# Patient Record
Sex: Male | Born: 1960 | Race: Black or African American | Hispanic: No | Marital: Married | Smoking: Never smoker
Health system: Southern US, Community
[De-identification: ages and names within clinical notes are randomized; demographics above are authoritative.]

## PROBLEM LIST (undated history)

## (undated) DIAGNOSIS — M1990 Unspecified osteoarthritis, unspecified site: Secondary | ICD-10-CM

## (undated) DIAGNOSIS — I428 Other cardiomyopathies: Secondary | ICD-10-CM

## (undated) DIAGNOSIS — I4891 Unspecified atrial fibrillation: Secondary | ICD-10-CM

## (undated) DIAGNOSIS — N182 Chronic kidney disease, stage 2 (mild): Secondary | ICD-10-CM

## (undated) DIAGNOSIS — F1010 Alcohol abuse, uncomplicated: Secondary | ICD-10-CM

## (undated) DIAGNOSIS — K219 Gastro-esophageal reflux disease without esophagitis: Secondary | ICD-10-CM

## (undated) DIAGNOSIS — M109 Gout, unspecified: Secondary | ICD-10-CM

## (undated) DIAGNOSIS — G459 Transient cerebral ischemic attack, unspecified: Secondary | ICD-10-CM

## (undated) HISTORY — DX: Other cardiomyopathies: I42.8

## (undated) HISTORY — DX: Unspecified atrial fibrillation: I48.91

## (undated) HISTORY — DX: Chronic kidney disease, stage 2 (mild): N18.2

---

## 1985-04-09 HISTORY — PX: OTHER SURGICAL HISTORY: SHX169

## 2011-06-14 ENCOUNTER — Inpatient Hospital Stay (HOSPITAL_COMMUNITY)
Admission: EM | Admit: 2011-06-14 | Discharge: 2011-06-19 | DRG: 308 | Disposition: A | Discharge status: Home / Self Care | Payer: MEDICAID | Attending: Internal Medicine | Admitting: Internal Medicine

## 2011-06-14 ENCOUNTER — Other Ambulatory Visit: Payer: Self-pay

## 2011-06-14 ENCOUNTER — Emergency Department (HOSPITAL_COMMUNITY): Payer: Self-pay

## 2011-06-14 ENCOUNTER — Encounter (HOSPITAL_COMMUNITY): Payer: Self-pay | Admitting: *Deleted

## 2011-06-14 DIAGNOSIS — I4891 Unspecified atrial fibrillation: Principal | ICD-10-CM | POA: Diagnosis present

## 2011-06-14 DIAGNOSIS — I359 Nonrheumatic aortic valve disorder, unspecified: Secondary | ICD-10-CM | POA: Diagnosis present

## 2011-06-14 DIAGNOSIS — Z888 Allergy status to other drugs, medicaments and biological substances status: Secondary | ICD-10-CM

## 2011-06-14 DIAGNOSIS — Z7901 Long term (current) use of anticoagulants: Secondary | ICD-10-CM

## 2011-06-14 DIAGNOSIS — Z6827 Body mass index (BMI) 27.0-27.9, adult: Secondary | ICD-10-CM

## 2011-06-14 DIAGNOSIS — I428 Other cardiomyopathies: Secondary | ICD-10-CM | POA: Diagnosis present

## 2011-06-14 DIAGNOSIS — I509 Heart failure, unspecified: Secondary | ICD-10-CM

## 2011-06-14 DIAGNOSIS — N182 Chronic kidney disease, stage 2 (mild): Secondary | ICD-10-CM | POA: Diagnosis present

## 2011-06-14 DIAGNOSIS — Z23 Encounter for immunization: Secondary | ICD-10-CM

## 2011-06-14 DIAGNOSIS — I5021 Acute systolic (congestive) heart failure: Secondary | ICD-10-CM | POA: Diagnosis present

## 2011-06-14 DIAGNOSIS — N289 Disorder of kidney and ureter, unspecified: Secondary | ICD-10-CM | POA: Diagnosis present

## 2011-06-14 DIAGNOSIS — Z7902 Long term (current) use of antithrombotics/antiplatelets: Secondary | ICD-10-CM

## 2011-06-14 DIAGNOSIS — F1010 Alcohol abuse, uncomplicated: Secondary | ICD-10-CM

## 2011-06-14 DIAGNOSIS — G459 Transient cerebral ischemic attack, unspecified: Secondary | ICD-10-CM | POA: Diagnosis present

## 2011-06-14 DIAGNOSIS — Z79899 Other long term (current) drug therapy: Secondary | ICD-10-CM

## 2011-06-14 DIAGNOSIS — J90 Pleural effusion, not elsewhere classified: Secondary | ICD-10-CM | POA: Diagnosis present

## 2011-06-14 HISTORY — DX: Alcohol abuse, uncomplicated: F10.10

## 2011-06-14 HISTORY — DX: Transient cerebral ischemic attack, unspecified: G45.9

## 2011-06-14 LAB — COMPREHENSIVE METABOLIC PANEL
ALT: 30 U/L (ref 0–53)
AST: 27 U/L (ref 0–37)
Albumin: 3.7 g/dL (ref 3.5–5.2)
Alkaline Phosphatase: 76 U/L (ref 39–117)
BUN: 23 mg/dL (ref 6–23)
CO2: 20 mEq/L (ref 19–32)
Calcium: 9.4 mg/dL (ref 8.4–10.5)
Chloride: 100 mEq/L (ref 96–112)
Creatinine, Ser: 1.57 mg/dL — ABNORMAL HIGH (ref 0.50–1.35)
GFR calc Af Amer: 58 mL/min — ABNORMAL LOW (ref >90)
GFR calc non Af Amer: 50 mL/min — ABNORMAL LOW (ref >90)
Glucose, Bld: 100 mg/dL — ABNORMAL HIGH (ref 70–99)
Potassium: 3.9 mEq/L (ref 3.5–5.1)
Sodium: 137 mEq/L (ref 135–145)
Total Bilirubin: 0.8 mg/dL (ref 0.3–1.2)
Total Protein: 7.3 g/dL (ref 6.0–8.3)

## 2011-06-14 LAB — DIFFERENTIAL
Basophils Absolute: 0.1 10*3/uL (ref 0.0–0.1)
Basophils Relative: 1 % (ref 0–1)
Eosinophils Absolute: 0.1 10*3/uL (ref 0.0–0.7)
Eosinophils Relative: 1 % (ref 0–5)
Lymphocytes Relative: 42 % (ref 12–46)
Lymphs Abs: 3.9 10*3/uL (ref 0.7–4.0)
Monocytes Absolute: 0.9 10*3/uL (ref 0.1–1.0)
Monocytes Relative: 10 % (ref 3–12)
Neutro Abs: 4.3 10*3/uL (ref 1.7–7.7)
Neutrophils Relative %: 46 % (ref 43–77)

## 2011-06-14 LAB — CBC
HCT: 44.8 % (ref 39.0–52.0)
Hemoglobin: 15.4 g/dL (ref 13.0–17.0)
MCH: 31.6 pg (ref 26.0–34.0)
MCHC: 34.4 g/dL (ref 30.0–36.0)
MCV: 91.8 fL (ref 78.0–100.0)
Platelets: 280 10*3/uL (ref 150–400)
RBC: 4.88 MIL/uL (ref 4.22–5.81)
RDW: 12.9 % (ref 11.5–15.5)
WBC: 9.2 10*3/uL (ref 4.0–10.5)

## 2011-06-14 LAB — PRO B NATRIURETIC PEPTIDE: Pro B Natriuretic peptide (BNP): 6596 pg/mL — ABNORMAL HIGH (ref 0–125)

## 2011-06-14 LAB — TROPONIN I: Troponin I: <0.3 ng/mL (ref <0.30)

## 2011-06-14 MED ORDER — DEXTROSE 5 % IV SOLN
500.0000 mg | Freq: Once | INTRAVENOUS | Status: AC
  Administered 2011-06-14: 500 mg via INTRAVENOUS
  Filled 2011-06-14: qty 500

## 2011-06-14 MED ORDER — DILTIAZEM HCL 100 MG IV SOLR
5.0000 mg/h | INTRAVENOUS | Status: DC
  Administered 2011-06-14: 5 mg/h via INTRAVENOUS

## 2011-06-14 MED ORDER — DILTIAZEM HCL 25 MG/5ML IV SOLN
INTRAVENOUS | Status: AC
  Filled 2011-06-14: qty 5

## 2011-06-14 MED ORDER — DEXTROSE 5 % IV SOLN
1.0000 g | Freq: Once | INTRAVENOUS | Status: AC
  Administered 2011-06-14: 1 g via INTRAVENOUS

## 2011-06-14 MED ORDER — DILTIAZEM HCL 50 MG/10ML IV SOLN
20.0000 mg | Freq: Once | INTRAVENOUS | Status: AC
  Administered 2011-06-14: 19:00:00 via INTRAVENOUS

## 2011-06-14 MED ORDER — DEXTROSE 5 % IV SOLN
INTRAVENOUS | Status: AC
  Filled 2011-06-14: qty 10

## 2011-06-14 NOTE — ED Notes (Signed)
Pt reported itching during the infusion of azithromycin. IV was stopped.

## 2011-06-14 NOTE — ED Notes (Signed)
CP x 1 week, numbness to right arm 3 days ago, productive cough, SOB, ankle swelling that started today

## 2011-06-14 NOTE — ED Notes (Signed)
Pt states itching stopped since zithromax was stopped.

## 2011-06-14 NOTE — ED Provider Notes (Addendum)
History  This chart was scribed for [name], MD by Bennett Scrape. This patient was seen in room [facility] and the patient's care was started at 6:52PM.  CSN: [id number]  Arrival date & time 06/14/11  6213   First MD Initiated Contact with Patient 06/14/11 1849      Chief Complaint  Patient presents with  . Chest Pain    Patient is a 51 y.o. male presenting with chest pain. The history is provided by the patient. No language interpreter was used.  Chest Pain The chest pain began 5 - 7 days ago. Chest pain occurs constantly. The chest pain is unchanged. The pain is associated with coughing. The quality of the pain is described as aching. The pain does not radiate. Chest pain is worsened by deep breathing. Primary symptoms include shortness of breath, cough and palpitations. Pertinent negatives for primary symptoms include no fever, no fatigue, no wheezing, no abdominal pain, no nausea, no vomiting and no dizziness.  The palpitations also occurred with shortness of breath. The palpitations did not occur with dizziness.  Associated symptoms include lower extremity edema and numbness.  Pertinent negatives for associated symptoms include no diaphoresis and no weakness. He tried nothing for the symptoms.  Pertinent negatives for past medical history include no CAD, no diabetes, no MI, no PE and no seizures.  Pertinent negatives for family medical history include: no CAD in family, no diabetes in family, no heart disease in family, no early MI in family and no PE in family.     David [name] is a 51 y.o. male who presents to the Emergency Department complaining of one week of gradual onset, gradually worsening, constant, non-radiating chest pain. Pt stated that it was originally coming from his productive cough of yellow and green sputum, because coughing make his chest and back hurt. He then became worried when he had a sudden onset SOB and palpitations 4 to 5 days ago and sudden onset  of bilateral ankle swelling this morning. He also reports concern over one episode of numbness to right arm and fingers that resolved on it's own after one hour 3 days ago. He reports that he has been taking night quil and equate for the cough with no improvement in symptoms. He denies any previous episodes of similar symptoms. He has no h/o chronic medical conditions. He is a daily alcohol user but denies smoking use.  Pt has no PCP.  History reviewed. No pertinent past medical history.  Past Surgical History  Procedure Date  . Knee surgery     History reviewed. No pertinent family history.  History  Substance Use Topics  . Smoking status: Never Smoker   . Smokeless tobacco: Not on file  . Alcohol Use: Yes     daily-beer      Review of Systems  Constitutional: Negative for fever, diaphoresis and fatigue.  HENT: Negative for congestion, sinus pressure and ear discharge.   Eyes: Negative for discharge.  Respiratory: Positive for cough and shortness of breath. Negative for wheezing.   Cardiovascular: Positive for chest pain and palpitations.  Gastrointestinal: Negative for nausea, vomiting, abdominal pain and diarrhea.  Genitourinary: Negative for frequency and hematuria.  Musculoskeletal: Negative for back pain.  Skin: Negative for rash.  Neurological: Positive for numbness. Negative for dizziness, seizures, weakness and headaches.  Hematological: Negative.   Psychiatric/Behavioral: Negative for hallucinations.    Allergies  Review of patient's allergies indicates not on file.  Home Medications  No current outpatient prescriptions  on file.  Triage Vitals: BP 166/120  Pulse 127  Temp(Src) 97.8 F (36.6 C) (Axillary)  Resp 22  Ht 5\' 11"  (1.803 m)  Wt 200 lb (90.719 kg)  BMI 27.89 kg/m2  SpO2 98%  Physical Exam  Nursing note and vitals reviewed. Constitutional: He is oriented to person, place, and time. He appears well-developed and well-nourished.  HENT:  Head:  Normocephalic and atraumatic.  Eyes: Conjunctivae and EOM are normal. No scleral icterus.  Neck: Neck supple. No thyromegaly present.  Cardiovascular: An irregular rhythm present. Tachycardia present.  Exam reveals no gallop and no friction rub.   No murmur heard. Pulmonary/Chest: Effort normal and breath sounds normal. No stridor. No respiratory distress. He has no wheezes. He has no rales. He exhibits no tenderness.  Abdominal: Soft. He exhibits no distension. There is no tenderness. There is no rebound.  Musculoskeletal: Normal range of motion. He exhibits edema (1+ edema in bilateral ankles).  Lymphadenopathy:    He has no cervical adenopathy.  Neurological: He is alert and oriented to person, place, and time. Coordination normal.  Skin: Skin is warm and dry. No rash noted. No erythema.  Psychiatric: He has a normal mood and affect. His behavior is normal.    ED Course  Procedures (including critical care time)  DIAGNOSTIC STUDIES: Oxygen Saturation is 98% on room air, normal by my interpretation.    COORDINATION OF CARE: 6:58Pm-Discussed treatment plan with pt and pt agreed to plan.    Labs Reviewed  COMPREHENSIVE METABOLIC PANEL - Abnormal; Notable for the following:    Glucose, Bld 100 (*)    Creatinine, Ser 1.57 (*)    GFR calc non Af Amer 50 (*)    GFR calc Af Amer 58 (*)    All other components within normal limits  CBC  DIFFERENTIAL  TROPONIN I  PRO B NATRIURETIC PEPTIDE    Dg Chest Port 1 View  06/14/2011  *RADIOLOGY REPORT*  Clinical Data: Chest pain for 1 week.  PORTABLE CHEST - 1 VIEW  Comparison: None.  Findings: 1913 hours.  There are low lung volumes with patchy bibasilar air space opacities.  There may be small bilateral pleural effusions.  No pneumothorax is evident.  Heart size and mediastinal contours are normal for the degree of inspiration.  No acute osseous findings are seen.  Telemetry leads overlie the chest.  IMPRESSION: Patchy bibasilar air space  opacities may reflect atelectasis, although are concerning for possible pneumonia.  Correlate clinically.  There may be small bilateral pleural effusions.  Original Report Authenticated By: Gerrianne Scale, M.D.     No diagnosis found.    MDM  Atrial fib   .[id number]  Date: 06/14/2011  Rate:135  Rhythm: atrial fibrillation  QRS Axis: normal  Intervals: normal  ST/T Wave abnormalities: nonspecific ST changes  Conduction Disutrbances:none  Narrative Interpretation:   Old EKG Reviewed: none available     The chart was scribed for me under my direct supervision.  I personally performed the history, physical, and medical decision making and all procedures in the evaluation of this patient.[name], MD 06/14/11 2130  [name], MD 06/14/11 2227

## 2011-06-15 ENCOUNTER — Inpatient Hospital Stay (HOSPITAL_COMMUNITY): Payer: Self-pay

## 2011-06-15 ENCOUNTER — Encounter (HOSPITAL_COMMUNITY): Payer: Self-pay | Admitting: Intensive Care

## 2011-06-15 DIAGNOSIS — N289 Disorder of kidney and ureter, unspecified: Secondary | ICD-10-CM

## 2011-06-15 DIAGNOSIS — I428 Other cardiomyopathies: Secondary | ICD-10-CM | POA: Diagnosis present

## 2011-06-15 DIAGNOSIS — I509 Heart failure, unspecified: Secondary | ICD-10-CM

## 2011-06-15 DIAGNOSIS — G459 Transient cerebral ischemic attack, unspecified: Secondary | ICD-10-CM | POA: Diagnosis present

## 2011-06-15 DIAGNOSIS — I4891 Unspecified atrial fibrillation: Principal | ICD-10-CM

## 2011-06-15 DIAGNOSIS — F1010 Alcohol abuse, uncomplicated: Secondary | ICD-10-CM

## 2011-06-15 DIAGNOSIS — I359 Nonrheumatic aortic valve disorder, unspecified: Secondary | ICD-10-CM

## 2011-06-15 LAB — PROTIME-INR
INR: 1.36 (ref 0.00–1.49)
Prothrombin Time: 17 seconds — ABNORMAL HIGH (ref 11.6–15.2)

## 2011-06-15 LAB — RAPID URINE DRUG SCREEN, HOSP PERFORMED
Amphetamines: NOT DETECTED
Barbiturates: NOT DETECTED
Benzodiazepines: NOT DETECTED
Cocaine: NOT DETECTED
Opiates: NOT DETECTED
Tetrahydrocannabinol: NOT DETECTED

## 2011-06-15 LAB — CARDIAC PANEL(CRET KIN+CKTOT+MB+TROPI)
CK, MB: 4.5 ng/mL — ABNORMAL HIGH (ref 0.3–4.0)
CK, MB: 4.5 ng/mL — ABNORMAL HIGH (ref 0.3–4.0)
CK, MB: 4.3 ng/mL — ABNORMAL HIGH (ref 0.3–4.0)
Relative Index: 3.1 — ABNORMAL HIGH (ref 0.0–2.5)
Relative Index: 2.7 — ABNORMAL HIGH (ref 0.0–2.5)
Relative Index: 2.6 — ABNORMAL HIGH (ref 0.0–2.5)
Total CK: 144 U/L (ref 7–232)
Total CK: 167 U/L (ref 7–232)
Total CK: 166 U/L (ref 7–232)
Troponin I: <0.3 ng/mL (ref <0.30)
Troponin I: <0.3 ng/mL (ref <0.30)
Troponin I: 0.32 ng/mL (ref <0.30)

## 2011-06-15 LAB — CBC
HCT: 42 % (ref 39.0–52.0)
Hemoglobin: 14.3 g/dL (ref 13.0–17.0)
MCH: 31 pg (ref 26.0–34.0)
MCHC: 34 g/dL (ref 30.0–36.0)
MCV: 91.1 fL (ref 78.0–100.0)
Platelets: 260 10*3/uL (ref 150–400)
RBC: 4.61 MIL/uL (ref 4.22–5.81)
RDW: 12.9 % (ref 11.5–15.5)
WBC: 10.2 10*3/uL (ref 4.0–10.5)

## 2011-06-15 LAB — LIPID PANEL
Cholesterol: 129 mg/dL (ref 0–200)
HDL: 45 mg/dL (ref >39)
LDL Cholesterol: 71 mg/dL (ref 0–99)
Total CHOL/HDL Ratio: 2.9 RATIO
Triglycerides: 67 mg/dL (ref <150)
VLDL: 13 mg/dL (ref 0–40)

## 2011-06-15 LAB — MAGNESIUM: Magnesium: 1.6 mg/dL (ref 1.5–2.5)

## 2011-06-15 LAB — T4, FREE: Free T4: 1.54 ng/dL (ref 0.80–1.80)

## 2011-06-15 LAB — TSH: TSH: 1.705 u[IU]/mL (ref 0.350–4.500)

## 2011-06-15 LAB — MRSA PCR SCREENING: MRSA by PCR: NEGATIVE

## 2011-06-15 MED ORDER — POTASSIUM CHLORIDE CRYS ER 20 MEQ PO TBCR
40.0000 meq | EXTENDED_RELEASE_TABLET | Freq: Two times a day (BID) | ORAL | Status: DC
  Administered 2011-06-15 – 2011-06-17 (×6): 40 meq via ORAL
  Filled 2011-06-15 (×7): qty 2

## 2011-06-15 MED ORDER — DILTIAZEM HCL 60 MG PO TABS
60.0000 mg | ORAL_TABLET | Freq: Two times a day (BID) | ORAL | Status: DC
  Administered 2011-06-15: 60 mg via ORAL
  Filled 2011-06-15: qty 1

## 2011-06-15 MED ORDER — FUROSEMIDE 10 MG/ML IJ SOLN
40.0000 mg | Freq: Two times a day (BID) | INTRAMUSCULAR | Status: DC
  Administered 2011-06-15 (×2): 40 mg via INTRAVENOUS
  Filled 2011-06-15 (×2): qty 4

## 2011-06-15 MED ORDER — ONDANSETRON HCL 4 MG/2ML IJ SOLN: 4.0000 mg | INTRAMUSCULAR | Status: DC | PRN

## 2011-06-15 MED ORDER — WARFARIN - PHARMACIST DOSING INPATIENT: Freq: Every day | Status: DC

## 2011-06-15 MED ORDER — VITAMIN B-1 100 MG PO TABS
100.0000 mg | ORAL_TABLET | Freq: Every day | ORAL | Status: DC
  Administered 2011-06-15 – 2011-06-19 (×5): 100 mg via ORAL
  Filled 2011-06-15 (×5): qty 1

## 2011-06-15 MED ORDER — ENOXAPARIN SODIUM 100 MG/ML ~~LOC~~ SOLN
1.0000 mg/kg | Freq: Two times a day (BID) | SUBCUTANEOUS | Status: DC
  Administered 2011-06-15 – 2011-06-19 (×9): 90 mg via SUBCUTANEOUS
  Filled 2011-06-15 (×13): qty 1

## 2011-06-15 MED ORDER — WARFARIN SODIUM 7.5 MG PO TABS
7.5000 mg | ORAL_TABLET | Freq: Once | ORAL | Status: AC
  Administered 2011-06-15: 7.5 mg via ORAL
  Filled 2011-06-15: qty 1

## 2011-06-15 MED ORDER — MAGNESIUM OXIDE 400 MG PO TABS
400.0000 mg | ORAL_TABLET | Freq: Every day | ORAL | Status: DC
  Administered 2011-06-15 – 2011-06-19 (×5): 400 mg via ORAL
  Filled 2011-06-15 (×5): qty 1

## 2011-06-15 MED ORDER — PNEUMOCOCCAL VAC POLYVALENT 25 MCG/0.5ML IJ INJ
0.5000 mL | INJECTION | INTRAMUSCULAR | Status: AC
  Administered 2011-06-16: 0.5 mL via INTRAMUSCULAR
  Filled 2011-06-15: qty 0.5

## 2011-06-15 MED ORDER — ADULT MULTIVITAMIN W/MINERALS CH
1.0000 | ORAL_TABLET | Freq: Every day | ORAL | Status: DC
  Administered 2011-06-15 – 2011-06-19 (×5): 1 via ORAL
  Filled 2011-06-15 (×5): qty 1

## 2011-06-15 MED ORDER — WARFARIN VIDEO
Freq: Once | Status: AC
  Administered 2011-06-15: 14:00:00

## 2011-06-15 MED ORDER — ASPIRIN EC 81 MG PO TBEC: 81.0000 mg | DELAYED_RELEASE_TABLET | Freq: Every day | ORAL | Status: DC

## 2011-06-15 MED ORDER — LORAZEPAM 2 MG/ML IJ SOLN: 1.0000 mg | Freq: Four times a day (QID) | INTRAMUSCULAR | Status: DC | PRN

## 2011-06-15 MED ORDER — ACETAMINOPHEN 325 MG PO TABS: 650.0000 mg | ORAL_TABLET | ORAL | Status: DC | PRN

## 2011-06-15 MED ORDER — DILTIAZEM HCL 30 MG PO TABS: 30.0000 mg | ORAL_TABLET | Freq: Four times a day (QID) | ORAL | Status: DC

## 2011-06-15 MED ORDER — WARFARIN SODIUM 7.5 MG PO TABS: 7.5000 mg | ORAL_TABLET | Freq: Once | ORAL | Status: DC

## 2011-06-15 MED ORDER — COUMADIN BOOK
Freq: Once | Status: AC
  Administered 2011-06-15: 10:00:00
  Filled 2011-06-15: qty 1

## 2011-06-15 MED ORDER — CARVEDILOL 3.125 MG PO TABS
6.2500 mg | ORAL_TABLET | Freq: Two times a day (BID) | ORAL | Status: DC
  Administered 2011-06-15 – 2011-06-18 (×6): 6.25 mg via ORAL
  Filled 2011-06-15 (×6): qty 2

## 2011-06-15 MED ORDER — FUROSEMIDE 10 MG/ML IJ SOLN
40.0000 mg | Freq: Every day | INTRAMUSCULAR | Status: DC
  Administered 2011-06-16 – 2011-06-19 (×4): 40 mg via INTRAVENOUS
  Filled 2011-06-15 (×4): qty 4

## 2011-06-15 MED ORDER — LISINOPRIL 5 MG PO TABS
2.5000 mg | ORAL_TABLET | Freq: Every day | ORAL | Status: DC
  Administered 2011-06-16 – 2011-06-19 (×4): 2.5 mg via ORAL
  Filled 2011-06-15 (×6): qty 1

## 2011-06-15 NOTE — H&P (Signed)
PCP:   No primary provider on file.   Chief Complaint:  Progressive shortness of breath x 5 days  HPI: David [name] is an 51 y.o. male.   Denies chronic medical problems denies medical followup for the past 25 years; admits to a 40 ounce of beer daily, NyQuil when necessary when he has flulike-symptoms.  Developed what he felt was a chest cold with coughing and back pain about a 10 days ago, self medicated with NyQuil but it did not really improve; cough is productive of yellow-green sputum; I 5 days ago he started having shortness of breath when lying flat in bed, relieved by sitting up, recurring again whenever he went back to sleep. 3 days ago she had an episode of complete numbness of his right upper extremity, drooling from his mouth when he tried to drink water. Episode lasted about 5 minutes. He was not alone and did not try to assess his speech or try to further assess her swallowing. Had no choking no blurring of vision vision normal, no fall and no weakness of his legs This morning he noted for the first time significant swelling of his lower extremities.  Is noted episodic palpitations for the past 5 days.  Denies any fever  Continued marijuana and cocaine use about 8 years ago.  Patient came to the emergency room today was noted to be in A. fib with RVR, and started on a Cardizem drip.  Rewiew of Systems:  The patient denies anorexia, fever, weight loss,, vision loss, decreased hearing, hoarseness, chest pain, syncope, dyspnea on exertion, peripheral edema, balance deficits, hemoptysis, abdominal pain, melena, hematochezia, severe indigestion/heartburn, hematuria, incontinence, genital sores, muscle weakness, suspicious skin lesions, transient blindness, difficulty walking, depression, unusual weight change, abnormal bleeding, enlarged lymph nodes, angioedema, and breast masses.   History reviewed. No pertinent past medical history.  Past Surgical History  Procedure Date    . Knee surgery   . Knee surgery 1987    arthroscopic    Medications:  HOME MEDS: Prior to Admission medications   Not on File     Allergies:  Allergies  Allergen Reactions  . Azithromycin Itching    Reaction in ED to administration of azithromycin. Patient stated he started itching.    Social History:   reports that he has never smoked. He does not have any smokeless tobacco history on file. He reports that he drinks alcohol. He reports that he uses illicit drugs (Marijuana).  Family History: Family History  Problem Relation Age of Onset  . Hypertension Mother   . Hypertension Sister   . Hypertension Brother      Physical Exam: Filed Vitals:   06/14/11 2000 06/14/11 2100 06/14/11 2200 06/14/11 2343  BP: 144/95 115/82 137/98 110/87  Pulse: 64 73 73 97  Temp:    97.6 F (36.4 C)  TempSrc:    Oral  Resp: 25   20  Height:    5\' 11"  (1.803 m)  Weight:    88.8 kg (195 lb 12.3 oz)  SpO2: 99% 99% 98% 99%   Blood pressure 110/87, pulse 97, temperature 97.6 F (36.4 C), temperature source Oral, resp. rate 20, height 5\' 11"  (1.803 m), weight 88.8 kg (195 lb 12.3 oz), SpO2 99.00%.  GEN:  Pleasant middle-aged African American gentleman lying in the stretcher in no acute distress; cooperative with exam PSYCH:  alert and oriented x4; does  appear anxious; does not appear depressed; affect is appropriate HEENT: Mucous membranes pink and anicteric; PERRLA; EOM  intact; no cervical lymphadenopathy nor thyromegaly or carotid bruit; markedly distended JVD and external jugular veins. Breasts:: Not examined CHEST WALL: No tenderness CHEST: Normal respiration, bibasilar crackles HEART: Regularly regular rhythm no murmurs . BACK: No kyphosis or scoliosis; no CVA tenderness ABDOMEN: Obese, soft non-tender; no masses, no organomegaly, normal abdominal bowel sounds; no pannus; no intertriginous candida. Rectal Exam: Not done EXTREMITIES:   arthropathy of the hands and knees; 1+ edema  to mid lower leg. Genitalia: not examined PULSES: 2+ and symmetric SKIN: Normal hydration no rash or ulceration CNS: Cranial nerves 2-12 grossly intact no focal or lateralizing neurologic deficit   Labs & Imaging Results for orders placed during the hospital encounter of 06/14/11 (from the past 48 hour(s))  CBC     Status: Normal   Collection Time   06/14/11  7:24 PM      Component Value Range Comment   WBC 9.2  4.0 - 10.5 (K/uL)    RBC 4.88  4.22 - 5.81 (MIL/uL)    Hemoglobin 15.4  13.0 - 17.0 (g/dL)    HCT 40.9  81.1 - 91.4 (%)    MCV 91.8  78.0 - 100.0 (fL)    MCH 31.6  26.0 - 34.0 (pg)    MCHC 34.4  30.0 - 36.0 (g/dL)    RDW 78.2  95.6 - 21.3 (%)    Platelets 280  150 - 400 (K/uL)   DIFFERENTIAL     Status: Normal   Collection Time   06/14/11  7:24 PM      Component Value Range Comment   Neutrophils Relative 46  43 - 77 (%)    Neutro Abs 4.3  1.7 - 7.7 (K/uL)    Lymphocytes Relative 42  12 - 46 (%)    Lymphs Abs 3.9  0.7 - 4.0 (K/uL)    Monocytes Relative 10  3 - 12 (%)    Monocytes Absolute 0.9  0.1 - 1.0 (K/uL)    Eosinophils Relative 1  0 - 5 (%)    Eosinophils Absolute 0.1  0.0 - 0.7 (K/uL)    Basophils Relative 1  0 - 1 (%)    Basophils Absolute 0.1  0.0 - 0.1 (K/uL)   COMPREHENSIVE METABOLIC PANEL     Status: Abnormal   Collection Time   06/14/11  7:24 PM      Component Value Range Comment   Sodium 137  135 - 145 (mEq/L)    Potassium 3.9  3.5 - 5.1 (mEq/L)    Chloride 100  96 - 112 (mEq/L)    CO2 20  19 - 32 (mEq/L)    Glucose, Bld 100 (*) 70 - 99 (mg/dL)    BUN 23  6 - 23 (mg/dL)    Creatinine, Ser 0.86 (*) 0.50 - 1.35 (mg/dL)    Calcium 9.4  8.4 - 10.5 (mg/dL)    Total Protein 7.3  6.0 - 8.3 (g/dL)    Albumin 3.7  3.5 - 5.2 (g/dL)    AST 27  0 - 37 (U/L)    ALT 30  0 - 53 (U/L)    Alkaline Phosphatase 76  39 - 117 (U/L)    Total Bilirubin 0.8  0.3 - 1.2 (mg/dL)    GFR calc non Af Amer 50 (*) >90 (mL/min)    GFR calc Af Amer 58 (*) >90 (mL/min)   TROPONIN I      Status: Normal   Collection Time   06/14/11  7:24 PM  Component Value Range Comment   Troponin I <0.30  <0.30 (ng/mL)   PRO B NATRIURETIC PEPTIDE     Status: Abnormal   Collection Time   06/14/11  7:45 PM      Component Value Range Comment   Pro B Natriuretic peptide (BNP) 6596.0 (*) 0 - 125 (pg/mL)    Dg Chest Port 1 View  06/14/2011  *RADIOLOGY REPORT*  Clinical Data: Chest pain for 1 week.  PORTABLE CHEST - 1 VIEW  Comparison: None.  Findings: 1913 hours.  There are low lung volumes with patchy bibasilar air space opacities.  There may be small bilateral pleural effusions.  No pneumothorax is evident.  Heart size and mediastinal contours are normal for the degree of inspiration.  No acute osseous findings are seen.  Telemetry leads overlie the chest.  IMPRESSION: Patchy bibasilar air space opacities may reflect atelectasis, although are concerning for possible pneumonia.  Correlate clinically.  There may be small bilateral pleural effusions.  Original Report Authenticated By: Gerrianne Scale, M.D.      Assessment Present on Admission:  .Atrial fibrillation, new onset .CHF (congestive heart failure) .Alcohol abuse .TIA (transient ischemic attack)   PLAN: We'll start this gentleman on full anti-coagulation, continue Cardizem drip, 2-D echo, a thyroid  function, consult cardiology.  Will withhold antibiotics for now clinically does not seem to have a pneumonia this is more likely to be failure; give diuretics, but will withhold inhibitors for the time being until, no function improves.  CT scan of the head to rule out evidence of infarct.  Consider alcohol withdrawal protocol. Other plans as per orders.    Zayleigh Stroh 06/15/2011, 1:04 AM

## 2011-06-15 NOTE — Progress Notes (Signed)
*  PRELIMINARY RESULTS* Echocardiogram 2D Echocardiogram has been performed.  Conrad [place] 06/15/2011, 1:51 PM

## 2011-06-15 NOTE — Progress Notes (Signed)
Pt and his family watching pt ed video on coumadin and blood thinners

## 2011-06-15 NOTE — Consult Note (Signed)
CARDIOLOGY CONSULT NOTE  Patient ID: David [name] MRN: [id number] DOB/AGE: 12/07/1960 51 y.o.  Admit date: 06/14/2011 Referring Physician: PTH Primary PhysicianNo primary provider on file. Primary Cardiologist: New Reason for Consultation: New Onset Afib and CHF Active Problems:  Atrial fibrillation, new onset  CHF (congestive heart failure)  Alcohol abuse  TIA (transient ischemic attack)  Renal insufficiency  HPI: David [name] is a 51 year old male patient with no prior cardiac history, has not been seen by a physician in over 20 years. Presented to any pain emergency room with one-week complaint of shortness of breath, coughing, and congestion. This became progressive with associated PND and orthopnea. 3 days ago. He had a transient episode of right arm numbness and tingling along with some facial drooping on the right side of his mouth. Day of admission he noticed that his lower extremities had become swollen and cool. He came to the emergency room for evaluation and was found to be in atrial fibrillation with RVR. Heart rate was 120s 70s minute with a blood pressure 166/120. He was afebrile. The chest x-ray revealed patchy bibasilar airspace opacities may reflect atelectasis as are concerning for possible pneumonia. He was given a diltiazem bolus of 20 mg and started on a drip of 5 mg an hour. He was also started on Rocephin and Zithromax. ProBNP was elevated at 6596. He was given IV Lasix 40 mg times one and his diuresis. Approximately 2000 cc. He remains on IV Lasix 40 mg every 12 hours. He is also on Lovenox twice a day. He could not tell that his heart rate was irregular or how long he had been in an irregular heart rhythm. His main complaint was progressive shortness of breath, cough, and lower extremity edema. He has a history of alcohol abuse and is being treated prophylactically with thiamine, magnesium, and multivitamins. Echocardiogram has been ordered.  Review of systems complete and  found to be negative unless listed above   History reviewed. No pertinent past medical history.  Family History  Problem Relation Age of Onset  . Hypertension Mother   . Hypertension Sister   . Hypertension Brother     History   Social History  . Marital Status: Single    Spouse Name: N/A    Number of Children: N/A  . Years of Education: N/A   Occupational History  . Not on file.   Social History Main Topics  . Smoking status: Never Smoker   . Smokeless tobacco: Not on file  . Alcohol Use: Yes     daily-beer  . Drug Use: Yes    Special: Marijuana     last used on birthday  . Sexually Active: Yes    Birth Control/ Protection: None   Other Topics Concern  . Not on file   Social History Narrative   [place] with his sisterLaid off from [employer].    Past Surgical History  Procedure Date  . Knee surgery   . Knee surgery 1987    arthroscopic     No prescriptions prior to admission    Physical Exam: Blood pressure 151/98, pulse 88, temperature 98.2 F (36.8 C), temperature source Oral, resp. rate 32, height 5\' 11"  (1.803 m), weight 195 lb 12.3 oz (88.8 kg), SpO2 97.00%.    General: Well developed, well nourished, in no acute distress Head: Eyes PERRLA, No xanthomas.   Normal cephalic and atramatic  Lungs: Clear bilaterally to auscultation and percussion.No wheezes or crackles. Heart: HRIR S1 S2, .  Pulses are 2+ & equal.            No carotid bruit. Mild  JVD.  No abdominal bruits. No femoral bruits. Abdomen: Bowel sounds are positive, abdomen soft and non-tender without masses or                  Hernia's noted. Msk:  Back normal, normal gait. Normal strength and tone for age. Extremities: No clubbing, cyanosis or edema.  DP diminished on the right, 1+ on the left. Neuro: Alert and oriented X 3. Psych:  Good affect, responds appropriately Labs:   Lab Results  Component Value Date   WBC 9.2 06/14/2011   HGB 15.4 06/14/2011   HCT 44.8 06/14/2011    MCV 91.8 06/14/2011   PLT 280 06/14/2011     Lab 06/14/11 1924  NA 137  K 3.9  CL 100  CO2 20  BUN 23  CREATININE 1.57*  CALCIUM 9.4  PROT 7.3  BILITOT 0.8  ALKPHOS 76  ALT 30  AST 27  GLUCOSE 100*   Lab Results  Component Value Date   TROPONINI <0.30 06/14/2011     Pro BNP: 6596.0   Radiology: Ct Head Wo Contrast  06/15/2011  *RADIOLOGY REPORT*  Clinical Data: TIA, lost feeling in right side for approximately 5 minutes 3 days ago, history atrial fibrillation  CT HEAD WITHOUT CONTRAST  Technique:  Contiguous axial images were obtained from the base of the skull through the vertex without contrast.  Comparison: None.  Findings: Normal ventricular morphology. No midline shift or mass effect. Normal appearance of brain parenchyma. No intracranial hemorrhage, mass lesion, or acute infarction. Visualized paranasal sinuses and mastoid air cells clear. Bones unremarkable.  IMPRESSION: Normal exam.  Original Report Authenticated By: Lollie Marrow, M.D.   Dg Chest Port 1 View  06/14/2011  *RADIOLOGY REPORT*  Clinical Data: Chest pain for 1 week.  PORTABLE CHEST - 1 VIEW  Comparison: None.  Findings: 1913 hours.  There are low lung volumes with patchy bibasilar air space opacities.  There may be small bilateral pleural effusions.  No pneumothorax is evident.  Heart size and mediastinal contours are normal for the degree of inspiration.  No acute osseous findings are seen.  Telemetry leads overlie the chest.  IMPRESSION: Patchy bibasilar air space opacities may reflect atelectasis, although are concerning for possible pneumonia.  Correlate clinically.  There may be small bilateral pleural effusions.  Original Report Authenticated By: Gerrianne Scale, M.D.   ZOX:WRUEAV fib with PVC's, T-wave inversion V5-V6.   ASSESSMENT AND PLAN:  1. Atrial fibrillation with RVR: Patient currently is rate controlled on IV Cardizem drip and tolerating it well concerning his blood pressure. Echocardiogram is pending.  CHF, probably related to elevated heart rate in the setting of A. fib RVR. He is diuresing very well. We will transition to by mouth Cardizem this am. CHADS  Score 2 (Hypertension and CHF). He is currently on Coumadin per pharmacy. There are concerns about this in the setting of alcoholism. Coumadin should be used cautiously He will need to be very diligent on his medications and have close followup with PT, INR. Awaiting TSH results. Cardiovascular risk factor evaluation will also be completed by addressing lipids and LFTs.  2. CHF: Pro BNP was elevated and has diuresed approximately 2 L since admission with IV diuretics. Creatinine 1.57. Chest x-ray was not specific for CHF. He is breathing better and without any further complaints of PND or orthopnea. We will diuresis  him on IV Lasix for another 24 hours and would transition to by mouth Lasix. Thereafter. Echocardiogram will give [place] a better indication for cardiomyopathy and LV function. Cardiac enzymes are continued to be cycled. Initial enzymes are negative.  3. ETOH ABUSE: Recommend social services involvement to discuss alcohol cessation support groups, Alcoholics Anonymous, vs. in the patient treatment.  [facility] 06/15/2011, 8:45 AM Co-Sign MD   Attending note:  Patient seen and examined. Reviewed available records as well as databased recorded above by David [name]. In summary David [name] is a 51 year old male with no regular medical followup, presenting with a one-week history of cough, chest congestion, and ultimately shortness of breath with orthopnea associated with sense of palpitations.  He was encouraged to seek medical attention by his mother and sister, noted to be in rapid atrial fibrillation on evaluation in the emergency department, also hypertensive. He has been admitted for further management, treated with intravenous Lasix with Pro BNP 6596, chest x-ray demonstrating patchy airspace disease at the bases. Heart rate has  come down with intravenous diltiazem. Cardiac markers argue against an acute coronary syndrome. ECG reviewed showing atrial fibrillation with LVH, lateral ST-T wave changes, possibly repolarization abnormalities.  On examination now he is comfortable. Afebrile, heart rate in the 80s in atrial fibrillation, most recent blood pressure 117/79, oxygen saturation 96% on room air. Neck shows no elevated JVP, lungs are clear with decreased sounds at the bases, cardiac exam with irregularly irregular rhythm, soft systolic murmur, no obvious S3 or rub, extremities without pitting edema.  Can lab work reviewed showing potassium 3.9, BUN 23, creatinine 1.5, AST 27, ALT 30, troponin I negative, LDL 71, hemoglobin 14.3, platelets 260, INR 1.3, UDS negative. Head CT scan was normal.  Discussed with patient and his mother. New diagnosis of atrial fibrillation, onset unclear but possibly within the last week associated with symptoms of heart failure. Patient has elevated blood pressure, possible CHADS2 score of 2. Echocardiogram is pending for further evaluation of LV function. Complicating the picture is regular alcohol abuse. He has been started on Coumadin by Dr. Sherrie Mustache. Recommend changing from intravenous Cardizem to oral Cardizem 30 mg every 6 hours, consolidating to daily dose depending on heart rate control. He may convert spontaneously to sinus rhythm with rate control, if not he may ultimately require an elective DCCV. He would need to be on Coumadin for this, however it will be imperative for him to comply with alcohol reduction and optimally cessation, as well as establishing regular medical followup. Would suggest case manager consultation for financial assistance measures. We will follow up only echocardiogram as well. If he has evidence of left ventricular dysfunction, further ischemic workup and medication adjustments will also be necessary. TSH ordered.  Jonelle Sidle, M.D., F.A.C.C.

## 2011-06-15 NOTE — Progress Notes (Signed)
Subjective: The patient has no complaints of chest pain, shortness of breath, or palpitations. He denies right arm weakness or numbness. He denies difficulty swallowing, difficulty chewing, or difficulty speaking.  Objective: Vital signs in last 24 hours: Filed Vitals:   06/15/11 0645 06/15/11 0700 06/15/11 0800 06/15/11 0819  BP: 133/85 109/68 151/98   Pulse: 81 66 79 88  Temp:   98.2 F (36.8 C)   TempSrc:      Resp: 27 30 38 32  Height:      Weight:      SpO2: 98% 94% 97%     Intake/Output Summary (Last 24 hours) at 06/15/11 0829 Last data filed at 06/15/11 0800  Gross per 24 hour  Intake    360 ml  Output   2100 ml  Net  -1740 ml    Weight change:   Physical exam: General: Pleasant 51 year old African American man sitting up in bed, in no acute distress. Lungs: Occasional crackles auscultated in the lower lung fields bilaterally. Heart: Irregular, irregular, with borderline tachycardia. Abdomen: Positive bowel sounds, soft, nontender, nondistended. Extremity: 1+ bilateral lower extremity edema. Neurologic: He is alert and oriented x3. No tremulousness. Cranial nerves II through XII are intact. Strength is 5 over 5 throughout. Sensation is grossly intact. No pronator drift.  Lab Results: Basic Metabolic Panel:  Basename 06/15/11 0440 06/14/11 1924  NA -- 137  K -- 3.9  CL -- 100  CO2 -- 20  GLUCOSE -- 100*  BUN -- 23  CREATININE -- 1.57*  CALCIUM -- 9.4  MG 1.6 --  PHOS -- --   Liver Function Tests:  Basename 06/14/11 1924  AST 27  ALT 30  ALKPHOS 76  BILITOT 0.8  PROT 7.3  ALBUMIN 3.7   No results found for this basename: LIPASE:2,AMYLASE:2 in the last 72 hours No results found for this basename: AMMONIA:2 in the last 72 hours CBC:  Basename 06/14/11 1924  WBC 9.2  NEUTROABS 4.3  HGB 15.4  HCT 44.8  MCV 91.8  PLT 280   Cardiac Enzymes:  Basename 06/14/11 1924  CKTOTAL --  CKMB --  CKMBINDEX --  TROPONINI <0.30   BNP:  Basename  06/14/11 1945  PROBNP 6596.0*   D-Dimer: No results found for this basename: DDIMER:2 in the last 72 hours CBG: No results found for this basename: GLUCAP:6 in the last 72 hours Hemoglobin A1C: No results found for this basename: HGBA1C in the last 72 hours Fasting Lipid Panel: No results found for this basename: CHOL,HDL,LDLCALC,TRIG,CHOLHDL,LDLDIRECT in the last 72 hours Thyroid Function Tests: No results found for this basename: TSH,T4TOTAL,FREET4,T3FREE,THYROIDAB in the last 72 hours Anemia Panel: No results found for this basename: VITAMINB12,FOLATE,FERRITIN,TIBC,IRON,RETICCTPCT in the last 72 hours Coagulation:  Basename 06/15/11 0149  LABPROT 17.0*  INR 1.36   Urine Drug Screen: Drugs of Abuse     Component Value Date/Time   LABOPIA NONE DETECTED 06/15/2011 0212   COCAINSCRNUR NONE DETECTED 06/15/2011 0212   LABBENZ NONE DETECTED 06/15/2011 0212   AMPHETMU NONE DETECTED 06/15/2011 0212   THCU NONE DETECTED 06/15/2011 0212   LABBARB NONE DETECTED 06/15/2011 0212    Alcohol Level: No results found for this basename: ETH:2 in the last 72 hours Urinalysis: No results found for this basename: COLORURINE:2,APPERANCEUR:2,LABSPEC:2,PHURINE:2,GLUCOSEU:2,HGBUR:2,BILIRUBINUR:2,KETONESUR:2,PROTEINUR:2,UROBILINOGEN:2,NITRITE:2,LEUKOCYTESUR:2 in the last 72 hours Misc. Labs:   Micro: Recent Results (from the past 240 hour(s))  MRSA PCR SCREENING     Status: Normal   Collection Time   06/14/11 11:56 PM  Component Value Range Status Comment   MRSA by PCR NEGATIVE  NEGATIVE  Final     Studies/Results: Ct Head Wo Contrast  06/15/2011  *RADIOLOGY REPORT*  Clinical Data: TIA, lost feeling in right side for approximately 5 minutes 3 days ago, history atrial fibrillation  CT HEAD WITHOUT CONTRAST  Technique:  Contiguous axial images were obtained from the base of the skull through the vertex without contrast.  Comparison: None.  Findings: Normal ventricular morphology. No midline shift or  mass effect. Normal appearance of brain parenchyma. No intracranial hemorrhage, mass lesion, or acute infarction. Visualized paranasal sinuses and mastoid air cells clear. Bones unremarkable.  IMPRESSION: Normal exam.  Original Report Authenticated By: Lollie Marrow, M.D.   Dg Chest Port 1 View  06/14/2011  *RADIOLOGY REPORT*  Clinical Data: Chest pain for 1 week.  PORTABLE CHEST - 1 VIEW  Comparison: None.  Findings: 1913 hours.  There are low lung volumes with patchy bibasilar air space opacities.  There may be small bilateral pleural effusions.  No pneumothorax is evident.  Heart size and mediastinal contours are normal for the degree of inspiration.  No acute osseous findings are seen.  Telemetry leads overlie the chest.  IMPRESSION: Patchy bibasilar air space opacities may reflect atelectasis, although are concerning for possible pneumonia.  Correlate clinically.  There may be small bilateral pleural effusions.  Original Report Authenticated By: Gerrianne Scale, M.D.    Medications: I have reviewed the patient's current medications.  Assessment: Active Problems:  Atrial fibrillation, new onset  CHF (congestive heart failure)  Alcohol abuse  TIA (transient ischemic attack)  Renal insufficiency   1. Newly diagnosed atrial fibrillation. His heart rate is controlled on the Cardizem drip.  Pulmonary opacities, likely secondary to pulmonary edema and less likely to pneumonia. His pro BNP is greater than 6000. He has congestive heart failure until proven otherwise. 2-D echocardiogram has been ordered to evaluate systolic versus diastolic congestive heart failure.  Renal insufficiency. His creatinine was 1.57 on admission. It is unclear if this is new-onset or chronic.  History of right arm weakness and numbness, now completely resolved. The CT of his head revealed no acute findings. The patient may have had a TIA versus a neuropathic abnormality from alcohol abuse.  Alcohol abuse. The patient  says that he has gone several days without alcohol use. He has no history of alcohol withdrawal syndrome or DTs.  Plan:   1. Full dose Lovenox and Coumadin has been started. Cardiology has been consulted. 2. Cardizem drip will be continued until cardiology sees the patient. Recommendations will follow. We'll keep him on a Cardizem drip for now. I foresee that the patient could probably be transitioned to oral Cardizem. 3. For further evaluation, cardiac enzymes will be ordered. 2-D echocardiogram has been ordered and is pending. His thyroid function is being assessed with TSH and free T4 which are also pending. We'll also order carotid ultrasound. 4. Will add vitamin therapy with thiamine and multivitamin. We'll also add when necessary Ativan. Will consult the social worker for alcohol abuse. 5. Will add magnesium oxide daily as his magnesium level is borderline low. 6. We'll discontinue the aspirin for now. 7. Will order another EKG for in the morning.   LOS: 1 day   Sheri Prows 06/15/2011, 8:29 AM

## 2011-06-15 NOTE — Progress Notes (Signed)
ANTICOAGULATION CONSULT NOTE - Initial Consult  Pharmacy Consult for  Enoxaparin and Warfarin Indication: atrial fibrillation  Allergies  Allergen Reactions  . Azithromycin Itching    Reaction in ED to administration of azithromycin. Patient stated he started itching.    Patient Measurements: Height: 5\' 11"  (180.3 cm) Weight: 195 lb 12.3 oz (88.8 kg) IBW/kg (Calculated) : 75.3  Heparin Dosing Weight:  Vital Signs: Temp: 98.5 F (36.9 C) (03/08 0400) Temp src: Oral (03/08 0400) BP: 109/68 mmHg (03/08 0700) Pulse Rate: 66  (03/08 0700)  Labs:  Basename 06/15/11 0149 06/14/11 1924  HGB -- 15.4  HCT -- 44.8  PLT -- 280  APTT -- --  LABPROT 17.0* --  INR 1.36 --  HEPARINUNFRC -- --  CREATININE -- 1.57*  CKTOTAL -- --  CKMB -- --  TROPONINI -- <0.30   Estimated Creatinine Clearance: 60.6 ml/min (by C-G formula based on Cr of 1.57).  Medical History: History reviewed. No pertinent past medical history.  Medications:  Scheduled:    . aspirin EC  81 mg Oral Daily  . azithromycin  500 mg Intravenous Once  . cefTRIAXone (ROCEPHIN) 1 GM IVPB  1 g Intravenous Once  . cefTRIAXone (ROCEPHIN) IVPB 1 gram/50 mL D5W      . diltiazem  20 mg Intravenous Once  . enoxaparin (LOVENOX) injection  1 mg/kg Subcutaneous Q12H  . furosemide  40 mg Intravenous Q12H  . potassium chloride  40 mEq Oral BID  . warfarin  7.5 mg Oral Once  . Warfarin - Pharmacist Dosing Inpatient   Does not apply q1800    Assessment:  Bridge anticoagulation therapy with full dose Lovenox and Warfarin.  Goal of Therapy:   INR 2-3   Plan:  Lovenox 1 mg per kg every 12 hours. Daily INR . Warfarin 7.5 mg today.   Gilman Buttner, [place] J 06/15/2011,7:36 AM

## 2011-06-15 NOTE — Progress Notes (Signed)
Echocardiogram reviewed - see full report. LVEF is approximately 20-25% with diffuse hypokinesis (suspect nonischemic cardiomyopathy at this point with alcohol use and atrial fibrillation), also RV dysfunction, mild aortic root dilatation with moderate aortic regurgitation. Would therefore recommend changing medical therapy and adjusting over the weekend. Will stop Cardizem and initiate Coreg 6.25 mg BID. Also try low dose Lisinopril 2.5 mg daily - watch renal function. Can stay on Coumadin for now. General plan will be to stabilize on medical therapy and eventually evaluate ischemia with Myoview study. Can then decide about DCCV later and whether heart catheterization will be required in the short term.  Jonelle Sidle, M.D., F.A.C.C.

## 2011-06-16 ENCOUNTER — Encounter (HOSPITAL_COMMUNITY): Payer: Self-pay | Admitting: Internal Medicine

## 2011-06-16 LAB — BASIC METABOLIC PANEL
BUN: 29 mg/dL — ABNORMAL HIGH (ref 6–23)
CO2: 26 mEq/L (ref 19–32)
Calcium: 8.9 mg/dL (ref 8.4–10.5)
Chloride: 102 mEq/L (ref 96–112)
Creatinine, Ser: 1.78 mg/dL — ABNORMAL HIGH (ref 0.50–1.35)
GFR calc Af Amer: 50 mL/min — ABNORMAL LOW (ref >90)
GFR calc non Af Amer: 43 mL/min — ABNORMAL LOW (ref >90)
Glucose, Bld: 102 mg/dL — ABNORMAL HIGH (ref 70–99)
Potassium: 4.3 mEq/L (ref 3.5–5.1)
Sodium: 137 mEq/L (ref 135–145)

## 2011-06-16 LAB — CARDIAC PANEL(CRET KIN+CKTOT+MB+TROPI)
CK, MB: 3.8 ng/mL (ref 0.3–4.0)
Relative Index: 2.3 (ref 0.0–2.5)
Total CK: 162 U/L (ref 7–232)
Troponin I: <0.3 ng/mL (ref <0.30)

## 2011-06-16 LAB — CBC
HCT: 41 % (ref 39.0–52.0)
Hemoglobin: 13.9 g/dL (ref 13.0–17.0)
MCH: 31.2 pg (ref 26.0–34.0)
MCHC: 33.9 g/dL (ref 30.0–36.0)
MCV: 92.1 fL (ref 78.0–100.0)
Platelets: 248 10*3/uL (ref 150–400)
RBC: 4.45 MIL/uL (ref 4.22–5.81)
RDW: 13 % (ref 11.5–15.5)
WBC: 8.3 10*3/uL (ref 4.0–10.5)

## 2011-06-16 LAB — PROTIME-INR
INR: 1.18 (ref 0.00–1.49)
Prothrombin Time: 15.2 seconds (ref 11.6–15.2)

## 2011-06-16 LAB — TSH: TSH: 2.579 u[IU]/mL (ref 0.350–4.500)

## 2011-06-16 LAB — PRO B NATRIURETIC PEPTIDE: Pro B Natriuretic peptide (BNP): 2117 pg/mL — ABNORMAL HIGH (ref 0–125)

## 2011-06-16 MED ORDER — WARFARIN SODIUM 7.5 MG PO TABS
7.5000 mg | ORAL_TABLET | Freq: Once | ORAL | Status: AC
  Administered 2011-06-16: 7.5 mg via ORAL
  Filled 2011-06-16: qty 1

## 2011-06-16 MED ORDER — DIGOXIN 125 MCG PO TABS
0.1250 mg | ORAL_TABLET | Freq: Every day | ORAL | Status: DC
  Administered 2011-06-16 – 2011-06-19 (×4): 0.125 mg via ORAL
  Filled 2011-06-16 (×4): qty 1

## 2011-06-16 MED ORDER — FAMOTIDINE 20 MG PO TABS
20.0000 mg | ORAL_TABLET | Freq: Every day | ORAL | Status: DC
  Administered 2011-06-16 – 2011-06-19 (×4): 20 mg via ORAL
  Filled 2011-06-16 (×4): qty 1

## 2011-06-16 MED ORDER — DILTIAZEM HCL ER COATED BEADS 120 MG PO CP24
120.0000 mg | ORAL_CAPSULE | Freq: Every day | ORAL | Status: DC
  Administered 2011-06-16 – 2011-06-19 (×4): 120 mg via ORAL
  Filled 2011-06-16 (×4): qty 1

## 2011-06-16 NOTE — Progress Notes (Signed)
ANTICOAGULATION CONSULT NOTE   Pharmacy Consult for  Enoxaparin and Warfarin Indication: atrial fibrillation  Allergies  Allergen Reactions  . Azithromycin Itching    Reaction in ED to administration of azithromycin. Patient stated he started itching.   Patient Measurements: Height: 5\' 11"  (180.3 cm) Weight: 195 lb 5.2 oz (88.6 kg) IBW/kg (Calculated) : 75.3    Vital Signs: Temp: 98.1 F (36.7 C) (03/09 0400) Temp src: Oral (03/09 0400) BP: 122/90 mmHg (03/09 0900) Pulse Rate: 52  (03/09 0900)  Labs:  Basename 06/16/11 0215 06/15/11 1957 06/15/11 1440 06/15/11 0826 06/15/11 0149 06/14/11 1924  HGB 13.9 -- -- 14.3 -- --  HCT 41.0 -- -- 42.0 -- 44.8  PLT 248 -- -- 260 -- 280  APTT -- -- -- -- -- --  LABPROT 15.2 -- -- -- 17.0* --  INR 1.18 -- -- -- 1.36 --  HEPARINUNFRC -- -- -- -- -- --  CREATININE 1.78* -- -- -- -- 1.57*  CKTOTAL 162 166 167 -- -- --  CKMB 3.8 4.3* 4.5* -- -- --  TROPONINI <0.30 0.32* <0.30 -- -- --   Estimated Creatinine Clearance: 53.5 ml/min (by C-G formula based on Cr of 1.78).  Medical History: Past Medical History  Diagnosis Date  . Acute systolic congestive heart failure 06/16/2011    Newly diagnosed. Ejection fraction 20-25%.  . Atrial fibrillation with RVR March 2013    Newly diagnosed  . Pulmonary hypertension March 2013    39 mmHg   Medications:  Scheduled:     . carvedilol  6.25 mg Oral BID WC  . digoxin  0.125 mg Oral Daily  . diltiazem  120 mg Oral Daily  . enoxaparin (LOVENOX) injection  1 mg/kg Subcutaneous Q12H  . famotidine  20 mg Oral Daily  . furosemide  40 mg Intravenous Daily  . lisinopril  2.5 mg Oral Daily  . magnesium oxide  400 mg Oral Daily  . mulitivitamin with minerals  1 tablet Oral Daily  . pneumococcal 23 valent vaccine  0.5 mL Intramuscular Tomorrow-1000  . potassium chloride  40 mEq Oral BID  . thiamine  100 mg Oral Daily  . warfarin  7.5 mg Oral ONCE-1800  . warfarin  7.5 mg Oral ONCE-1800  .  warfarin   Does not apply Once  . Warfarin - Pharmacist Dosing Inpatient   Does not apply q1800  . DISCONTD: diltiazem  30 mg Oral Q6H  . DISCONTD: diltiazem  60 mg Oral Q12H  . DISCONTD: furosemide  40 mg Intravenous Q12H    Assessment: Bridge anticoagulation therapy with full dose Lovenox and Warfarin x 5 days and until INR > 2  Goal of Therapy:   INR 2-3   Plan:  Lovenox 1 mg per kg every 12 hours. Daily INR . Warfarin 7.5 mg today.   Margo Aye, Laurita Peron A 06/16/2011,10:29 AM

## 2011-06-16 NOTE — Progress Notes (Signed)
Subjective: Patient says that he is breathing a little bit better. He hasn't been sleeping well because of the multiple interruptions during the night. He has no complaints of chest pain, but does feel his heart racing at times.  Objective: Vital signs in last 24 hours: Filed Vitals:   06/16/11 0300 06/16/11 0400 06/16/11 0500 06/16/11 0600  BP: 140/101 119/105 124/99 131/87  Pulse: 40 50 73 92  Temp:  98.1 F (36.7 C)    TempSrc:  Oral    Resp: 30 35 36 35  Height:      Weight:   88.6 kg (195 lb 5.2 oz)   SpO2: 98% 98% 98% 99%    Intake/Output Summary (Last 24 hours) at 06/16/11 1610 Last data filed at 06/15/11 2200  Gross per 24 hour  Intake    609 ml  Output   1600 ml  Net   -991 ml    Weight change: -2.119 kg (-4 lb 10.8 oz)  Physical exam: General: Pleasant 51 year old African American man sitting up in bed, in no acute distress. Lungs: Decreased crackles auscultated in the lower lung fields bilaterally. Heart: Irregular, irregular, with tachycardia. Abdomen: Positive bowel sounds, soft, nontender, nondistended. Extremity: Trace and bilateral lower extremity edema. Neurologic: He is alert and oriented x3. No tremulousness. Cranial nerves II through XII are intact. Strength is 5 over 5 throughout. Sensation is grossly intact. No pronator drift.  Lab Results: Basic Metabolic Panel:  Basename 06/16/11 0215 06/15/11 0440 06/14/11 1924  NA 137 -- 137  K 4.3 -- 3.9  CL 102 -- 100  CO2 26 -- 20  GLUCOSE 102* -- 100*  BUN 29* -- 23  CREATININE 1.78* -- 1.57*  CALCIUM 8.9 -- 9.4  MG -- 1.6 --  PHOS -- -- --   Liver Function Tests:  Basename 06/14/11 1924  AST 27  ALT 30  ALKPHOS 76  BILITOT 0.8  PROT 7.3  ALBUMIN 3.7   No results found for this basename: LIPASE:2,AMYLASE:2 in the last 72 hours No results found for this basename: AMMONIA:2 in the last 72 hours CBC:  Basename 06/16/11 0215 06/15/11 0826 06/14/11 1924  WBC 8.3 10.2 --  NEUTROABS -- -- 4.3    HGB 13.9 14.3 --  HCT 41.0 42.0 --  MCV 92.1 91.1 --  PLT 248 260 --   Cardiac Enzymes:  Basename 06/16/11 0215 06/15/11 1957 06/15/11 1440  CKTOTAL 162 166 167  CKMB 3.8 4.3* 4.5*  CKMBINDEX -- -- --  TROPONINI <0.30 0.32* <0.30   BNP:  Basename 06/16/11 0215 06/14/11 1945  PROBNP 2117.0* 6596.0*   D-Dimer: No results found for this basename: DDIMER:2 in the last 72 hours CBG: No results found for this basename: GLUCAP:6 in the last 72 hours Hemoglobin A1C: No results found for this basename: HGBA1C in the last 72 hours Fasting Lipid Panel:  Basename 06/15/11 0827  CHOL 129  HDL 45  LDLCALC 71  TRIG 67  CHOLHDL 2.9  LDLDIRECT --   Thyroid Function Tests:  Basename 06/15/11 0703 06/15/11 0149  TSH -- 1.705  T4TOTAL -- --  FREET4 1.54 --  T3FREE -- --  THYROIDAB -- --   Anemia Panel: No results found for this basename: VITAMINB12,FOLATE,FERRITIN,TIBC,IRON,RETICCTPCT in the last 72 hours Coagulation:  Basename 06/16/11 0215 06/15/11 0149  LABPROT 15.2 17.0*  INR 1.18 1.36   Urine Drug Screen: Drugs of Abuse     Component Value Date/Time   LABOPIA NONE DETECTED 06/15/2011 0212   COCAINSCRNUR NONE  DETECTED 06/15/2011 0212   LABBENZ NONE DETECTED 06/15/2011 0212   AMPHETMU NONE DETECTED 06/15/2011 0212   THCU NONE DETECTED 06/15/2011 0212   LABBARB NONE DETECTED 06/15/2011 0212    Alcohol Level: No results found for this basename: ETH:2 in the last 72 hours Urinalysis: No results found for this basename: COLORURINE:2,APPERANCEUR:2,LABSPEC:2,PHURINE:2,GLUCOSEU:2,HGBUR:2,BILIRUBINUR:2,KETONESUR:2,PROTEINUR:2,UROBILINOGEN:2,NITRITE:2,LEUKOCYTESUR:2 in the last 72 hours Misc. Labs:   Micro: Recent Results (from the past 240 hour(s))  MRSA PCR SCREENING     Status: Normal   Collection Time   06/14/11 11:56 PM      Component Value Range Status Comment   MRSA by PCR NEGATIVE  NEGATIVE  Final     Studies/Results: Ct Head Wo Contrast  06/15/2011  *RADIOLOGY  REPORT*  Clinical Data: TIA, lost feeling in right side for approximately 5 minutes 3 days ago, history atrial fibrillation  CT HEAD WITHOUT CONTRAST  Technique:  Contiguous axial images were obtained from the base of the skull through the vertex without contrast.  Comparison: None.  Findings: Normal ventricular morphology. No midline shift or mass effect. Normal appearance of brain parenchyma. No intracranial hemorrhage, mass lesion, or acute infarction. Visualized paranasal sinuses and mastoid air cells clear. Bones unremarkable.  IMPRESSION: Normal exam.  Original Report Authenticated By: Lollie Marrow, M.D.   US Carotid Duplex Bilateral  06/15/2011  *RADIOLOGY REPORT*  Clinical Data:   TIA.  Atrial fibrillation.  BILATERAL CAROTID DUPLEX ULTRASOUND  Technique: Wallace Cullens scale imaging, color Doppler and duplex ultrasound was performed of bilateral carotid and vertebral arteries in the neck.  Comparison: None  Criteria:  Quantification of carotid stenosis is based on velocity parameters that correlate the residual internal carotid diameter with NASCET-based stenosis levels, using the diameter of the distal internal carotid lumen as the denominator for stenosis measurement.  The following velocity measurements were obtained:                   PEAK SYSTOLIC/END DIASTOLIC RIGHT ICA:                        112/27cm/sec CCA:                        114/80cm/sec SYSTOLIC ICA/CCA RATIO:     0.99 DIASTOLIC ICA/CCA RATIO:    1.47 ECA:                        85cm/sec  LEFT ICA:                        87/28cm/sec CCA:                        116/17cm/sec SYSTOLIC ICA/CCA RATIO:     0.75 DIASTOLIC ICA/CCA RATIO:    1.66 ECA:                        58cm/sec  Findings:  RIGHT CAROTID ARTERY: No focal plaque accumulation or stenosis. Normal wave forms with an irregular rhythm, normal   color Doppler signal.  RIGHT VERTEBRAL ARTERY:  Normal flow direction and waveform.  LEFT CAROTID ARTERY:   No significant plaque accumulation or  stenosis.  Normal wave forms and color Doppler signal.  LEFT VERTEBRAL ARTERY:  Normal flow direction and waveform.  IMPRESSION:  1.  No significant carotid bifurcation or proximal ICA plaque or stenosis. 2.  Nonspecific cardiac arrhythmia.  Original Report Authenticated By: Osa Craver, M.D.   Dg Chest Port 1 View  06/14/2011  *RADIOLOGY REPORT*  Clinical Data: Chest pain for 1 week.  PORTABLE CHEST - 1 VIEW  Comparison: None.  Findings: 1913 hours.  There are low lung volumes with patchy bibasilar air space opacities.  There may be small bilateral pleural effusions.  No pneumothorax is evident.  Heart size and mediastinal contours are normal for the degree of inspiration.  No acute osseous findings are seen.  Telemetry leads overlie the chest.  IMPRESSION: Patchy bibasilar air space opacities may reflect atelectasis, although are concerning for possible pneumonia.  Correlate clinically.  There may be small bilateral pleural effusions.  Original Report Authenticated By: Gerrianne Scale, M.D.    Medications: I have reviewed the patient's current medications.  Assessment: Active Problems:  Atrial fibrillation, new onset  Alcohol abuse  TIA (transient ischemic attack)  Renal insufficiency  Acute systolic congestive heart failure   1. Newly diagnosed atrial fibrillation. Cardizem was discontinued in favor of carvedilol given the results of the 2-D echocardiogram. However, his heart rate is not well controlled. Will restart Cardizem but at once daily dosing. Consider adding digoxin. We'll continue Coumadin for anticoagulation. His TSH and free T4 are within normal limits. His magnesium level is within normal limits.  Newly diagnosed acute systolic congestive heart failure/cardiomyopathy. The 2-D echocardiogram results are noted for an ejection fraction of 20-25%, pulmonary hypertension, etc. Dr. Ival Bible assessment and medication changes are noted. Carvedilol and lisinopril were added.  Lasix decreased to once daily. He is diuresing well. His weight is down approximately 5 pounds since admission. His proBNP decreased from greater than 6000 to a little over 2000.  Renal insufficiency. It is likely that he has underlying chronic kidney disease. His creatinine is increasing a little. The dose of Lasix was decreased from twice a day to once daily. His renal function will continue to be monitored.  History of right arm weakness and numbness, now completely resolved. Possibly TIA. The CT of his head revealed no acute findings. Carotid ultrasound reveals no significant ICA stenosis.   Alcohol abuse. The patient says that he has gone several days without alcohol use. He has no history of alcohol withdrawal syndrome or DTs. He is on vitamin therapy and when necessary Ativan.  Plan:  1. Recommendations per cardiology. 2. Will restart Cardizem at 120 mg daily to help with rate control. Will also add low-dose digoxin. 3. Continue supportive treatment. Follow renal function closely. 4. Given his history of alcohol abuse and added Coumadin therapy, we'll add low-dose H2 blockade with Pepcid.    LOS: 2 days   Azizah Lisle 06/16/2011, 8:38 AM

## 2011-06-17 LAB — BASIC METABOLIC PANEL
BUN: 30 mg/dL — ABNORMAL HIGH (ref 6–23)
CO2: 26 mEq/L (ref 19–32)
Calcium: 9 mg/dL (ref 8.4–10.5)
Chloride: 103 mEq/L (ref 96–112)
Creatinine, Ser: 1.64 mg/dL — ABNORMAL HIGH (ref 0.50–1.35)
GFR calc Af Amer: 55 mL/min — ABNORMAL LOW (ref >90)
GFR calc non Af Amer: 48 mL/min — ABNORMAL LOW (ref >90)
Glucose, Bld: 101 mg/dL — ABNORMAL HIGH (ref 70–99)
Potassium: 4.2 mEq/L (ref 3.5–5.1)
Sodium: 138 mEq/L (ref 135–145)

## 2011-06-17 LAB — PROTIME-INR
INR: 1.3 (ref 0.00–1.49)
Prothrombin Time: 16.4 seconds — ABNORMAL HIGH (ref 11.6–15.2)

## 2011-06-17 MED ORDER — LORAZEPAM 0.5 MG PO TABS
0.5000 mg | ORAL_TABLET | Freq: Every evening | ORAL | Status: DC | PRN
  Administered 2011-06-18: 0.5 mg via ORAL
  Filled 2011-06-17: qty 1

## 2011-06-17 MED ORDER — WARFARIN SODIUM 7.5 MG PO TABS
7.5000 mg | ORAL_TABLET | Freq: Once | ORAL | Status: AC
  Administered 2011-06-17: 7.5 mg via ORAL
  Filled 2011-06-17: qty 1

## 2011-06-17 NOTE — Progress Notes (Signed)
Subjective:  The patient continues to have difficulty sleeping because of interruptions in the ICU setting. He has no complaints of chest pain or shortness of breath at rest.  Objective: Vital signs in last 24 hours: Filed Vitals:   06/17/11 0400 06/17/11 0500 06/17/11 0600 06/17/11 0746  BP:  132/92 131/74 122/93  Pulse: 66 48 87 96  Temp: 98.2 F (36.8 C)     TempSrc: Oral     Resp: 32 33 30   Height:      Weight: 88.2 kg (194 lb 7.1 oz)     SpO2: 93% 98% 96%     Intake/Output Summary (Last 24 hours) at 06/17/11 0908 Last data filed at 06/16/11 2300  Gross per 24 hour  Intake    844 ml  Output   1250 ml  Net   -406 ml    Weight change: -0.4 kg (-14.1 oz)  Physical exam: General: Pleasant 51 year old African American man sitting up in bed, in no acute distress. Lungs: fewer crackles auscultated in the lower lung fields bilaterally. Heart: Irregular, irregular, with tachycardia. Abdomen: Positive bowel sounds, soft, nontender, nondistended. Extremity: Trace bilateral lower extremity edema. Neurologic: He is alert and oriented x3. No tremulousness. Cranial nerves II through XII are intact. Strength is 5 over 5 throughout. Sensation is grossly intact. No pronator drift.  Lab Results: Basic Metabolic Panel:  Basename 06/17/11 0500 06/16/11 0215 06/15/11 0440  NA 138 137 --  K 4.2 4.3 --  CL 103 102 --  CO2 26 26 --  GLUCOSE 101* 102* --  BUN 30* 29* --  CREATININE 1.64* 1.78* --  CALCIUM 9.0 8.9 --  MG -- -- 1.6  PHOS -- -- --   Liver Function Tests:  Basename 06/14/11 1924  AST 27  ALT 30  ALKPHOS 76  BILITOT 0.8  PROT 7.3  ALBUMIN 3.7   No results found for this basename: LIPASE:2,AMYLASE:2 in the last 72 hours No results found for this basename: AMMONIA:2 in the last 72 hours CBC:  Basename 06/16/11 0215 06/15/11 0826 06/14/11 1924  WBC 8.3 10.2 --  NEUTROABS -- -- 4.3  HGB 13.9 14.3 --  HCT 41.0 42.0 --  MCV 92.1 91.1 --  PLT 248 260 --    Cardiac Enzymes:  Basename 06/16/11 0215 06/15/11 1957 06/15/11 1440  CKTOTAL 162 166 167  CKMB 3.8 4.3* 4.5*  CKMBINDEX -- -- --  TROPONINI <0.30 0.32* <0.30   BNP:  Basename 06/16/11 0215 06/14/11 1945  PROBNP 2117.0* 6596.0*   D-Dimer: No results found for this basename: DDIMER:2 in the last 72 hours CBG: No results found for this basename: GLUCAP:6 in the last 72 hours Hemoglobin A1C: No results found for this basename: HGBA1C in the last 72 hours Fasting Lipid Panel:  Basename 06/15/11 0827  CHOL 129  HDL 45  LDLCALC 71  TRIG 67  CHOLHDL 2.9  LDLDIRECT --   Thyroid Function Tests:  Basename 06/16/11 0215 06/15/11 0703  TSH 2.579 --  T4TOTAL -- --  FREET4 -- 1.54  T3FREE -- --  THYROIDAB -- --   Anemia Panel: No results found for this basename: VITAMINB12,FOLATE,FERRITIN,TIBC,IRON,RETICCTPCT in the last 72 hours Coagulation:  Basename 06/17/11 0500 06/16/11 0215  LABPROT 16.4* 15.2  INR 1.30 1.18   Urine Drug Screen: Drugs of Abuse     Component Value Date/Time   LABOPIA NONE DETECTED 06/15/2011 0212   COCAINSCRNUR NONE DETECTED 06/15/2011 0212   LABBENZ NONE DETECTED 06/15/2011 0212   AMPHETMU NONE  DETECTED 06/15/2011 0212   THCU NONE DETECTED 06/15/2011 0212   LABBARB NONE DETECTED 06/15/2011 0212    Alcohol Level: No results found for this basename: ETH:2 in the last 72 hours Urinalysis: No results found for this basename: COLORURINE:2,APPERANCEUR:2,LABSPEC:2,PHURINE:2,GLUCOSEU:2,HGBUR:2,BILIRUBINUR:2,KETONESUR:2,PROTEINUR:2,UROBILINOGEN:2,NITRITE:2,LEUKOCYTESUR:2 in the last 72 hours Misc. Labs:   Micro: Recent Results (from the past 240 hour(s))  MRSA PCR SCREENING     Status: Normal   Collection Time   06/14/11 11:56 PM      Component Value Range Status Comment   MRSA by PCR NEGATIVE  NEGATIVE  Final     Studies/Results: US Carotid Duplex Bilateral  06/15/2011  *RADIOLOGY REPORT*  Clinical Data:   TIA.  Atrial fibrillation.  BILATERAL CAROTID  DUPLEX ULTRASOUND  Technique: Wallace Cullens scale imaging, color Doppler and duplex ultrasound was performed of bilateral carotid and vertebral arteries in the neck.  Comparison: None  Criteria:  Quantification of carotid stenosis is based on velocity parameters that correlate the residual internal carotid diameter with NASCET-based stenosis levels, using the diameter of the distal internal carotid lumen as the denominator for stenosis measurement.  The following velocity measurements were obtained:                   PEAK SYSTOLIC/END DIASTOLIC RIGHT ICA:                        112/27cm/sec CCA:                        114/80cm/sec SYSTOLIC ICA/CCA RATIO:     0.99 DIASTOLIC ICA/CCA RATIO:    1.47 ECA:                        85cm/sec  LEFT ICA:                        87/28cm/sec CCA:                        116/17cm/sec SYSTOLIC ICA/CCA RATIO:     0.75 DIASTOLIC ICA/CCA RATIO:    1.66 ECA:                        58cm/sec  Findings:  RIGHT CAROTID ARTERY: No focal plaque accumulation or stenosis. Normal wave forms with an irregular rhythm, normal   color Doppler signal.  RIGHT VERTEBRAL ARTERY:  Normal flow direction and waveform.  LEFT CAROTID ARTERY:   No significant plaque accumulation or stenosis.  Normal wave forms and color Doppler signal.  LEFT VERTEBRAL ARTERY:  Normal flow direction and waveform.  IMPRESSION:  1.  No significant carotid bifurcation or proximal ICA plaque or stenosis. 2.  Nonspecific cardiac arrhythmia.  Original Report Authenticated By: Osa Craver, M.D.    Medications: I have reviewed the patient's current medications.  Assessment: Active Problems:  Atrial fibrillation, new onset  Alcohol abuse  TIA (transient ischemic attack)  Renal insufficiency  Acute systolic congestive heart failure   1. Newly diagnosed atrial fibrillation. Cardizem was discontinued in favor of carvedilol given the results of the 2-D echocardiogram. However, his heart rate is not well controlled. Once daily  dosing of Cardizem and digoxin added. We'll continue Coumadin and Lovenox for anticoagulation. His TSH and free T4 are within normal limits. His magnesium level is within normal limits. He did have one isolated elevated troponin  I.  Newly diagnosed acute systolic congestive heart failure/cardiomyopathy. The 2-D echocardiogram results are noted for an ejection fraction of 20-25%, pulmonary hypertension, etc. Dr. Ival Bible assessment and medication changes are noted. Carvedilol and lisinopril were added. Lasix decreased to once daily. He is diuresing well. His weight is down approximately 5 pounds since admission. His proBNP decreased from greater than 6000 to a little over 2000.  Renal insufficiency. It is likely that he has underlying chronic kidney disease. The dose of Lasix was decreased from twice a day to once daily. His renal function will continue to be monitored.  History of right arm weakness and numbness, now completely resolved. Possibly TIA. The CT of his head revealed no acute findings. Carotid ultrasound reveals no significant ICA stenosis.   Alcohol abuse. The patient says that he has gone several days without alcohol use. He has no history of alcohol withdrawal syndrome or DTs. He is on vitamin therapy and when necessary Ativan.  Plan:  1. Transferred to telemetry. 2. Further recommendations by cardiology pending. 3. When necessary Ativan for sleep.   LOS: 3 days   Chaise Mahabir 06/17/2011, 9:08 AM

## 2011-06-17 NOTE — Progress Notes (Signed)
ANTICOAGULATION CONSULT NOTE   Pharmacy Consult for  Enoxaparin and Warfarin Indication: atrial fibrillation  Allergies  Allergen Reactions  . Azithromycin Itching    Reaction in ED to administration of azithromycin. Patient stated he started itching.   Patient Measurements: Height: 5\' 11"  (180.3 cm) Weight: 194 lb 7.1 oz (88.2 kg) IBW/kg (Calculated) : 75.3    Vital Signs: Temp: 98.2 F (36.8 C) (03/10 0400) Temp src: Oral (03/10 0400) BP: 113/86 mmHg (03/10 1006) Pulse Rate: 89  (03/10 1006)  Labs:  Basename 06/17/11 0500 06/16/11 0215 06/15/11 1957 06/15/11 1440 06/15/11 0826 06/15/11 0149 06/14/11 1924  HGB -- 13.9 -- -- 14.3 -- --  HCT -- 41.0 -- -- 42.0 -- 44.8  PLT -- 248 -- -- 260 -- 280  APTT -- -- -- -- -- -- --  LABPROT 16.4* 15.2 -- -- -- 17.0* --  INR 1.30 1.18 -- -- -- 1.36 --  HEPARINUNFRC -- -- -- -- -- -- --  CREATININE 1.64* 1.78* -- -- -- -- 1.57*  CKTOTAL -- 162 166 167 -- -- --  CKMB -- 3.8 4.3* 4.5* -- -- --  TROPONINI -- <0.30 0.32* <0.30 -- -- --   Estimated Creatinine Clearance: 58 ml/min (by C-G formula based on Cr of 1.64).  Medical History: Past Medical History  Diagnosis Date  . Acute systolic congestive heart failure 06/16/2011    Newly diagnosed. Ejection fraction 20-25%.  . Atrial fibrillation with RVR March 2013    Newly diagnosed  . Pulmonary hypertension March 2013    39 mmHg   Medications:  Scheduled:     . carvedilol  6.25 mg Oral BID WC  . digoxin  0.125 mg Oral Daily  . diltiazem  120 mg Oral Daily  . enoxaparin (LOVENOX) injection  1 mg/kg Subcutaneous Q12H  . famotidine  20 mg Oral Daily  . furosemide  40 mg Intravenous Daily  . lisinopril  2.5 mg Oral Daily  . magnesium oxide  400 mg Oral Daily  . mulitivitamin with minerals  1 tablet Oral Daily  . pneumococcal 23 valent vaccine  0.5 mL Intramuscular Tomorrow-1000  . potassium chloride  40 mEq Oral BID  . thiamine  100 mg Oral Daily  . warfarin  7.5 mg Oral  ONCE-1800  . warfarin  7.5 mg Oral ONCE-1800  . Warfarin - Pharmacist Dosing Inpatient   Does not apply q1800   Assessment: Bridge anticoagulation therapy with full dose Lovenox and Warfarin x 5 days and until INR > 2  Goal of Therapy:   INR 2-3   Plan:  Lovenox 1 mg per kg every 12 hours. Daily INR . Warfarin 7.5 mg today x 1   Jolyssa Oplinger A 06/17/2011,10:36 AM

## 2011-06-17 NOTE — Progress Notes (Signed)
Pt walked to room 337 escorted by L. Zenda Alpers, NT.  Pt is A&O, VSS.  Pt's belongings taken to room 337 with pt.

## 2011-06-17 NOTE — Progress Notes (Signed)
Pt is A&O, VSS.  Pt will transfer to room 337 on Dept 300.  Report given to Lowella Bandy, RN at 11:50am.  Pt will finish lunch and notify staff when he finishes his lunch.

## 2011-06-18 DIAGNOSIS — I5021 Acute systolic (congestive) heart failure: Secondary | ICD-10-CM

## 2011-06-18 LAB — BASIC METABOLIC PANEL
BUN: 28 mg/dL — ABNORMAL HIGH (ref 6–23)
CO2: 25 mEq/L (ref 19–32)
Calcium: 9.1 mg/dL (ref 8.4–10.5)
Chloride: 103 mEq/L (ref 96–112)
Creatinine, Ser: 1.5 mg/dL — ABNORMAL HIGH (ref 0.50–1.35)
GFR calc Af Amer: 61 mL/min — ABNORMAL LOW (ref >90)
GFR calc non Af Amer: 53 mL/min — ABNORMAL LOW (ref >90)
Glucose, Bld: 88 mg/dL (ref 70–99)
Potassium: 4.5 mEq/L (ref 3.5–5.1)
Sodium: 137 mEq/L (ref 135–145)

## 2011-06-18 LAB — PROTIME-INR
INR: 1.55 — ABNORMAL HIGH (ref 0.00–1.49)
Prothrombin Time: 18.9 seconds — ABNORMAL HIGH (ref 11.6–15.2)

## 2011-06-18 LAB — CBC
HCT: 40.7 % (ref 39.0–52.0)
Hemoglobin: 13.6 g/dL (ref 13.0–17.0)
MCH: 30.8 pg (ref 26.0–34.0)
MCHC: 33.4 g/dL (ref 30.0–36.0)
MCV: 92.3 fL (ref 78.0–100.0)
Platelets: 236 10*3/uL (ref 150–400)
RBC: 4.41 MIL/uL (ref 4.22–5.81)
RDW: 12.2 % (ref 11.5–15.5)
WBC: 8.4 10*3/uL (ref 4.0–10.5)

## 2011-06-18 LAB — PRO B NATRIURETIC PEPTIDE: Pro B Natriuretic peptide (BNP): 3078 pg/mL — ABNORMAL HIGH (ref 0–125)

## 2011-06-18 MED ORDER — CARVEDILOL 3.125 MG PO TABS
9.3750 mg | ORAL_TABLET | Freq: Two times a day (BID) | ORAL | Status: DC
  Filled 2011-06-18: qty 3

## 2011-06-18 MED ORDER — WARFARIN SODIUM 7.5 MG PO TABS
7.5000 mg | ORAL_TABLET | Freq: Once | ORAL | Status: AC
  Administered 2011-06-18: 7.5 mg via ORAL
  Filled 2011-06-18: qty 1

## 2011-06-18 MED ORDER — REGADENOSON 0.4 MG/5ML IV SOLN
0.4000 mg | Freq: Once | INTRAVENOUS | Status: DC
  Filled 2011-06-18: qty 5

## 2011-06-18 MED ORDER — POTASSIUM CHLORIDE CRYS ER 20 MEQ PO TBCR: 40.0000 meq | EXTENDED_RELEASE_TABLET | Freq: Once | ORAL | Status: DC

## 2011-06-18 NOTE — Plan of Care (Signed)
Problem: Phase I Progression Outcomes Goal: Up in chair, BRP Outcome: Completed/Met Date Met:  06/18/11 Pt out of bed to bathroom by himself.

## 2011-06-18 NOTE — Progress Notes (Signed)
ANTICOAGULATION CONSULT NOTE   Pharmacy Consult for  Enoxaparin and Warfarin Indication: atrial fibrillation  Allergies  Allergen Reactions  . Azithromycin Itching    Reaction in ED to administration of azithromycin. Patient stated he started itching.   Patient Measurements: Height: 5\' 11"  (180.3 cm) Weight: 193 lb 9 oz (87.8 kg) IBW/kg (Calculated) : 75.3    Vital Signs: Temp: 97.8 F (36.6 C) (03/11 1048) Temp src: Oral (03/11 1048) BP: 121/89 mmHg (03/11 1048) Pulse Rate: 85  (03/11 1048)  Labs:  Basename 06/18/11 0452 06/17/11 0500 06/16/11 0215 06/15/11 1957 06/15/11 1440  HGB 13.6 -- 13.9 -- --  HCT 40.7 -- 41.0 -- --  PLT 236 -- 248 -- --  APTT -- -- -- -- --  LABPROT 18.9* 16.4* 15.2 -- --  INR 1.55* 1.30 1.18 -- --  HEPARINUNFRC -- -- -- -- --  CREATININE 1.50* 1.64* 1.78* -- --  CKTOTAL -- -- 162 166 167  CKMB -- -- 3.8 4.3* 4.5*  TROPONINI -- -- <0.30 0.32* <0.30   Estimated Creatinine Clearance: 63.4 ml/min (by C-G formula based on Cr of 1.5).  Medical History: Past Medical History  Diagnosis Date  . Acute systolic congestive heart failure 06/16/2011    Newly diagnosed. Ejection fraction 20-25%.  . Atrial fibrillation with RVR March 2013    Newly diagnosed  . Pulmonary hypertension March 2013    39 mmHg   Medications:  Scheduled:     . carvedilol  9.375 mg Oral BID WC  . digoxin  0.125 mg Oral Daily  . diltiazem  120 mg Oral Daily  . enoxaparin (LOVENOX) injection  1 mg/kg Subcutaneous Q12H  . famotidine  20 mg Oral Daily  . furosemide  40 mg Intravenous Daily  . lisinopril  2.5 mg Oral Daily  . magnesium oxide  400 mg Oral Daily  . mulitivitamin with minerals  1 tablet Oral Daily  . potassium chloride  40 mEq Oral Once  . regadenoson  0.4 mg Intravenous Once  . thiamine  100 mg Oral Daily  . warfarin  7.5 mg Oral ONCE-1800  . Warfarin - Pharmacist Dosing Inpatient   Does not apply q1800  . DISCONTD: carvedilol  6.25 mg Oral BID WC  .  DISCONTD: potassium chloride  40 mEq Oral BID   Assessment: Okay for Protocol  Goal of Therapy:   INR 2-3   Plan:  Lovenox 1 mg per kg every 12 hours. Daily INR . Repeat Warfarin 7.5 mg today x 1   Mady Gemma 06/18/2011,1:10 PM

## 2011-06-18 NOTE — Plan of Care (Signed)
Problem: Phase II Progression Outcomes Goal: Begin discharge teaching Outcome: Completed/Met Date Met:  06/18/11 Discussed CHF packet and showed video for CHF.

## 2011-06-18 NOTE — Progress Notes (Addendum)
SUBJECTIVE:Breathing better and has no complaints of chest discomfort or orthopnea.  Active Problems:  Atrial fibrillation, new onset  Alcohol abuse  TIA (transient ischemic attack)  Renal insufficiency  Acute systolic congestive heart failure   LABS: Basic Metabolic Panel:  Basename 06/18/11 0452 06/17/11 0500  NA 137 138  K 4.5 4.2  CL 103 103  CO2 25 26  GLUCOSE 88 101*  BUN 28* 30*  CREATININE 1.50* 1.64*  CALCIUM 9.1 9.0  MG -- --  PHOS -- --   CBC:  Basename 06/18/11 0452 06/16/11 0215  WBC 8.4 8.3  NEUTROABS -- --  HGB 13.6 13.9  HCT 40.7 41.0  MCV 92.3 92.1  PLT 236 248   Cardiac Enzymes:  Basename 06/16/11 0215 06/15/11 1957 06/15/11 1440  CKTOTAL 162 166 167  CKMB 3.8 4.3* 4.5*  CKMBINDEX -- -- --  TROPONINI <0.30 0.32* <0.30   Thyroid Function Tests:  Basename 06/16/11 0215  TSH 2.579  T4TOTAL --  T3FREE --  THYROIDAB --   ECHO 06/15/2011 Left ventricle: The cavity size was at the upper limits of normal. There are prominent ventricular muscle bands noted, no definite muiral thrombus. Wall thickness was increased in a pattern of mild LVH. Systolic function was severely reduced. The estimated ejection fraction was in the range of 20% to 25%. Diffuse hypokinesis. The study is not technically sufficient to allow evaluation of LV diastolic function - atrial fibrillation present. - Aortic valve: Trileaflet; mildly calcified leaflets. Moderate regurgitation. - Aortic root: The aortic root was mildly dilated. - Mitral valve: Mild regurgitation. - Left atrium: The atrium was mildly to moderately dilated. - Right ventricle: Systolic function was severely reduced. - Tricuspid valve: Mild regurgitation. - Pulmonary arteries: PA peak pressure: 39mm Hg (S). - Inferior vena cava: The vessel was dilated; the respirophasic diameter changes were blunted (< 50%); findings are consistent with elevated central venous pressure. - Pericardium, extracardiac: A  trivial pericardial effusion was identified. There was a left pleural effusion.   RADIOLOGY: IMPRESSION:06/14/2011 Patchy bibasilar air space opacities may reflect atelectasis, although are concerning for possible pneumonia. Correlate clinically. There may be small bilateral pleural effusions.  PHYSICAL EXAM BP 126/77  Pulse 98  Temp(Src) 98 F (36.7 C) (Oral)  Resp 18  Ht 5\' 11"  (1.803 m)  Wt 193 lb 9 oz (87.8 kg)  BMI 27.00 kg/m2  SpO2 94%WT Loss 7 lbs General: Well developed, well nourished, in no acute distress Head: Eyes PERRLA, No xanthomas.   Normal cephalic and atramatic  Lungs: Clear bilaterally to auscultation and percussion. Heart: HRIRNo MRG .  Pulses are 2+ & equal.            No carotid bruit. No JVD.  No abdominal bruits. No femoral bruits. Abdomen: Bowel sounds are positive, abdomen soft and non-tender without masses or                  Hernia's noted. Msk:  Back normal, normal gait. Normal strength and tone for age. Extremities: No clubbing, cyanosis or edema.  DP +1 Neuro: Alert and oriented X 3. Psych:  Good affect, responds appropriately  TELEMETRY: Reviewed telemetry pt in Atrial fib rates 90-100bpm.  ASSESSMENT AND PLAN:  1. Severe Systolic Dysfunction: EF of  20% to 25% with diffuse hypokinesis. He is tolerating coreg without hemodynamic compromise. CHF is resolving with 7 lb wt loss. Lungs are clear. Will plan stress test in am for evaluation of ischemia in this setting. If abnormal will plan to  transfer to [facility] for cardiac cath. Continue current medications.  2. Atrial fib: Heart rate is essentially controlled, rates as high as 90. Will increase doses of coreg if HR remains in 90's to 100 bpm. BP is stable 130-120's systolic and could tolerate higher dose of coreg.  Will increase to 9.375 mg BID. (1 1/2 tab of 6.25 mg)and watch HR response. Would not go up on CCB with EF so low. Continue coumadin.   Bettey Mare. Lyman Bishop NP [facility] 06/18/2011, 9:45 AM   Attending note:  Patient seen and examined. He is now transferred out to telemetry, has been doing some ambulation. Complains of no chest pain.  On examination he is afebrile, heart rates in the 80s to 90s in atrial fibrillation, blood pressure 121/89. Lungs are clear, cardiac exam with irregularly irregular rhythm, no S3, no peripheral edema.  Lab work reviewed showing potassium 4.5, BUN 28, creatinine 1.5, BNP 3078, hemoglobin 13.8, platelets 236, INR of 1.5.  Over weekend, he was placed back on Cardizem and also digoxin, for better heart rate control. He has otherwise tolerated Coreg.  General plan will be for an inpatient Myoview to assess ischemia tomorrow. Will also continue to advance Coreg, keep on digoxin, and ultimately plan to discontinue Cardizem CD.  ,Jonelle Sidle, M.D., F.A.C.C.

## 2011-06-18 NOTE — Progress Notes (Signed)
CARE MANAGEMENT NOTE 06/18/2011  Patient:  David [name], David [name]   Account Number:  [id number]  Date Initiated:  06/18/2011  Documentation initiated by:  Rosemary Holms  Subjective/Objective Assessment:   Pt admitted with A. Fib.Marland Kitchen PTA lived with family.     Action/Plan:   Spoke to pt at bedside. Pt is concerned about finances for bill and paying for his meds at discharge. CM will work with pt nearer DC   Anticipated DC Date:  06/19/2011   Anticipated DC Plan:  HOME/SELF CARE  In-house referral  Financial Counselor      DC Planning Services  CM consult      Choice offered to / List presented to:             Status of service:  In process, will continue to follow Medicare Important Message given?   (If response is "NO", the following Medicare IM given date fields will be blank) Date Medicare IM given:   Date Additional Medicare IM given:    Discharge Disposition:    Per UR Regulation:    Comments:  06/18/11 1500 Donnavin Vandenbrink Leanord Hawking RN BSN

## 2011-06-18 NOTE — Progress Notes (Signed)
Subjective:  He feels much better. No complaints of chest pain or chest congestion.  Objective: Vital signs in last 24 hours: Filed Vitals:   06/17/11 1430 06/17/11 2105 06/18/11 0600 06/18/11 1048  BP: 146/88 120/81 126/77 121/89  Pulse: 98 76 98 85  Temp: 97.3 F (36.3 C) 97.7 F (36.5 C) 98 F (36.7 C) 97.8 F (36.6 C)  TempSrc: Oral Oral Oral Oral  Resp: 22 18 18 18   Height:      Weight:   87.8 kg (193 lb 9 oz)   SpO2: 95% 93% 94% 99%    Intake/Output Summary (Last 24 hours) at 06/18/11 1641 Last data filed at 06/18/11 1200  Gross per 24 hour  Intake   1220 ml  Output      0 ml  Net   1220 ml    Weight change: -0.4 kg (-14.1 oz)  Physical exam: General: Pleasant 51 year old African American man sitting up in bed, in no acute distress. Lungs: Clear to auscultation bilaterally. Heart: Irregular, irregular. Abdomen: Positive bowel sounds, soft, nontender, nondistended. Extremity: Nearly resolved lower extremity edema. Neurologic: He is alert and oriented x3. No tremulousness. Cranial nerves II through XII are intact. Strength is 5 over 5 throughout. Sensation is grossly intact. No pronator drift.  Lab Results: Basic Metabolic Panel:  Basename 06/18/11 0452 06/17/11 0500  NA 137 138  K 4.5 4.2  CL 103 103  CO2 25 26  GLUCOSE 88 101*  BUN 28* 30*  CREATININE 1.50* 1.64*  CALCIUM 9.1 9.0  MG -- --  PHOS -- --   Liver Function Tests: No results found for this basename: AST:2,ALT:2,ALKPHOS:2,BILITOT:2,PROT:2,ALBUMIN:2 in the last 72 hours No results found for this basename: LIPASE:2,AMYLASE:2 in the last 72 hours No results found for this basename: AMMONIA:2 in the last 72 hours CBC:  Basename 06/18/11 0452 06/16/11 0215  WBC 8.4 8.3  NEUTROABS -- --  HGB 13.6 13.9  HCT 40.7 41.0  MCV 92.3 92.1  PLT 236 248   Cardiac Enzymes:  Basename 06/16/11 0215 06/15/11 1957  CKTOTAL 162 166  CKMB 3.8 4.3*  CKMBINDEX -- --  TROPONINI <0.30 0.32*    BNP:  Basename 06/18/11 0452 06/16/11 0215  PROBNP 3078.0* 2117.0*   D-Dimer: No results found for this basename: DDIMER:2 in the last 72 hours CBG: No results found for this basename: GLUCAP:6 in the last 72 hours Hemoglobin A1C: No results found for this basename: HGBA1C in the last 72 hours Fasting Lipid Panel: No results found for this basename: CHOL,HDL,LDLCALC,TRIG,CHOLHDL,LDLDIRECT in the last 72 hours Thyroid Function Tests:  Basename 06/16/11 0215  TSH 2.579  T4TOTAL --  FREET4 --  T3FREE --  THYROIDAB --   Anemia Panel: No results found for this basename: VITAMINB12,FOLATE,FERRITIN,TIBC,IRON,RETICCTPCT in the last 72 hours Coagulation:  Basename 06/18/11 0452 06/17/11 0500  LABPROT 18.9* 16.4*  INR 1.55* 1.30   Urine Drug Screen: Drugs of Abuse     Component Value Date/Time   LABOPIA NONE DETECTED 06/15/2011 0212   COCAINSCRNUR NONE DETECTED 06/15/2011 0212   LABBENZ NONE DETECTED 06/15/2011 0212   AMPHETMU NONE DETECTED 06/15/2011 0212   THCU NONE DETECTED 06/15/2011 0212   LABBARB NONE DETECTED 06/15/2011 0212    Alcohol Level: No results found for this basename: ETH:2 in the last 72 hours Urinalysis: No results found for this basename: COLORURINE:2,APPERANCEUR:2,LABSPEC:2,PHURINE:2,GLUCOSEU:2,HGBUR:2,BILIRUBINUR:2,KETONESUR:2,PROTEINUR:2,UROBILINOGEN:2,NITRITE:2,LEUKOCYTESUR:2 in the last 72 hours Misc. Labs:   Micro: Recent Results (from the past 240 hour(s))  MRSA PCR SCREENING     Status:  Normal   Collection Time   06/14/11 11:56 PM      Component Value Range Status Comment   MRSA by PCR NEGATIVE  NEGATIVE  Final     Studies/Results: No results found.  Medications: I have reviewed the patient's current medications.  Assessment: Active Problems:  Atrial fibrillation, new onset  Alcohol abuse  TIA (transient ischemic attack)  Renal insufficiency  Acute systolic congestive heart failure   1. Newly diagnosed atrial fibrillation. Cardizem was  discontinued in favor of carvedilol given the results of the 2-D echocardiogram. However, his heart rate is not well controlled. Once daily dosing of Cardizem and digoxin added. We'll continue Coumadin and Lovenox for anticoagulation. His TSH and free T4 are within normal limits. His magnesium level is within normal limits. He did have one isolated elevated troponin I.  Newly diagnosed acute systolic congestive heart failure/cardiomyopathy. The 2-D echocardiogram results are noted for an ejection fraction of 20-25%, pulmonary hypertension, etc. Dr. Ival Bible assessment and medication changes are noted. Carvedilol and lisinopril were added. Lasix decreased to once daily. He is diuresing well. His weight is down approximately 7 pounds since admission. His proBNP has decreased  Renal insufficiency. It is likely that he has underlying chronic kidney disease. The dose of Lasix was decreased from twice a day to once daily. His renal function will continue to be monitored.  History of right arm weakness and numbness, now completely resolved. Possibly TIA. The CT of his head revealed no acute findings. Carotid ultrasound reveals no significant ICA stenosis.   Alcohol abuse. The patient says that he has gone several days without alcohol use. He has no history of alcohol withdrawal syndrome or DTs. He is on vitamin therapy and when necessary Ativan.  Plan:  1. Cardiology's assessment and recommendations are noted and appreciated. Plan stress test tomorrow. Otherwise, continue current management.   LOS: 4 days   David [name] 06/18/2011, 4:41 PM

## 2011-06-18 NOTE — Progress Notes (Signed)
Spoke with David [name] with [facility] and I asked her since the pt would not have his stress test until tomorrow, could he eat his previous diet and be NPO after midnight.  [name] stated that this was okay.

## 2011-06-19 ENCOUNTER — Inpatient Hospital Stay (HOSPITAL_COMMUNITY): Payer: Self-pay

## 2011-06-19 ENCOUNTER — Encounter (HOSPITAL_COMMUNITY): Payer: Self-pay

## 2011-06-19 DIAGNOSIS — I4891 Unspecified atrial fibrillation: Secondary | ICD-10-CM

## 2011-06-19 LAB — PROTIME-INR
INR: 1.67 — ABNORMAL HIGH (ref 0.00–1.49)
Prothrombin Time: 20 seconds — ABNORMAL HIGH (ref 11.6–15.2)

## 2011-06-19 MED ORDER — LISINOPRIL 2.5 MG PO TABS: 2.5000 mg | ORAL_TABLET | Freq: Every day | ORAL | Status: DC

## 2011-06-19 MED ORDER — REGADENOSON 0.4 MG/5ML IV SOLN
INTRAVENOUS | Status: AC
  Administered 2011-06-19: 0.4 mg via INTRAVENOUS
  Filled 2011-06-19: qty 5

## 2011-06-19 MED ORDER — CARVEDILOL 12.5 MG PO TABS
12.5000 mg | ORAL_TABLET | Freq: Two times a day (BID) | ORAL | Status: DC
  Administered 2011-06-19: 12.5 mg via ORAL
  Filled 2011-06-19: qty 1

## 2011-06-19 MED ORDER — WARFARIN SODIUM 5 MG PO TABS: ORAL_TABLET | ORAL | Status: DC

## 2011-06-19 MED ORDER — DILTIAZEM HCL ER COATED BEADS 120 MG PO CP24: 120.0000 mg | ORAL_CAPSULE | Freq: Every day | ORAL | Status: DC

## 2011-06-19 MED ORDER — TECHNETIUM TC 99M TETROFOSMIN IV KIT
30.0000 | PACK | Freq: Once | INTRAVENOUS | Status: AC | PRN
  Administered 2011-06-19: 30 via INTRAVENOUS

## 2011-06-19 MED ORDER — FAMOTIDINE 20 MG PO TABS: 20.0000 mg | ORAL_TABLET | Freq: Every day | ORAL | Status: DC

## 2011-06-19 MED ORDER — FUROSEMIDE 40 MG PO TABS: 40.0000 mg | ORAL_TABLET | Freq: Every day | ORAL | Status: DC

## 2011-06-19 MED ORDER — TECHNETIUM TC 99M TETROFOSMIN IV KIT
10.0000 | PACK | Freq: Once | INTRAVENOUS | Status: AC | PRN
  Administered 2011-06-19: 9.1 via INTRAVENOUS

## 2011-06-19 MED ORDER — DIGOXIN 125 MCG PO TABS: 0.1250 mg | ORAL_TABLET | Freq: Every day | ORAL | Status: DC

## 2011-06-19 MED ORDER — CARVEDILOL 12.5 MG PO TABS: 12.5000 mg | ORAL_TABLET | Freq: Two times a day (BID) | ORAL | Status: DC

## 2011-06-19 MED ORDER — WARFARIN SODIUM 7.5 MG PO TABS
7.5000 mg | ORAL_TABLET | Freq: Once | ORAL | Status: AC
  Administered 2011-06-19: 7.5 mg via ORAL
  Filled 2011-06-19: qty 1

## 2011-06-19 MED ORDER — INFLUENZA VIRUS VACC SPLIT PF IM SUSP
0.5000 mL | Freq: Once | INTRAMUSCULAR | Status: AC
  Administered 2011-06-19: 0.5 mL via INTRAMUSCULAR
  Filled 2011-06-19: qty 0.5

## 2011-06-19 MED ORDER — POTASSIUM CHLORIDE CRYS ER 10 MEQ PO TBCR: 10.0000 meq | EXTENDED_RELEASE_TABLET | Freq: Every day | ORAL | Status: DC

## 2011-06-19 MED ORDER — SODIUM CHLORIDE 0.9 % IJ SOLN
INTRAMUSCULAR | Status: AC
  Administered 2011-06-19: 10 mL via INTRAVENOUS
  Filled 2011-06-19: qty 10

## 2011-06-19 MED ORDER — SODIUM CHLORIDE 0.9 % IJ SOLN
INTRAMUSCULAR | Status: AC
  Administered 2011-06-19: 10 mL
  Filled 2011-06-19: qty 3

## 2011-06-19 NOTE — Discharge Summary (Signed)
Physician Discharge Summary  Zedekiah Hinderman MRN: [id number] DOB/AGE: November 18, 1960 51 y.o.  PCP: No primary provider on file.   Admit date: 06/14/2011 Discharge date: 06/19/2011  Discharge Diagnoses:  1. 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 The patient's 2-D echocardiogram revealed an ejection of 20-25%. Myoview stress test revealed an ejection fraction of 22% with diffuse hypokinesis and findings suggestive of nonischemic cardiomyopathy. 2. The patient's weight on admission was 90.7 kg and at the time of discharge, it was 87.6 kg. 3. Newly diagnosed atrial for ablation with rapid ventricular response. 4. Possible transient ischemic attack. 5. Alcohol abuse. 6. 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       Medication List  As of 06/19/2011  7:34 PM   TAKE these medications         carvedilol 12.5 MG tablet   Commonly known as: COREG   Take 1 tablet (12.5 mg total) by mouth 2 (two) times daily with a meal.      digoxin 0.125 MG tablet   Commonly known as: LANOXIN   Take 1 tablet (0.125 mg total) by mouth daily.      diltiazem 120 MG 24 hr capsule   Commonly known as: CARDIZEM CD   Take 1 capsule (120 mg total) by mouth daily.      famotidine 20 MG tablet   Commonly known as: PEPCID   Take 1 tablet (20 mg total) by mouth daily.      furosemide 40 MG tablet   Commonly known as: LASIX   Take 1 tablet (40 mg total) by mouth daily.      lisinopril 2.5 MG tablet   Commonly known as: PRINIVIL,ZESTRIL   Take 1 tablet (2.5 mg total) by mouth daily.      potassium chloride 10 MEQ tablet   Commonly known as: K-DUR,KLOR-CON   Take 1 tablet (10 mEq total) by mouth daily.      warfarin 5 MG tablet   Commonly known as: COUMADIN   TAKE  1 AND 1/2 TABLETS EACH EVENING OR AS DIRECTED.            Discharge Condition: Improved and stable   Disposition: 01-Home or Self  Care   Consults: Nona Dell, M.D.    Significant Diagnostic Studies: Ct Head Wo Contrast  06/15/2011  *RADIOLOGY REPORT*  Clinical Data: TIA, lost feeling in right side for approximately 5 minutes 3 days ago, history atrial fibrillation  CT HEAD WITHOUT CONTRAST  Technique:  Contiguous axial images were obtained from the base of the skull through the vertex without contrast.  Comparison: None.  Findings: Normal ventricular morphology. No midline shift or mass effect. Normal appearance of brain parenchyma. No intracranial hemorrhage, mass lesion, or acute infarction. Visualized paranasal sinuses and mastoid air cells clear. Bones unremarkable.  IMPRESSION: Normal exam.  Original Report Authenticated By: Lollie Marrow, M.D.   US Carotid Duplex Bilateral  06/15/2011  *RADIOLOGY REPORT*  Clinical Data:   TIA.  Atrial fibrillation.  BILATERAL CAROTID DUPLEX ULTRASOUND  Technique: Wallace Cullens scale imaging, color Doppler and duplex ultrasound was performed of bilateral carotid and vertebral arteries in the neck.  Comparison: None  Criteria:  Quantification of carotid stenosis is based on velocity parameters that correlate the residual internal carotid diameter with NASCET-based stenosis levels, using the diameter of the distal internal carotid lumen as the denominator for stenosis measurement.  The following velocity measurements were obtained:  PEAK SYSTOLIC/END DIASTOLIC RIGHT ICA:                        112/27cm/sec CCA:                        114/80cm/sec SYSTOLIC ICA/CCA RATIO:     0.99 DIASTOLIC ICA/CCA RATIO:    1.47 ECA:                        85cm/sec  LEFT ICA:                        87/28cm/sec CCA:                        116/17cm/sec SYSTOLIC ICA/CCA RATIO:     0.75 DIASTOLIC ICA/CCA RATIO:    1.66 ECA:                        58cm/sec  Findings:  RIGHT CAROTID ARTERY: No focal plaque accumulation or stenosis. Normal wave forms with an irregular rhythm, normal   color Doppler signal.   RIGHT VERTEBRAL ARTERY:  Normal flow direction and waveform.  LEFT CAROTID ARTERY:   No significant plaque accumulation or stenosis.  Normal wave forms and color Doppler signal.  LEFT VERTEBRAL ARTERY:  Normal flow direction and waveform.  IMPRESSION:  1.  No significant carotid bifurcation or proximal ICA plaque or stenosis. 2.  Nonspecific cardiac arrhythmia.  Original Report Authenticated By: Osa Craver, M.D.   Nm Myocar Single W/spect W/wall Motion And Ef  06/19/2011  Ordering Physician: Joni Reining  Reading Physician: Jonelle Sidle  Clinical Data: 51 year old male with newly diagnosed atrial fibrillation, acute systolic heart failure, and alcohol abuse, referred for the assessment of underlying ischemic heart disease.  NUCLEAR MEDICINE STRESS MYOVIEW STUDY WITH SPECT AND LEFT VENTRICULAR EJECTION FRACTION  Radionuclide Data: One-day rest/stress protocol performed with 10/30 mCi of Tc-71m Myoview.  Stress Data: Lexiscan bolus was given in standard fashion.  Heart rate increased to 137 beats per minute.  Blood pressure increased from 120/88 up to 138/90.  The patient denied any significant chest pain was otherwise hemodynamically stable.  Electrocardiogram showed no diagnostic ST-segment changes.  He remained in atrial fibrillation throughout.  EKG: Baseline ECG shows atrial fibrillation with left ventricle hypertrophy and diffuse nonspecific ST-T changes.  Scintigraphic Data: Analysis of the raw perfusion data shows evidence of diaphragmatic attenuation.  Tomographic views were obtained using the short axis, vertical long axis, and horizontal long axis planes.  There is a mild intensity, fixed inferior defect most consistent with soft tissue attenuation although cannot completely exclude scar.  There is no clear evidence of ischemia.  Gated imaging reveals an EDV of 257, ESV of 199, T I D ratio of 1.03, and LVEF of 22% with diffuse hypokinesis.  IMPRESSION: Abnormal Lexiscan Myoview as  outlined.  The patient remained in atrial fibrillation throughout, had no diagnostic ST-segment changes, and reported no chest pain.  He was hemodynamically stable.  Perfusion imaging shows diaphragmatic attenuation with probable soft tissue attenuation versus scar affecting the inferior wall, however no clear ischemia.  LVEF is reduced to 22% with diffuse hypokinesis and dilated ventricle. Overall pattern could be consistent with nonischemic cardiomyopathy.  Original Report Authenticated By: Suanne Marker Chest Port 1 View  06/14/2011  *RADIOLOGY REPORT*  Clinical Data: Chest pain for 1 week.  PORTABLE CHEST - 1 VIEW  Comparison: None.  Findings: 1913 hours.  There are low lung volumes with patchy bibasilar air space opacities.  There may be small bilateral pleural effusions.  No pneumothorax is evident.  Heart size and mediastinal contours are normal for the degree of inspiration.  No acute osseous findings are seen.  Telemetry leads overlie the chest.  IMPRESSION: Patchy bibasilar air space opacities may reflect atelectasis, although are concerning for possible pneumonia.  Correlate clinically.  There may be small bilateral pleural effusions.  Original Report Authenticated By: Gerrianne Scale, M.D.   ECHO: Study Conclusions  - Left ventricle: The cavity size was at the upper limits of normal. There are prominent ventricular muscle bands noted, no definite muiral thrombus. Wall thickness was increased in a pattern of mild LVH. Systolic function was severely reduced. The estimated ejection fraction was in the range of 20% to 25%. Diffuse hypokinesis. The study is not technically sufficient to allow evaluation of LV diastolic function - atrial fibrillation present. - Aortic valve: Trileaflet; mildly calcified leaflets. Moderate regurgitation. - Aortic root: The aortic root was mildly dilated. - Mitral valve: Mild regurgitation. - Left atrium: The atrium was mildly to moderately dilated. - Right  ventricle: Systolic function was severely reduced. - Tricuspid valve: Mild regurgitation. - Pulmonary arteries: PA peak pressure: 39mm Hg (S). - Inferior vena cava: The vessel was dilated; the respirophasic diameter changes were blunted (< 50%); findings are consistent with elevated central venous pressure. - Pericardium, extracardiac: A trivial pericardial effusion was identified. There was a left pleural effusion. Transthoracic echocardiography. M-mode, complete 2D, spectral Doppler, and color Doppler. Height: Height: 180.3cm. Height: 71in. Weight: Weight: 88.5kg. Weight: 194.6lb. Body mass index: BMI: 27.2kg/m^2. Body surface area: BSA: 2.49m^2. Patient status: Inpatient. Location: ICU/CCU     Microbiology: Recent Results (from the past 240 hour(s))  MRSA PCR SCREENING     Status: Normal   Collection Time   06/14/11 11:56 PM      Component Value Range Status Comment   MRSA by PCR NEGATIVE  NEGATIVE  Final      Labs: Results for orders placed during the hospital encounter of 06/14/11 (from the past 48 hour(s))  BASIC METABOLIC PANEL     Status: Abnormal   Collection Time   06/18/11  4:52 AM      Component Value Range Comment   Sodium 137  135 - 145 (mEq/L)    Potassium 4.5  3.5 - 5.1 (mEq/L)    Chloride 103  96 - 112 (mEq/L)    CO2 25  19 - 32 (mEq/L)    Glucose, Bld 88  70 - 99 (mg/dL)    BUN 28 (*) 6 - 23 (mg/dL)    Creatinine, Ser 4.13 (*) 0.50 - 1.35 (mg/dL)    Calcium 9.1  8.4 - 10.5 (mg/dL)    GFR calc non Af Amer 53 (*) >90 (mL/min)    GFR calc Af Amer 61 (*) >90 (mL/min)   PROTIME-INR     Status: Abnormal   Collection Time   06/18/11  4:52 AM      Component Value Range Comment   Prothrombin Time 18.9 (*) 11.6 - 15.2 (seconds)    INR 1.55 (*) 0.00 - 1.49    CBC     Status: Normal   Collection Time   06/18/11  4:52 AM      Component Value Range Comment   WBC 8.4  4.0 -  10.5 (K/uL)    RBC 4.41  4.22 - 5.81 (MIL/uL)    Hemoglobin 13.6  13.0 - 17.0 (g/dL)     HCT 16.1  09.6 - 04.5 (%)    MCV 92.3  78.0 - 100.0 (fL)    MCH 30.8  26.0 - 34.0 (pg)    MCHC 33.4  30.0 - 36.0 (g/dL)    RDW 40.9  81.1 - 91.4 (%)    Platelets 236  150 - 400 (K/uL)   PRO B NATRIURETIC PEPTIDE     Status: Abnormal   Collection Time   06/18/11  4:52 AM      Component Value Range Comment   Pro B Natriuretic peptide (BNP) 3078.0 (*) 0 - 125 (pg/mL)   PROTIME-INR     Status: Abnormal   Collection Time   06/19/11  5:33 AM      Component Value Range Comment   Prothrombin Time 20.0 (*) 11.6 - 15.2 (seconds)    INR 1.67 (*) 0.00 - 1.49       HPI : The patient is a 51 year old man with no previous chronic medical conditions, who presented to the emergency department on June 14 2011 with a chief complaint of progressive shortness of breath. He also had a complaint of complete numbness of his right upper extremity and drooling from his mouth when he tried to drink water. This lasted for approximately 5 minutes but then it resolved. During the initial assessment in the emergency department, he was noted to be afebrile and hemodynamically stable. His lab data were remarkable for a BUN of 23, creatinine of 1.57, normal troponin I of less than 0.30, and a pro BNP of 6596. His chest x-ray revealed patchy bibasilar airspace opacities and possibly bilateral small effusions. His EKG revealed atrial fibrillation. He was admitted for further evaluation and management.   HOSPITAL COURSE: The patient was started on treatment with IV Lasix, full dose Lovenox, and a Cardizem drip. He was subsequently started on Coumadin. Vitamin therapy was started. When necessary Ativan was initiated for a possibility of alcohol withdrawal syndrome. The patient was counseled on sobriety. For further evaluation, a number of studies were ordered. His cardiac enzymes were within normal limits. His urine drug screen was negative. CT scan of his head revealed no acute intracranial abnormalities. His carotid ultrasound  revealed no significant ICA stenosis. His TSH and free T4 were within normal limits. His followup EKGs revealed atrial fibrillation. The results of his 2-D echocardiogram were dictated above. His fasting lipid profile revealed a total cholesterol of 129, trig glycerides of 67, HDL cholesterol 45, and LDL cholesterol of 71. After several days of IV Lasix, his pro BNP decreased to 2117.  Cardiologist, Dr. Diona Browner was consulted. He agreed with medical management, however, he made some changes in the medication regimen. Eventually, carvedilol and Prinivil were added. Cardizem was discontinued.  Over the weekend prior to his discharge, the patient's heart rate was not controlled. Therefore, Cardizem had to be restarted. Digoxin was also added. With these changes, his heart rate became consistently below 100 beats per minute.  For further evaluation, a Myoview stress test was ordered by Dr. Diona Browner. The results were dictated above. Per his assessment, the patient probably had nonischemic cardiomyopathy. Eventually, the patient may be appropriate for DCCV. This will be discussed with him further by the cardiology team.  The patient improved symptomatically and clinically. His peripheral edema virtually completely resolved. He diuresed very well. He lost approximately 7 pounds of fluid  weight. His INR was monitored daily. Coumadin dosing was managed by the pharmacy staff. His INR was 1.67 prior to discharge. His renal function stabilized with a creatinine of 1.50. It appears that the patient has underlying chronic kidney disease. His pro BNP did increase him to a little over 3000 prior to discharge. He remained hemodynamically stable and afebrile. He was advised to stop drinking several times during the hospitalization.    Discharge Exam: Blood pressure 159/97, pulse 81, temperature 97.4 F (36.3 C), temperature source Oral, resp. rate 20, height 5\' 11"  (1.803 m), weight 87.6 kg (193 lb 2 oz), SpO2  99.00%.  Lungs: Clear to auscultation bilaterally. Heart: Irregular irregular Abdomen: Positive bowel sounds, soft, nontender, nondistended. Extremities: Resolution of pedal edema.    Discharge Orders    Future Appointments: Provider: Department: Dept Phone: Center:   06/21/2011 10:00 AM [facility] [phone] [facility]   06/27/2011 11:20 AM Jodelle Gross, NP [facility] [phone] [id number]     Future Orders Please Complete By Expires   Diet - low sodium heart healthy      Increase activity slowly      Discharge instructions      Comments:   AVOID ALCOHOL AND TOBACCO AND STREET DRUGS.      Follow-up Information    Follow up with [place] Bing, MD. (Coumadin clinic on Thursday, 3/14 at 10am)    Contact information:   [address] [place] [address] [phone]       Follow up with [place] Bing, MD. (Your followup appointment after hospital stay is on 3/20 at 11:20am.)    Contact information:   [address] [place] [address] [phone]       Follow up with [facility].   Contact information:   [phone]         Total discharge time: 40 minutes.   Signed: Osmara Drummonds 06/19/2011, 7:34 PM

## 2011-06-19 NOTE — Progress Notes (Deleted)
CARE MANAGEMENT NOTE 06/19/2011  Patient:  David [name]   Account Number:  [id number]  Date Initiated:  06/19/2011  Documentation initiated by:  Rosemary Holms  Subjective/Objective Assessment:   Pt admitted from home with spouse. Presented with stroke symptoms.     Action/Plan:   Pt will be dc'd back home with spouse and [facility] RN and PT   Anticipated DC Date:  06/19/2011   Anticipated DC Plan:  HOME W HOME HEALTH SERVICES      DC Planning Services  CM consult      Choice offered to / List presented to:          [facility] arranged  HH-1 RN  HH-10 DISEASE MANAGEMENT  HH-2 PT      HH agency  [facility]   Status of service:  Completed, signed off Medicare Important Message given?   (If response is "NO", the following Medicare IM given date fields will be blank) Date Medicare IM given:   Date Additional Medicare IM given:    Discharge Disposition:  HOME W HOME HEALTH SERVICES  Per UR Regulation:    If discussed at Long Length of Stay Meetings, dates discussed:    Comments:  06/19/11 1400 Yasmyn Bellisario RN BSN CM

## 2011-06-19 NOTE — Progress Notes (Signed)
Stress Lab Nurses Notes - David [name]  David [name] 06/19/2011  Reason for doing test: AFib Type of test: Steffanie Dunn Nurse performing test: Parke Poisson, RN Nuclear Medicine Tech: Lyndel Pleasure Echo Tech: Not Applicable MD performing test: Ival Bible Family MD: NPCP Test explained and consent signed: yes IV started: 20g jelco, Saline lock flushed, No redness or edema and Saline lock from floor Symptoms: feeling "funny" Treatment/Intervention: None Reason test stopped: protocol completed After recovery IV was: Saline Lock flushed Patient to return to Nuc. Med at : 14:00 Patient discharged: Transported back to room 337 via wc Patient's Condition upon discharge was: stable Comments: During test BP 130/88 & HR 119.  Recovery BP 138/90 & HR 99.  Symptoms resolved in recovery. Erskine Speed T

## 2011-06-19 NOTE — Progress Notes (Signed)
CARE MANAGEMENT NOTE 06/19/2011  Patient:  David [name], David [name]   Account Number:  [id number]  Date Initiated:  06/18/2011  Documentation initiated by:  Rosemary Holms  Subjective/Objective Assessment:   Pt admitted with A. Fib.Marland Kitchen PTA lived with family.     Action/Plan:   Spoke to pt at bedside. Pt is concerned about finances for bill and paying for his meds at discharge. CM will work with pt nearer DC   Anticipated DC Date:  06/19/2011   Anticipated DC Plan:  HOME/SELF CARE  In-house referral  Financial Counselor      DC Planning Services  CM consult      Choice offered to / List presented to:             Status of service:  Completed, signed off Medicare Important Message given?   (If response is "NO", the following Medicare IM given date fields will be blank) Date Medicare IM given:   Date Additional Medicare IM given:    Discharge Disposition:  HOME/SELF CARE  Per UR Regulation:    If discussed at Long Length of Stay Meetings, dates discussed:    Comments:  06/19/11 1545 Severina Sykora Leanord Hawking RN BSN CM Medication assistance through the Indigent Medication Fund with [employer] given to pt. Reminded that he has refills on the RXs that he can refill at [facility] or have [facility] pharmacist call for Rx transfer.  06/18/11 1500 Roey Coopman Leanord Hawking RN BSN

## 2011-06-19 NOTE — Progress Notes (Signed)
   Subjective: No chest pain, ambulating in halls. No palpitations.   Objective: Temp:  [97.6 F (36.4 C)-98.2 F (36.8 C)] 97.6 F (36.4 C) (03/12 0900) Pulse Rate:  [85-94] 94  (03/12 0900) Resp:  [18-20] 20  (03/12 0900) BP: (117-137)/(78-89) 135/78 mmHg (03/12 0900) SpO2:  [96 %-99 %] 98 % (03/12 0900) Weight:  [193 lb 2 oz (87.6 kg)] 193 lb 2 oz (87.6 kg) (03/12 0546)  I/O last 3 completed shifts: In: 1420 [P.O.:1420] Out: -   Telemetry - Atrial fibrillation.  Exam -   General - Normally nourished, no acute distress.  Lungs - Clear, nonlabored.  Cardiac - Irregularly irregular, no S3.  Extremities - No pitting.  Testing -   Lab Results  Component Value Date   WBC 8.4 06/18/2011   HGB 13.6 06/18/2011   HCT 40.7 06/18/2011   MCV 92.3 06/18/2011   PLT 236 06/18/2011    Lab Results  Component Value Date   CREATININE 1.50* 06/18/2011   BUN 28* 06/18/2011   NA 137 06/18/2011   K 4.5 06/18/2011   CL 103 06/18/2011   CO2 25 06/18/2011    Lab Results  Component Value Date   CKTOTAL 162 06/16/2011   CKMB 3.8 06/16/2011   TROPONINI <0.30 06/16/2011   INR 1.6  Current Medications    . carvedilol  9.375 mg Oral BID WC  . digoxin  0.125 mg Oral Daily  . diltiazem  120 mg Oral Daily  . enoxaparin (LOVENOX) injection  1 mg/kg Subcutaneous Q12H  . famotidine  20 mg Oral Daily  . furosemide  40 mg Intravenous Daily  . lisinopril  2.5 mg Oral Daily  . magnesium oxide  400 mg Oral Daily  . mulitivitamin with minerals  1 tablet Oral Daily  . potassium chloride  40 mEq Oral Once  . regadenoson  0.4 mg Intravenous Once  . thiamine  100 mg Oral Daily  . warfarin  7.5 mg Oral ONCE-1800  . warfarin  7.5 mg Oral ONCE-1800  . Warfarin - Pharmacist Dosing Inpatient   Does not apply q1800     Assessment:  1. Atrial fibrillation, persistent, rate control getting better. CHADS2 score is 2, duration uncertain.  2. Newly diagnosed cardiomyopathy, possibly nonischemic, LVEF  20-25%. Possibility of tachycardia mediated, or alcohol-induced, to be considered. Ischemic workup pending.  3. Alcohol abuse. Discussed cessation. Patient seems reasonably motivated.  4. Chronic kidney disease, stage I.  Plan:  Increase Coreg to 12.5 mg twice daily, otherwise continue current regimen. Plan will be to eventually discontinue Cardizem CD. He is scheduled for a Lexiscan Myoview today for ischemic evaluation. If this is reassuring, anticipate further outpatient management on medical therapy, possible DCCV electively. If there is significant evidence of ischemia, cardiac catheterization can be discussed.  Jonelle Sidle, M.D., F.A.C.C.

## 2011-06-19 NOTE — Progress Notes (Signed)
See formal Myoview report. Most consistent with nonischemic cardiomyopathy. Discussed with patient this afternoon as well as Dr. Sherrie Mustache. His heart rate is elevated in atrial fibrillation, did not receive his morning medications. I asked nursing to go ahead and give his Cardizem CD, and evening dose of Coreg, which was increased earlier today. If his heart rate comes down, I expect he could be discharged home later this evening. We will schedule follow up in our Coumadin clinic, and also a regular visit to discuss ongoing management, possible elective DCCV.

## 2011-06-19 NOTE — Progress Notes (Signed)
Subjective:  He feels much better. No complaints of chest pain or chest congestion.  Objective: Vital signs in last 24 hours: Filed Vitals:   06/18/11 1842 06/18/11 2150 06/19/11 0546 06/19/11 0900  BP: 137/89 128/80 117/81 135/78  Pulse: 87 86 93 94  Temp: 97.6 F (36.4 C) 98 F (36.7 C) 98.2 F (36.8 C) 97.6 F (36.4 C)  TempSrc: Oral Oral Oral   Resp: 18 18 18 20   Height:      Weight:   87.6 kg (193 lb 2 oz)   SpO2: 98% 96% 97% 98%    Intake/Output Summary (Last 24 hours) at 06/19/11 1245 Last data filed at 06/19/11 1233  Gross per 24 hour  Intake    440 ml  Output      0 ml  Net    440 ml    Weight change: -0.2 kg (-7.1 oz)  Physical exam: General: Pleasant 51 year old African American man sitting up in bed, in no acute distress. Lungs: Clear to auscultation bilaterally. Heart: Irregular, irregular. Abdomen: Positive bowel sounds, soft, nontender, nondistended. Extremity: Nearly resolved lower extremity edema. Neurologic: He is alert and oriented x3. No tremulousness. Cranial nerves II through XII are intact. Strength is 5 over 5 throughout. Sensation is grossly intact. No pronator drift.  Lab Results: Basic Metabolic Panel:  Basename 06/18/11 0452 06/17/11 0500  NA 137 138  K 4.5 4.2  CL 103 103  CO2 25 26  GLUCOSE 88 101*  BUN 28* 30*  CREATININE 1.50* 1.64*  CALCIUM 9.1 9.0  MG -- --  PHOS -- --   Liver Function Tests: No results found for this basename: AST:2,ALT:2,ALKPHOS:2,BILITOT:2,PROT:2,ALBUMIN:2 in the last 72 hours No results found for this basename: LIPASE:2,AMYLASE:2 in the last 72 hours No results found for this basename: AMMONIA:2 in the last 72 hours CBC:  Basename 06/18/11 0452  WBC 8.4  NEUTROABS --  HGB 13.6  HCT 40.7  MCV 92.3  PLT 236   Cardiac Enzymes: No results found for this basename: CKTOTAL:3,CKMB:3,CKMBINDEX:3,TROPONINI:3 in the last 72 hours BNP:  Basename 06/18/11 0452  PROBNP 3078.0*   D-Dimer: No results  found for this basename: DDIMER:2 in the last 72 hours CBG: No results found for this basename: GLUCAP:6 in the last 72 hours Hemoglobin A1C: No results found for this basename: HGBA1C in the last 72 hours Fasting Lipid Panel: No results found for this basename: CHOL,HDL,LDLCALC,TRIG,CHOLHDL,LDLDIRECT in the last 72 hours Thyroid Function Tests: No results found for this basename: TSH,T4TOTAL,FREET4,T3FREE,THYROIDAB in the last 72 hours Anemia Panel: No results found for this basename: VITAMINB12,FOLATE,FERRITIN,TIBC,IRON,RETICCTPCT in the last 72 hours Coagulation:  Basename 06/19/11 0533 06/18/11 0452  LABPROT 20.0* 18.9*  INR 1.67* 1.55*   Urine Drug Screen: Drugs of Abuse     Component Value Date/Time   LABOPIA NONE DETECTED 06/15/2011 0212   COCAINSCRNUR NONE DETECTED 06/15/2011 0212   LABBENZ NONE DETECTED 06/15/2011 0212   AMPHETMU NONE DETECTED 06/15/2011 0212   THCU NONE DETECTED 06/15/2011 0212   LABBARB NONE DETECTED 06/15/2011 0212    Alcohol Level: No results found for this basename: ETH:2 in the last 72 hours Urinalysis: No results found for this basename: COLORURINE:2,APPERANCEUR:2,LABSPEC:2,PHURINE:2,GLUCOSEU:2,HGBUR:2,BILIRUBINUR:2,KETONESUR:2,PROTEINUR:2,UROBILINOGEN:2,NITRITE:2,LEUKOCYTESUR:2 in the last 72 hours Misc. Labs:   Micro: Recent Results (from the past 240 hour(s))  MRSA PCR SCREENING     Status: Normal   Collection Time   06/14/11 11:56 PM      Component Value Range Status Comment   MRSA by PCR NEGATIVE  NEGATIVE  Final  Studies/Results: No results found.  Medications: I have reviewed the patient's current medications.  Assessment: Active Problems:  Atrial fibrillation, new onset  Alcohol abuse  TIA (transient ischemic attack)  Renal insufficiency  Acute systolic congestive heart failure   1. Newly diagnosed atrial fibrillation. Cardizem was discontinued in favor of carvedilol given the results of the 2-D echocardiogram. However, his heart  rate is not well controlled. Once daily dosing of Cardizem and digoxin added. We'll continue Coumadin and Lovenox for anticoagulation. His TSH and free T4 are within normal limits. His magnesium level is within normal limits. He did have one isolated elevated troponin I.  Newly diagnosed acute systolic congestive heart failure/cardiomyopathy. The 2-D echocardiogram results are noted for an ejection fraction of 20-25%, pulmonary hypertension, etc. Dr. Ival Bible assessment and medication changes are noted. Carvedilol and lisinopril were added. Lasix decreased to once daily. He is diuresing well. His weight is down approximately 7 pounds since admission. His proBNP has decreased. Myoview stress test this today.  Renal insufficiency. It is likely that he has underlying chronic kidney disease. The dose of Lasix was decreased from twice a day to once daily. His renal function will continue to be monitored.  History of right arm weakness and numbness, now completely resolved. Possibly TIA. The CT of his head revealed no acute findings. Carotid ultrasound reveals no significant ICA stenosis.   Alcohol abuse. The patient says that he has gone several days without alcohol use. He has no history of alcohol withdrawal syndrome or DTs. He is on vitamin therapy and when necessary Ativan.  Plan: 1. Myoview stress test today, then further recommendations per Dr. Diona Browner. 2. Hopefully discharge soon.    LOS: 49 days   David [name] 06/19/2011, 12:45 PM

## 2011-06-19 NOTE — Progress Notes (Addendum)
ANTICOAGULATION CONSULT NOTE   Pharmacy Consult for  Enoxaparin and Warfarin Indication: atrial fibrillation  Allergies  Allergen Reactions  . Azithromycin Itching    Reaction in ED to administration of azithromycin. Patient stated he started itching.   Patient Measurements: Height: 5\' 11"  (180.3 cm) Weight: 193 lb 2 oz (87.6 kg) IBW/kg (Calculated) : 75.3    Vital Signs: Temp: 97.6 F (36.4 C) (03/12 0900) Temp src: Oral (03/12 0546) BP: 135/78 mmHg (03/12 0900) Pulse Rate: 94  (03/12 0900)  Labs:  Basename 06/19/11 0533 06/18/11 0452 06/17/11 0500  HGB -- 13.6 --  HCT -- 40.7 --  PLT -- 236 --  APTT -- -- --  LABPROT 20.0* 18.9* 16.4*  INR 1.67* 1.55* 1.30  HEPARINUNFRC -- -- --  CREATININE -- 1.50* 1.64*  CKTOTAL -- -- --  CKMB -- -- --  TROPONINI -- -- --   Estimated Creatinine Clearance: 63.4 ml/min (by C-G formula based on Cr of 1.5).  Medical History: Past Medical History  Diagnosis Date  . Acute systolic congestive heart failure 06/16/2011    Newly diagnosed. Ejection fraction 20-25%.  . Atrial fibrillation with RVR March 2013    Newly diagnosed  . Pulmonary hypertension March 2013    39 mmHg   Medications:  Scheduled:     . carvedilol  9.375 mg Oral BID WC  . digoxin  0.125 mg Oral Daily  . diltiazem  120 mg Oral Daily  . enoxaparin (LOVENOX) injection  1 mg/kg Subcutaneous Q12H  . famotidine  20 mg Oral Daily  . furosemide  40 mg Intravenous Daily  . lisinopril  2.5 mg Oral Daily  . magnesium oxide  400 mg Oral Daily  . mulitivitamin with minerals  1 tablet Oral Daily  . potassium chloride  40 mEq Oral Once  . regadenoson  0.4 mg Intravenous Once  . thiamine  100 mg Oral Daily  . warfarin  7.5 mg Oral ONCE-1800  . Warfarin - Pharmacist Dosing Inpatient   Does not apply q1800  . DISCONTD: carvedilol  6.25 mg Oral BID WC   Assessment: INR subtherapeutic but trending up.  Goal of Therapy:   INR 2-3   Plan:  Lovenox 1 mg per kg every  12 hours. Daily INR . Repeat Warfarin 7.5 mg today x 1. Patient taught regarding Warfarin. Verbalized understanding.   Gilman Buttner, [place] J 06/19/2011,9:49 AM

## 2011-06-20 NOTE — Progress Notes (Signed)
Discharge instructions given, verbalized understanding, out ambulatory in stable condition with staff. 

## 2011-06-21 ENCOUNTER — Ambulatory Visit (INDEPENDENT_AMBULATORY_CARE_PROVIDER_SITE_OTHER): Payer: Self-pay | Admitting: *Deleted

## 2011-06-21 DIAGNOSIS — I4891 Unspecified atrial fibrillation: Secondary | ICD-10-CM

## 2011-06-21 DIAGNOSIS — Z7901 Long term (current) use of anticoagulants: Secondary | ICD-10-CM

## 2011-06-21 DIAGNOSIS — G459 Transient cerebral ischemic attack, unspecified: Secondary | ICD-10-CM

## 2011-06-21 LAB — POCT INR: INR: 1.8

## 2011-06-25 ENCOUNTER — Ambulatory Visit (INDEPENDENT_AMBULATORY_CARE_PROVIDER_SITE_OTHER): Payer: Self-pay | Admitting: *Deleted

## 2011-06-25 ENCOUNTER — Encounter: Payer: Self-pay | Admitting: Adult Health

## 2011-06-25 DIAGNOSIS — I4891 Unspecified atrial fibrillation: Secondary | ICD-10-CM

## 2011-06-25 DIAGNOSIS — G459 Transient cerebral ischemic attack, unspecified: Secondary | ICD-10-CM

## 2011-06-25 DIAGNOSIS — Z7901 Long term (current) use of anticoagulants: Secondary | ICD-10-CM

## 2011-06-25 LAB — POCT INR: INR: 2.4

## 2011-06-27 ENCOUNTER — Encounter: Payer: Self-pay | Admitting: Adult Health

## 2011-06-27 ENCOUNTER — Ambulatory Visit (INDEPENDENT_AMBULATORY_CARE_PROVIDER_SITE_OTHER): Payer: Self-pay | Admitting: Adult Health

## 2011-06-27 VITALS — BP 129/92 | HR 65 | Ht 71.0 in | Wt 184.0 lb

## 2011-06-27 DIAGNOSIS — I10 Essential (primary) hypertension: Secondary | ICD-10-CM

## 2011-06-27 DIAGNOSIS — I5021 Acute systolic (congestive) heart failure: Secondary | ICD-10-CM

## 2011-06-27 DIAGNOSIS — I509 Heart failure, unspecified: Secondary | ICD-10-CM

## 2011-06-27 DIAGNOSIS — I4891 Unspecified atrial fibrillation: Secondary | ICD-10-CM

## 2011-06-27 NOTE — Assessment & Plan Note (Addendum)
He has lost an addition 9 lbs and is medically complaint.  I will continue him on his current medications. He will have BMET done to evaluate kidney function. He is advised on low sodium diet. We discussed the possibility of ICD pacemaker should he not recover his heart function. This will be discussed further if necessary. He will have an Echo completed in 3 months for reassessment of LV fx. will see him monthly to continue to evaluate him.

## 2011-06-27 NOTE — Progress Notes (Signed)
   HPI: [name] is a  51 y/o patient of Dr. Diona Browner who we are seeing on hospital follow-up after admission for acute systolic CHF, nonischemic CM, EF of 22%, new onset atrial flutter, with CKD and ETOH abuse. He was diuresed during hospitalization and placed on warfarin, digoxin, diltiazem, and  carvediolol. Since discharge he has lost 9 lbs more, has been medically complaint, and stopped drinking. He says that he played basket ball with his children over the weekend and did not have any problems with chest pain or dyspnea.   Allergies  Allergen Reactions  . Azithromycin Itching    Reaction in ED to administration of azithromycin. Patient stated he started itching.    Current Outpatient Prescriptions  Medication Sig Dispense Refill  . carvedilol (COREG) 12.5 MG tablet Take 1 tablet (12.5 mg total) by mouth 2 (two) times daily with a meal.  60 tablet  2  . digoxin (LANOXIN) 0.125 MG tablet Take 1 tablet (0.125 mg total) by mouth daily.  30 tablet  2  . diltiazem (CARDIZEM CD) 120 MG 24 hr capsule Take 1 capsule (120 mg total) by mouth daily.  30 capsule  2  . famotidine (PEPCID) 20 MG tablet Take 1 tablet (20 mg total) by mouth daily.      . furosemide (LASIX) 40 MG tablet Take 1 tablet (40 mg total) by mouth daily.  30 tablet  2  . lisinopril (PRINIVIL,ZESTRIL) 2.5 MG tablet Take 1 tablet (2.5 mg total) by mouth daily.  30 tablet  2  . potassium chloride SA (K-DUR,KLOR-CON) 10 MEQ tablet Take 1 tablet (10 mEq total) by mouth daily.  30 tablet  2  . warfarin (COUMADIN) 5 MG tablet TAKE  1 AND 1/2 TABLETS EACH EVENING OR AS DIRECTED.  45 tablet  2    Past Medical History  Diagnosis Date  . Acute systolic congestive heart failure 06/16/2011    Newly diagnosed. Ejection fraction 20-25%.  . Atrial fibrillation with RVR March 2013    Newly diagnosed  . Pulmonary hypertension March 2013    39 mmHg  . Renal insufficiency   . Alcohol abuse   . TIA (transient ischemic attack)     Past  Surgical History  Procedure Date  . Knee surgery   . Knee surgery 1987    arthroscopic    RUE:AVWUJW of systems complete and found to be negative unless listed above  PHYSICAL EXAM BP 129/92  Pulse 65  Ht 5\' 11"  (1.803 m)  Wt 184 lb (83.462 kg)  BMI 25.66 kg/m2  General: Well developed, well nourished, in no acute distress Head: Eyes PERRLA, No xanthomas.   Normal cephalic and atramatic  Lungs: Clear bilaterally to auscultation and percussion. Heart: HRIR,  Distant,  without MRG.  Pulses are 2+ & equal.            No carotid bruit. No JVD.  No abdominal bruits. No femoral bruits. Abdomen: Bowel sounds are positive, abdomen soft and non-tender without masses or                  Hernia's noted. Msk:  Back normal, normal gait. Normal strength and tone for age. Extremities: No clubbing, cyanosis or edema.  DP +1 Neuro: Alert and oriented X 3. Psych:  Good affect, responds appropriately EKG: Atrial fibrillation LVH, T-wave inversion inferior.laterally.  ASSESSMENT AND PLAN

## 2011-06-27 NOTE — Patient Instructions (Signed)
Your physician recommends that you schedule a follow-up appointment in: 1 month   Your physician recommends that you return for lab work in: Today Designer, [employer])

## 2011-06-27 NOTE — Progress Notes (Signed)
Addended by: Reather Laurence A on: 06/27/2011 04:57 PM   Modules accepted: Orders

## 2011-06-27 NOTE — Assessment & Plan Note (Signed)
He continues on warfarin. INR completed in our coumadin clinic. He denies any complaints of bleeding or bruising. Heart rate is well controlled. No changes in medications.

## 2011-06-29 LAB — BASIC METABOLIC PANEL
BUN: 27 mg/dL — ABNORMAL HIGH (ref 6–23)
CO2: 28 mEq/L (ref 19–32)
Calcium: 9.2 mg/dL (ref 8.4–10.5)
Chloride: 102 mEq/L (ref 96–112)
Creat: 1.53 mg/dL — ABNORMAL HIGH (ref 0.50–1.35)
Glucose, Bld: 87 mg/dL (ref 70–99)
Potassium: 4.4 mEq/L (ref 3.5–5.3)
Sodium: 138 mEq/L (ref 135–145)

## 2011-07-02 ENCOUNTER — Encounter: Payer: Self-pay | Admitting: *Deleted

## 2011-07-02 ENCOUNTER — Ambulatory Visit (INDEPENDENT_AMBULATORY_CARE_PROVIDER_SITE_OTHER): Payer: Self-pay | Admitting: *Deleted

## 2011-07-02 DIAGNOSIS — I4891 Unspecified atrial fibrillation: Secondary | ICD-10-CM

## 2011-07-02 DIAGNOSIS — G459 Transient cerebral ischemic attack, unspecified: Secondary | ICD-10-CM

## 2011-07-02 DIAGNOSIS — Z7901 Long term (current) use of anticoagulants: Secondary | ICD-10-CM

## 2011-07-02 LAB — POCT INR: INR: 3.2

## 2011-07-16 ENCOUNTER — Other Ambulatory Visit: Payer: Self-pay

## 2011-07-16 ENCOUNTER — Ambulatory Visit (INDEPENDENT_AMBULATORY_CARE_PROVIDER_SITE_OTHER): Payer: Self-pay | Admitting: *Deleted

## 2011-07-16 DIAGNOSIS — G459 Transient cerebral ischemic attack, unspecified: Secondary | ICD-10-CM

## 2011-07-16 DIAGNOSIS — Z7901 Long term (current) use of anticoagulants: Secondary | ICD-10-CM

## 2011-07-16 DIAGNOSIS — I4891 Unspecified atrial fibrillation: Secondary | ICD-10-CM

## 2011-07-16 LAB — POCT INR: INR: 3.1

## 2011-07-16 MED ORDER — CARVEDILOL 12.5 MG PO TABS: 12.5000 mg | ORAL_TABLET | Freq: Two times a day (BID) | ORAL | Status: DC

## 2011-07-16 MED ORDER — DILTIAZEM HCL ER COATED BEADS 120 MG PO CP24: 120.0000 mg | ORAL_CAPSULE | Freq: Every day | ORAL | Status: DC

## 2011-07-16 MED ORDER — LISINOPRIL 2.5 MG PO TABS: 2.5000 mg | ORAL_TABLET | Freq: Every day | ORAL | Status: DC

## 2011-07-16 MED ORDER — POTASSIUM CHLORIDE CRYS ER 10 MEQ PO TBCR: 10.0000 meq | EXTENDED_RELEASE_TABLET | Freq: Every day | ORAL | Status: DC

## 2011-07-16 MED ORDER — FUROSEMIDE 40 MG PO TABS: 40.0000 mg | ORAL_TABLET | Freq: Every day | ORAL | Status: DC

## 2011-07-16 MED ORDER — FAMOTIDINE 20 MG PO TABS: 20.0000 mg | ORAL_TABLET | Freq: Every day | ORAL | Status: DC

## 2011-07-16 MED ORDER — WARFARIN SODIUM 5 MG PO TABS: ORAL_TABLET | ORAL | Status: DC

## 2011-07-16 MED ORDER — DIGOXIN 125 MCG PO TABS: 0.1250 mg | ORAL_TABLET | Freq: Every day | ORAL | Status: DC

## 2011-07-30 ENCOUNTER — Encounter: Payer: Self-pay | Admitting: Adult Health

## 2011-07-30 ENCOUNTER — Ambulatory Visit (INDEPENDENT_AMBULATORY_CARE_PROVIDER_SITE_OTHER): Payer: Self-pay | Admitting: Adult Health

## 2011-07-30 VITALS — BP 118/68 | HR 66 | Resp 18 | Ht 71.0 in | Wt 187.0 lb

## 2011-07-30 DIAGNOSIS — I509 Heart failure, unspecified: Secondary | ICD-10-CM

## 2011-07-30 DIAGNOSIS — I5021 Acute systolic (congestive) heart failure: Secondary | ICD-10-CM

## 2011-07-30 DIAGNOSIS — I10 Essential (primary) hypertension: Secondary | ICD-10-CM

## 2011-07-30 DIAGNOSIS — I4891 Unspecified atrial fibrillation: Secondary | ICD-10-CM

## 2011-07-30 NOTE — Progress Notes (Signed)
   HPI: David [name] is a  51 y/o patient of Dr. Diona Browner who we are seeing on hospital follow-up after admission for acute systolic CHF, nonischemic CM, EF of 22%, new onset atrial flutter, with CKD and ETOH abuse. He was diuresed during hospitalization and placed on warfarin, digoxin, diltiazem, and  Carvediolol. He is medically compliant and asymptomatic. He is avoiding salt and has no complaints of DOE, edema or palpitations.  Allergies  Allergen Reactions  . Azithromycin Itching    Reaction in ED to administration of azithromycin. Patient stated he started itching.    Current Outpatient Prescriptions  Medication Sig Dispense Refill  . carvedilol (COREG) 12.5 MG tablet Take 1 tablet (12.5 mg total) by mouth 2 (two) times daily with a meal.  60 tablet  2  . digoxin (LANOXIN) 0.125 MG tablet Take 1 tablet (0.125 mg total) by mouth daily.  30 tablet  2  . diltiazem (CARDIZEM CD) 120 MG 24 hr capsule Take 1 capsule (120 mg total) by mouth daily.  30 capsule  2  . famotidine (PEPCID) 20 MG tablet Take 1 tablet (20 mg total) by mouth daily.  30 tablet  2  . furosemide (LASIX) 40 MG tablet Take 1 tablet (40 mg total) by mouth daily.  30 tablet  2  . lisinopril (PRINIVIL,ZESTRIL) 2.5 MG tablet Take 1 tablet (2.5 mg total) by mouth daily.  30 tablet  2  . potassium chloride (K-DUR,KLOR-CON) 10 MEQ tablet Take 1 tablet (10 mEq total) by mouth daily.  30 tablet  2  . warfarin (COUMADIN) 5 MG tablet TAKE  1 AND 1/2 TABLETS EACH EVENING OR AS DIRECTED.  45 tablet  2    Past Medical History  Diagnosis Date  . Acute systolic congestive heart failure 06/16/2011    Newly diagnosed. Ejection fraction 20-25%.  . Atrial fibrillation with RVR March 2013    Newly diagnosed  . Pulmonary hypertension March 2013    39 mmHg  . Renal insufficiency   . Alcohol abuse   . TIA (transient ischemic attack)     Past Surgical History  Procedure Date  . Knee surgery   . Knee surgery 1987    arthroscopic     ZOX:WRUEAV of systems complete and found to be negative unless listed above  PHYSICAL EXAM BP 118/68  Pulse 66  Resp 18  Ht 5\' 11"  (1.803 m)  Wt 187 lb (84.823 kg)  BMI 26.08 kg/m2  General: Well developed, well nourished, in no acute distress Head: Eyes PERRLA, No xanthomas.   Normal cephalic and atramatic  Lungs: Clear bilaterally to auscultation and percussion. Heart: HRIR,  Distant,  without MRG.  Pulses are 2+ & equal.            No carotid bruit. No JVD.  No abdominal bruits. No femoral bruits. Abdomen: Bowel sounds are positive, abdomen soft and non-tender without masses or                  Hernia's noted. Msk:  Back normal, normal gait. Normal strength and tone for age. Extremities: No clubbing, cyanosis or edema.  DP +1 Neuro: Alert and oriented X 3. Psych:  Good affect, responds appropriately EKG: Atrial fibrillation LVH, T-wave inversion inferior.laterally.  ASSESSMENT AND PLAN

## 2011-07-30 NOTE — Patient Instructions (Signed)
**Note De-identified David Oplinger Obfuscation** Your physician has requested that you have an echocardiogram. Echocardiography is a painless test that uses sound waves to create images of your heart. It provides your doctor with information about the size and shape of your heart and how well your heart's chambers and valves are working. This procedure takes approximately one hour. There are no restrictions for this procedure.  Your physician recommends that you continue on your current medications as directed. Please refer to the Current Medication list given to you today.  Your physician recommends that you schedule a follow-up appointment in: 1 month  

## 2011-07-30 NOTE — Assessment & Plan Note (Signed)
He continues to be well compensated and asymptomatic. He remains active and is avoiding salt. I will repeat his echo for re-assessment of LV fx. He will continue is current medications without changes.

## 2011-07-30 NOTE — Assessment & Plan Note (Signed)
He is rate controlled and continues his coumadin. He will follow in clinic for dosing as per schedule. He is to call us for any bleeding issues.

## 2011-08-06 ENCOUNTER — Ambulatory Visit (INDEPENDENT_AMBULATORY_CARE_PROVIDER_SITE_OTHER): Payer: Self-pay | Admitting: *Deleted

## 2011-08-06 DIAGNOSIS — I4891 Unspecified atrial fibrillation: Secondary | ICD-10-CM

## 2011-08-06 DIAGNOSIS — G459 Transient cerebral ischemic attack, unspecified: Secondary | ICD-10-CM

## 2011-08-06 DIAGNOSIS — Z7901 Long term (current) use of anticoagulants: Secondary | ICD-10-CM

## 2011-08-06 LAB — POCT INR: INR: 2.4

## 2011-08-27 ENCOUNTER — Ambulatory Visit (HOSPITAL_COMMUNITY)
Admission: RE | Admit: 2011-08-27 | Discharge: 2011-08-27 | Disposition: A | Discharge status: Home / Self Care | Payer: Self-pay | Source: Ambulatory Visit | Attending: Adult Health | Admitting: Adult Health

## 2011-08-27 ENCOUNTER — Ambulatory Visit (INDEPENDENT_AMBULATORY_CARE_PROVIDER_SITE_OTHER): Payer: Self-pay | Admitting: *Deleted

## 2011-08-27 DIAGNOSIS — Z7901 Long term (current) use of anticoagulants: Secondary | ICD-10-CM

## 2011-08-27 DIAGNOSIS — I4891 Unspecified atrial fibrillation: Secondary | ICD-10-CM

## 2011-08-27 DIAGNOSIS — Z8673 Personal history of transient ischemic attack (TIA), and cerebral infarction without residual deficits: Secondary | ICD-10-CM | POA: Insufficient documentation

## 2011-08-27 DIAGNOSIS — I509 Heart failure, unspecified: Secondary | ICD-10-CM | POA: Insufficient documentation

## 2011-08-27 DIAGNOSIS — I359 Nonrheumatic aortic valve disorder, unspecified: Secondary | ICD-10-CM

## 2011-08-27 DIAGNOSIS — G459 Transient cerebral ischemic attack, unspecified: Secondary | ICD-10-CM

## 2011-08-27 LAB — POCT INR: INR: 4.4

## 2011-08-27 NOTE — Progress Notes (Signed)
*  PRELIMINARY RESULTS* Echocardiogram 2D Echocardiogram has been performed.  David [name] 08/27/2011, 10:53 AM

## 2011-08-31 ENCOUNTER — Ambulatory Visit (INDEPENDENT_AMBULATORY_CARE_PROVIDER_SITE_OTHER): Payer: Self-pay | Admitting: Cardiology

## 2011-08-31 ENCOUNTER — Encounter: Payer: Self-pay | Admitting: Cardiology

## 2011-08-31 VITALS — BP 113/71 | HR 70 | Resp 16 | Ht 71.0 in | Wt 190.0 lb

## 2011-08-31 DIAGNOSIS — I4891 Unspecified atrial fibrillation: Secondary | ICD-10-CM

## 2011-08-31 DIAGNOSIS — I429 Cardiomyopathy, unspecified: Secondary | ICD-10-CM

## 2011-08-31 DIAGNOSIS — N182 Chronic kidney disease, stage 2 (mild): Secondary | ICD-10-CM

## 2011-08-31 LAB — BASIC METABOLIC PANEL
BUN: 18 mg/dL (ref 6–23)
CO2: 24 mEq/L (ref 19–32)
Calcium: 9.5 mg/dL (ref 8.4–10.5)
Chloride: 103 mEq/L (ref 96–112)
Creat: 1.58 mg/dL — ABNORMAL HIGH (ref 0.50–1.35)
Glucose, Bld: 139 mg/dL — ABNORMAL HIGH (ref 70–99)
Potassium: 4 mEq/L (ref 3.5–5.3)
Sodium: 138 mEq/L (ref 135–145)

## 2011-08-31 MED ORDER — CARVEDILOL 25 MG PO TABS: 25.0000 mg | ORAL_TABLET | Freq: Two times a day (BID) | ORAL | Status: DC

## 2011-08-31 NOTE — Assessment & Plan Note (Signed)
Nonischemic cardiomyopathy with improvement in LVEF to 50% based on recent echocardiogram. Would like to simplify medications. Discontinue diltiazem with concurrent increase in Coreg to 25 mg twice daily. Continue digoxin, although followup on digoxin level with CKD.

## 2011-08-31 NOTE — Patient Instructions (Signed)
**Note De-Identified Lamonte Hartt Obfuscation** Your physician has recommended you make the following change in your medication: stop taking Cardizem and increase Carvedilol to 25 mg twice daily  Your physician recommends that you return for lab work in: today  Your physician recommends that you schedule a follow-up appointment in: 1 month

## 2011-08-31 NOTE — Assessment & Plan Note (Signed)
Relatively stable at this point with good heart rate control. He reports compliance with medications, tolerating Coumadin. We discussed a plan for elective cardioversion in an attempt to restore sinus rhythm. INR will be checked weekly through the Coumadin clinic, and after 4 weeks of therapeutic levels, we can consider DCCV.

## 2011-08-31 NOTE — Progress Notes (Signed)
Clinical Summary David [name] is a 51 y.o.male presenting for followup. He was seen back in April by Ms. David [name]. He is here with his mother. He states that he has been doing well without palpitations or chest pain, NYHA class 1-2 dyspnea on exertion. No orthopnea or PND. He reports compliance with his medications.  Followup echocardiogram done recently shows improvement in LVEF to 50%, possible functionally bicuspid aortic valve with mild regurgitation, mildly reduced RV function, mild right atrial enlargement. We discussed this today.  We also discussed medication adjustments with followup blood work, also plan for possible elective cardioversion ultimately.   Allergies  Allergen Reactions  . Azithromycin Itching    Reaction in ED to administration of azithromycin. Patient stated he started itching.    Current Outpatient Prescriptions  Medication Sig Dispense Refill  . digoxin (LANOXIN) 0.125 MG tablet Take 1 tablet (0.125 mg total) by mouth daily.  30 tablet  2  . famotidine (PEPCID) 20 MG tablet Take 1 tablet (20 mg total) by mouth daily.  30 tablet  2  . furosemide (LASIX) 40 MG tablet Take 1 tablet (40 mg total) by mouth daily.  30 tablet  2  . lisinopril (PRINIVIL,ZESTRIL) 2.5 MG tablet Take 1 tablet (2.5 mg total) by mouth daily.  30 tablet  2  . potassium chloride (K-DUR,KLOR-CON) 10 MEQ tablet Take 1 tablet (10 mEq total) by mouth daily.  30 tablet  2  . warfarin (COUMADIN) 5 MG tablet TAKE  1 AND 1/2 TABLETS EACH EVENING OR AS DIRECTED.  45 tablet  2  . carvedilol (COREG) 25 MG tablet Take 1 tablet (25 mg total) by mouth 2 (two) times daily.  180 tablet  1    Past Medical History  Diagnosis Date  . Chronic systolic heart failure   . Atrial fibrillation   . Chronic kidney disease, stage 2, mildly decreased GFR   . Alcohol abuse   . TIA (transient ischemic attack)   . Nonischemic cardiomyopathy     LVEF 20-25%    Social History David [name] reports that he has never  smoked. He does not have any smokeless tobacco history on file. David [name] reports that he drinks alcohol.  Review of Systems No bleeding problems on Coumadin. Stable appetite. Otherwise negative except as outlined.  Physical Examination Filed Vitals:   08/31/11 0817  BP: 113/71  Pulse: 70  Resp: 16   Normally nourished appearing male in no acute distress. HEENT: Conjunctiva and lids normal, oropharynx clear. Neck: Supple, no elevated JVP or carotid bruits, no thyromegaly. Lungs: Clear to auscultation, nonlabored breathing at rest. Cardiac: Irregularly irregular, no S3 or significant systolic murmur, no pericardial rub. Abdomen: Soft, nontender, bowel sounds present, no guarding or rebound. Extremities: No pitting edema, distal pulses 2+. Skin: Warm and dry. Musculoskeletal: No kyphosis. Neuropsychiatric: Alert and oriented x3, affect grossly appropriate.   Problem List and Plan   Atrial fibrillation Relatively stable at this point with good heart rate control. He reports compliance with medications, tolerating Coumadin. We discussed a plan for elective cardioversion in an attempt to restore sinus rhythm. INR will be checked weekly through the Coumadin clinic, and after 4 weeks of therapeutic levels, we can consider DCCV.  Secondary cardiomyopathy, unspecified Nonischemic cardiomyopathy with improvement in LVEF to 50% based on recent echocardiogram. Would like to simplify medications. Discontinue diltiazem with concurrent increase in Coreg to 25 mg twice daily. Continue digoxin, although followup on digoxin level with CKD.  CKD (chronic kidney disease) stage  2, GFR 60-89 ml/min Followup BMET. Labwork from March was reviewed today.     Jonelle Sidle, M.D., F.A.C.C.

## 2011-08-31 NOTE — Assessment & Plan Note (Signed)
Followup BMET. Labwork from March was reviewed today.

## 2011-09-01 LAB — DIGOXIN LEVEL: Digoxin Level: 1.3 ng/mL (ref 0.8–2.0)

## 2011-09-06 ENCOUNTER — Other Ambulatory Visit: Payer: Self-pay

## 2011-09-06 ENCOUNTER — Ambulatory Visit (INDEPENDENT_AMBULATORY_CARE_PROVIDER_SITE_OTHER): Payer: Self-pay | Admitting: *Deleted

## 2011-09-06 DIAGNOSIS — I4891 Unspecified atrial fibrillation: Secondary | ICD-10-CM

## 2011-09-06 DIAGNOSIS — Z7901 Long term (current) use of anticoagulants: Secondary | ICD-10-CM

## 2011-09-06 DIAGNOSIS — G459 Transient cerebral ischemic attack, unspecified: Secondary | ICD-10-CM

## 2011-09-06 LAB — POCT INR: INR: 2.4

## 2011-09-06 MED ORDER — POTASSIUM CHLORIDE CRYS ER 10 MEQ PO TBCR: 10.0000 meq | EXTENDED_RELEASE_TABLET | Freq: Every day | ORAL | Status: DC

## 2011-09-06 MED ORDER — LISINOPRIL 2.5 MG PO TABS: 2.5000 mg | ORAL_TABLET | Freq: Every day | ORAL | Status: DC

## 2011-09-06 MED ORDER — DIGOXIN 125 MCG PO TABS: 0.1250 mg | ORAL_TABLET | Freq: Every day | ORAL | Status: DC

## 2011-09-06 MED ORDER — WARFARIN SODIUM 5 MG PO TABS: ORAL_TABLET | ORAL | Status: DC

## 2011-09-06 MED ORDER — FUROSEMIDE 40 MG PO TABS: 40.0000 mg | ORAL_TABLET | Freq: Every day | ORAL | Status: DC

## 2011-09-06 MED ORDER — CARVEDILOL 25 MG PO TABS: 25.0000 mg | ORAL_TABLET | Freq: Two times a day (BID) | ORAL | Status: DC

## 2011-09-13 ENCOUNTER — Ambulatory Visit (INDEPENDENT_AMBULATORY_CARE_PROVIDER_SITE_OTHER): Payer: Self-pay | Admitting: *Deleted

## 2011-09-13 DIAGNOSIS — G459 Transient cerebral ischemic attack, unspecified: Secondary | ICD-10-CM

## 2011-09-13 DIAGNOSIS — Z7901 Long term (current) use of anticoagulants: Secondary | ICD-10-CM

## 2011-09-13 DIAGNOSIS — I4891 Unspecified atrial fibrillation: Secondary | ICD-10-CM

## 2011-09-13 LAB — POCT INR: INR: 2.7

## 2011-09-13 MED ORDER — FAMOTIDINE 20 MG PO TABS: 20.0000 mg | ORAL_TABLET | Freq: Every day | ORAL | Status: DC

## 2011-09-20 ENCOUNTER — Ambulatory Visit (INDEPENDENT_AMBULATORY_CARE_PROVIDER_SITE_OTHER): Payer: Self-pay | Admitting: *Deleted

## 2011-09-20 DIAGNOSIS — G459 Transient cerebral ischemic attack, unspecified: Secondary | ICD-10-CM

## 2011-09-20 DIAGNOSIS — I4891 Unspecified atrial fibrillation: Secondary | ICD-10-CM

## 2011-09-20 DIAGNOSIS — Z7901 Long term (current) use of anticoagulants: Secondary | ICD-10-CM

## 2011-09-20 LAB — POCT INR: INR: 2

## 2011-09-27 ENCOUNTER — Ambulatory Visit (INDEPENDENT_AMBULATORY_CARE_PROVIDER_SITE_OTHER): Payer: Self-pay | Admitting: *Deleted

## 2011-09-27 DIAGNOSIS — G459 Transient cerebral ischemic attack, unspecified: Secondary | ICD-10-CM

## 2011-09-27 DIAGNOSIS — Z7901 Long term (current) use of anticoagulants: Secondary | ICD-10-CM

## 2011-09-27 DIAGNOSIS — I4891 Unspecified atrial fibrillation: Secondary | ICD-10-CM

## 2011-09-27 LAB — POCT INR: INR: 3.8

## 2011-10-04 ENCOUNTER — Ambulatory Visit (INDEPENDENT_AMBULATORY_CARE_PROVIDER_SITE_OTHER): Payer: Self-pay | Admitting: Cardiology

## 2011-10-04 ENCOUNTER — Encounter: Payer: Self-pay | Admitting: Cardiology

## 2011-10-04 ENCOUNTER — Ambulatory Visit (INDEPENDENT_AMBULATORY_CARE_PROVIDER_SITE_OTHER): Payer: Self-pay | Admitting: *Deleted

## 2011-10-04 ENCOUNTER — Other Ambulatory Visit: Payer: Self-pay | Admitting: Cardiology

## 2011-10-04 VITALS — BP 125/75 | HR 49 | Ht 71.0 in | Wt 199.0 lb

## 2011-10-04 DIAGNOSIS — I4891 Unspecified atrial fibrillation: Secondary | ICD-10-CM

## 2011-10-04 DIAGNOSIS — Z7901 Long term (current) use of anticoagulants: Secondary | ICD-10-CM

## 2011-10-04 DIAGNOSIS — N182 Chronic kidney disease, stage 2 (mild): Secondary | ICD-10-CM

## 2011-10-04 DIAGNOSIS — G459 Transient cerebral ischemic attack, unspecified: Secondary | ICD-10-CM

## 2011-10-04 DIAGNOSIS — I429 Cardiomyopathy, unspecified: Secondary | ICD-10-CM

## 2011-10-04 LAB — POCT INR: INR: 3.1

## 2011-10-04 MED ORDER — CARVEDILOL 12.5 MG PO TABS: 12.5000 mg | ORAL_TABLET | Freq: Two times a day (BID) | ORAL | Status: DC

## 2011-10-04 NOTE — Assessment & Plan Note (Signed)
Improved LV function to 50% medical therapy.

## 2011-10-04 NOTE — Assessment & Plan Note (Signed)
Followup lab work shows creatinine 1.5.

## 2011-10-04 NOTE — Progress Notes (Signed)
Clinical Summary David [name] is a 51 y.o.male presenting for followup. He was seen back in May. He still experiences some dyspnea on exertion, occasional sense of palpitations. Heart rate is much slower today following medication adjustments last time.  Lab work back in May showed potassium 4.0, BUN 18, creatinine 1.5, digoxin level I.3. INR levels have been therapeutic over the last 4 weeks.  We have discussed elective DCCV, and he still is in agreement to proceed. This will be scheduled for early next week. We will be reducing his Coreg in the meanwhile.   Allergies  Allergen Reactions  . Azithromycin Itching    Reaction in ED to administration of azithromycin. Patient stated he started itching.    Current Outpatient Prescriptions  Medication Sig Dispense Refill  . carvedilol (COREG) 12.5 MG tablet Take 1 tablet (12.5 mg total) by mouth 2 (two) times daily.  60 tablet  6  . digoxin (LANOXIN) 0.125 MG tablet Take 1 tablet (0.125 mg total) by mouth daily.  30 tablet  6  . famotidine (PEPCID) 20 MG tablet Take 1 tablet (20 mg total) by mouth daily.  30 tablet  2  . furosemide (LASIX) 40 MG tablet Take 1 tablet (40 mg total) by mouth daily.  30 tablet  6  . lisinopril (PRINIVIL,ZESTRIL) 2.5 MG tablet Take 1 tablet (2.5 mg total) by mouth daily.  30 tablet  6  . potassium chloride (K-DUR,KLOR-CON) 10 MEQ tablet Take 1 tablet (10 mEq total) by mouth daily.  30 tablet  6  . warfarin (COUMADIN) 5 MG tablet TAKE  1 AND 1/2 TABLETS EACH EVENING OR AS DIRECTED.  45 tablet  2    Past Medical History  Diagnosis Date  . Atrial fibrillation   . Chronic kidney disease, stage 2, mildly decreased GFR   . Alcohol abuse   . TIA (transient ischemic attack)   . Nonischemic cardiomyopathy     LVEF 20-25% improved to 50% on medical therapy   Past Surgical History  Procedure Date  . Arthroscopic knee surgery 1987    Family History  Problem Relation Age of Onset  . Hypertension Mother   .  Hypertension Sister   . Hypertension Brother     Social History David [name] reports that he has never smoked. He does not have any smokeless tobacco history on file. David [name] reports that he drinks alcohol.  Review of Systems Negative except as outlined above.  Physical Examination Filed Vitals:   10/04/11 1435  BP: 125/75  Pulse: 49    Normally nourished appearing male in no acute distress.  HEENT: Conjunctiva and lids normal, oropharynx clear.  Neck: Supple, no elevated JVP or carotid bruits, no thyromegaly.  Lungs: Clear to auscultation, nonlabored breathing at rest.  Cardiac: Irregularly irregular, no S3 or significant systolic murmur, no pericardial rub.  Abdomen: Soft, nontender, bowel sounds present, no guarding or rebound.  Extremities: No pitting edema, distal pulses 2+.  Skin: Warm and dry.  Musculoskeletal: No kyphosis.  Neuropsychiatric: Alert and oriented x3, affect grossly appropriate.   Problem List and Plan   Atrial fibrillation Persistent. Continue Coumadin and plan for elective DCCV on Monday morning. He will have a stat PT/INR that day as well as ECG prior to proceeding. In the meanwhile would reduce Coreg to 12.5 mg twice daily.  Secondary cardiomyopathy, unspecified Improved LV function to 50% medical therapy.  CKD (chronic kidney disease) stage 2, GFR 60-89 ml/min Followup lab work shows creatinine 1.5.  Satira Sark, M.D., F.A.C.C.

## 2011-10-04 NOTE — Patient Instructions (Addendum)
**Note De-Identified Capri Raben Obfuscation** Your physician recommends that you continue on your current medications as directed. Please refer to the Current Medication list given to you today.  Your physician recommends that you schedule a follow-up appointment in: 1 month  Cardioversion scheduled on October 08, 2011 at 8 am, please arrive at short stay of [facility] at 7 am

## 2011-10-04 NOTE — Assessment & Plan Note (Signed)
Persistent. Continue Coumadin and plan for elective DCCV on Monday morning. He will have a stat PT/INR that day as well as ECG prior to proceeding. In the meanwhile would reduce Coreg to 12.5 mg twice daily.

## 2011-10-08 ENCOUNTER — Encounter (HOSPITAL_COMMUNITY): Payer: Self-pay | Admitting: *Deleted

## 2011-10-08 ENCOUNTER — Ambulatory Visit (INDEPENDENT_AMBULATORY_CARE_PROVIDER_SITE_OTHER): Payer: Self-pay | Admitting: *Deleted

## 2011-10-08 ENCOUNTER — Ambulatory Visit (HOSPITAL_COMMUNITY)
Admission: RE | Admit: 2011-10-08 | Discharge: 2011-10-08 | Disposition: A | Discharge status: Home / Self Care | Payer: Self-pay | Source: Ambulatory Visit | Attending: Cardiology | Admitting: Cardiology

## 2011-10-08 ENCOUNTER — Other Ambulatory Visit: Payer: Self-pay

## 2011-10-08 ENCOUNTER — Encounter (HOSPITAL_COMMUNITY)
Admission: RE | Disposition: A | Discharge status: Home / Self Care | Payer: Self-pay | Source: Ambulatory Visit | Attending: Cardiology

## 2011-10-08 DIAGNOSIS — I4891 Unspecified atrial fibrillation: Secondary | ICD-10-CM | POA: Insufficient documentation

## 2011-10-08 DIAGNOSIS — Z7901 Long term (current) use of anticoagulants: Secondary | ICD-10-CM | POA: Insufficient documentation

## 2011-10-08 HISTORY — PX: CARDIOVERSION: SHX1299

## 2011-10-08 LAB — PROTIME-INR
INR: 4.84 — ABNORMAL HIGH (ref 0.00–1.49)
Prothrombin Time: 45.9 seconds — ABNORMAL HIGH (ref 11.6–15.2)

## 2011-10-08 SURGERY — CARDIOVERSION
Anesthesia: Moderate Sedation | Wound class: Clean

## 2011-10-08 MED ORDER — MIDAZOLAM HCL 5 MG/5ML IJ SOLN
INTRAMUSCULAR | Status: AC
  Filled 2011-10-08: qty 10

## 2011-10-08 MED ORDER — SODIUM CHLORIDE 0.45 % IV SOLN
INTRAVENOUS | Status: DC
  Administered 2011-10-08: 1000 mL via INTRAVENOUS

## 2011-10-08 MED ORDER — MIDAZOLAM HCL 5 MG/5ML IJ SOLN
INTRAMUSCULAR | Status: AC | PRN
  Administered 2011-10-08 (×2): 2.5 mg via INTRAVENOUS

## 2011-10-08 MED ORDER — FENTANYL CITRATE 0.05 MG/ML IJ SOLN
INTRAMUSCULAR | Status: AC
  Filled 2011-10-08: qty 2

## 2011-10-08 MED ORDER — HYDROCORTISONE 1 % EX CREA
TOPICAL_CREAM | Freq: Two times a day (BID) | CUTANEOUS | Status: DC
  Administered 2011-10-08: 1 via TOPICAL
  Filled 2011-10-08: qty 1.5

## 2011-10-08 MED ORDER — FENTANYL CITRATE 0.05 MG/ML IJ SOLN
INTRAMUSCULAR | Status: AC | PRN
  Administered 2011-10-08: 25 ug via INTRAVENOUS

## 2011-10-08 NOTE — CV Procedure (Signed)
Elective cardioversion report  History reviewed, please refer to recent office visit note from 6/27. There has otherwise been no change in clinical status. David [name] has a history of persistent atrial fibrillation, managed with heart rate control and anticoagulation, therapeutic INR levels documented over the last 4 weeks. Most recent LVEF had improved to 50% on medical therapy. He is referred for an attempt at restoration of normal sinus rhythm.  After informed consent was obtained, patient was taken to the PACU. Anterior and posterior pads were placed in standard fashion. Sedation was achieved with a total of Versed 5 mg and fentanyl 25 mg. A single shock at 100 J was delivered using a biphasic defibrillator, with successful restoration of normal sinus rhythm. He was hemodynamically stable throughout, and otherwise tolerated the procedure well. Hydrocortisone 1% cream applied to the chest and back area.  Patient discharged in stable condition after post procedure observation. He will continue his present regimen, although Coumadin will be adjusted through our Coumadin clinic given supratherapeutic INR. Office followup was arranged.  Jonelle Sidle, M.D., F.A.C.C.

## 2011-10-08 NOTE — Discharge Instructions (Signed)
Follow up with Dr.  Diona Browner on October 29, 2011 @ 1:20 pm.  Hold Coumadin tonight.    Decrease dosage to 5 mg daily. Take 7 1/2 mg of coumadin on Sunday and Thursday. Follow up with the coumadin clinic on October 15, 2011 at 3:30 pm.

## 2011-10-08 NOTE — ED Notes (Signed)
MD at bedside. 

## 2011-10-08 NOTE — H&P (Signed)
 Clinical Summary David [name] is a 51 y.o.male presenting for followup. He was seen back in May. He still experiences some dyspnea on exertion, occasional sense of palpitations. Heart rate is much slower today following medication adjustments last time.  Lab work back in May showed potassium 4.0, BUN 18, creatinine 1.5, digoxin level I.3. INR levels have been therapeutic over the last 4 weeks.  We have discussed elective DCCV, and he still is in agreement to proceed. This will be scheduled for early next week. We will be reducing his Coreg in the meanwhile.   Allergies  Allergen Reactions  . Azithromycin Itching    Reaction in ED to administration of azithromycin. Patient stated he started itching.    Current Outpatient Prescriptions  Medication Sig Dispense Refill  . carvedilol (COREG) 12.5 MG tablet Take 1 tablet (12.5 mg total) by mouth 2 (two) times daily.  60 tablet  6  . digoxin (LANOXIN) 0.125 MG tablet Take 1 tablet (0.125 mg total) by mouth daily.  30 tablet  6  . famotidine (PEPCID) 20 MG tablet Take 1 tablet (20 mg total) by mouth daily.  30 tablet  2  . furosemide (LASIX) 40 MG tablet Take 1 tablet (40 mg total) by mouth daily.  30 tablet  6  . lisinopril (PRINIVIL,ZESTRIL) 2.5 MG tablet Take 1 tablet (2.5 mg total) by mouth daily.  30 tablet  6  . potassium chloride (K-DUR,KLOR-CON) 10 MEQ tablet Take 1 tablet (10 mEq total) by mouth daily.  30 tablet  6  . warfarin (COUMADIN) 5 MG tablet TAKE  1 AND 1/2 TABLETS EACH EVENING OR AS DIRECTED.  45 tablet  2    Past Medical History  Diagnosis Date  . Atrial fibrillation   . Chronic kidney disease, stage 2, mildly decreased GFR   . Alcohol abuse   . TIA (transient ischemic attack)   . Nonischemic cardiomyopathy     LVEF 20-25% improved to 50% on medical therapy   Past Surgical History  Procedure Date  . Arthroscopic knee surgery 1987    Family History  Problem Relation Age of Onset  . Hypertension Mother   .  Hypertension Sister   . Hypertension Brother     Social History Mr. Hackbart reports that he has never smoked. He does not have any smokeless tobacco history on file. Mr. Wahid reports that he drinks alcohol.  Review of Systems Negative except as outlined above.  Physical Examination Filed Vitals:   10/04/11 1435  BP: 125/75  Pulse: 49    Normally nourished appearing male in no acute distress.  HEENT: Conjunctiva and lids normal, oropharynx clear.  Neck: Supple, no elevated JVP or carotid bruits, no thyromegaly.  Lungs: Clear to auscultation, nonlabored breathing at rest.  Cardiac: Irregularly irregular, no S3 or significant systolic murmur, no pericardial rub.  Abdomen: Soft, nontender, bowel sounds present, no guarding or rebound.  Extremities: No pitting edema, distal pulses 2+.  Skin: Warm and dry.  Musculoskeletal: No kyphosis.  Neuropsychiatric: Alert and oriented x3, affect grossly appropriate.   Problem List and Plan   Atrial fibrillation Persistent. Continue Coumadin and plan for elective DCCV on Monday morning. He will have a stat PT/INR that day as well as ECG prior to proceeding. In the meanwhile would reduce Coreg to 12.5 mg twice daily.  Secondary cardiomyopathy, unspecified Improved LV function to 50% medical therapy.  CKD (chronic kidney disease) stage 2, GFR 60-89 ml/min Followup lab work shows creatinine 1.5.      David [name], M.D., F.A.C.C.   

## 2011-10-08 NOTE — ED Notes (Signed)
Cardioversion 100j per Dr. Diona Browner

## 2011-10-08 NOTE — ED Notes (Signed)
Family updated as to patient's status.

## 2011-10-09 ENCOUNTER — Encounter (HOSPITAL_COMMUNITY): Payer: Self-pay | Admitting: Anesthesiology

## 2011-10-10 ENCOUNTER — Encounter (HOSPITAL_COMMUNITY): Payer: Self-pay | Admitting: Cardiology

## 2011-10-15 ENCOUNTER — Ambulatory Visit (INDEPENDENT_AMBULATORY_CARE_PROVIDER_SITE_OTHER): Payer: Self-pay | Admitting: *Deleted

## 2011-10-15 DIAGNOSIS — Z7901 Long term (current) use of anticoagulants: Secondary | ICD-10-CM

## 2011-10-15 DIAGNOSIS — G459 Transient cerebral ischemic attack, unspecified: Secondary | ICD-10-CM

## 2011-10-15 DIAGNOSIS — I4891 Unspecified atrial fibrillation: Secondary | ICD-10-CM

## 2011-10-15 LAB — POCT INR: INR: 2.5

## 2011-10-15 NOTE — Addendum Note (Signed)
Addended by: Velda Shell on: 10/15/2011 04:34 PM   Modules accepted: Orders

## 2011-10-29 ENCOUNTER — Ambulatory Visit (INDEPENDENT_AMBULATORY_CARE_PROVIDER_SITE_OTHER): Payer: Self-pay | Admitting: Adult Health

## 2011-10-29 ENCOUNTER — Encounter: Payer: Self-pay | Admitting: Adult Health

## 2011-10-29 ENCOUNTER — Ambulatory Visit (INDEPENDENT_AMBULATORY_CARE_PROVIDER_SITE_OTHER): Payer: Self-pay | Admitting: *Deleted

## 2011-10-29 VITALS — BP 128/80 | HR 74 | Wt 198.0 lb

## 2011-10-29 DIAGNOSIS — I4891 Unspecified atrial fibrillation: Secondary | ICD-10-CM

## 2011-10-29 DIAGNOSIS — G459 Transient cerebral ischemic attack, unspecified: Secondary | ICD-10-CM

## 2011-10-29 DIAGNOSIS — Z7901 Long term (current) use of anticoagulants: Secondary | ICD-10-CM

## 2011-10-29 LAB — POCT INR: INR: 2.5

## 2011-10-29 MED ORDER — CARVEDILOL 25 MG PO TABS: 25.0000 mg | ORAL_TABLET | Freq: Two times a day (BID) | ORAL | Status: DC

## 2011-10-29 MED ORDER — DIGOXIN 125 MCG PO TABS: 0.1250 mg | ORAL_TABLET | Freq: Every day | ORAL | Status: DC

## 2011-10-29 NOTE — Assessment & Plan Note (Addendum)
I have discussed possible EP consultation with Dr. Ladona Ridgel for possibility of antiarrhythmic therapy, and if not effective possible referral for ablation. I have spoken with Dr. Diona Browner by phone concerning referral - the patient's history of structural heart disease, will limit choice of antiarrhythmic, although he has had improvement in LVEF to 50%. Possibilities would include amiodarone and Tikosyn, although would prefer to hold off amiodarone in light of patient's young age and risk for side effects over time. He does not have health insurance, and with limited resources, not certain about his ability to consider hospitalization for Tikosyn, and cost going forward. The patient is willing to have a conversation with Dr.Taylor for his recommendations. I have therefore referred him. He will follow up with Dr. Diona Browner or myself after seeing EP. In the interim, he will continue current medication doses, and PT INR checks in our office. He is tolerating the medications well. Heart rate is well controlled.

## 2011-10-29 NOTE — Progress Notes (Signed)
   HPI: Mr. Zetina is a pleasant 78 y/po patient of Dr. Diona Browner we are following post DCCV per Dr. Diona Browner on 10/08/2011 secondary to persistent atrial fibrillation. He continues on coumadin, digoxin and increased dose of coreg to 25 mg BID from coreg 12. 5 mg BID post DCCV. He states he noticed that his heart was back to an irregular rhythm a few days post procedure. He remains medically complaint. He denies dizziness or DOE, chest pain or bleeding.   Allergies  Allergen Reactions  . Azithromycin Itching    Reaction in ED to administration of azithromycin. Patient stated he started itching.    Current Outpatient Prescriptions  Medication Sig Dispense Refill  . carvedilol (COREG) 25 MG tablet Take 1 tablet (25 mg total) by mouth 2 (two) times daily.  60 tablet  12  . digoxin (LANOXIN) 0.125 MG tablet Take 1 tablet (0.125 mg total) by mouth daily.  30 tablet  12  . famotidine (PEPCID) 20 MG tablet Take 1 tablet (20 mg total) by mouth daily.  30 tablet  2  . furosemide (LASIX) 40 MG tablet Take 1 tablet (40 mg total) by mouth daily.  30 tablet  6  . lisinopril (PRINIVIL,ZESTRIL) 2.5 MG tablet Take 1 tablet (2.5 mg total) by mouth daily.  30 tablet  6  . potassium chloride (K-DUR,KLOR-CON) 10 MEQ tablet Take 1 tablet (10 mEq total) by mouth daily.  30 tablet  6  . warfarin (COUMADIN) 5 MG tablet TAKE  1 AND 1/2 TABLETS EACH EVENING OR AS DIRECTED.  45 tablet  2  . DISCONTD: digoxin (LANOXIN) 0.125 MG tablet Take 1 tablet (0.125 mg total) by mouth daily.  30 tablet  6    Past Medical History  Diagnosis Date  . Atrial fibrillation   . Chronic kidney disease, stage 2, mildly decreased GFR   . Alcohol abuse   . TIA (transient ischemic attack)   . Nonischemic cardiomyopathy     LVEF 20-25% improved to 50% on medical therapy    Past Surgical History  Procedure Date  . Arthroscopic knee surgery 1987  . Cardioversion 10/08/2011    Procedure: CARDIOVERSION;  Surgeon: Jonelle Sidle, MD;   Location: [facility];  Service: Cardiovascular;  Laterality: N/A;  Done in PACU    WUJ:WJXBJY of systems complete and found to be negative unless listed above  PHYSICAL EXAM BP 128/80  Pulse 74  Wt 198 lb (89.812 kg)  General: Well developed, well nourished, in no acute distress Head: Eyes PERRLA, No xanthomas.   Normal cephalic and atramatic  Lungs: Clear bilaterally to auscultation and percussion. Heart: HRIR , without MRG.  Pulses are 2+ & equal.            No carotid bruit. No JVD.  No abdominal bruits. No femoral bruits. Abdomen: Bowel sounds are positive, abdomen soft and non-tender without masses or                  Hernia's noted. Msk:  Back normal, normal gait. Normal strength and tone for age. Extremities: No clubbing, cyanosis or edema.  DP +1 Neuro: Alert and oriented X 3. Psych:  Good affect, responds appropriately  NWG:NFAOZH strip atrial fib with rate of 74 bpm.   ASSESSMENT AND PLAN

## 2011-10-29 NOTE — Patient Instructions (Signed)
Your physician recommends that you schedule a follow-up appointment in: After evaluation by Dr Ladona Ridgel

## 2011-11-12 ENCOUNTER — Ambulatory Visit (INDEPENDENT_AMBULATORY_CARE_PROVIDER_SITE_OTHER): Payer: Self-pay | Admitting: *Deleted

## 2011-11-12 DIAGNOSIS — Z7901 Long term (current) use of anticoagulants: Secondary | ICD-10-CM

## 2011-11-12 DIAGNOSIS — G459 Transient cerebral ischemic attack, unspecified: Secondary | ICD-10-CM

## 2011-11-12 DIAGNOSIS — I4891 Unspecified atrial fibrillation: Secondary | ICD-10-CM

## 2011-11-12 LAB — POCT INR: INR: 3

## 2011-11-12 MED ORDER — POTASSIUM CHLORIDE CRYS ER 10 MEQ PO TBCR: 10.0000 meq | EXTENDED_RELEASE_TABLET | Freq: Every day | ORAL | Status: DC

## 2011-11-12 MED ORDER — FAMOTIDINE 20 MG PO TABS: 20.0000 mg | ORAL_TABLET | Freq: Every day | ORAL | Status: DC

## 2011-11-12 MED ORDER — LISINOPRIL 2.5 MG PO TABS: 2.5000 mg | ORAL_TABLET | Freq: Every day | ORAL | Status: DC

## 2011-11-12 MED ORDER — FUROSEMIDE 40 MG PO TABS: 40.0000 mg | ORAL_TABLET | Freq: Every day | ORAL | Status: DC

## 2011-11-13 ENCOUNTER — Emergency Department (HOSPITAL_COMMUNITY)
Admission: EM | Admit: 2011-11-13 | Discharge: 2011-11-13 | Disposition: A | Discharge status: Home / Self Care | Payer: Self-pay | Attending: Emergency Medicine | Admitting: Emergency Medicine

## 2011-11-13 ENCOUNTER — Encounter (HOSPITAL_COMMUNITY): Payer: Self-pay | Admitting: *Deleted

## 2011-11-13 ENCOUNTER — Emergency Department (HOSPITAL_COMMUNITY): Payer: Self-pay

## 2011-11-13 DIAGNOSIS — R040 Epistaxis: Secondary | ICD-10-CM | POA: Insufficient documentation

## 2011-11-13 DIAGNOSIS — I4891 Unspecified atrial fibrillation: Secondary | ICD-10-CM | POA: Insufficient documentation

## 2011-11-13 DIAGNOSIS — W219XXA Striking against or struck by unspecified sports equipment, initial encounter: Secondary | ICD-10-CM | POA: Insufficient documentation

## 2011-11-13 DIAGNOSIS — N182 Chronic kidney disease, stage 2 (mild): Secondary | ICD-10-CM | POA: Insufficient documentation

## 2011-11-13 DIAGNOSIS — I428 Other cardiomyopathies: Secondary | ICD-10-CM | POA: Insufficient documentation

## 2011-11-13 DIAGNOSIS — S0230XB Fracture of orbital floor, unspecified side, initial encounter for open fracture: Secondary | ICD-10-CM | POA: Insufficient documentation

## 2011-11-13 DIAGNOSIS — Z7901 Long term (current) use of anticoagulants: Secondary | ICD-10-CM | POA: Insufficient documentation

## 2011-11-13 DIAGNOSIS — Y9367 Activity, basketball: Secondary | ICD-10-CM | POA: Insufficient documentation

## 2011-11-13 DIAGNOSIS — F1010 Alcohol abuse, uncomplicated: Secondary | ICD-10-CM | POA: Insufficient documentation

## 2011-11-13 DIAGNOSIS — Z8673 Personal history of transient ischemic attack (TIA), and cerebral infarction without residual deficits: Secondary | ICD-10-CM | POA: Insufficient documentation

## 2011-11-13 DIAGNOSIS — Z79899 Other long term (current) drug therapy: Secondary | ICD-10-CM | POA: Insufficient documentation

## 2011-11-13 LAB — CBC WITH DIFFERENTIAL/PLATELET
Basophils Absolute: 0 10*3/uL (ref 0.0–0.1)
Basophils Relative: 0 % (ref 0–1)
Eosinophils Absolute: 0.2 10*3/uL (ref 0.0–0.7)
Eosinophils Relative: 3 % (ref 0–5)
HCT: 41.4 % (ref 39.0–52.0)
Hemoglobin: 14.6 g/dL (ref 13.0–17.0)
Lymphocytes Relative: 30 % (ref 12–46)
Lymphs Abs: 2.1 10*3/uL (ref 0.7–4.0)
MCH: 32.7 pg (ref 26.0–34.0)
MCHC: 35.3 g/dL (ref 30.0–36.0)
MCV: 92.6 fL (ref 78.0–100.0)
Monocytes Absolute: 0.9 10*3/uL (ref 0.1–1.0)
Monocytes Relative: 13 % — ABNORMAL HIGH (ref 3–12)
Neutro Abs: 3.7 10*3/uL (ref 1.7–7.7)
Neutrophils Relative %: 54 % (ref 43–77)
Platelets: 270 10*3/uL (ref 150–400)
RBC: 4.47 MIL/uL (ref 4.22–5.81)
RDW: 12 % (ref 11.5–15.5)
WBC: 6.9 10*3/uL (ref 4.0–10.5)

## 2011-11-13 LAB — BASIC METABOLIC PANEL
BUN: 16 mg/dL (ref 6–23)
CO2: 26 mEq/L (ref 19–32)
Calcium: 9.6 mg/dL (ref 8.4–10.5)
Chloride: 97 mEq/L (ref 96–112)
Creatinine, Ser: 1.51 mg/dL — ABNORMAL HIGH (ref 0.50–1.35)
GFR calc Af Amer: 61 mL/min — ABNORMAL LOW (ref >90)
GFR calc non Af Amer: 53 mL/min — ABNORMAL LOW (ref >90)
Glucose, Bld: 142 mg/dL — ABNORMAL HIGH (ref 70–99)
Potassium: 3.7 mEq/L (ref 3.5–5.1)
Sodium: 137 mEq/L (ref 135–145)

## 2011-11-13 LAB — PROTIME-INR
INR: 2.41 — ABNORMAL HIGH (ref 0.00–1.49)
Prothrombin Time: 26.6 seconds — ABNORMAL HIGH (ref 11.6–15.2)

## 2011-11-13 MED ORDER — AMOXICILLIN-POT CLAVULANATE 875-125 MG PO TABS: 1.0000 | ORAL_TABLET | Freq: Two times a day (BID) | ORAL | Status: DC

## 2011-11-13 MED ORDER — HYDROCODONE-ACETAMINOPHEN 5-325 MG PO TABS
1.0000 | ORAL_TABLET | Freq: Once | ORAL | Status: AC
  Administered 2011-11-13: 1 via ORAL
  Filled 2011-11-13: qty 1

## 2011-11-13 MED ORDER — HYDROCODONE-ACETAMINOPHEN 5-325 MG PO TABS: 2.0000 | ORAL_TABLET | ORAL | Status: DC | PRN

## 2011-11-13 MED ORDER — HYDROCODONE-ACETAMINOPHEN 5-325 MG PO TABS: 1.0000 | ORAL_TABLET | Freq: Once | ORAL | Status: DC

## 2011-11-13 MED ORDER — FLUORESCEIN SODIUM 1 MG OP STRP
1.0000 | ORAL_STRIP | Freq: Once | OPHTHALMIC | Status: AC
  Administered 2011-11-13: 1 via OPHTHALMIC
  Filled 2011-11-13 (×2): qty 1

## 2011-11-13 MED ORDER — TETRACAINE HCL 0.5 % OP SOLN
2.0000 [drp] | Freq: Once | OPHTHALMIC | Status: AC
  Administered 2011-11-13: 2 [drp] via OPHTHALMIC
  Filled 2011-11-13 (×2): qty 2

## 2011-11-13 NOTE — ED Notes (Addendum)
Pt was playing ball yesterday when he was hit in left eye with elbow, pt c/o pain and swelling to left eye, pt also experiences double vision at times. Pt also states that when he blows his nose he feels pain and pressure on left eye, does admit to feeling like his nose is "running" more today than usual, pt denies any loc but states that he was not able to see out of left eye for 3-4 minutes after the initial hit

## 2011-11-13 NOTE — ED Notes (Signed)
Advised patient to make appointments to be followed up and get meds for pain and infection.

## 2011-11-13 NOTE — ED Provider Notes (Signed)
History  This chart was scribed for Glynn Octave, MD by Erskine Emery. This patient was seen in room [facility] and the patient's care was started at 17:21.   CSN: [id number]  Arrival date & time 11/13/11  1654   First MD Initiated Contact with Patient 11/13/11 1721      Chief Complaint  Patient presents with  . Eye Injury    (Consider location/radiation/quality/duration/timing/severity/associated sxs/prior treatment) HPI David [name] is a 51 y.o. male who presents to the Emergency Department complaining of left eye pain with associated eye bulging, visual disturbance, and intermittent nose bleeds since yesterday when he was elbowed in that eye. Pt reports he was playing basketball and got an elbow in the eye, after which he could not see for about 3-4 minutes. Pt reports the pain is aggravated by turning his head but not by moving the eye. Pt denies any associated neck pain, chest pain, SOB, or any previous eye issues. Pt has no contacts or glasses and is not diabetic. Pt took 2 Ibuprofen last night before bed and 2 again this morning. Then, at about 3:30pm, he blew his nose, felt pressure behind his eye again, and has been in moderate pain ever since. Pt has a h/o Atrial fibrilation for which he is on coumadin.   Past Medical History  Diagnosis Date  . Atrial fibrillation   . Chronic kidney disease, stage 2, mildly decreased GFR   . Alcohol abuse   . TIA (transient ischemic attack)   . Nonischemic cardiomyopathy     LVEF 20-25% improved to 50% on medical therapy    Past Surgical History  Procedure Date  . Arthroscopic knee surgery 1987  . Cardioversion 10/08/2011    Procedure: CARDIOVERSION;  Surgeon: Jonelle Sidle, MD;  Location: [facility];  Service: Cardiovascular;  Laterality: N/A;  Done in PACU    Family History  Problem Relation Age of Onset  . Hypertension Mother   . Hypertension Sister   . Hypertension Brother     History  Substance Use Topics  . Smoking  status: Never Smoker   . Smokeless tobacco: Not on file  . Alcohol Use: Yes     History of daily use      Review of Systems A complete 10 system review of systems was obtained and all systems are negative except as noted in the HPI and PMH.    Allergies  Azithromycin  Home Medications   Current Outpatient Rx  Name Route Sig Dispense Refill  . CARVEDILOL 25 MG PO TABS Oral Take 1 tablet (25 mg total) by mouth 2 (two) times daily. 60 tablet 12  . DIGOXIN 0.125 MG PO TABS Oral Take 1 tablet (0.125 mg total) by mouth daily. 30 tablet 12  . FAMOTIDINE 20 MG PO TABS Oral Take 1 tablet (20 mg total) by mouth daily. 30 tablet 2  . FUROSEMIDE 40 MG PO TABS Oral Take 1 tablet (40 mg total) by mouth daily. 30 tablet 6  . IBUPROFEN 200 MG PO TABS Oral Take 400 mg by mouth as needed. For pain    . LISINOPRIL 2.5 MG PO TABS Oral Take 1 tablet (2.5 mg total) by mouth daily. 30 tablet 6  . POTASSIUM CHLORIDE CRYS ER 10 MEQ PO TBCR Oral Take 1 tablet (10 mEq total) by mouth daily. 30 tablet 6  . WARFARIN SODIUM 5 MG PO TABS Oral Take 5-7.5 mg by mouth as directed. *Take one & one-half tablet (7.5mg ) on Sundays, Tuesdays, and  Thursdays. Take one tablet (5mg ) on all other days. Takes dose between 7pm and 9pm daily    . AMOXICILLIN-POT CLAVULANATE 875-125 MG PO TABS Oral Take 1 tablet by mouth every 12 (twelve) hours. 14 tablet 0  . HYDROCODONE-ACETAMINOPHEN 5-325 MG PO TABS Oral Take 2 tablets by mouth every 4 (four) hours as needed for pain. 10 tablet 0    Triage Vitals: BP 117/64  Pulse 57  Temp 97.7 F (36.5 C)  Resp 18  Ht 5\' 11"  (1.803 m)  Wt 198 lb (89.812 kg)  BMI 27.62 kg/m2  SpO2 96%  Physical Exam  Nursing note and vitals reviewed. Constitutional: He is oriented to person, place, and time. He appears well-developed and well-nourished. No distress.  HENT:  Head: Normocephalic and atraumatic.  Eyes: EOM are normal. Pupils are equal, round, and reactive to light.  Fundoscopic  exam:      The left eye shows no hemorrhage.       Mild proctsis on left eye with swelling, superior and inferior. Subconjunctival hemorrhage on right medial sclera. Retina appears normal bilaterally. No vitral hemorrhage. Negative Seidel sign. Anterior chamber is clear.  Left pupil 4 mm reactive, R pupil 3 mm reactive  Neck: Neck supple. No tracheal deviation present.  Cardiovascular: Normal rate and normal heart sounds.   Pulmonary/Chest: Effort normal and breath sounds normal. No respiratory distress.  Abdominal: Soft. He exhibits no distension. There is no tenderness.  Musculoskeletal: Normal range of motion. He exhibits no edema.  Neurological: He is alert and oriented to person, place, and time.  Skin: Skin is warm and dry.  Psychiatric: He has a normal mood and affect.    ED Course  Procedures (including critical care time) DIAGNOSTIC STUDIES: Oxygen Saturation is 96% on room air, adequate by my interpretation.    COORDINATION OF CARE: 17:27--I evaluated the patient and we discussed a treatment plan including head and face x-rays, and a coumadin level check to which the pt agreed.   17:30--Medication order: Tetracaine (Pontocaine) 0.5% opthalmic solution, 2 drops--once  17:38--I rechecked the pt who's vision is still blurry.   17:45--Medication order: Fluorescein opthalmic strip, 1 strip--once  17:46--Performed slit lamp procedure:  15 on the right, 14 on the left No fluorescein uptake Negative Seidel sign Anterior chamber is clear.  no cells flare  no hyphema  18:10--I rechecked the pt.  18:43--I rechecked the pt.   19:01--I rechecked the pt. I told him to come back if he notices any acute changes in vision. He is not seeing double. I instructed him to follow up with the ENT doctor on Thursday and not to blow his nose. He is 15 to both left and right at this time. Pt reports he thinks his last tetanus shot was in March.  Labs Reviewed  CBC WITH DIFFERENTIAL -  Abnormal; Notable for the following:    Monocytes Relative 13 (*)     All other components within normal limits  BASIC METABOLIC PANEL - Abnormal; Notable for the following:    Glucose, Bld 142 (*)     Creatinine, Ser 1.51 (*)     GFR calc non Af Amer 53 (*)     GFR calc Af Amer 61 (*)     All other components within normal limits  PROTIME-INR - Abnormal; Notable for the following:    Prothrombin Time 26.6 (*)     INR 2.41 (*)     All other components within normal limits   Ct Head Wo  Contrast  11/13/2011  *RADIOLOGY REPORT*  Clinical Data:  Right facial injury.  Diplopia.  Running nose. History of transient ischemic attacks.  Pain.  CT HEAD WITHOUT CONTRAST CT MAXILLOFACIAL WITHOUT CONTRAST  Technique:  Multidetector CT imaging of the head and maxillofacial structures were performed using the standard protocol without intravenous contrast. Multiplanar CT image reconstructions of the maxillofacial structures were also generated.  Comparison:  06/15/2011  CT HEAD  Findings: Left medial orbital wall fracture noted with extensive intraconal and extraconal gas on the left.  Medial rectus muscle is partially herniated into the left ethmoid air cells.  No pneumocephalus noted. The brain stem, cerebellum, cerebral peduncles, thalami, basal ganglia, basilar cisterns, and ventricular system appear unremarkable.  No intracranial hemorrhage, mass lesion, or acute infarction is identified.  IMPRESSION:  1.  Left medial orbital wall fracture with herniated medial rectus muscle and considerable intraconal and extraconal gas in the left orbit.  No acute findings in the cranial vault.  CT MAXILLOFACIAL  Findings:   Segmental medial orbital wall fracture noted on the left, with herniation of a significant portion of the medial rectus muscle into the ethmoid air cells, and intraconal and extraconal gas in the left orbit.  There is also gas tracking in the left eyelid.  The globes appear relatively symmetric.   Periorbital gas is present on the left.  The orbital floor appears intact, as does the orbital roof, and no definite extension to the orbital rim is identified.  On axial images, the zygomatic arches appear intact and the pterygoid plates appear unremarkable.  No definite nasal fracture observed.  There is opacification of the ethmoid air cells adjacent to the medial orbital wall fracture.  No fluid is observed in the middle ears or mastoid air cells, and the sphenoid sinuses appear normal.  No discrete alveolar ridge fracture is identified.  Multiple medial mandibular osteomas versus dysplastic cortical thickening of the medial mandible noted bilaterally.  IMPRESSION:  1.  Segmental medial orbital wall fracture on the left with partial herniation of the medial rectus muscle. There is periorbital gas as well as intraorbital/extraconal and intraorbital/intraconal gas. 2.  Dysplastic cortical thickening of the medial mandibles bilaterally.  Original Report Authenticated By: Dellia Cloud, M.D.   Ct Maxillofacial Wo Cm  11/13/2011  *RADIOLOGY REPORT*  Clinical Data:  Right facial injury.  Diplopia.  Running nose. History of transient ischemic attacks.  Pain.  CT HEAD WITHOUT CONTRAST CT MAXILLOFACIAL WITHOUT CONTRAST  Technique:  Multidetector CT imaging of the head and maxillofacial structures were performed using the standard protocol without intravenous contrast. Multiplanar CT image reconstructions of the maxillofacial structures were also generated.  Comparison:  06/15/2011  CT HEAD  Findings: Left medial orbital wall fracture noted with extensive intraconal and extraconal gas on the left.  Medial rectus muscle is partially herniated into the left ethmoid air cells.  No pneumocephalus noted. The brain stem, cerebellum, cerebral peduncles, thalami, basal ganglia, basilar cisterns, and ventricular system appear unremarkable.  No intracranial hemorrhage, mass lesion, or acute infarction is identified.   IMPRESSION:  1.  Left medial orbital wall fracture with herniated medial rectus muscle and considerable intraconal and extraconal gas in the left orbit.  No acute findings in the cranial vault.  CT MAXILLOFACIAL  Findings:   Segmental medial orbital wall fracture noted on the left, with herniation of a significant portion of the medial rectus muscle into the ethmoid air cells, and intraconal and extraconal gas in the left orbit.  There  is also gas tracking in the left eyelid.  The globes appear relatively symmetric.  Periorbital gas is present on the left.  The orbital floor appears intact, as does the orbital roof, and no definite extension to the orbital rim is identified.  On axial images, the zygomatic arches appear intact and the pterygoid plates appear unremarkable.  No definite nasal fracture observed.  There is opacification of the ethmoid air cells adjacent to the medial orbital wall fracture.  No fluid is observed in the middle ears or mastoid air cells, and the sphenoid sinuses appear normal.  No discrete alveolar ridge fracture is identified.  Multiple medial mandibular osteomas versus dysplastic cortical thickening of the medial mandible noted bilaterally.  IMPRESSION:  1.  Segmental medial orbital wall fracture on the left with partial herniation of the medial rectus muscle. There is periorbital gas as well as intraorbital/extraconal and intraorbital/intraconal gas. 2.  Dysplastic cortical thickening of the medial mandibles bilaterally.  Original Report Authenticated By: Dellia Cloud, M.D.     1. Orbital floor (blow-out) open fracture       MDM  Blunt trauma to L eye from elbow.  "could not see" for 3-4 minutes afterward, now with blurry vision, double at times, pain behind eye with blowing nose. On coumadin for history of Afib.  EOMI without pain. IOP wnl bilaterally. No fluoroscein uptake. Negative Seidel's sign. Anterior chamber clear, no hyphema. Creatinine stable. INR  2.4 Concern for retrobulbar hematoma, especially given coumadin use. IOPs normal. No ICH.  Medial orbital wall fracture with herniation of medial rectus. No retrobulbar hematoma.  D/w Dr. Lita Mains of ophthalmology. IOP, visual acuity, and exam findings relayed.  I expressed my concerns over delayed bleeding given patient's Coumadin use. Dr. Lita Mains states patient's injury occurred greater than 24 hours ago and any hematoma should be visible by now. He suggested treating patient as an orbital fracture with ENT followup. Encourage patient to return with any change in his vision.  D/w Dr. Suszanne Conners.  Will see patient in [place] office on Thursday 8/8 at 1 pm.  Clinically no entrapment but has slight pain with EOMI. Patient encouraged to return with worsening swelling or visual change  CRITICAL CARE Performed by: Glynn Octave   Total critical care time: 30  Critical care time was exclusive of separately billable procedures and treating other patients.  Critical care was necessary to treat or prevent imminent or life-threatening deterioration.  Critical care was time spent personally by me on the following activities: development of treatment plan with patient and/or surrogate as well as nursing, discussions with consultants, evaluation of patient's response to treatment, examination of patient, obtaining history from patient or surrogate, ordering and performing treatments and interventions, ordering and review of laboratory studies, ordering and review of radiographic studies, pulse oximetry and re-evaluation of patient's condition.  I personally performed the services described in this documentation, which was scribed in my presence.  The recorded information has been reviewed and considered.     Glynn Octave, MD 11/14/11 [phone]

## 2011-11-14 NOTE — ED Notes (Signed)
Spoke with patient about referral to see Dr Suszanne Conners. States when he contacted the office they were not aware of an appointment or referral. Spoke with Dr Suszanne Conners office and was informed he was not on call for facial and eye injuries this week, Advised Dr Kelly Splinter was on call. Attempted to call patient back x 3 without any success, Will advised patient if he returns call to come to the ED for another referral. Contact number [phone]

## 2011-11-15 ENCOUNTER — Ambulatory Visit (INDEPENDENT_AMBULATORY_CARE_PROVIDER_SITE_OTHER): Payer: Self-pay | Admitting: Otolaryngology

## 2011-11-15 DIAGNOSIS — S0230XA Fracture of orbital floor, unspecified side, initial encounter for closed fracture: Secondary | ICD-10-CM

## 2011-11-23 ENCOUNTER — Ambulatory Visit (INDEPENDENT_AMBULATORY_CARE_PROVIDER_SITE_OTHER): Payer: Self-pay | Admitting: Internal Medicine

## 2011-11-23 ENCOUNTER — Encounter: Payer: Self-pay | Admitting: Internal Medicine

## 2011-11-23 VITALS — BP 120/90 | HR 54 | Ht 71.0 in | Wt 201.0 lb

## 2011-11-23 DIAGNOSIS — I4891 Unspecified atrial fibrillation: Secondary | ICD-10-CM

## 2011-11-23 MED ORDER — FUROSEMIDE 40 MG PO TABS: 40.0000 mg | ORAL_TABLET | ORAL | Status: DC

## 2011-11-23 MED ORDER — CARVEDILOL 25 MG PO TABS: 25.0000 mg | ORAL_TABLET | Freq: Two times a day (BID) | ORAL | Status: DC

## 2011-11-23 MED ORDER — AMIODARONE HCL 200 MG PO TABS: 200.0000 mg | ORAL_TABLET | Freq: Two times a day (BID) | ORAL | Status: DC

## 2011-11-23 NOTE — Patient Instructions (Signed)
Stop taking Digoxin in 1 week.  Change Lasix to 1 tablet every other day.  Your physician recommends that you schedule a follow-up appointment in: 2 weeks with the nurse for an EKG.  Your physician recommends that you have the following labs drawn at [facility] in 2 weeks:  TSH, Liver Panel.  Your physician recommends that you schedule a follow-up appointment in: 6 weeks with Dr. Ladona Ridgel.  Start Amiodarone 200mg  twice daily.

## 2011-11-23 NOTE — Progress Notes (Signed)
HPI [name] is referred today for evaluation of atrial fibrillation. The patient has a history of congestive heart failure and when he presented initially several months ago had peripheral edema. He is been placed on medical therapy and rate control. Most recent ejection fraction is 50%. The patient underwent cardioversion several weeks ago but went back into atrial fibrillation less than a week after his cardioversion. He is been maintained on anticoagulation and his heart failure medications haven't been advanced. He is currently able to play basketball and do strenuous activity but notes that he gets more tired when he is in atrial fibrillation compared to when he has been rhythm. He does feel palpitations although these are not severe. He has never had syncope. He denies chest pain. Allergies  Allergen Reactions  . Azithromycin Itching    Reaction in ED to administration of azithromycin. Patient stated he started itching.     Current Outpatient Prescriptions  Medication Sig Dispense Refill  . carvedilol (COREG) 25 MG tablet Take 1 tablet (25 mg total) by mouth 2 (two) times daily.  60 tablet  12  . famotidine (PEPCID) 20 MG tablet Take 1 tablet (20 mg total) by mouth daily.  30 tablet  2  . furosemide (LASIX) 40 MG tablet Take 1 tablet (40 mg total) by mouth every other day. Take one tablet every other day.  30 tablet  6  . lisinopril (PRINIVIL,ZESTRIL) 2.5 MG tablet Take 1 tablet (2.5 mg total) by mouth daily.  30 tablet  6  . potassium chloride (K-DUR,KLOR-CON) 10 MEQ tablet Take 1 tablet (10 mEq total) by mouth daily.  30 tablet  6  . warfarin (COUMADIN) 5 MG tablet Take 5-7.5 mg by mouth as directed. *Take one & one-half tablet (7.5mg ) on Sundays, Tuesdays, and Thursdays. Take one tablet (5mg ) on all other days. Takes dose between 7pm and 9pm daily      . DISCONTD: furosemide (LASIX) 40 MG tablet Take 1 tablet (40 mg total) by mouth daily.  30 tablet  6  . amiodarone (PACERONE) 200 MG  tablet Take 1 tablet (200 mg total) by mouth 2 (two) times daily.  60 tablet  3     Past Medical History  Diagnosis Date  . Atrial fibrillation   . Chronic kidney disease, stage 2, mildly decreased GFR   . Alcohol abuse   . TIA (transient ischemic attack)   . Nonischemic cardiomyopathy     LVEF 20-25% improved to 50% on medical therapy    ROS:   All systems reviewed and negative except as noted in the HPI.   Past Surgical History  Procedure Date  . Arthroscopic knee surgery 1987  . Cardioversion 10/08/2011    Procedure: CARDIOVERSION;  Surgeon: Jonelle Sidle, MD;  Location: [facility];  Service: Cardiovascular;  Laterality: N/A;  Done in PACU     Family History  Problem Relation Age of Onset  . Hypertension Mother   . Hypertension Sister   . Hypertension Brother      History   Social History  . Marital Status: Divorced    Spouse Name: N/A    Number of Children: N/A  . Years of Education: N/A   Occupational History  . Not on file.   Social History Main Topics  . Smoking status: Never Smoker   . Smokeless tobacco: Not on file  . Alcohol Use: Yes     History of daily use  . Drug Use: Yes    Special: Marijuana  .  Sexually Active: Yes    Birth Control/ Protection: None   Other Topics Concern  . Not on file   Social History Narrative   [place]ives in [place] with his sisterLaid off from [employer].     BP 120/90  Pulse 54  Ht 5\' 11"  (1.803 m)  Wt 201 lb (91.173 kg)  BMI 28.03 kg/m2  Physical Exam:  Well appearing middle-aged man, NAD HEENT: Unremarkable Neck:  No JVD, no thyromegally Lungs:  Clear with no wheezes, rales, or rhonchi. HEART:  IRegular rate rhythm, no murmurs, no rubs, no clicks Abd:  soft, positive bowel sounds, no organomegally, no rebound, no guarding Ext:  2 plus pulses, no edema, no cyanosis, no clubbing Skin:  No rashes no nodules Neuro:  CN II through XII intact, motor grossly intact  EKG Atrial fibrillation with a  controlled ventricular response.  Assess/Plan:

## 2011-11-23 NOTE — Assessment & Plan Note (Signed)
I discussed the treatment options with the patient. Medical therapy with rate control versus medical therapy with rhythm control were outlined in detail. He does have symptoms from his atrial fibrillation and for this reason I recommended a trial of antiarrhythmic drug therapy. With the structural heart disease, there are not many medications that would be safe for him. His relatively young age would make amiodarone not a good long-term option. However it's unclear to me that he will feel any better in sinus rhythm in A. fib and in the short term I think amiodarone would be his best choice for therapy. We discussed dofetilide but this would require 3 or 4 days of hospitalization as well as an expensive monthly drug prescription cost both of which is not interested in pursuing. With that said, I prescribed amiodarone 400 mg daily. I've asked that 1 week after starting amiodarone, he discontinue his digoxin. We'll have him back in 2 weeks for an ECG. I will see him back in 4-6 weeks. He may not require as much beta blocker and I discussed this with him as well. Our plan would be to proceed with cardioversion approximately 6 weeks after initiation of amiodarone therapy. I would plan to keep him on low-dose amiodarone after this to see if he felt better. If he is better in sinus rhythm, then at alternative therapies like catheter ablation would be warranted. My experience has been however that patients who have maintained sinus rhythm for 3 or 4 months fair better when they undergo catheter ablation than those who have been out of rhythm chronically.

## 2011-11-30 ENCOUNTER — Encounter (HOSPITAL_COMMUNITY): Payer: Self-pay | Admitting: Cardiology

## 2011-12-03 ENCOUNTER — Ambulatory Visit (INDEPENDENT_AMBULATORY_CARE_PROVIDER_SITE_OTHER): Payer: Self-pay | Admitting: *Deleted

## 2011-12-03 ENCOUNTER — Ambulatory Visit (INDEPENDENT_AMBULATORY_CARE_PROVIDER_SITE_OTHER): Payer: Self-pay | Admitting: Adult Health

## 2011-12-03 ENCOUNTER — Encounter: Payer: Self-pay | Admitting: Adult Health

## 2011-12-03 VITALS — BP 110/70 | HR 42 | Ht 71.0 in | Wt 198.8 lb

## 2011-12-03 DIAGNOSIS — I4891 Unspecified atrial fibrillation: Secondary | ICD-10-CM

## 2011-12-03 DIAGNOSIS — Z7901 Long term (current) use of anticoagulants: Secondary | ICD-10-CM

## 2011-12-03 DIAGNOSIS — N182 Chronic kidney disease, stage 2 (mild): Secondary | ICD-10-CM

## 2011-12-03 DIAGNOSIS — G459 Transient cerebral ischemic attack, unspecified: Secondary | ICD-10-CM

## 2011-12-03 LAB — POCT INR: INR: 3.5

## 2011-12-03 MED ORDER — AMIODARONE HCL 200 MG PO TABS: 200.0000 mg | ORAL_TABLET | Freq: Every day | ORAL | Status: DC

## 2011-12-03 MED ORDER — CARVEDILOL 25 MG PO TABS: 12.5000 mg | ORAL_TABLET | Freq: Two times a day (BID) | ORAL | Status: DC

## 2011-12-03 NOTE — Assessment & Plan Note (Signed)
Mr. Casique is feeling very tired and fatigued which I believe is related to his heart rate in medications. I called Dr. Ladona Ridgel and spoke with him by phone to discuss medication adjustments. It is Dr. Lubertha Basque recommendation that we decrease his Coreg to 12.5 mg twice a day for one week, decrease amiodarone to 200 mg daily, and wait for digoxin washout. The patient will come back to the office in one week for an EKG. He artery has labs scheduled for Friday, August 30th. He also has a appointment scheduled with Dr. Ladona Ridgel on September 16th. He will discuss further the timing of his cardioversion based upon that appointment, and Dr. Lubertha Basque further recommendations. His INR was checked today at 3.5, with adjustments in dosing to be scheduled next week as well as his amiodarone dosing is changing.

## 2011-12-03 NOTE — Progress Notes (Signed)
   HPI: [name] is a 51 year old patient of Dr. Dietrich Pates and most recently Dr. Gilman Schmidt. He is following him predominantly now. He has a history of atrial fibrillation atrial flutter, and has had a history of cardioversion in July but went back into atrial fibrillation less than one week after the cardioversion. He was seen by Dr. Ladona Ridgel on 11/23/2011 at which time several options were discussed turning treatment. The patient was ultimately prescribed amiodarone 400 mg daily, one week after starting this he was to stop the digoxin which he had been on previously. He was to continue his Coreg 25 mg twice a day and followup with an EKG and office appointment one week later. It is Dr. Lubertha Basque plan to proceed with cardioversion roughly 6 weeks after initiation of amiodarone therapy. He continues on Coumadin.    On followup today the patient is complaining of profound fatigue, no dizziness, presyncope, or chest discomfort. He states that his energy level is diminished significantly. He took his last dose of digoxin today. He is being seen by the Coumadin clinic RN today as well.  Allergies  Allergen Reactions  . Azithromycin Itching    Reaction in ED to administration of azithromycin. Patient stated he started itching.    Current Outpatient Prescriptions  Medication Sig Dispense Refill  . amiodarone (PACERONE) 200 MG tablet Take 1 tablet (200 mg total) by mouth daily.  60 tablet  3  . carvedilol (COREG) 25 MG tablet Take 0.5 tablets (12.5 mg total) by mouth 2 (two) times daily with a meal.  60 tablet  12  . famotidine (PEPCID) 20 MG tablet Take 1 tablet (20 mg total) by mouth daily.  30 tablet  2  . furosemide (LASIX) 40 MG tablet Take 1 tablet (40 mg total) by mouth every other day. Take one tablet every other day.  30 tablet  6  . lisinopril (PRINIVIL,ZESTRIL) 2.5 MG tablet Take 1 tablet (2.5 mg total) by mouth daily.  30 tablet  6  . potassium chloride (K-DUR,KLOR-CON) 10 MEQ tablet Take 1  tablet (10 mEq total) by mouth daily.  30 tablet  6  . warfarin (COUMADIN) 5 MG tablet Take 5-7.5 mg by mouth as directed. *Take one & one-half tablet (7.5mg ) on Sundays, Tuesdays, and Thursdays. Take one tablet (5mg ) on all other days. Takes dose between 7pm and 9pm daily      . DISCONTD: amiodarone (PACERONE) 200 MG tablet Take 1 tablet (200 mg total) by mouth 2 (two) times daily.  60 tablet  3    Past Medical History  Diagnosis Date  . Atrial fibrillation   . Chronic kidney disease, stage 2, mildly decreased GFR   . Alcohol abuse   . TIA (transient ischemic attack)   . Nonischemic cardiomyopathy     LVEF 20-25% improved to 50% on medical therapy    Past Surgical History  Procedure Date  . Arthroscopic knee surgery 1987  . Cardioversion 10/08/2011    Procedure: CARDIOVERSION;  Surgeon: Jonelle Sidle, MD;  Location: [facility];  Service: Cardiovascular;  Laterality: N/A;  Done in PACU    ROS Review of systems complete and found to be negative unless listed above  PHYSICAL EXAM BP 110/70  Pulse 42  Ht 5\' 11"  (1.803 m)  Wt 198 lb 12 oz (90.152 kg)  BMI 27.72 kg/m2  EKG: Atrial flutter, rate of 42 bpm.  ASSESSMENT AND PLAN

## 2011-12-03 NOTE — Assessment & Plan Note (Signed)
Lab scheduled for 12/07/2011. We will review when available.

## 2011-12-03 NOTE — Patient Instructions (Addendum)
Decrease Amiodarone to 200mg  once daily.  Decrease Coreg to 12.5mg  twice daily.  Your physician recommends that you schedule a follow-up appointment on 12/11/2011 with the nurse for an EKG.  Keep your scheduled appointment with Lewayne Bunting, MD on 12/24/2011.

## 2011-12-07 LAB — HEPATIC FUNCTION PANEL
ALT: 14 U/L (ref 0–53)
AST: 14 U/L (ref 0–37)
Albumin: 4.3 g/dL (ref 3.5–5.2)
Alkaline Phosphatase: 77 U/L (ref 39–117)
Bilirubin, Direct: 0.1 mg/dL (ref 0.0–0.3)
Indirect Bilirubin: 0.4 mg/dL (ref 0.0–0.9)
Total Bilirubin: 0.5 mg/dL (ref 0.3–1.2)
Total Protein: 7.3 g/dL (ref 6.0–8.3)

## 2011-12-07 LAB — TSH: TSH: 4.785 u[IU]/mL — ABNORMAL HIGH (ref 0.350–4.500)

## 2011-12-13 ENCOUNTER — Ambulatory Visit (INDEPENDENT_AMBULATORY_CARE_PROVIDER_SITE_OTHER): Payer: Self-pay | Admitting: *Deleted

## 2011-12-13 ENCOUNTER — Other Ambulatory Visit: Payer: Self-pay | Admitting: *Deleted

## 2011-12-13 VITALS — BP 120/72 | HR 60

## 2011-12-13 DIAGNOSIS — G459 Transient cerebral ischemic attack, unspecified: Secondary | ICD-10-CM

## 2011-12-13 DIAGNOSIS — Z7901 Long term (current) use of anticoagulants: Secondary | ICD-10-CM

## 2011-12-13 DIAGNOSIS — I4891 Unspecified atrial fibrillation: Secondary | ICD-10-CM

## 2011-12-13 LAB — POCT INR: INR: 4.6

## 2011-12-13 MED ORDER — FAMOTIDINE 20 MG PO TABS: 20.0000 mg | ORAL_TABLET | Freq: Every day | ORAL | Status: DC

## 2011-12-13 MED ORDER — POTASSIUM CHLORIDE CRYS ER 10 MEQ PO TBCR: 10.0000 meq | EXTENDED_RELEASE_TABLET | Freq: Every day | ORAL | Status: DC

## 2011-12-13 MED ORDER — LISINOPRIL 2.5 MG PO TABS: 2.5000 mg | ORAL_TABLET | Freq: Every day | ORAL | Status: DC

## 2011-12-13 MED ORDER — WARFARIN SODIUM 5 MG PO TABS: 5.0000 mg | ORAL_TABLET | ORAL | Status: DC

## 2011-12-13 MED ORDER — FUROSEMIDE 40 MG PO TABS: 40.0000 mg | ORAL_TABLET | ORAL | Status: DC

## 2011-12-13 NOTE — Progress Notes (Signed)
Pt presents for follow up EKG per Dr. Ladona Ridgel.  Hx of A-Fib.  Pt currently on Amiodarone 200mg  daily.  Pt denies c/o chest pain, sob , dizziness or palpitations.  Spoke with Tresa Endo, Dr. Lubertha Basque nurse.  I will fax EKG to the [facility]. Dennis Bast, RN.

## 2011-12-24 ENCOUNTER — Encounter: Payer: Self-pay | Admitting: Internal Medicine

## 2011-12-24 ENCOUNTER — Ambulatory Visit (INDEPENDENT_AMBULATORY_CARE_PROVIDER_SITE_OTHER): Payer: Self-pay | Admitting: Internal Medicine

## 2011-12-24 VITALS — BP 116/72 | HR 53 | Ht 71.0 in | Wt 202.0 lb

## 2011-12-24 DIAGNOSIS — I4891 Unspecified atrial fibrillation: Secondary | ICD-10-CM

## 2011-12-24 MED ORDER — CARVEDILOL 6.25 MG PO TABS
6.2500 mg | ORAL_TABLET | Freq: Two times a day (BID) | ORAL | Status: DC
Start: 2011-12-24 — End: 2012-04-22

## 2011-12-24 MED ORDER — AMIODARONE HCL 200 MG PO TABS: 200.0000 mg | ORAL_TABLET | Freq: Two times a day (BID) | ORAL | Status: DC

## 2011-12-24 NOTE — Progress Notes (Signed)
HPI David [name] returns today for followup. He is a very pleasant 51 year old man with atrial fibrillation. He is been cardioverted but did not maintain sinus rhythm. I saw the patient several weeks ago and after an extensive discussion about the pros and cons of rhythm control versus rate control we elected 1 additional try at rhythm control. He was begun on amiodarone. The combination of amiodarone digoxin and beta blockers resulted in severe fatigue and weakness. His digoxin was stopped and his beta blocker dose and his amiodarone dose were reduced. The patient is stable though he still does not feel well. Some days are better than others. He does not have the energy that he had when he was in sinus rhythm. He denies chest pain or shortness of breath. No syncope. Allergies  Allergen Reactions  . Azithromycin Itching    Reaction in ED to administration of azithromycin. Patient stated he started itching.     Current Outpatient Prescriptions  Medication Sig Dispense Refill  . amiodarone (PACERONE) 200 MG tablet Take 1 tablet (200 mg total) by mouth daily.  60 tablet  3  . famotidine (PEPCID) 20 MG tablet Take 1 tablet (20 mg total) by mouth daily.  30 tablet  2  . furosemide (LASIX) 40 MG tablet Take 1 tablet (40 mg total) by mouth every other day. Take one tablet every other day.  30 tablet  6  . lisinopril (PRINIVIL,ZESTRIL) 2.5 MG tablet Take 1 tablet (2.5 mg total) by mouth daily.  30 tablet  6  . potassium chloride (K-DUR,KLOR-CON) 10 MEQ tablet Take 1 tablet (10 mEq total) by mouth daily.  30 tablet  6  . warfarin (COUMADIN) 5 MG tablet Take 1-1.5 tablets (5-7.5 mg total) by mouth as directed. To be taker per coumadin clinic dosing.  45 tablet  1  . amiodarone (PACERONE) 200 MG tablet Take 1 tablet (200 mg total) by mouth 2 (two) times daily.  60 tablet  6  . carvedilol (COREG) 6.25 MG tablet Take 1 tablet (6.25 mg total) by mouth 2 (two) times daily.  60 tablet  6     Past Medical History   Diagnosis Date  . Atrial fibrillation   . Chronic kidney disease, stage 2, mildly decreased GFR   . Alcohol abuse   . TIA (transient ischemic attack)   . Nonischemic cardiomyopathy     LVEF 20-25% improved to 50% on medical therapy    ROS:   All systems reviewed and negative except as noted in the HPI.   Past Surgical History  Procedure Date  . Arthroscopic knee surgery 1987  . Cardioversion 10/08/2011    Procedure: CARDIOVERSION;  Surgeon: Jonelle Sidle, MD;  Location: [facility];  Service: Cardiovascular;  Laterality: N/A;  Done in PACU     Family History  Problem Relation Age of Onset  . Hypertension Mother   . Hypertension Sister   . Hypertension Brother      History   Social History  . Marital Status: Divorced    Spouse Name: N/A    Number of Children: N/A  . Years of Education: N/A   Occupational History  . Not on file.   Social History Main Topics  . Smoking status: Never Smoker   . Smokeless tobacco: Not on file  . Alcohol Use: Yes     History of daily use  . Drug Use: Yes    Special: Marijuana  . Sexually Active: Yes    Birth Control/ Protection: None  Other Topics Concern  . Not on file   Social History Narrative   [place] sister[place]id off from [employer].     BP 116/72  Pulse 53  Ht 5\' 11"  (1.803 m)  Wt 202 lb (91.627 kg)  BMI 28.17 kg/m2  SpO2 98%  Physical Exam:  Well appearing middle-aged man, NAD HEENT: Unremarkable Neck:  No JVD, no thyromegally Lungs:  Clear with no wheezes, rales, or rhonchi. HEART:  IRegular rate rhythm, no murmurs, no rubs, no clicks Abd:  soft, positive bowel sounds, no organomegally, no rebound, no guarding Ext:  2 plus pulses, no edema, no cyanosis, no clubbing Skin:  No rashes no nodules Neuro:  CN II through XII intact, motor grossly intact  EKG atrial fibrillation with a controlled ventricular response.  Assess/Plan:

## 2011-12-24 NOTE — Assessment & Plan Note (Signed)
His symptoms are improved but not optimal. I've asked the patient to reduce his dose of carvedilol to 6.25 mg twice a day and increase his dose of amiodarone to 400 mg a day. I anticipate proceeding with cardioversion in approximately 2 to 2 and half weeks. Following cardioversion, I would expect the patient to require 400 mg a day of amiodarone for approximately 4 weeks followed by reduction in dose after. We'll adjust his beta blocker dose as needed.

## 2011-12-24 NOTE — Patient Instructions (Addendum)
Stop Carvedilol 25 mg.   Start Carvedilol 6.25 mg twice daily.   Start Amiodarone 200 mg twice daily.   Your physician has recommended that you have a Cardioversion (DCCV). Electrical Cardioversion uses a jolt of electricity to your heart either through paddles or wired patches attached to your chest. This is a controlled, usually prescheduled, procedure. Defibrillation is done under light anesthesia in the hospital, and you usually go home the day of the procedure. This is done to get your heart back into a normal rhythm. You are not awake for the procedure. Please see the instruction sheet given to you today.  Will need Protimes every week   Have lab work Thursday 01/03/12 at [facility]  Make sure you have INR the morning of cardioversion 01/10/12 At [facility].

## 2011-12-27 ENCOUNTER — Ambulatory Visit (INDEPENDENT_AMBULATORY_CARE_PROVIDER_SITE_OTHER): Payer: Self-pay | Admitting: *Deleted

## 2011-12-27 DIAGNOSIS — Z7901 Long term (current) use of anticoagulants: Secondary | ICD-10-CM

## 2011-12-27 DIAGNOSIS — G459 Transient cerebral ischemic attack, unspecified: Secondary | ICD-10-CM

## 2011-12-27 DIAGNOSIS — I4891 Unspecified atrial fibrillation: Secondary | ICD-10-CM

## 2011-12-27 LAB — POCT INR: INR: 3

## 2012-01-03 ENCOUNTER — Ambulatory Visit (INDEPENDENT_AMBULATORY_CARE_PROVIDER_SITE_OTHER): Payer: Self-pay | Admitting: *Deleted

## 2012-01-03 ENCOUNTER — Other Ambulatory Visit: Payer: Self-pay | Admitting: Internal Medicine

## 2012-01-03 DIAGNOSIS — G459 Transient cerebral ischemic attack, unspecified: Secondary | ICD-10-CM

## 2012-01-03 DIAGNOSIS — I4891 Unspecified atrial fibrillation: Secondary | ICD-10-CM

## 2012-01-03 DIAGNOSIS — Z7901 Long term (current) use of anticoagulants: Secondary | ICD-10-CM

## 2012-01-03 LAB — CBC WITH DIFFERENTIAL/PLATELET
Basophils Absolute: 0 10*3/uL (ref 0.0–0.1)
Basophils Relative: 0 % (ref 0–1)
Eosinophils Absolute: 0.1 10*3/uL (ref 0.0–0.7)
Eosinophils Relative: 2 % (ref 0–5)
HCT: 40 % (ref 39.0–52.0)
Hemoglobin: 13.7 g/dL (ref 13.0–17.0)
Lymphocytes Relative: 30 % (ref 12–46)
Lymphs Abs: 1.8 10*3/uL (ref 0.7–4.0)
MCH: 31.7 pg (ref 26.0–34.0)
MCHC: 34.3 g/dL (ref 30.0–36.0)
MCV: 92.6 fL (ref 78.0–100.0)
Monocytes Absolute: 0.8 10*3/uL (ref 0.1–1.0)
Monocytes Relative: 13 % — ABNORMAL HIGH (ref 3–12)
Neutro Abs: 3.4 10*3/uL (ref 1.7–7.7)
Neutrophils Relative %: 55 % (ref 43–77)
Platelets: 285 10*3/uL (ref 150–400)
RBC: 4.32 MIL/uL (ref 4.22–5.81)
RDW: 12.8 % (ref 11.5–15.5)
WBC: 6.2 10*3/uL (ref 4.0–10.5)

## 2012-01-03 LAB — POCT INR: INR: 3.5

## 2012-01-03 LAB — APTT: aPTT: 46 seconds — ABNORMAL HIGH (ref 24–37)

## 2012-01-03 LAB — BASIC METABOLIC PANEL
BUN: 23 mg/dL (ref 6–23)
CO2: 27 mEq/L (ref 19–32)
Calcium: 9.5 mg/dL (ref 8.4–10.5)
Chloride: 101 mEq/L (ref 96–112)
Creat: 1.41 mg/dL — ABNORMAL HIGH (ref 0.50–1.35)
Glucose, Bld: 97 mg/dL (ref 70–99)
Potassium: 4.1 mEq/L (ref 3.5–5.3)
Sodium: 137 mEq/L (ref 135–145)

## 2012-01-03 LAB — PROTIME-INR
INR: 2.68 — ABNORMAL HIGH (ref <1.50)
Prothrombin Time: 27.4 seconds — ABNORMAL HIGH (ref 11.6–15.2)

## 2012-01-10 ENCOUNTER — Encounter (HOSPITAL_COMMUNITY)
Admission: RE | Disposition: A | Discharge status: Home / Self Care | Payer: Self-pay | Source: Ambulatory Visit | Attending: Internal Medicine

## 2012-01-10 ENCOUNTER — Ambulatory Visit (HOSPITAL_COMMUNITY)
Admission: RE | Admit: 2012-01-10 | Discharge: 2012-01-10 | Disposition: A | Discharge status: Home / Self Care | Payer: Self-pay | Source: Ambulatory Visit | Attending: Internal Medicine | Admitting: Internal Medicine

## 2012-01-10 ENCOUNTER — Encounter (HOSPITAL_COMMUNITY): Payer: Self-pay | Admitting: Certified Registered Nurse Anesthetist

## 2012-01-10 ENCOUNTER — Encounter (HOSPITAL_COMMUNITY): Payer: Self-pay | Admitting: *Deleted

## 2012-01-10 ENCOUNTER — Ambulatory Visit (INDEPENDENT_AMBULATORY_CARE_PROVIDER_SITE_OTHER): Payer: Self-pay | Admitting: *Deleted

## 2012-01-10 DIAGNOSIS — Z7901 Long term (current) use of anticoagulants: Secondary | ICD-10-CM

## 2012-01-10 DIAGNOSIS — G459 Transient cerebral ischemic attack, unspecified: Secondary | ICD-10-CM

## 2012-01-10 DIAGNOSIS — Z538 Procedure and treatment not carried out for other reasons: Secondary | ICD-10-CM | POA: Insufficient documentation

## 2012-01-10 DIAGNOSIS — Z01818 Encounter for other preprocedural examination: Secondary | ICD-10-CM | POA: Insufficient documentation

## 2012-01-10 DIAGNOSIS — I4891 Unspecified atrial fibrillation: Secondary | ICD-10-CM

## 2012-01-10 HISTORY — PX: CARDIOVERSION: SHX1299

## 2012-01-10 HISTORY — DX: Unspecified osteoarthritis, unspecified site: M19.90

## 2012-01-10 HISTORY — DX: Gastro-esophageal reflux disease without esophagitis: K21.9

## 2012-01-10 LAB — POCT INR: INR: 2

## 2012-01-10 SURGERY — CARDIOVERSION
Anesthesia: Monitor Anesthesia Care | Wound class: Clean

## 2012-01-11 ENCOUNTER — Encounter (HOSPITAL_COMMUNITY): Payer: Self-pay | Admitting: Internal Medicine

## 2012-01-17 ENCOUNTER — Ambulatory Visit (INDEPENDENT_AMBULATORY_CARE_PROVIDER_SITE_OTHER): Payer: Self-pay | Admitting: *Deleted

## 2012-01-17 DIAGNOSIS — I4891 Unspecified atrial fibrillation: Secondary | ICD-10-CM

## 2012-01-17 DIAGNOSIS — G459 Transient cerebral ischemic attack, unspecified: Secondary | ICD-10-CM

## 2012-01-17 DIAGNOSIS — Z7901 Long term (current) use of anticoagulants: Secondary | ICD-10-CM

## 2012-01-17 LAB — POCT INR: INR: 2

## 2012-01-20 NOTE — H&P (Signed)
David [name] presented today to undergo dc cardioversion. His initial ECG demonstrated that he had returned to NSR. His cardioversion was cancelled and he was discharged home with no change in his medications. He will see me in followup in the office.

## 2012-01-21 ENCOUNTER — Encounter: Payer: Self-pay | Admitting: Internal Medicine

## 2012-01-21 ENCOUNTER — Ambulatory Visit (INDEPENDENT_AMBULATORY_CARE_PROVIDER_SITE_OTHER): Payer: Self-pay | Admitting: Internal Medicine

## 2012-01-21 VITALS — BP 150/84 | HR 52 | Ht 71.0 in | Wt 207.0 lb

## 2012-01-21 DIAGNOSIS — I4891 Unspecified atrial fibrillation: Secondary | ICD-10-CM

## 2012-01-21 DIAGNOSIS — I5022 Chronic systolic (congestive) heart failure: Secondary | ICD-10-CM

## 2012-01-21 MED ORDER — LISINOPRIL 5 MG PO TABS: 5.0000 mg | ORAL_TABLET | Freq: Every day | ORAL | Status: DC

## 2012-01-21 MED ORDER — FUROSEMIDE 40 MG PO TABS: 20.0000 mg | ORAL_TABLET | Freq: Every day | ORAL | Status: DC

## 2012-01-21 MED ORDER — AMIODARONE HCL 200 MG PO TABS: 200.0000 mg | ORAL_TABLET | Freq: Two times a day (BID) | ORAL | Status: DC

## 2012-01-21 NOTE — Progress Notes (Signed)
HPI [name] returns today for followup. He is a very pleasant 51 year old man with a history of persistent atrial fibrillation and congestive heart failure. With rate control in medical therapy, his ejection fraction improved and 25% to 50%. His atrial fibrillation was initially rate controlled but he did not feel well. He was started on amiodarone after prior cardioversion failed to maintain sinus rhythm. The patient spontaneously reverted to sinus. Since then he has felt well. He denies chest pain or shortness of breath. No peripheral edema or syncope. Allergies  Allergen Reactions  . Azithromycin Itching    Reaction in ED to administration of azithromycin. Patient stated he started itching.     Current Outpatient Prescriptions  Medication Sig Dispense Refill  . amiodarone (PACERONE) 200 MG tablet Take 1 tablet (200 mg total) by mouth 2 (two) times daily.  60 tablet  6  . carvedilol (COREG) 6.25 MG tablet Take 1 tablet (6.25 mg total) by mouth 2 (two) times daily.  60 tablet  6  . famotidine (PEPCID) 20 MG tablet Take 1 tablet (20 mg total) by mouth daily.  30 tablet  2  . furosemide (LASIX) 40 MG tablet Take 1 tablet (40 mg total) by mouth every other day. Take one tablet every other day.  30 tablet  6  . lisinopril (PRINIVIL,ZESTRIL) 2.5 MG tablet Take 1 tablet (2.5 mg total) by mouth daily.  30 tablet  6  . potassium chloride (K-DUR,KLOR-CON) 10 MEQ tablet Take 1 tablet (10 mEq total) by mouth daily.  30 tablet  6  . warfarin (COUMADIN) 5 MG tablet Take 1-1.5 tablets (5-7.5 mg total) by mouth as directed. To be taker per coumadin clinic dosing.  45 tablet  1  . DISCONTD: amiodarone (PACERONE) 200 MG tablet Take 1 tablet (200 mg total) by mouth daily.  60 tablet  3     Past Medical History  Diagnosis Date  . Atrial fibrillation   . Alcohol abuse   . TIA (transient ischemic attack)   . Nonischemic cardiomyopathy     LVEF 20-25% improved to 50% on medical therapy  . Sleep apnea    wife states pt snores   . Chronic kidney disease, stage 2, mildly decreased GFR   . GERD (gastroesophageal reflux disease)   . Arthritis     bilateral knees     ROS:   All systems reviewed and negative except as noted in the HPI.   Past Surgical History  Procedure Date  . Arthroscopic knee surgery 1987    right  . Cardioversion 10/08/2011    Procedure: CARDIOVERSION;  Surgeon: Jonelle Sidle, MD;  Location: [facility];  Service: Cardiovascular;  Laterality: N/A;  Done in PACU  . Cardioversion 01/10/2012    Procedure: CARDIOVERSION;  Surgeon: Marinus Maw, MD;  Location: [facility] ENDOSCOPY;  Service: Cardiovascular;  Laterality: N/A;     Family History  Problem Relation Age of Onset  . Hypertension Mother   . Hypertension Sister   . Hypertension Brother      History   Social History  . Marital Status: Single    Spouse Name: N/A    Number of Children: N/A  . Years of Education: N/A   Occupational History  . Not on file.   Social History Main Topics  . Smoking status: Former Games developer  . Smokeless tobacco: Not on file  . Alcohol Use: Yes     History of daily use  . Drug Use: Yes    Special: Marijuana  .  Sexually Active: Yes    Birth Control/ Protection: None   Other Topics Concern  . Not on file   Social History Narrative   [place]ives in [place] with his sisterLaid off from [employer].     BP 150/84  Pulse 52  Ht 5\' 11"  (1.803 m)  Wt 207 lb (93.895 kg)  BMI 28.87 kg/m2  Physical Exam:  Well appearing middle-aged man, NAD HEENT: Unremarkable Neck:  No JVD, no thyromegally Lungs:  Clear with no wheezes, rales, or rhonchi. HEART:  Regular rate rhythm, 2/6 aortic insufficiency murmur, no rubs, no clicks Abd:  soft, positive bowel sounds, no organomegally, no rebound, no guarding Ext:  2 plus pulses, no edema, no cyanosis, no clubbing Skin:  No rashes no nodules Neuro:  CN II through XII intact, motor grossly intact  EKG Normal sinus rhythm with sinus  bradycardia  Assess/Plan:

## 2012-01-21 NOTE — Patient Instructions (Addendum)
   Increase Lisinopril to 5mg  daily  Change Lasix to 40mg  (1/2 tab) daily  Continue current dose of Amiodarone x 4 weeks, then decrease to 200mg  daily Follow up in 3 months with Dr. Diona Browner Follow up as needed with Dr. Ladona Ridgel

## 2012-01-21 NOTE — Assessment & Plan Note (Signed)
He is maintaining sinus rhythm. He'll continue amiodarone 400 mg daily for another month, and then decrease his dose to 200 mg daily. Today we briefly discussed the possibility of catheter ablation. Currently he is not particularly interested.

## 2012-01-21 NOTE — Assessment & Plan Note (Signed)
His left ventricular function has improved and he is currently class I. He will continue his current medical therapy, and because his blood pressure is a bit elevated, increase his lisinopril from 2.5 mg daily to 5 mg daily. He is instructed to maintain a low-sodium diet. He will continue his beta blocker. I've asked the patient to take Lasix 20 mg daily. He will followup with Dr. Diona Browner who is his primary cardiologist.

## 2012-01-24 ENCOUNTER — Ambulatory Visit (INDEPENDENT_AMBULATORY_CARE_PROVIDER_SITE_OTHER): Payer: Self-pay | Admitting: *Deleted

## 2012-01-24 DIAGNOSIS — G459 Transient cerebral ischemic attack, unspecified: Secondary | ICD-10-CM

## 2012-01-24 DIAGNOSIS — I4891 Unspecified atrial fibrillation: Secondary | ICD-10-CM

## 2012-01-24 DIAGNOSIS — Z7901 Long term (current) use of anticoagulants: Secondary | ICD-10-CM

## 2012-01-24 LAB — POCT INR
INR: 2.3
INR: 2.3

## 2012-02-14 ENCOUNTER — Ambulatory Visit (INDEPENDENT_AMBULATORY_CARE_PROVIDER_SITE_OTHER): Payer: Self-pay | Admitting: *Deleted

## 2012-02-14 DIAGNOSIS — G459 Transient cerebral ischemic attack, unspecified: Secondary | ICD-10-CM

## 2012-02-14 DIAGNOSIS — I4891 Unspecified atrial fibrillation: Secondary | ICD-10-CM

## 2012-02-14 DIAGNOSIS — Z7901 Long term (current) use of anticoagulants: Secondary | ICD-10-CM

## 2012-02-14 LAB — POCT INR: INR: 2.9

## 2012-03-10 ENCOUNTER — Ambulatory Visit (INDEPENDENT_AMBULATORY_CARE_PROVIDER_SITE_OTHER): Payer: Self-pay | Admitting: *Deleted

## 2012-03-10 DIAGNOSIS — G459 Transient cerebral ischemic attack, unspecified: Secondary | ICD-10-CM

## 2012-03-10 DIAGNOSIS — Z7901 Long term (current) use of anticoagulants: Secondary | ICD-10-CM

## 2012-03-10 DIAGNOSIS — I4891 Unspecified atrial fibrillation: Secondary | ICD-10-CM

## 2012-03-10 LAB — POCT INR: INR: 3.4

## 2012-04-07 ENCOUNTER — Ambulatory Visit (INDEPENDENT_AMBULATORY_CARE_PROVIDER_SITE_OTHER): Payer: Self-pay | Admitting: *Deleted

## 2012-04-07 DIAGNOSIS — I4891 Unspecified atrial fibrillation: Secondary | ICD-10-CM

## 2012-04-07 DIAGNOSIS — Z7901 Long term (current) use of anticoagulants: Secondary | ICD-10-CM

## 2012-04-07 DIAGNOSIS — G459 Transient cerebral ischemic attack, unspecified: Secondary | ICD-10-CM

## 2012-04-07 LAB — POCT INR: INR: 3.5

## 2012-04-22 ENCOUNTER — Encounter: Payer: Self-pay | Admitting: Cardiology

## 2012-04-22 ENCOUNTER — Ambulatory Visit (INDEPENDENT_AMBULATORY_CARE_PROVIDER_SITE_OTHER): Payer: Self-pay | Admitting: Cardiology

## 2012-04-22 VITALS — BP 124/80 | HR 56 | Ht 71.0 in | Wt 220.0 lb

## 2012-04-22 DIAGNOSIS — R0602 Shortness of breath: Secondary | ICD-10-CM

## 2012-04-22 DIAGNOSIS — I429 Cardiomyopathy, unspecified: Secondary | ICD-10-CM

## 2012-04-22 DIAGNOSIS — Z79899 Other long term (current) drug therapy: Secondary | ICD-10-CM

## 2012-04-22 DIAGNOSIS — R001 Bradycardia, unspecified: Secondary | ICD-10-CM

## 2012-04-22 DIAGNOSIS — I4891 Unspecified atrial fibrillation: Secondary | ICD-10-CM

## 2012-04-22 DIAGNOSIS — Z9189 Other specified personal risk factors, not elsewhere classified: Secondary | ICD-10-CM

## 2012-04-22 DIAGNOSIS — I498 Other specified cardiac arrhythmias: Secondary | ICD-10-CM

## 2012-04-22 MED ORDER — CARVEDILOL 3.125 MG PO TABS: 3.1250 mg | ORAL_TABLET | Freq: Two times a day (BID) | ORAL | Status: DC

## 2012-04-22 MED ORDER — AMIODARONE HCL 200 MG PO TABS: 200.0000 mg | ORAL_TABLET | Freq: Two times a day (BID) | ORAL | Status: DC

## 2012-04-22 MED ORDER — AMIODARONE HCL 200 MG PO TABS: 200.0000 mg | ORAL_TABLET | Freq: Every day | ORAL | Status: DC

## 2012-04-22 NOTE — Assessment & Plan Note (Signed)
Most recent LVEF 50% on medical therapy. No change in baseline dyspnea, NYHA class I-II.

## 2012-04-22 NOTE — Progress Notes (Signed)
Clinical Summary David [name] is a 52 y.o.male presenting for followup. I last saw him in June 2013. Most recent visit was with Dr. Ladona Ridgel in October 2013. He has undergone DCCV for management of atrial fibrillation, ultimately placed on amiodarone.  Lab work in September last year showed TSH 4.7, AST 14, ALT 14. ECG today shows sinus bradycardia, normal intervals.  He describes general fatigue at times, no specific palpitations. Reports compliance with his medications.  No PFTs as yet.  He denies significant bleeding problems on Coumadin.   Allergies  Allergen Reactions  . Azithromycin Itching    Reaction in ED to administration of azithromycin. Patient stated he started itching.    Current Outpatient Prescriptions  Medication Sig Dispense Refill  . amiodarone (PACERONE) 200 MG tablet Take 1 tablet (200 mg total) by mouth 2 (two) times daily. X 4weeks, then decrease to 200mg  daily  60 tablet  4  . famotidine (PEPCID) 20 MG tablet Take 1 tablet (20 mg total) by mouth daily.  30 tablet  2  . furosemide (LASIX) 40 MG tablet Take 0.5 tablets (20 mg total) by mouth daily.      Marland Kitchen lisinopril (PRINIVIL,ZESTRIL) 5 MG tablet Take 1 tablet (5 mg total) by mouth daily.  30 tablet  6  . potassium chloride (K-DUR,KLOR-CON) 10 MEQ tablet Take 1 tablet (10 mEq total) by mouth daily.  30 tablet  6  . warfarin (COUMADIN) 5 MG tablet Take 1-1.5 tablets (5-7.5 mg total) by mouth as directed. To be taker per coumadin clinic dosing.  45 tablet  1  . carvedilol (COREG) 3.125 MG tablet Take 1 tablet (3.125 mg total) by mouth 2 (two) times daily.  60 tablet  5    Past Medical History  Diagnosis Date  . Atrial fibrillation   . Alcohol abuse   . TIA (transient ischemic attack)   . Nonischemic cardiomyopathy     LVEF 20-25% improved to 50% on medical therapy  . Sleep apnea     Possible  . Chronic kidney disease, stage 2, mildly decreased GFR   . GERD (gastroesophageal reflux disease)   . Arthritis     Bilateral knees     Social History David [name] reports that he has quit smoking. His smoking use included Cigarettes. He does not have any smokeless tobacco history on file. David [name] reports that he drinks alcohol.  Review of Systems Negative except as outlined above.  Physical Examination Filed Vitals:   04/22/12 0858  BP: 124/80  Pulse: 56   Filed Weights   04/22/12 0858  Weight: 220 lb (99.791 kg)    Normally nourished appearing male in no acute distress.  HEENT: Conjunctiva and lids normal, oropharynx clear.  Neck: Supple, no elevated JVP or carotid bruits, no thyromegaly.  Lungs: Clear to auscultation, nonlabored breathing at rest.  Cardiac:RRR, no S3 or significant systolic murmur, no pericardial rub.  Abdomen: Soft, nontender, bowel sounds present, no guarding or rebound.  Extremities: No pitting edema, distal pulses 2+.  Skin: Warm and dry.  Musculoskeletal: No kyphosis.  Neuropsychiatric: Alert and oriented x3, affect grossly appropriate.   Problem List and Plan   Atrial fibrillation Maintaining sinus rhythm on current regimen, although bradycardic on Amiodarone and Coreg. Due to fatigue will try and reduce dose of Coreg to 3.125 mg BID for now. Needs followup TSH, FLP, and also arrange PFTs. Followup arranged.  Secondary cardiomyopathy, unspecified Most recent LVEF 50% on medical therapy. No change in baseline dyspnea, NYHA class  I-II.    Jonelle Sidle, M.D., F.A.C.C.

## 2012-04-22 NOTE — Assessment & Plan Note (Signed)
Maintaining sinus rhythm on current regimen, although bradycardic on Amiodarone and Coreg. Due to fatigue will try and reduce dose of Coreg to 3.125 mg BID for now. Needs followup TSH, FLP, and also arrange PFTs. Followup arranged.

## 2012-04-22 NOTE — Patient Instructions (Addendum)
Your physician recommends that you schedule a follow-up appointment in: 4 MONTHS WITH SM  Your physician recommends that you return for lab work in: THIS WEEK (SLIPS GIVEN-TSH, LEFT'S, PFT'S)  Your physician has recommended you make the following change in your medication:   1) DECREASE COREG TO 3.125MG  TWICE DAILY (YOU CAN SPLIT CURRENT DOSAGE UNTIL YOU COMPLETE THE BOTTLE_NEW rX SENT TO YOUR PHARMACY)

## 2012-04-22 NOTE — Addendum Note (Signed)
Addended by: Derry Lory A on: 04/22/2012 09:46 AM   Modules accepted: Orders

## 2012-04-23 ENCOUNTER — Ambulatory Visit (HOSPITAL_COMMUNITY)
Admission: RE | Admit: 2012-04-23 | Discharge: 2012-04-23 | Disposition: A | Discharge status: Home / Self Care | Payer: Self-pay | Source: Ambulatory Visit | Attending: Cardiology | Admitting: Cardiology

## 2012-04-23 DIAGNOSIS — R0602 Shortness of breath: Secondary | ICD-10-CM | POA: Insufficient documentation

## 2012-04-23 DIAGNOSIS — Z79899 Other long term (current) drug therapy: Secondary | ICD-10-CM | POA: Insufficient documentation

## 2012-04-23 DIAGNOSIS — I4891 Unspecified atrial fibrillation: Secondary | ICD-10-CM | POA: Insufficient documentation

## 2012-04-23 DIAGNOSIS — Z9189 Other specified personal risk factors, not elsewhere classified: Secondary | ICD-10-CM

## 2012-04-23 LAB — BLOOD GAS, ARTERIAL
Acid-Base Excess: 0.7 mmol/L (ref 0.0–2.0)
Bicarbonate: 24.7 mEq/L — ABNORMAL HIGH (ref 20.0–24.0)
O2 Saturation: 96.5 %
Patient temperature: 37
TCO2: 21.8 mmol/L (ref 0–100)
pCO2 arterial: 38.9 mmHg (ref 35.0–45.0)
pH, Arterial: 7.419 (ref 7.350–7.450)
pO2, Arterial: 88.7 mmHg (ref 80.0–100.0)

## 2012-04-24 NOTE — Procedures (Signed)
NAME:  David [name], David [name]              ACCOUNT NO.:  [id number]  MEDICAL RECORD NO.:  [id number]  LOCATION:  RESP                          FACILITY:  [facility]  PHYSICIAN:  Makenah Karas L. Juanetta Gosling, M.D.DATE OF BIRTH:  [birth year]  DATE OF PROCEDURE: DATE OF DISCHARGE:  04/23/2012                           PULMONARY FUNCTION TEST   REASON FOR PULMONARY FUNCTION TESTING:  Atrial fibrillation.  1. Spirometry is normal. 2. Lung volumes show borderline air trapping, but normal total lung     capacity. 3. DLCO is mildly reduced and corrects somewhat when ventilation is     taken into account. 4. Airway resistance is normal. 5. Arterial blood gas is normal. 6. With the exception of borderline air trapping and the reduction in     diffusing capacity, this is an essentially normal pulmonary     function.     Savio Albrecht L. Juanetta Gosling, M.D.     ELH/MEDQ  D:  04/23/2012  T:  04/24/2012  Job:  [id number]  cc:   Jonelle Sidle, MD

## 2012-04-25 LAB — HEPATIC FUNCTION PANEL
ALT: 20 U/L (ref 0–53)
AST: 19 U/L (ref 0–37)
Albumin: 4.3 g/dL (ref 3.5–5.2)
Alkaline Phosphatase: 79 U/L (ref 39–117)
Bilirubin, Direct: 0.1 mg/dL (ref 0.0–0.3)
Indirect Bilirubin: 0.4 mg/dL (ref 0.0–0.9)
Total Bilirubin: 0.5 mg/dL (ref 0.3–1.2)
Total Protein: 7.2 g/dL (ref 6.0–8.3)

## 2012-04-25 LAB — TSH: TSH: 2.962 u[IU]/mL (ref 0.350–4.500)

## 2012-04-28 ENCOUNTER — Ambulatory Visit (INDEPENDENT_AMBULATORY_CARE_PROVIDER_SITE_OTHER): Payer: Self-pay | Admitting: *Deleted

## 2012-04-28 ENCOUNTER — Encounter: Payer: Self-pay | Admitting: *Deleted

## 2012-04-28 DIAGNOSIS — G459 Transient cerebral ischemic attack, unspecified: Secondary | ICD-10-CM

## 2012-04-28 DIAGNOSIS — I4891 Unspecified atrial fibrillation: Secondary | ICD-10-CM

## 2012-04-28 DIAGNOSIS — Z7901 Long term (current) use of anticoagulants: Secondary | ICD-10-CM

## 2012-04-28 LAB — POCT INR: INR: 3

## 2012-05-07 LAB — PULMONARY FUNCTION TEST

## 2012-05-18 ENCOUNTER — Other Ambulatory Visit: Payer: Self-pay | Admitting: Adult Health

## 2012-06-02 ENCOUNTER — Ambulatory Visit (INDEPENDENT_AMBULATORY_CARE_PROVIDER_SITE_OTHER): Payer: Self-pay | Admitting: *Deleted

## 2012-06-02 DIAGNOSIS — Z7901 Long term (current) use of anticoagulants: Secondary | ICD-10-CM

## 2012-06-02 DIAGNOSIS — I4891 Unspecified atrial fibrillation: Secondary | ICD-10-CM

## 2012-06-02 DIAGNOSIS — G459 Transient cerebral ischemic attack, unspecified: Secondary | ICD-10-CM

## 2012-06-02 LAB — POCT INR: INR: 4.3

## 2012-06-16 ENCOUNTER — Ambulatory Visit (INDEPENDENT_AMBULATORY_CARE_PROVIDER_SITE_OTHER): Payer: Self-pay | Admitting: *Deleted

## 2012-06-16 DIAGNOSIS — Z7901 Long term (current) use of anticoagulants: Secondary | ICD-10-CM

## 2012-06-16 DIAGNOSIS — G459 Transient cerebral ischemic attack, unspecified: Secondary | ICD-10-CM

## 2012-06-16 DIAGNOSIS — I4891 Unspecified atrial fibrillation: Secondary | ICD-10-CM

## 2012-06-16 LAB — POCT INR: INR: 2.4

## 2012-07-07 ENCOUNTER — Ambulatory Visit (INDEPENDENT_AMBULATORY_CARE_PROVIDER_SITE_OTHER): Payer: Self-pay | Admitting: *Deleted

## 2012-07-07 DIAGNOSIS — G459 Transient cerebral ischemic attack, unspecified: Secondary | ICD-10-CM

## 2012-07-07 DIAGNOSIS — Z7901 Long term (current) use of anticoagulants: Secondary | ICD-10-CM

## 2012-07-07 DIAGNOSIS — I4891 Unspecified atrial fibrillation: Secondary | ICD-10-CM

## 2012-07-07 LAB — POCT INR: INR: 2.7

## 2012-07-09 ENCOUNTER — Other Ambulatory Visit: Payer: Self-pay | Admitting: Adult Health

## 2012-08-04 ENCOUNTER — Ambulatory Visit (INDEPENDENT_AMBULATORY_CARE_PROVIDER_SITE_OTHER): Payer: BC Managed Care – PPO | Admitting: *Deleted

## 2012-08-04 DIAGNOSIS — G459 Transient cerebral ischemic attack, unspecified: Secondary | ICD-10-CM

## 2012-08-04 DIAGNOSIS — Z7901 Long term (current) use of anticoagulants: Secondary | ICD-10-CM

## 2012-08-04 DIAGNOSIS — I4891 Unspecified atrial fibrillation: Secondary | ICD-10-CM

## 2012-08-04 LAB — POCT INR: INR: 2.3

## 2012-08-05 ENCOUNTER — Emergency Department (HOSPITAL_COMMUNITY): Payer: BC Managed Care – PPO

## 2012-08-05 ENCOUNTER — Emergency Department (HOSPITAL_COMMUNITY)
Admission: EM | Admit: 2012-08-05 | Discharge: 2012-08-05 | Disposition: A | Discharge status: Home / Self Care | Payer: BC Managed Care – PPO | Attending: Emergency Medicine | Admitting: Emergency Medicine

## 2012-08-05 ENCOUNTER — Encounter (HOSPITAL_COMMUNITY): Payer: Self-pay | Admitting: *Deleted

## 2012-08-05 DIAGNOSIS — Y939 Activity, unspecified: Secondary | ICD-10-CM | POA: Insufficient documentation

## 2012-08-05 DIAGNOSIS — S93609A Unspecified sprain of unspecified foot, initial encounter: Secondary | ICD-10-CM | POA: Insufficient documentation

## 2012-08-05 DIAGNOSIS — K219 Gastro-esophageal reflux disease without esophagitis: Secondary | ICD-10-CM | POA: Insufficient documentation

## 2012-08-05 DIAGNOSIS — Z8673 Personal history of transient ischemic attack (TIA), and cerebral infarction without residual deficits: Secondary | ICD-10-CM | POA: Insufficient documentation

## 2012-08-05 DIAGNOSIS — Z8679 Personal history of other diseases of the circulatory system: Secondary | ICD-10-CM | POA: Insufficient documentation

## 2012-08-05 DIAGNOSIS — Y929 Unspecified place or not applicable: Secondary | ICD-10-CM | POA: Insufficient documentation

## 2012-08-05 DIAGNOSIS — S93601A Unspecified sprain of right foot, initial encounter: Secondary | ICD-10-CM

## 2012-08-05 DIAGNOSIS — Z7901 Long term (current) use of anticoagulants: Secondary | ICD-10-CM | POA: Insufficient documentation

## 2012-08-05 DIAGNOSIS — M7022 Olecranon bursitis, left elbow: Secondary | ICD-10-CM

## 2012-08-05 DIAGNOSIS — Z87891 Personal history of nicotine dependence: Secondary | ICD-10-CM | POA: Insufficient documentation

## 2012-08-05 DIAGNOSIS — X58XXXA Exposure to other specified factors, initial encounter: Secondary | ICD-10-CM | POA: Insufficient documentation

## 2012-08-05 DIAGNOSIS — I4891 Unspecified atrial fibrillation: Secondary | ICD-10-CM | POA: Insufficient documentation

## 2012-08-05 DIAGNOSIS — M7020 Olecranon bursitis, unspecified elbow: Secondary | ICD-10-CM | POA: Insufficient documentation

## 2012-08-05 DIAGNOSIS — N182 Chronic kidney disease, stage 2 (mild): Secondary | ICD-10-CM | POA: Insufficient documentation

## 2012-08-05 DIAGNOSIS — Z8739 Personal history of other diseases of the musculoskeletal system and connective tissue: Secondary | ICD-10-CM | POA: Insufficient documentation

## 2012-08-05 DIAGNOSIS — Z79899 Other long term (current) drug therapy: Secondary | ICD-10-CM | POA: Insufficient documentation

## 2012-08-05 MED ORDER — DEXAMETHASONE SODIUM PHOSPHATE 4 MG/ML IJ SOLN
10.0000 mg | Freq: Once | INTRAMUSCULAR | Status: AC
  Administered 2012-08-05: 10 mg via INTRAMUSCULAR

## 2012-08-05 MED ORDER — KETOROLAC TROMETHAMINE 60 MG/2ML IM SOLN
30.0000 mg | Freq: Once | INTRAMUSCULAR | Status: AC
  Administered 2012-08-05: 17:00:00 via INTRAMUSCULAR
  Filled 2012-08-05: qty 2

## 2012-08-05 MED ORDER — DEXAMETHASONE 6 MG PO TABS: 6.0000 mg | ORAL_TABLET | Freq: Every day | ORAL | Status: DC

## 2012-08-05 MED ORDER — DEXAMETHASONE 10 MG/ML FOR PEDIATRIC ORAL USE
10.0000 mg | Freq: Once | INTRAMUSCULAR | Status: DC
  Filled 2012-08-05: qty 1

## 2012-08-05 MED ORDER — DEXAMETHASONE SODIUM PHOSPHATE 4 MG/ML IJ SOLN
INTRAMUSCULAR | Status: AC
  Administered 2012-08-05: 10 mg via INTRAMUSCULAR
  Filled 2012-08-05: qty 3

## 2012-08-05 MED ORDER — HYDROCODONE-ACETAMINOPHEN 5-325 MG PO TABS: 2.0000 | ORAL_TABLET | ORAL | Status: DC | PRN

## 2012-08-05 MED ORDER — DEXAMETHASONE SODIUM PHOSPHATE 4 MG/ML IJ SOLN: 10.0000 mg | Freq: Once | INTRAMUSCULAR | Status: DC

## 2012-08-05 MED ORDER — OXYCODONE-ACETAMINOPHEN 5-325 MG PO TABS
1.0000 | ORAL_TABLET | Freq: Once | ORAL | Status: AC
  Administered 2012-08-05: 1 via ORAL
  Filled 2012-08-05: qty 1

## 2012-08-05 NOTE — ED Provider Notes (Signed)
Medical screening examination/treatment/procedure(s) were performed by non-physician practitioner and as supervising physician I was immediately available for consultation/collaboration.   Loren Racer, MD 08/05/12 1740

## 2012-08-05 NOTE — ED Provider Notes (Signed)
History     CSN: [id number]  Arrival date & time 08/05/12  1548   First MD Initiated Contact with Patient 08/05/12 1607      Chief Complaint  Patient presents with  . Elbow Pain  . Ankle Pain    (Consider location/radiation/quality/duration/timing/severity/associated sxs/prior treatment) The history is provided by the patient.  David [name] is a 52 y.o. male who presents to the ED with left elbow pain and right foot pain. The pain started yesterday. He has no known injury. The elbow is swollen and the pain is severe. The pain increases with extension of the elbow. Nothing makes the pain better. The foot is tender on the top and the pain increases with walking or pressure. He denies any other problems today.   Past Medical History  Diagnosis Date  . Atrial fibrillation   . Alcohol abuse   . TIA (transient ischemic attack)   . Nonischemic cardiomyopathy     LVEF 20-25% improved to 50% on medical therapy  . Sleep apnea     Possible  . Chronic kidney disease, stage 2, mildly decreased GFR   . GERD (gastroesophageal reflux disease)   . Arthritis     Bilateral knees     Past Surgical History  Procedure Laterality Date  . Arthroscopic knee surgery  1987    right  . Cardioversion  10/08/2011    Procedure: CARDIOVERSION;  Surgeon: Jonelle Sidle, MD;  Location: [facility];  Service: Cardiovascular;  Laterality: N/A;  Done in PACU  . Cardioversion  01/10/2012    Procedure: CARDIOVERSION;  Surgeon: Marinus Maw, MD;  Location: [facility] ENDOSCOPY;  Service: Cardiovascular;  Laterality: N/A;    Family History  Problem Relation Age of Onset  . Hypertension Mother   . Hypertension Sister   . Hypertension Brother     History  Substance Use Topics  . Smoking status: Former Smoker    Types: Cigarettes  . Smokeless tobacco: Not on file  . Alcohol Use: Yes     Comment: History of daily use      Review of Systems  Constitutional: Negative for chills and activity change.  Eyes:  Negative for visual disturbance.  Respiratory: Negative for shortness of breath.   Cardiovascular: Negative for chest pain and palpitations.  Gastrointestinal: Negative for nausea and vomiting.  Musculoskeletal: Positive for joint swelling (left elbow).       Right foot pain.  Skin: Negative for rash.  Neurological: Negative for dizziness and headaches.  Psychiatric/Behavioral: The patient is not nervous/anxious.     Allergies  Azithromycin  Home Medications   Current Outpatient Rx  Name  Route  Sig  Dispense  Refill  . amiodarone (PACERONE) 200 MG tablet   Oral   Take 200 mg by mouth every morning.         . carvedilol (COREG) 3.125 MG tablet   Oral   Take 1 tablet (3.125 mg total) by mouth 2 (two) times daily.   60 tablet   5   . famotidine (PEPCID) 20 MG tablet   Oral   Take 20 mg by mouth every morning.         . furosemide (LASIX) 40 MG tablet   Oral   Take 20 mg by mouth every morning.         Marland Kitchen lisinopril (PRINIVIL,ZESTRIL) 5 MG tablet   Oral   Take 5 mg by mouth every morning.         . potassium chloride (  K-DUR,KLOR-CON) 10 MEQ tablet   Oral   Take 10 mEq by mouth every morning.         . trolamine salicylate (ASPERCREME) 10 % cream   Topical   Apply 1 application topically as needed (for pain).         Marland Kitchen warfarin (COUMADIN) 5 MG tablet   Oral   Take 2.5-5 mg by mouth every evening. Take one tablet (5mg ) daily, except take one-half tablet (2.5mg ) on Mondays and Thursdays         . dexamethasone (DECADRON) 6 MG tablet   Oral   Take 1 tablet (6 mg total) by mouth daily with breakfast.   7 tablet   0   . HYDROcodone-acetaminophen (NORCO/VICODIN) 5-325 MG per tablet   Oral   Take 2 tablets by mouth every 4 (four) hours as needed for pain.   20 tablet   0     BP 155/99  Pulse 72  Temp(Src) 97.1 F (36.2 C) (Oral)  Resp 20  SpO2 100%  Physical Exam  Nursing note and vitals reviewed. Constitutional: He is oriented to person,  place, and time. He appears well-developed and well-nourished.  HENT:  Head: Normocephalic and atraumatic.  Eyes: EOM are normal.  Neck: Normal range of motion. Neck supple.  Cardiovascular: Normal rate.   Pulmonary/Chest: Effort normal.  Abdominal: Soft. There is no tenderness.  Musculoskeletal:       Right elbow: He exhibits decreased range of motion, swelling and effusion. Tenderness found. Olecranon process tenderness noted.       Right foot: He exhibits tenderness. He exhibits normal range of motion and no deformity.       Feet:  Radial pulse present and equal bilateral. Adequate circulation, good touch sensation.   Tenderness right foot dorsal aspect with palpation. Pedal pulse strong, adequate circulation, good touch sensation.   Neurological: He is alert and oriented to person, place, and time. No cranial nerve deficit.  Skin: Skin is warm and dry.  Psychiatric: He has a normal mood and affect. His behavior is normal. Judgment and thought content normal.    ED Course  Procedures (including critical care time)  Assessment: 52 y.o. male with left elbow pain and right foot pain   Bursitis left elbow/ joint effusion   Right foot pain  Plan:  Treat with steroids and pain medicine   Ace wrap for right foot, sling left elbow   Follow up with ortho.   MDM  I have reviewed this patient's vital signs, nurses notes, appropriate imaging and discussed findings with the patient and he voices understanding.    Medication List    TAKE these medications       dexamethasone 6 MG tablet  Commonly known as:  DECADRON  Take 1 tablet (6 mg total) by mouth daily with breakfast.     HYDROcodone-acetaminophen 5-325 MG per tablet  Commonly known as:  NORCO/VICODIN  Take 2 tablets by mouth every 4 (four) hours as needed for pain.      ASK your doctor about these medications       amiodarone 200 MG tablet  Commonly known as:  PACERONE  Take 200 mg by mouth every morning.      carvedilol 3.125 MG tablet  Commonly known as:  COREG  Take 1 tablet (3.125 mg total) by mouth 2 (two) times daily.     famotidine 20 MG tablet  Commonly known as:  PEPCID  Take 20 mg by mouth every morning.  furosemide 40 MG tablet  Commonly known as:  LASIX  Take 20 mg by mouth every morning.     lisinopril 5 MG tablet  Commonly known as:  PRINIVIL,ZESTRIL  Take 5 mg by mouth every morning.     potassium chloride 10 MEQ tablet  Commonly known as:  K-DUR,KLOR-CON  Take 10 mEq by mouth every morning.     trolamine salicylate 10 % cream  Commonly known as:  ASPERCREME  Apply 1 application topically as needed (for pain).     warfarin 5 MG tablet  Commonly known as:  COUMADIN  Take 2.5-5 mg by mouth every evening. Take one tablet (5mg ) daily, except take one-half tablet (2.5mg ) on Mondays and Thursdays               [facility], NP 08/05/12 1734

## 2012-08-05 NOTE — ED Notes (Signed)
Pt c/o left elbow pain that started a few days ago, denies any injury, cms intact distal, right ankle pain that started yesterday, pain became worse this am. Cms intact distal.

## 2012-08-23 ENCOUNTER — Other Ambulatory Visit: Payer: Self-pay | Admitting: Internal Medicine

## 2012-08-23 ENCOUNTER — Other Ambulatory Visit: Payer: Self-pay | Admitting: Adult Health

## 2012-08-25 ENCOUNTER — Telehealth: Payer: Self-pay | Admitting: *Deleted

## 2012-08-25 MED ORDER — WARFARIN SODIUM 5 MG PO TABS: 2.5000 mg | ORAL_TABLET | Freq: Every evening | ORAL | Status: DC

## 2012-08-25 MED ORDER — LISINOPRIL 5 MG PO TABS: 5.0000 mg | ORAL_TABLET | Freq: Every day | ORAL | Status: DC

## 2012-08-25 NOTE — Telephone Encounter (Signed)
rx sent to pharmacy by e-script Left vm to advise both medication have been sent in

## 2012-08-25 NOTE — Telephone Encounter (Signed)
Pt needs lisinopril and coumadin called in today he is completly out

## 2012-08-28 NOTE — Telephone Encounter (Signed)
rx sent to pharmacy by e-script Sent already on 08-25-12

## 2012-09-15 ENCOUNTER — Telehealth: Payer: Self-pay | Admitting: *Deleted

## 2012-09-15 ENCOUNTER — Ambulatory Visit (INDEPENDENT_AMBULATORY_CARE_PROVIDER_SITE_OTHER): Payer: Self-pay | Admitting: *Deleted

## 2012-09-15 ENCOUNTER — Other Ambulatory Visit: Payer: Self-pay | Admitting: Cardiology

## 2012-09-15 DIAGNOSIS — Z7901 Long term (current) use of anticoagulants: Secondary | ICD-10-CM

## 2012-09-15 DIAGNOSIS — I4891 Unspecified atrial fibrillation: Secondary | ICD-10-CM

## 2012-09-15 DIAGNOSIS — G459 Transient cerebral ischemic attack, unspecified: Secondary | ICD-10-CM

## 2012-09-15 LAB — POCT INR: INR: 2.8

## 2012-09-15 NOTE — Telephone Encounter (Signed)
PT NEEDS KLOR-CON AND FAMOTIDINE 20 MG CALLED IN TO [facility] IN [place]

## 2012-09-15 NOTE — Telephone Encounter (Signed)
rx sent to pharmacy by e-script #[id number] NO REFILLS Noted pt needs to call office to schedule f/u per advised on 04-22-12 to f/u in 4 months, placed notation in pt refill to call office to schedule

## 2012-09-16 ENCOUNTER — Other Ambulatory Visit: Payer: Self-pay | Admitting: *Deleted

## 2012-09-16 MED ORDER — FAMOTIDINE 20 MG PO TABS: ORAL_TABLET | ORAL | Status: DC

## 2012-09-16 MED ORDER — POTASSIUM CHLORIDE CRYS ER 10 MEQ PO TBCR: 10.0000 meq | EXTENDED_RELEASE_TABLET | Freq: Every morning | ORAL | Status: DC

## 2012-09-16 NOTE — Telephone Encounter (Signed)
Medications sent via escribe.

## 2012-09-17 ENCOUNTER — Other Ambulatory Visit (HOSPITAL_COMMUNITY): Payer: Self-pay | Admitting: Orthopaedic Surgery

## 2012-09-17 DIAGNOSIS — M25562 Pain in left knee: Secondary | ICD-10-CM

## 2012-09-19 ENCOUNTER — Ambulatory Visit (HOSPITAL_COMMUNITY)
Admission: RE | Admit: 2012-09-19 | Discharge: 2012-09-19 | Disposition: A | Discharge status: Home / Self Care | Payer: BC Managed Care – PPO | Source: Ambulatory Visit | Attending: Orthopaedic Surgery | Admitting: Orthopaedic Surgery

## 2012-09-19 DIAGNOSIS — M25562 Pain in left knee: Secondary | ICD-10-CM

## 2012-09-19 DIAGNOSIS — IMO0002 Reserved for concepts with insufficient information to code with codable children: Secondary | ICD-10-CM | POA: Insufficient documentation

## 2012-09-19 DIAGNOSIS — M25569 Pain in unspecified knee: Secondary | ICD-10-CM | POA: Insufficient documentation

## 2012-09-19 DIAGNOSIS — M25669 Stiffness of unspecified knee, not elsewhere classified: Secondary | ICD-10-CM | POA: Insufficient documentation

## 2012-09-19 DIAGNOSIS — M898X9 Other specified disorders of bone, unspecified site: Secondary | ICD-10-CM | POA: Insufficient documentation

## 2012-09-19 DIAGNOSIS — M2350 Chronic instability of knee, unspecified knee: Secondary | ICD-10-CM | POA: Insufficient documentation

## 2012-09-19 DIAGNOSIS — M1710 Unilateral primary osteoarthritis, unspecified knee: Secondary | ICD-10-CM | POA: Insufficient documentation

## 2012-09-24 ENCOUNTER — Ambulatory Visit (INDEPENDENT_AMBULATORY_CARE_PROVIDER_SITE_OTHER): Payer: BC Managed Care – PPO | Admitting: Family Medicine

## 2012-09-24 ENCOUNTER — Encounter: Payer: Self-pay | Admitting: Family Medicine

## 2012-09-24 VITALS — BP 110/70 | HR 80 | Temp 97.5°F | Resp 18 | Ht 70.0 in | Wt 218.0 lb

## 2012-09-24 DIAGNOSIS — E785 Hyperlipidemia, unspecified: Secondary | ICD-10-CM

## 2012-09-24 DIAGNOSIS — Z23 Encounter for immunization: Secondary | ICD-10-CM

## 2012-09-24 DIAGNOSIS — Z0000 Encounter for general adult medical examination without abnormal findings: Secondary | ICD-10-CM

## 2012-09-24 DIAGNOSIS — N182 Chronic kidney disease, stage 2 (mild): Secondary | ICD-10-CM

## 2012-09-24 DIAGNOSIS — Z125 Encounter for screening for malignant neoplasm of prostate: Secondary | ICD-10-CM

## 2012-09-24 DIAGNOSIS — Z1211 Encounter for screening for malignant neoplasm of colon: Secondary | ICD-10-CM

## 2012-09-24 DIAGNOSIS — I5022 Chronic systolic (congestive) heart failure: Secondary | ICD-10-CM

## 2012-09-24 DIAGNOSIS — I4891 Unspecified atrial fibrillation: Secondary | ICD-10-CM

## 2012-09-24 DIAGNOSIS — R195 Other fecal abnormalities: Secondary | ICD-10-CM

## 2012-09-24 DIAGNOSIS — Z8673 Personal history of transient ischemic attack (TIA), and cerebral infarction without residual deficits: Secondary | ICD-10-CM

## 2012-09-24 DIAGNOSIS — Z79899 Other long term (current) drug therapy: Secondary | ICD-10-CM

## 2012-09-24 LAB — CBC
HCT: 39.9 % (ref 39.0–52.0)
Hemoglobin: 13.5 g/dL (ref 13.0–17.0)
MCH: 31 pg (ref 26.0–34.0)
MCHC: 33.8 g/dL (ref 30.0–36.0)
MCV: 91.7 fL (ref 78.0–100.0)
Platelets: 338 10*3/uL (ref 150–400)
RBC: 4.35 MIL/uL (ref 4.22–5.81)
RDW: 14.3 % (ref 11.5–15.5)
WBC: 7.6 10*3/uL (ref 4.0–10.5)

## 2012-09-24 LAB — TSH: TSH: 2.022 u[IU]/mL (ref 0.350–4.500)

## 2012-09-24 LAB — LIPID PANEL
Cholesterol: 240 mg/dL — ABNORMAL HIGH (ref 0–200)
HDL: 83 mg/dL (ref >39)
LDL Cholesterol: 136 mg/dL — ABNORMAL HIGH (ref 0–99)
Total CHOL/HDL Ratio: 2.9 Ratio
Triglycerides: 106 mg/dL (ref <150)
VLDL: 21 mg/dL (ref 0–40)

## 2012-09-24 LAB — COMPREHENSIVE METABOLIC PANEL
ALT: 23 U/L (ref 0–53)
AST: 28 U/L (ref 0–37)
Albumin: 4.3 g/dL (ref 3.5–5.2)
Alkaline Phosphatase: 99 U/L (ref 39–117)
BUN: 13 mg/dL (ref 6–23)
CO2: 23 mEq/L (ref 19–32)
Calcium: 9.5 mg/dL (ref 8.4–10.5)
Chloride: 99 mEq/L (ref 96–112)
Creat: 1.4 mg/dL — ABNORMAL HIGH (ref 0.50–1.35)
Glucose, Bld: 82 mg/dL (ref 70–99)
Potassium: 4.2 mEq/L (ref 3.5–5.3)
Sodium: 137 mEq/L (ref 135–145)
Total Bilirubin: 0.4 mg/dL (ref 0.3–1.2)
Total Protein: 7.8 g/dL (ref 6.0–8.3)

## 2012-09-24 LAB — PSA: PSA: 2.18 ng/mL (ref <=4.00)

## 2012-09-24 NOTE — Patient Instructions (Signed)
Continue current medications  I recommend eye visit once a year I recommend dental visit every 6 months Goal is to  Exercise 30 minutes 5 days a week We will send a letter with lab results  TDAP given  Referral for colonoscopy F/U 3 months

## 2012-09-25 DIAGNOSIS — Z0000 Encounter for general adult medical examination without abnormal findings: Secondary | ICD-10-CM | POA: Insufficient documentation

## 2012-09-25 DIAGNOSIS — E785 Hyperlipidemia, unspecified: Secondary | ICD-10-CM | POA: Insufficient documentation

## 2012-09-25 DIAGNOSIS — R195 Other fecal abnormalities: Secondary | ICD-10-CM | POA: Insufficient documentation

## 2012-09-25 MED ORDER — SIMVASTATIN 10 MG PO TABS: 10.0000 mg | ORAL_TABLET | Freq: Every day | ORAL | Status: DC

## 2012-09-25 NOTE — Assessment & Plan Note (Signed)
Currently compensated 

## 2012-09-25 NOTE — Assessment & Plan Note (Signed)
CPE done, TDAP given Fasting labs Refer colonoscopy Discussed prostate screening, pt agreed

## 2012-09-25 NOTE — Assessment & Plan Note (Signed)
Start zocor 10mg , history TIA

## 2012-09-25 NOTE — Assessment & Plan Note (Signed)
Check BMET 

## 2012-09-25 NOTE — Progress Notes (Signed)
LMTRC

## 2012-09-25 NOTE — Addendum Note (Signed)
Addended by: Milinda Antis F on: 09/25/2012 10:48 AM   Modules accepted: Orders

## 2012-09-25 NOTE — Progress Notes (Signed)
  Subjective:    Patient ID: David [name], male    DOB: July 16, 1960, 52 y.o.   MRN: [id number]  HPI  Pt here to establish care- Due for CPE, no Previous PCP, Cardiology- Labuer, Ortho- Dr. Hilda Lias Medications and history reviewed Last cardiology note reviewed Pt has history of A fib diagnosed 1 year ago, now on coumadin therapy and anti-arrythmics and cardioversion failed. 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 History of remote incarceration due to possesion with intent to sell, no current illict drug use, history of alcoholism, now drinks 24 ounces a day, previous 3-4 40 onces a day Knee pain- followed by ortho, recent MRI shows meniscus tear, arthritis, effusion, torn ACL has f/u next week, old injuries, has had arthroscopy on right knee in 1980's No prostate exam, no colonoscopy   Review of Systems  GEN- denies fatigue, fever, weight loss,weakness, recent illness HEENT- denies eye drainage, change in vision, nasal discharge, CVS- denies chest pain, palpitations RESP- denies SOB, cough, wheeze ABD- denies N/V, change in stools, abd pain GU- denies dysuria, hematuria, dribbling, incontinence MSK-+ joint pain, muscle aches, injury Neuro- denies headache, dizziness, syncope, seizure activity      Objective:   Physical Exam GEN- NAD, alert and oriented x3 HEENT- PERRL, EOMI, non injected sclera, pink conjunctiva, MMM, oropharynx clear Neck- Supple,  CVS- regular rhythm, normal rate, no murmur RESP-CTAB ABD-NABS,soft,NT,ND GU- normal external rectum small skin tag, FOBT positive, prostate smooth no nodules EXT- No edema Pulses- Radial, DP- 2+ Psych- normal affect and mood       Assessment & Plan:

## 2012-09-25 NOTE — Assessment & Plan Note (Signed)
On coumadin, he has seen [facility] on occasion since being on coumadin, refer to GI for scope

## 2012-09-25 NOTE — Assessment & Plan Note (Signed)
asymptomatic , followed by cards

## 2012-10-20 ENCOUNTER — Other Ambulatory Visit: Payer: Self-pay | Admitting: *Deleted

## 2012-10-20 MED ORDER — FAMOTIDINE 20 MG PO TABS: ORAL_TABLET | ORAL | Status: DC

## 2012-10-23 ENCOUNTER — Telehealth: Payer: Self-pay | Admitting: Gastroenterology

## 2012-10-23 ENCOUNTER — Ambulatory Visit: Payer: BC Managed Care – PPO | Admitting: Gastroenterology

## 2012-10-23 NOTE — Telephone Encounter (Signed)
Please send letter for f/u.  

## 2012-10-23 NOTE — Telephone Encounter (Signed)
Patient was a no-show 

## 2012-10-27 ENCOUNTER — Telehealth: Payer: Self-pay | Admitting: *Deleted

## 2012-10-27 MED ORDER — AMIODARONE HCL 200 MG PO TABS: 200.0000 mg | ORAL_TABLET | Freq: Every morning | ORAL | Status: DC

## 2012-10-27 NOTE — Telephone Encounter (Signed)
rx sent to pharmacy by e-script  

## 2012-10-27 NOTE — Telephone Encounter (Signed)
Pt needs amiodarone called in to [facility] in [place]

## 2012-10-28 ENCOUNTER — Encounter: Payer: Self-pay | Admitting: Orthopedic Surgery

## 2012-10-28 ENCOUNTER — Ambulatory Visit (INDEPENDENT_AMBULATORY_CARE_PROVIDER_SITE_OTHER): Payer: BC Managed Care – PPO

## 2012-10-28 ENCOUNTER — Encounter: Payer: Self-pay | Admitting: General Practice

## 2012-10-28 ENCOUNTER — Ambulatory Visit (INDEPENDENT_AMBULATORY_CARE_PROVIDER_SITE_OTHER): Payer: BC Managed Care – PPO | Admitting: Orthopedic Surgery

## 2012-10-28 VITALS — BP 160/90 | Ht 71.0 in | Wt 226.0 lb

## 2012-10-28 DIAGNOSIS — M25569 Pain in unspecified knee: Secondary | ICD-10-CM

## 2012-10-28 DIAGNOSIS — M25562 Pain in left knee: Secondary | ICD-10-CM

## 2012-10-28 DIAGNOSIS — M1710 Unilateral primary osteoarthritis, unspecified knee: Secondary | ICD-10-CM

## 2012-10-28 DIAGNOSIS — IMO0002 Reserved for concepts with insufficient information to code with codable children: Secondary | ICD-10-CM

## 2012-10-28 MED ORDER — HYDROCODONE-ACETAMINOPHEN 7.5-325 MG PO TABS: 1.0000 | ORAL_TABLET | ORAL | Status: DC | PRN

## 2012-10-28 NOTE — Telephone Encounter (Signed)
LETTER MAILED

## 2012-10-28 NOTE — Patient Instructions (Addendum)
Preparing for Knee Replacement  Recovery from knee replacement surgery can be made easier and more comfortable by taking steps to be prepared before surgery. This includes:  · Arranging for others to help you.  · Preparing your home.  · Making sure your body is prepared by doing a pre-operative exam and being as healthy as you can.  · Doing exercises before your surgery as told by your caregiver.  Also, you can ease any concerns about your financial responsibilities by calling your insurance company after you decide to have surgery. In addition to your surgery and hospital stay, you will want to ask about your coverage for medical equipment, rehab facilities, and home care.  ARRANGING FOR HELP   You will be getting stronger and more mobile every day. However, in the first couple weeks after surgery, it is unlikely you will be able to do all your daily activities as easily as before your surgery. You will tire easily and still have limited movement in your leg. Follow these guidelines to best arrange for the help you may need after your surgery:  · Arrange for someone to drive you home from the hospital. Your surgeon will be able to tell you how many days you can expect to be in the hospital.  · Cancel all work, care-giving, and volunteer responsibilities for at least 4 to 6 weeks after your surgery.  · If you live alone, arrange for someone to care for your home and pets for the first 4 to 6 weeks.  · Select someone with whom you feel comfortable to be with you day and night for the first week. This person will help you with your exercises and personal care, like bathing and using the toilet.  · Arrange for drivers to bring you to and from your doctor and therapy appointments, as well as to the grocery store and other places you need to go, for at least 4 to 6 weeks.  · Select 2 or 3 rehab facilities where you would be comfortable recovering just in case you are not able to go directly home to recover.  PREPARING  YOUR HOME  · Remove all clutter from your floors.  · To see if you will be able to move in these spaces with a wheeled walker, hold your hands out about 6 inches from your sides. Then walk from your bed to the bathroom. Walk from your resting spot to your kitchen and bathroom. If you do not hit anything with your hands, you probably have enough room.  · Remove throw rugs.  · Move the items you use often to shelves and drawers that are at countertop height.  Move items in your bathroom, kitchen, and bedroom.  · Prepare a few meals that you can freeze and reheat later.  · Do not plan on recovering in bed.  It is better for your health to sit upright. You may wish to use a recliner with a small table nearby. Keep the items you use most frequently on that table. These may include the TV remote, a cordless phone, a book or laptop computer, water glass, and any other items of your choice.  · Consider adding grab bars in the shower and near the toilet.  · While you are in the hospital, you will learn about equipment helpful for your recovery. Some of the equipment includes raised toilet seats, tub benches, and shower benches. Often, your hospital care team will help you decide what you need and can direct you about where to buy these items. You may not need all   of these items, and they are not often returnable, so it is not recommended that you buy them before going to the hospital.  PREPARING YOUR BODY  · Complete a pre-operative exam. This will ensure that your body is healthy enough to safely complete this surgery. Be certain to bring a complete list of all your medicines and supplements (herbs and vitamins). You may be advised to have additional tests to ensure your safety.  · Complete elective dental care and routine cleanings before the surgery. Germs from anywhere in your body, including those in your mouth, can travel to your new joint and infect it.  It is important not to have any dental work performed for at  least 3 months after your surgery. After surgery, be sure to tell your dentist about your joint replacement.  · Maintain a healthy diet. Unless advised by your surgeon, do not drastically change your diet before your surgery.  · Quit smoking. Get help from your caregiver if you need it.  · The day before your surgery, follow your surgeon's directions for showering, eating and drinking. These directions are for your safety.  EXERCISES  Your caregiver may have you do the following exercises before your surgery. Be sure to follow the exercise program your caregiver prescribes for you. Doing the exercises on both sides will help prepare your "good" side as well. While completing these exercises, remember:   · Stretch as long as you can, up to 30 seconds, without pain developing.  · You should only feel a gentle lengthening or release in the stretched tissue.  Ankle Pumps  1. While sitting with your knees straight, draw the top of your feet upwards by flexing your ankles.   2. Then, reverse the motion, pointing your toes downward.  3. Repeat 10 to 20 times. Complete this exercise 1 to 2 times per day.  Heel Slides  1. Lie on your back with both knees straight. (If this causes back discomfort, bend your opposite knee, placing your foot flat on the floor.)  2. Slowly slide your heel back toward your buttocks until you feel a gentle stretch in the front of your knee or thigh.  3. Slowly slide your heel back to the starting position.  4. Repeat 10 to 20 times. Complete this exercise 1 to 2 times per day.  Quadriceps Sets  1. Lie on your back with your sore leg extended and your opposite knee bent.  2. Gradually tense the muscles in the front of your thigh. You should see either your kneecap slide up toward your hip or increased dimpling just above the knee. This motion will push the back of the knee down toward the floor.   3. Hold the muscle as tight as you can without increasing your pain for 10 seconds.  4. Relax the  muscles slowly and completely in between each repetition.  5. Repeat 10 to 20 times. Complete this exercise 1 to 2 times per day.  Short Arc Kicks  1. Lie on your back. Place a 4 to 6 inch towel roll under your sore knee so that the knee slightly bends.  2. Raise only your lower leg by tightening the muscles in the front of your thigh. Do not allow your thigh to rise.  3. Hold this position for 5 seconds.  4. Repeat 10 to 20 times. Complete this exercise 1 to 2 times per day.  Straight Leg Raises  1. Lie on your back with your sore leg extended and your opposite knee bent.   2. Tense the muscles in the front of your sore thigh. You should see either   repetition. 6. Repeat 10 to 20 times.Complete this exercise 1 to 2 times per day. Arm Chair Push-ups 1. Find a firm, non-wheeled chair with solid armrests. 2. Sitting in the chair, extend your sore leg straight out in front of you. 3. Lift up your body weight, using your arms and opposite leg. 4. Slowly lower your body weight. 5. Repeat 10 to 20 times.Complete this exercise 1 to 2 times per day. Document Released: 06/30/2010 Document Revised: 06/18/2011 Document Reviewed: 06/30/2010 [facility] Patient Information 2014 [place], [place]. Total Knee Replacement Total knee replacement is a procedure to replace your knee joint with an artificial knee joint (prosthetic knee joint). The purpose of this surgery is to reduce pain and improve your knee function. LET YOUR CAREGIVER KNOW ABOUT:   Any allergies you have.  Any medicines you are taking, including vitamins, herbs, eyedrops, over-the-counter medicines, and creams.  Any problems you have had with  the use of anesthetics.  Family history of problems with the use of anesthetics.  Any blood disorders you have, including bleeding problems or clotting problems.  Previous surgeries you have had. RISKS AND COMPLICATIONS  Generally, total knee replacement is a safe procedure. However, as with any surgical procedure, complications can occur. Possible complications associated with total knee replacement include:  Loss of range of motion of the knee or instability.  Loosening of the prosthesis.  Infection.  Persistent pain. BEFORE THE PROCEDURE   Your caregiver will instruct you when you need to stop eating and drinking.  Ask your caregiver if you need to change or stop any regular medicines. PROCEDURE  Just before the procedure you will receive medicine that will make you drowsy (sedative). This will be given through a tube that is inserted into one of your veins (intravenous [IV] tube). Then you will either receive medicine to block pain from the waist down through your legs (spinal block) or medicine to also receive medicine to make you fall asleep (general anesthetic). You may also receive medicine to block feeling in your leg (nerve block) to help ease pain after surgery. An incision will be made in your knee. Your surgeon will take out any damaged cartilage and bone by sawing off the damaged surfaces. Then the surgeon will put a new metal liner over the sawed off portion of your thigh bone (femur) and a plastic liner over the sawed off portion of one of the bones of your lower leg (tibia). This is to restore alignment and function to your knee. A plastic piece is often used to restore the surface of your knee cap. AFTER THE PROCEDURE  You will be taken to the recovery area. You may have drainage tubes to drain excess fluid from your knee. These tubes attach to a device that removes these fluids. Once you are awake, stable, and taking fluids well, you will be taken to your hospital room.  You will receive physical therapy as prescribed by your caregiver. The length of your stay in the hospital after a knee replacement is 2 4 days. Your surgeon may recommend that you spend time (usually an additional 10 14 days) in an extended-care facility to help you begin walking again and improve your range of motion before you go home. You may also be prescribed blood-thinning medicine to decrease your risk of developing blood clots in your leg. Document Released: 07/02/2000 Document Revised: 09/25/2011 Document Reviewed: 05/06/2011 [facility] Patient Information 2014 [place], [place].

## 2012-10-28 NOTE — Progress Notes (Signed)
  Subjective:    Patient ID: David [name], male    DOB: [birth year], 52 y.o.   MRN: [id number]  HPI Comments: Patient presents with:   Knee Pain - Left knee pain. Referred by Dr. Hilda Lias  The patient is 52 years old injured his knee in 1989 playing basketball he declined open reconstruction at that time was able to return to normal activities including basketball 4-5 times a week until recently his pain is increased his swelling is become more frequent his activity level is decreased due to pain and swelling. He did wear brace for activities never taken any medications for the knee and never had any therapy. He complains of sharp throbbing 10 out of 10 pain which is intermittent depending on his activity. He has frequent locking episodes and frequent episodes of swelling.  He is currently not working so his activity level has not warranting any significant stress on his leg. However the pain has worsened.  Knee Pain       Review of Systems  Constitutional: Positive for fatigue and unexpected weight change.  Respiratory: Positive for cough and shortness of breath.   Cardiovascular: Positive for chest pain and palpitations.  Gastrointestinal: Positive for blood in stool.  Hematological: Bruises/bleeds easily.  All other systems reviewed and are negative.       Objective:   Physical Exam  Nursing note and vitals reviewed. Constitutional: He is oriented to person, place, and time. He appears well-developed and well-nourished. No distress.  HENT:  Head: Normocephalic.  Neck: Neck supple. No JVD present. No tracheal deviation present. No thyromegaly present.  Pulmonary/Chest: No stridor.  Musculoskeletal:       Right shoulder: Normal. He exhibits no tenderness, no swelling, no deformity, no spasm and normal strength.       Left shoulder: Normal. He exhibits no tenderness, no bony tenderness, no crepitus, no deformity, no spasm and normal strength.  Right knee and left knee are  in varus. Right knee flexion 110 left knee flexion 103 measured by goniometer. No extension deficits.  Left knee is unstable and the anterior posterior plane the positive Lachman and positive anterior drawer and mild laxity medial lateral as well. Joint line tenderness medial crepitance on range of motion. Strength normal.  Right knee mild crepitation no instability no significant tenderness restriction in motion of varus alignment is noted muscle tone is normal strength is normal  Lymphadenopathy:       Right cervical: No superficial cervical adenopathy present.      Left cervical: No superficial cervical adenopathy present.       Right: No inguinal adenopathy present.       Left: No inguinal adenopathy present.  Neurological: He is alert and oriented to person, place, and time. He has normal reflexes. He displays normal reflexes. He exhibits normal muscle tone. Coordination abnormal.  Skin: No rash noted. He is not diaphoretic. No pallor.  Psychiatric: He has a normal mood and affect. His behavior is normal. Thought content normal.          Assessment & Plan:  The x-ray shows a very arthritic knee with varus alignment  The patient has atrial fibrillation is on Coumadin. He will see his cardiologist. We can make plans for surgery provided he stops drinking and gets cardiac clearance. He is placed in a brace started on pain medication I will see him in one month we can discuss surgical treatment with knee replacement.

## 2012-10-29 ENCOUNTER — Ambulatory Visit: Payer: Self-pay | Admitting: Cardiology

## 2012-11-26 ENCOUNTER — Ambulatory Visit (INDEPENDENT_AMBULATORY_CARE_PROVIDER_SITE_OTHER): Payer: BC Managed Care – PPO | Admitting: *Deleted

## 2012-11-26 ENCOUNTER — Ambulatory Visit (INDEPENDENT_AMBULATORY_CARE_PROVIDER_SITE_OTHER): Payer: BC Managed Care – PPO | Admitting: Cardiology

## 2012-11-26 ENCOUNTER — Encounter: Payer: Self-pay | Admitting: Cardiology

## 2012-11-26 VITALS — BP 158/98 | HR 64 | Ht 71.0 in | Wt 225.2 lb

## 2012-11-26 DIAGNOSIS — I429 Cardiomyopathy, unspecified: Secondary | ICD-10-CM

## 2012-11-26 DIAGNOSIS — Z7901 Long term (current) use of anticoagulants: Secondary | ICD-10-CM

## 2012-11-26 DIAGNOSIS — I4891 Unspecified atrial fibrillation: Secondary | ICD-10-CM

## 2012-11-26 DIAGNOSIS — G459 Transient cerebral ischemic attack, unspecified: Secondary | ICD-10-CM

## 2012-11-26 LAB — POCT INR: INR: 1.9

## 2012-11-26 NOTE — Patient Instructions (Addendum)
Your physician recommends that you schedule a follow-up appointment in: 4 months with DR SM  PLEASE CALL OUR OFFICE TO ADVISE WHEN YOUR SURGERY WILL BE SCHEDULED TO ADJUST YOUR COUMADIN

## 2012-11-26 NOTE — Assessment & Plan Note (Signed)
Symptomatically stable, LVEF improved to 50% on medical therapy by most recent assessment.

## 2012-11-26 NOTE — Assessment & Plan Note (Signed)
Maintaining sinus rhythm. Continues on Coumadin, amiodarone, low-dose Coreg. With upcoming total knee replacement, we will coordinate appropriate time to stop Coumadin prior to surgery once a date is set. Otherwise he can resume thereafter. Followup arranged.

## 2012-11-26 NOTE — Progress Notes (Signed)
Clinical Summary Mr. Bargar is a 52 y.o.male last seen in January. He is here with his wife. Reports no major palpitations, no progressive shortness of breath or chest pain. We reviewed his medications. He has been taking everything except Zocor.  PFTs from earlier in the year demonstrated mildly reduced DLCO, borderline air trapping. Labwork in June showed normal LFTs, creatinine 1.4, potassium 4.2, TSH 2.0.  ECG today shows sinus rhythm with leftward axis and decreased R wave progression.  He tells me that he will be undergoing total left knee replacement with Dr. Romeo Apple soon. He should be able to temporarily hold Coumadin for this without any bridging. I discussed this with Misty Stanley in the Coumadin clinic.   Allergies  Allergen Reactions  . Azithromycin Itching    Reaction in ED to administration of azithromycin. Patient stated he started itching.    Current Outpatient Prescriptions  Medication Sig Dispense Refill  . allopurinol (ZYLOPRIM) 300 MG tablet Take 300 mg by mouth daily.       Marland Kitchen amiodarone (PACERONE) 200 MG tablet Take 1 tablet (200 mg total) by mouth every morning.  90 tablet  0  . carvedilol (COREG) 3.125 MG tablet Take 1 tablet (3.125 mg total) by mouth 2 (two) times daily.  60 tablet  5  . famotidine (PEPCID) 20 MG tablet TAKE ONE TABLET BY MOUTH EVERY DAY  30 tablet  2  . furosemide (LASIX) 40 MG tablet Take 20 mg by mouth every morning.      Marland Kitchen HYDROcodone-acetaminophen (NORCO) 7.5-325 MG per tablet Take 1 tablet by mouth every 4 (four) hours as needed for pain.  60 tablet  5  . lisinopril (PRINIVIL,ZESTRIL) 5 MG tablet Take 1 tablet (5 mg total) by mouth daily.  30 tablet  3  . potassium chloride (K-DUR,KLOR-CON) 10 MEQ tablet Take 1 tablet (10 mEq total) by mouth every morning.  30 tablet  2  . warfarin (COUMADIN) 5 MG tablet Take 0.5-1 tablets (2.5-5 mg total) by mouth every evening. Take one tablet (5mg ) daily, except take one-half tablet (2.5mg ) on Mondays and  Thursdays  45 tablet  3   No current facility-administered medications for this visit.    Past Medical History  Diagnosis Date  . Atrial fibrillation   . Alcohol abuse   . TIA (transient ischemic attack)   . Nonischemic cardiomyopathy     LVEF 20-25% improved to 50% on medical therapy  . Sleep apnea     Possible  . Chronic kidney disease, stage 2, mildly decreased GFR   . GERD (gastroesophageal reflux disease)   . Arthritis     Bilateral knees     Social History Mr. Korn reports that he has quit smoking. His smoking use included Cigarettes. He smoked 0.00 packs per day. He does not have any smokeless tobacco history on file. Mr. Kuechle reports that  drinks alcohol.  Review of Systems Left knee pain. Otherwise negative.  Physical Examination Filed Vitals:   11/26/12 1058  BP: 158/98  Pulse: 64   Filed Weights   11/26/12 1058  Weight: 225 lb 4 oz (102.173 kg)    Normally nourished appearing male in no acute distress.  HEENT: Conjunctiva and lids normal, oropharynx clear.  Neck: Supple, no elevated JVP or carotid bruits, no thyromegaly.  Lungs: Clear to auscultation, nonlabored breathing at rest.  Cardiac:RRR, no S3 or significant systolic murmur, no pericardial rub.  Abdomen: Soft, nontender, bowel sounds present, no guarding or rebound.  Extremities: No pitting edema,  distal pulses 2+.  Skin: Warm and dry.  Musculoskeletal: No kyphosis.  Neuropsychiatric: Alert and oriented x3, affect grossly appropriate.   Problem List and Plan   Atrial fibrillation Maintaining sinus rhythm. Continues on Coumadin, amiodarone, low-dose Coreg. With upcoming total knee replacement, we will coordinate appropriate time to stop Coumadin prior to surgery once a date is set. Otherwise he can resume thereafter. Followup arranged.  Secondary cardiomyopathy, unspecified Symptomatically stable, LVEF improved to 50% on medical therapy by most recent assessment.    Jonelle Sidle,  M.D., F.A.C.C.

## 2012-12-02 ENCOUNTER — Ambulatory Visit (INDEPENDENT_AMBULATORY_CARE_PROVIDER_SITE_OTHER): Payer: BC Managed Care – PPO | Admitting: Orthopedic Surgery

## 2012-12-02 VITALS — BP 170/98 | Ht 71.0 in | Wt 224.0 lb

## 2012-12-02 DIAGNOSIS — L237 Allergic contact dermatitis due to plants, except food: Secondary | ICD-10-CM

## 2012-12-02 DIAGNOSIS — L255 Unspecified contact dermatitis due to plants, except food: Secondary | ICD-10-CM

## 2012-12-02 MED ORDER — PREDNISONE (PAK) 5 MG PO TABS: ORAL_TABLET | ORAL | Status: DC

## 2012-12-02 NOTE — Progress Notes (Signed)
Patient ID: David [name], male   DOB: 07-10-1960, 52 y.o.   MRN: [id number]  Chief Complaint  Patient presents with  . Follow-up    Follow up left knee osteoarthritis, discuss Left Total knee replacement    The patient has undergone appropriate preoperative consultation with cardiology and has been cleared for surgery. He will need to stop Coumadin. However now he has a rash. He will be placed on prednisone for the rash  History as recorded exam as recorded  Patient to be scheduled for total knee replacement after rash clears up I'll see him in 2 weeks after 2 weeks of prednisone  BP 170/98  Ht 5\' 11"  (1.803 m)  Wt 224 lb (101.606 kg)  BMI 31.26 kg/m2 General appearance is normal, the patient is alert and oriented x3 with normal mood and affect. Both legs back arms have several areas of pinpoint redness which looks like sugar bites to me but may be some type of poison oak or Ivy  Rash Osteoarthritis left knee  Prednisone back followup 2 weeks to schedule surgery

## 2012-12-04 ENCOUNTER — Ambulatory Visit: Payer: Self-pay | Admitting: Cardiology

## 2012-12-16 ENCOUNTER — Ambulatory Visit (INDEPENDENT_AMBULATORY_CARE_PROVIDER_SITE_OTHER): Payer: BC Managed Care – PPO | Admitting: Orthopedic Surgery

## 2012-12-16 VITALS — BP 133/92 | Ht 71.0 in | Wt 224.0 lb

## 2012-12-16 DIAGNOSIS — M1710 Unilateral primary osteoarthritis, unspecified knee: Secondary | ICD-10-CM

## 2012-12-16 DIAGNOSIS — M179 Osteoarthritis of knee, unspecified: Secondary | ICD-10-CM

## 2012-12-16 NOTE — Progress Notes (Signed)
Patient ID: David [name], male   DOB: 1960/11/12, 52 y.o.   MRN: [id number] Reevaluation for skin rash prior to total knee replacement. The patient's and rash has cleared he is okay for left total knee replacement we will followup with him after surgery surgery scheduled for 26 September

## 2012-12-16 NOTE — Patient Instructions (Addendum)
Surgery 01/02/13

## 2012-12-17 ENCOUNTER — Telehealth: Payer: Self-pay | Admitting: *Deleted

## 2012-12-17 ENCOUNTER — Other Ambulatory Visit: Payer: Self-pay | Admitting: Cardiology

## 2012-12-17 ENCOUNTER — Other Ambulatory Visit: Payer: Self-pay | Admitting: *Deleted

## 2012-12-17 MED ORDER — LISINOPRIL 5 MG PO TABS: 5.0000 mg | ORAL_TABLET | Freq: Every day | ORAL | Status: DC

## 2012-12-17 MED ORDER — BUPIVACAINE LIPOSOME 1.3 % IJ SUSP: 20.0000 mL | Freq: Once | INTRAMUSCULAR | Status: DC

## 2012-12-17 NOTE — Telephone Encounter (Signed)
Pt scheduled to have a TKR on 01/02/13 by Dr Romeo Apple.  Will need to hold coumadin prior to procedure.  Please advise.

## 2012-12-17 NOTE — Telephone Encounter (Signed)
Should be able to hold Coumadin temporarily without bridging for TKR.

## 2012-12-17 NOTE — Telephone Encounter (Signed)
Will give pt instructions on holding coumadin at INR appt 12/24/12.  Pt is aware.

## 2012-12-23 ENCOUNTER — Other Ambulatory Visit: Payer: Self-pay | Admitting: *Deleted

## 2012-12-23 DIAGNOSIS — M1710 Unilateral primary osteoarthritis, unspecified knee: Secondary | ICD-10-CM

## 2012-12-23 MED ORDER — HYDROCODONE-ACETAMINOPHEN 7.5-325 MG PO TABS: 1.0000 | ORAL_TABLET | ORAL | Status: DC | PRN

## 2012-12-26 ENCOUNTER — Other Ambulatory Visit: Payer: Self-pay

## 2012-12-26 ENCOUNTER — Ambulatory Visit: Payer: BC Managed Care – PPO | Admitting: Family Medicine

## 2012-12-26 MED ORDER — FUROSEMIDE 40 MG PO TABS: 20.0000 mg | ORAL_TABLET | Freq: Every morning | ORAL | Status: DC

## 2013-01-01 ENCOUNTER — Encounter (HOSPITAL_COMMUNITY): Payer: Self-pay | Admitting: Pharmacy Technician

## 2013-01-02 ENCOUNTER — Ambulatory Visit: Payer: BC Managed Care – PPO | Admitting: Family Medicine

## 2013-01-05 ENCOUNTER — Ambulatory Visit (INDEPENDENT_AMBULATORY_CARE_PROVIDER_SITE_OTHER): Payer: BC Managed Care – PPO | Admitting: *Deleted

## 2013-01-05 DIAGNOSIS — I4891 Unspecified atrial fibrillation: Secondary | ICD-10-CM

## 2013-01-05 DIAGNOSIS — Z7901 Long term (current) use of anticoagulants: Secondary | ICD-10-CM

## 2013-01-05 DIAGNOSIS — G459 Transient cerebral ischemic attack, unspecified: Secondary | ICD-10-CM

## 2013-01-05 LAB — POCT INR: INR: 2

## 2013-01-07 ENCOUNTER — Telehealth: Payer: Self-pay | Admitting: Orthopedic Surgery

## 2013-01-07 NOTE — Telephone Encounter (Signed)
Regarding surgery scheduled at [facility], 01/16/13, CPT [phone], in-patient/admit, ICD-9 codes 715.96, 715.16, contacted insurer [employer] at ph# [phone].  Per care management operations representative, Johny Chess, received approval for up to 7-day stay - Pre-Authorization # [id number].

## 2013-01-09 NOTE — Patient Instructions (Signed)
David [name]  01/09/2013   Your procedure is scheduled on:  01/16/13  Report to [name] at [facility].  Call this number if you have problems the morning of surgery: [phone]   Remember:   Do not eat food or drink liquids after midnight.   Take these medicines the morning of surgery with A SIP OF WATER: allopurinol, coreg, amiodarone, pepcid, lisinopril   Do not wear jewelry, make-up or nail polish.  Do not wear lotions, powders, or perfumes. You may wear deodorant.  Do not shave 48 hours prior to surgery. Men may shave face and neck.  Do not bring valuables to the hospital.  [facility] is not responsible                  for any belongings or valuables.               Contacts, dentures or bridgework may not be worn into surgery.  Leave suitcase in the car. After surgery it may be brought to your room.  For patients admitted to the hospital, discharge time is determined by your                treatment team.               Patients discharged the day of surgery will not be allowed to drive  home.  Name and phone number of your driver: family  Special Instructions: Shower using CHG 2 nights before surgery and the night before surgery.  If you shower the day of surgery use CHG.  Use special wash - you have one bottle of CHG for all showers.  You should use approximately 1/3 of the bottle for each shower.   Please read over the following fact sheets that you were given: Pain Booklet, Surgical Site Infection Prevention, Anesthesia Post-op Instructions and Care and Recovery After Surgery   PATIENT INSTRUCTIONS POST-ANESTHESIA  IMMEDIATELY FOLLOWING SURGERY:  Do not drive or operate machinery for the first twenty four hours after surgery.  Do not make any important decisions for twenty four hours after surgery or while taking narcotic pain medications or sedatives.  If you develop intractable nausea and vomiting or a severe headache please notify your doctor  immediately.  FOLLOW-UP:  Please make an appointment with your surgeon as instructed. You do not need to follow up with anesthesia unless specifically instructed to do so.  WOUND CARE INSTRUCTIONS (if applicable):  Keep a dry clean dressing on the anesthesia/puncture wound site if there is drainage.  Once the wound has quit draining you may leave it open to air.  Generally you should leave the bandage intact for twenty four hours unless there is drainage.  If the epidural site drains for more than 36-48 hours please call the anesthesia department.  QUESTIONS?:  Please feel free to call your physician or the hospital operator if you have any questions, and they will be happy to assist you.      Total Knee Replacement  Total knee replacement is a surgery to replace your damaged knee joint. Your knee joint is replaced with a man-made (artificial) knee joint. The man-made knee joint is called a prosthesis. This surgery is done to reduce pain. It is also done to allow you to be more active. BEFORE THE PROCEDURE   Your doctor will tell you when to stop eating.  Your doctor will tell you when to stop drinking.  Ask your doctor if you need  to change or stop any regular medicines. PROCEDURE  You may also be given medicine that makes you relax (sedative) or makes you sleep (general anesthetic). You also will be given medicine that numbs you from the waist down (spinal block). During your procedure:   A surgical cut (incision) is made in the front of your knee.  Damaged parts of your knee joint are removed.  A new metal liner is put over the part of the thigh bone that is removed.  A plastic liner is put over the shin bone.  A plastic piece often is put over the surface of your knee cap. AFTER THE PROCEDURE   You will be taken to the recovery area.  Once you are doing okay, you will be taken to your hospital room.  You will do different types of exercises (physical therapy) at the  hospital.  Your leg may be placed in a machine that gently moves it.  You will likely stay in the hospital for 2 4 days.  Your doctor may have you take medicine to make your blood thinner. This helps to prevent blood clots. Document Released: 06/18/2011 Document Reviewed: 06/18/2011 [facility] Patient Information 2014 [place], [place]. Total Knee Replacement Total knee replacement is a procedure to replace your knee joint with an artificial knee joint (prosthetic knee joint). The purpose of this surgery is to reduce pain and improve your knee function. LET YOUR CAREGIVER KNOW ABOUT:   Any allergies you have.  Any medicines you are taking, including vitamins, herbs, eyedrops, over-the-counter medicines, and creams.  Any problems you have had with the use of anesthetics.  Family history of problems with the use of anesthetics.  Any blood disorders you have, including bleeding problems or clotting problems.  Previous surgeries you have had. RISKS AND COMPLICATIONS  Generally, total knee replacement is a safe procedure. However, as with any surgical procedure, complications can occur. Possible complications associated with total knee replacement include:  Loss of range of motion of the knee or instability.  Loosening of the prosthesis.  Infection.  Persistent pain. BEFORE THE PROCEDURE   Your caregiver will instruct you when you need to stop eating and drinking.  Ask your caregiver if you need to change or stop any regular medicines. PROCEDURE  Just before the procedure you will receive medicine that will make you drowsy (sedative). This will be given through a tube that is inserted into one of your veins (intravenous [IV] tube). Then you will either receive medicine to block pain from the waist down through your legs (spinal block) or medicine to also receive medicine to make you fall asleep (general anesthetic). You may also receive medicine to block feeling in your leg (nerve  block) to help ease pain after surgery. An incision will be made in your knee. Your surgeon will take out any damaged cartilage and bone by sawing off the damaged surfaces. Then the surgeon will put a new metal liner over the sawed off portion of your thigh bone (femur) and a plastic liner over the sawed off portion of one of the bones of your lower leg (tibia). This is to restore alignment and function to your knee. A plastic piece is often used to restore the surface of your knee cap. AFTER THE PROCEDURE  You will be taken to the recovery area. You may have drainage tubes to drain excess fluid from your knee. These tubes attach to a device that removes these fluids. Once you are awake, stable, and taking fluids  well, you will be taken to your hospital room. You will receive physical therapy as prescribed by your caregiver. The length of your stay in the hospital after a knee replacement is 2 4 days. Your surgeon may recommend that you spend time (usually an additional 10 14 days) in an extended-care facility to help you begin walking again and improve your range of motion before you go home. You may also be prescribed blood-thinning medicine to decrease your risk of developing blood clots in your leg. Document Released: 07/02/2000 Document Revised: 09/25/2011 Document Reviewed: 05/06/2011 [facility] Patient Information 2014 [place], [place].

## 2013-01-12 ENCOUNTER — Encounter (HOSPITAL_COMMUNITY)
Admission: RE | Admit: 2013-01-12 | Discharge: 2013-01-12 | Disposition: A | Discharge status: Home / Self Care | Payer: BC Managed Care – PPO | Source: Ambulatory Visit | Attending: Orthopedic Surgery | Admitting: Orthopedic Surgery

## 2013-01-12 ENCOUNTER — Encounter (HOSPITAL_COMMUNITY): Payer: Self-pay

## 2013-01-12 DIAGNOSIS — Z01818 Encounter for other preprocedural examination: Secondary | ICD-10-CM | POA: Insufficient documentation

## 2013-01-12 DIAGNOSIS — Z01812 Encounter for preprocedural laboratory examination: Secondary | ICD-10-CM | POA: Insufficient documentation

## 2013-01-12 HISTORY — DX: Gout, unspecified: M10.9

## 2013-01-12 LAB — CBC WITH DIFFERENTIAL/PLATELET
Basophils Absolute: 0 10*3/uL (ref 0.0–0.1)
Basophils Relative: 0 % (ref 0–1)
Eosinophils Absolute: 0.1 10*3/uL (ref 0.0–0.7)
Eosinophils Relative: 1 % (ref 0–5)
HCT: 39.3 % (ref 39.0–52.0)
Hemoglobin: 13.2 g/dL (ref 13.0–17.0)
Lymphocytes Relative: 22 % (ref 12–46)
Lymphs Abs: 1.6 10*3/uL (ref 0.7–4.0)
MCH: 31.5 pg (ref 26.0–34.0)
MCHC: 33.6 g/dL (ref 30.0–36.0)
MCV: 93.8 fL (ref 78.0–100.0)
Monocytes Absolute: 0.7 10*3/uL (ref 0.1–1.0)
Monocytes Relative: 11 % (ref 3–12)
Neutro Abs: 4.6 10*3/uL (ref 1.7–7.7)
Neutrophils Relative %: 66 % (ref 43–77)
Platelets: 364 10*3/uL (ref 150–400)
RBC: 4.19 MIL/uL — ABNORMAL LOW (ref 4.22–5.81)
RDW: 13.4 % (ref 11.5–15.5)
WBC: 7 10*3/uL (ref 4.0–10.5)

## 2013-01-12 LAB — SURGICAL PCR SCREEN
MRSA, PCR: NEGATIVE
Staphylococcus aureus: NEGATIVE

## 2013-01-12 LAB — PREPARE RBC (CROSSMATCH)

## 2013-01-12 LAB — BASIC METABOLIC PANEL
BUN: 15 mg/dL (ref 6–23)
CO2: 28 mEq/L (ref 19–32)
Calcium: 9.8 mg/dL (ref 8.4–10.5)
Chloride: 104 mEq/L (ref 96–112)
Creatinine, Ser: 1.7 mg/dL — ABNORMAL HIGH (ref 0.50–1.35)
GFR calc Af Amer: 52 mL/min — ABNORMAL LOW (ref >90)
GFR calc non Af Amer: 45 mL/min — ABNORMAL LOW (ref >90)
Glucose, Bld: 97 mg/dL (ref 70–99)
Potassium: 4.4 mEq/L (ref 3.5–5.1)
Sodium: 142 mEq/L (ref 135–145)

## 2013-01-13 ENCOUNTER — Other Ambulatory Visit: Payer: Self-pay | Admitting: Cardiology

## 2013-01-15 LAB — ABO/RH: ABO/RH(D): O POS

## 2013-01-15 MED ORDER — BUPIVACAINE LIPOSOME 1.3 % IJ SUSP: 20.0000 mL | Freq: Once | INTRAMUSCULAR | Status: DC

## 2013-01-15 NOTE — OR Nursing (Signed)
Last dose of coumadin was  Saturday  01/10/2013

## 2013-01-15 NOTE — H&P (Signed)
Subjective:     Patient ID: David [name], male    DOB: 16-Nov-1960, 52 y.o.   MRN: [id number]  HPI Comments: Patient presents with:   Knee Pain - Left knee pain. Referred by Dr. Hilda Lias  The patient is 52 years old injured his knee in 1989 playing basketball he declined open reconstruction at that time was able to return to normal activities including basketball 4-5 times a week until recently his pain is increased his swelling is become more frequent his activity level is decreased due to pain and swelling. He did wear brace for activities never taken any medications for the knee and never had any therapy. He complains of sharp throbbing 10 out of 10 pain which is intermittent depending on his activity. He has frequent locking episodes and frequent episodes of swelling.  He is currently not working so his activity level has not warranting any significant stress on his leg. However the pain has worsened.  Past Medical History  Diagnosis Date  . Atrial fibrillation   . Alcohol abuse   . TIA (transient ischemic attack)   . Nonischemic cardiomyopathy     LVEF 20-25% improved to 50% on medical therapy  . Chronic kidney disease, stage 2, mildly decreased GFR   . GERD (gastroesophageal reflux disease)   . Arthritis     Bilateral knees   . Stroke     states ministroke about 06/2011. No deficits  . Gout     Past Surgical History  Procedure Laterality Date  . Arthroscopic knee surgery  1987    right  . Cardioversion  10/08/2011    Procedure: CARDIOVERSION;  Surgeon: Jonelle Sidle, MD;  Location: [facility]S;  Service: Cardiovascular;  Laterality: N/A;  Done in PACU  . Cardioversion  01/10/2012    Procedure: CARDIOVERSION;  Surgeon: Marinus Maw, MD;  Location: [facility] ENDOSCOPY;  Service: Cardiovascular;  Laterality: N/A;    Family History  Problem Relation Age of Onset  . Hypertension Mother   . Hypertension Sister   . Hypertension Brother     History  Substance Use Topics  .  Smoking status: Former Smoker    Types: Cigarettes    Quit date: 01/12/2010  . Smokeless tobacco: Not on file  . Alcohol Use: 0.6 oz/week    1 Cans of beer per week     Comment: last beer 01/12/2013 but sts i am quitting     Knee Pain       Review of Systems  Constitutional: Positive for fatigue and unexpected weight change.  Respiratory: Positive for cough and shortness of breath.   Cardiovascular: Positive for chest pain and palpitations.  Gastrointestinal: Positive for blood in stool.  Hematological: Bruises/bleeds easily.  All other systems reviewed and are negative.       Objective:    Physical Exam  Nursing note and vitals reviewed. Constitutional: He is oriented to person, place, and time. He appears well-developed and well-nourished. No distress.  HENT:   Head: Normocephalic.  Neck: Neck supple. No JVD present. No tracheal deviation present. No thyromegaly present.  Pulmonary/Chest: No stridor.  Musculoskeletal:       Right shoulder: Normal. He exhibits no tenderness, no swelling, no deformity, no spasm and normal strength.       Left shoulder: Normal. He exhibits no tenderness, no bony tenderness, no crepitus, no deformity, no spasm and normal strength.  Right knee and left knee are in varus. Right knee flexion 110 left knee flexion 103  measured by goniometer. No extension deficits.  Left knee is unstable and the anterior posterior plane the positive Lachman and positive anterior drawer and mild laxity medial lateral as well. Joint line tenderness medial crepitance on range of motion. Strength normal.  Right knee mild crepitation no instability no significant tenderness restriction in motion of varus alignment is noted muscle tone is normal strength is normal  Lymphadenopathy:      Right cervical: No superficial cervical adenopathy present.      Left cervical: No superficial cervical adenopathy present.       Right: No inguinal adenopathy present.       Left: No  inguinal adenopathy present.  Neurological: He is alert and oriented to person, place, and time. He has normal reflexes. He displays normal reflexes. He exhibits normal muscle tone. Coordination abnormal.  Skin: No rash noted. He is not diaphoretic. No pallor.  Psychiatric: He has a normal mood and affect. His behavior is normal. Thought content normal.           Assessment & Plan:   The x-ray shows a very arthritic knee with varus alignment  The patient has undergone cardiac clearance.  We plan to do a left total knee

## 2013-01-16 ENCOUNTER — Inpatient Hospital Stay (HOSPITAL_COMMUNITY): Payer: BC Managed Care – PPO | Admitting: Anesthesiology

## 2013-01-16 ENCOUNTER — Inpatient Hospital Stay (HOSPITAL_COMMUNITY): Payer: BC Managed Care – PPO

## 2013-01-16 ENCOUNTER — Inpatient Hospital Stay (HOSPITAL_COMMUNITY)
Admission: RE | Admit: 2013-01-16 | Discharge: 2013-01-19 | DRG: 209 | Disposition: A | Discharge status: Home Health | Payer: BC Managed Care – PPO | Source: Ambulatory Visit | Attending: Orthopedic Surgery | Admitting: Orthopedic Surgery

## 2013-01-16 ENCOUNTER — Encounter (HOSPITAL_COMMUNITY): Payer: Self-pay | Admitting: *Deleted

## 2013-01-16 ENCOUNTER — Encounter (HOSPITAL_COMMUNITY): Payer: BC Managed Care – PPO | Admitting: Anesthesiology

## 2013-01-16 ENCOUNTER — Encounter (HOSPITAL_COMMUNITY)
Admission: RE | Disposition: A | Discharge status: Home Health | Payer: Self-pay | Source: Ambulatory Visit | Attending: Orthopedic Surgery

## 2013-01-16 DIAGNOSIS — Z8673 Personal history of transient ischemic attack (TIA), and cerebral infarction without residual deficits: Secondary | ICD-10-CM

## 2013-01-16 DIAGNOSIS — M1710 Unilateral primary osteoarthritis, unspecified knee: Principal | ICD-10-CM | POA: Diagnosis present

## 2013-01-16 DIAGNOSIS — I4891 Unspecified atrial fibrillation: Secondary | ICD-10-CM | POA: Diagnosis present

## 2013-01-16 DIAGNOSIS — I428 Other cardiomyopathies: Secondary | ICD-10-CM | POA: Diagnosis present

## 2013-01-16 DIAGNOSIS — K219 Gastro-esophageal reflux disease without esophagitis: Secondary | ICD-10-CM | POA: Diagnosis present

## 2013-01-16 DIAGNOSIS — Z8249 Family history of ischemic heart disease and other diseases of the circulatory system: Secondary | ICD-10-CM

## 2013-01-16 DIAGNOSIS — N182 Chronic kidney disease, stage 2 (mild): Secondary | ICD-10-CM | POA: Diagnosis present

## 2013-01-16 DIAGNOSIS — Z87891 Personal history of nicotine dependence: Secondary | ICD-10-CM

## 2013-01-16 HISTORY — PX: TOTAL KNEE ARTHROPLASTY: SHX125

## 2013-01-16 LAB — PROTIME-INR
INR: 1.15 (ref 0.00–1.49)
Prothrombin Time: 14.5 seconds (ref 11.6–15.2)

## 2013-01-16 SURGERY — ARTHROPLASTY, KNEE, TOTAL
Anesthesia: Spinal | Site: Knee | Laterality: Left | Wound class: Clean

## 2013-01-16 MED ORDER — DOCUSATE SODIUM 100 MG PO CAPS
100.0000 mg | ORAL_CAPSULE | Freq: Two times a day (BID) | ORAL | Status: DC
  Administered 2013-01-16 – 2013-01-19 (×7): 100 mg via ORAL
  Filled 2013-01-16 (×7): qty 1

## 2013-01-16 MED ORDER — PHENOL 1.4 % MT LIQD: 1.0000 | OROMUCOSAL | Status: DC | PRN

## 2013-01-16 MED ORDER — AMIODARONE HCL 200 MG PO TABS
200.0000 mg | ORAL_TABLET | Freq: Every morning | ORAL | Status: DC
  Administered 2013-01-17 – 2013-01-19 (×3): 200 mg via ORAL
  Filled 2013-01-16 (×3): qty 1

## 2013-01-16 MED ORDER — MAGNESIUM CITRATE PO SOLN: 1.0000 | Freq: Once | ORAL | Status: AC | PRN

## 2013-01-16 MED ORDER — BUPIVACAINE-EPINEPHRINE PF 0.5-1:200000 % IJ SOLN
INTRAMUSCULAR | Status: AC
  Filled 2013-01-16: qty 10

## 2013-01-16 MED ORDER — ACETAMINOPHEN 500 MG PO TABS
1000.0000 mg | ORAL_TABLET | Freq: Once | ORAL | Status: AC
  Administered 2013-01-16: 1000 mg via ORAL
  Filled 2013-01-16: qty 2

## 2013-01-16 MED ORDER — WARFARIN - PHYSICIAN DOSING INPATIENT: Status: DC

## 2013-01-16 MED ORDER — MIDAZOLAM HCL 2 MG/2ML IJ SOLN
INTRAMUSCULAR | Status: AC
  Filled 2013-01-16: qty 2

## 2013-01-16 MED ORDER — FENTANYL CITRATE 0.05 MG/ML IJ SOLN
INTRAMUSCULAR | Status: DC | PRN
  Administered 2013-01-16: 25 ug via INTRAVENOUS
  Administered 2013-01-16: 25 ug via INTRATHECAL
  Administered 2013-01-16: 25 ug via INTRAVENOUS

## 2013-01-16 MED ORDER — SODIUM CHLORIDE 0.9 % IJ SOLN
INTRAMUSCULAR | Status: AC
  Filled 2013-01-16: qty 40

## 2013-01-16 MED ORDER — FUROSEMIDE 20 MG PO TABS
20.0000 mg | ORAL_TABLET | Freq: Every morning | ORAL | Status: DC
  Administered 2013-01-16 – 2013-01-19 (×4): 20 mg via ORAL
  Filled 2013-01-16 (×4): qty 1

## 2013-01-16 MED ORDER — ONDANSETRON HCL 4 MG/2ML IJ SOLN
4.0000 mg | Freq: Four times a day (QID) | INTRAMUSCULAR | Status: DC | PRN
  Administered 2013-01-17: 4 mg via INTRAVENOUS
  Filled 2013-01-16: qty 2

## 2013-01-16 MED ORDER — METHOCARBAMOL 500 MG PO TABS
500.0000 mg | ORAL_TABLET | Freq: Four times a day (QID) | ORAL | Status: DC | PRN
  Administered 2013-01-17: 500 mg via ORAL
  Filled 2013-01-16: qty 1

## 2013-01-16 MED ORDER — OXYCODONE HCL 5 MG PO TABS
ORAL_TABLET | ORAL | Status: AC
  Filled 2013-01-16: qty 1

## 2013-01-16 MED ORDER — EPHEDRINE SULFATE 50 MG/ML IJ SOLN
INTRAMUSCULAR | Status: AC
  Filled 2013-01-16: qty 1

## 2013-01-16 MED ORDER — OXYCODONE HCL 5 MG PO TABS
5.0000 mg | ORAL_TABLET | Freq: Once | ORAL | Status: DC
  Administered 2013-01-16: 5 mg via ORAL

## 2013-01-16 MED ORDER — BISACODYL 10 MG RE SUPP: 10.0000 mg | Freq: Every day | RECTAL | Status: DC | PRN

## 2013-01-16 MED ORDER — ONDANSETRON HCL 4 MG PO TABS: 4.0000 mg | ORAL_TABLET | Freq: Four times a day (QID) | ORAL | Status: DC | PRN

## 2013-01-16 MED ORDER — CEFAZOLIN SODIUM-DEXTROSE 2-3 GM-% IV SOLR
2.0000 g | INTRAVENOUS | Status: AC
  Administered 2013-01-16: 2 g via INTRAVENOUS
  Filled 2013-01-16: qty 50

## 2013-01-16 MED ORDER — METOCLOPRAMIDE HCL 10 MG PO TABS
5.0000 mg | ORAL_TABLET | Freq: Three times a day (TID) | ORAL | Status: DC | PRN
  Filled 2013-01-16: qty 2

## 2013-01-16 MED ORDER — CARVEDILOL 3.125 MG PO TABS
3.1250 mg | ORAL_TABLET | Freq: Two times a day (BID) | ORAL | Status: DC
  Administered 2013-01-16 – 2013-01-19 (×6): 3.125 mg via ORAL
  Filled 2013-01-16 (×6): qty 1

## 2013-01-16 MED ORDER — PROPOFOL 10 MG/ML IV BOLUS
INTRAVENOUS | Status: AC
  Filled 2013-01-16: qty 20

## 2013-01-16 MED ORDER — METOCLOPRAMIDE HCL 5 MG/ML IJ SOLN: 5.0000 mg | Freq: Three times a day (TID) | INTRAMUSCULAR | Status: DC | PRN

## 2013-01-16 MED ORDER — LACTATED RINGERS IV SOLN
INTRAVENOUS | Status: DC
  Administered 2013-01-16: 1000 mL via INTRAVENOUS
  Administered 2013-01-16: 09:00:00 via INTRAVENOUS

## 2013-01-16 MED ORDER — METHOCARBAMOL 100 MG/ML IJ SOLN
500.0000 mg | Freq: Four times a day (QID) | INTRAVENOUS | Status: DC | PRN
  Filled 2013-01-16: qty 5

## 2013-01-16 MED ORDER — POLYETHYLENE GLYCOL 3350 17 G PO PACK
17.0000 g | PACK | Freq: Every day | ORAL | Status: DC
  Administered 2013-01-17 – 2013-01-19 (×3): 17 g via ORAL
  Filled 2013-01-16 (×4): qty 1

## 2013-01-16 MED ORDER — SENNA 8.6 MG PO TABS
1.0000 | ORAL_TABLET | Freq: Two times a day (BID) | ORAL | Status: DC
  Administered 2013-01-16 – 2013-01-19 (×7): 8.6 mg via ORAL
  Filled 2013-01-16 (×7): qty 1

## 2013-01-16 MED ORDER — FENTANYL CITRATE 0.05 MG/ML IJ SOLN
25.0000 ug | INTRAMUSCULAR | Status: AC
  Administered 2013-01-16 (×2): 25 ug via INTRAVENOUS

## 2013-01-16 MED ORDER — FENTANYL CITRATE 0.05 MG/ML IJ SOLN
INTRAMUSCULAR | Status: AC
  Filled 2013-01-16: qty 2

## 2013-01-16 MED ORDER — BUPIVACAINE IN DEXTROSE 0.75-8.25 % IT SOLN
INTRATHECAL | Status: DC | PRN
  Administered 2013-01-16: 15 mg via INTRATHECAL

## 2013-01-16 MED ORDER — POTASSIUM CHLORIDE IN NACL 20-0.9 MEQ/L-% IV SOLN
INTRAVENOUS | Status: DC
  Administered 2013-01-16 – 2013-01-19 (×4): via INTRAVENOUS

## 2013-01-16 MED ORDER — BUPIVACAINE LIPOSOME 1.3 % IJ SUSP
20.0000 mL | Freq: Once | INTRAMUSCULAR | Status: AC
  Administered 2013-01-16: 60 mL
  Filled 2013-01-16: qty 20

## 2013-01-16 MED ORDER — SODIUM CHLORIDE 0.9 % IR SOLN
Status: DC | PRN
  Administered 2013-01-16: 3000 mL

## 2013-01-16 MED ORDER — SODIUM CHLORIDE BACTERIOSTATIC 0.9 % IJ SOLN
INTRAMUSCULAR | Status: AC
  Filled 2013-01-16: qty 10

## 2013-01-16 MED ORDER — ALLOPURINOL 300 MG PO TABS
300.0000 mg | ORAL_TABLET | Freq: Every day | ORAL | Status: DC
  Administered 2013-01-17 – 2013-01-19 (×3): 300 mg via ORAL
  Filled 2013-01-16 (×3): qty 1

## 2013-01-16 MED ORDER — LIDOCAINE HCL (PF) 1 % IJ SOLN
INTRAMUSCULAR | Status: AC
  Filled 2013-01-16: qty 5

## 2013-01-16 MED ORDER — MIDAZOLAM HCL 5 MG/5ML IJ SOLN
INTRAMUSCULAR | Status: DC | PRN
  Administered 2013-01-16: 2 mg via INTRAVENOUS

## 2013-01-16 MED ORDER — WARFARIN SODIUM 5 MG PO TABS
5.0000 mg | ORAL_TABLET | Freq: Every day | ORAL | Status: DC
  Administered 2013-01-17 – 2013-01-18 (×2): 5 mg via ORAL
  Filled 2013-01-16 (×2): qty 1

## 2013-01-16 MED ORDER — DIPHENHYDRAMINE HCL 12.5 MG/5ML PO ELIX: 12.5000 mg | ORAL_SOLUTION | ORAL | Status: DC | PRN

## 2013-01-16 MED ORDER — MIDAZOLAM HCL 2 MG/2ML IJ SOLN
1.0000 mg | INTRAMUSCULAR | Status: DC | PRN
  Administered 2013-01-16: 2 mg via INTRAVENOUS

## 2013-01-16 MED ORDER — PREGABALIN 50 MG PO CAPS
50.0000 mg | ORAL_CAPSULE | Freq: Once | ORAL | Status: AC
  Administered 2013-01-16: 50 mg via ORAL
  Filled 2013-01-16: qty 1

## 2013-01-16 MED ORDER — CELECOXIB 100 MG PO CAPS
200.0000 mg | ORAL_CAPSULE | Freq: Two times a day (BID) | ORAL | Status: DC
  Administered 2013-01-16 – 2013-01-19 (×6): 200 mg via ORAL
  Filled 2013-01-16 (×6): qty 2

## 2013-01-16 MED ORDER — OXYCODONE HCL 5 MG PO TABS
5.0000 mg | ORAL_TABLET | ORAL | Status: DC | PRN
  Administered 2013-01-17: 5 mg via ORAL
  Filled 2013-01-16: qty 1

## 2013-01-16 MED ORDER — HYDROMORPHONE HCL PF 1 MG/ML IJ SOLN
1.0000 mg | INTRAMUSCULAR | Status: DC | PRN
  Administered 2013-01-16 – 2013-01-18 (×7): 1 mg via INTRAVENOUS
  Filled 2013-01-16 (×7): qty 1

## 2013-01-16 MED ORDER — ACETAMINOPHEN 325 MG PO TABS: 650.0000 mg | ORAL_TABLET | Freq: Four times a day (QID) | ORAL | Status: DC | PRN

## 2013-01-16 MED ORDER — CELECOXIB 100 MG PO CAPS
ORAL_CAPSULE | ORAL | Status: AC
  Filled 2013-01-16: qty 4

## 2013-01-16 MED ORDER — EPHEDRINE SULFATE 50 MG/ML IJ SOLN
INTRAMUSCULAR | Status: DC | PRN
  Administered 2013-01-16 (×3): 5 mg via INTRAVENOUS

## 2013-01-16 MED ORDER — CELECOXIB 100 MG PO CAPS
400.0000 mg | ORAL_CAPSULE | Freq: Once | ORAL | Status: DC
  Administered 2013-01-16: 400 mg via ORAL

## 2013-01-16 MED ORDER — CHLORHEXIDINE GLUCONATE 4 % EX LIQD: 60.0000 mL | Freq: Once | CUTANEOUS | Status: DC

## 2013-01-16 MED ORDER — FAMOTIDINE 20 MG PO TABS
20.0000 mg | ORAL_TABLET | Freq: Every day | ORAL | Status: DC
  Administered 2013-01-17 – 2013-01-19 (×3): 20 mg via ORAL
  Filled 2013-01-16 (×3): qty 1

## 2013-01-16 MED ORDER — METHOCARBAMOL 100 MG/ML IJ SOLN
500.0000 mg | Freq: Once | INTRAVENOUS | Status: DC
  Filled 2013-01-16: qty 5

## 2013-01-16 MED ORDER — BUPIVACAINE-EPINEPHRINE (PF) 0.5% -1:200000 IJ SOLN
INTRAMUSCULAR | Status: DC | PRN
  Administered 2013-01-16: 30 mL

## 2013-01-16 MED ORDER — LISINOPRIL 5 MG PO TABS
5.0000 mg | ORAL_TABLET | Freq: Every day | ORAL | Status: DC
  Administered 2013-01-17 – 2013-01-19 (×3): 5 mg via ORAL
  Filled 2013-01-16 (×3): qty 1

## 2013-01-16 MED ORDER — SODIUM CHLORIDE 0.9 % IR SOLN
Status: DC | PRN
  Administered 2013-01-16: 1000 mL

## 2013-01-16 MED ORDER — BUPIVACAINE IN DEXTROSE 0.75-8.25 % IT SOLN
INTRATHECAL | Status: AC
  Filled 2013-01-16: qty 2

## 2013-01-16 MED ORDER — OXYCODONE-ACETAMINOPHEN 5-325 MG PO TABS
1.0000 | ORAL_TABLET | ORAL | Status: DC
  Administered 2013-01-16 – 2013-01-19 (×18): 1 via ORAL
  Filled 2013-01-16 (×18): qty 1

## 2013-01-16 MED ORDER — ACETAMINOPHEN 10 MG/ML IV SOLN
INTRAVENOUS | Status: AC
  Filled 2013-01-16: qty 100

## 2013-01-16 MED ORDER — FENTANYL CITRATE 0.05 MG/ML IJ SOLN: 25.0000 ug | INTRAMUSCULAR | Status: DC | PRN

## 2013-01-16 MED ORDER — PROPOFOL INFUSION 10 MG/ML OPTIME
INTRAVENOUS | Status: DC | PRN
  Administered 2013-01-16: 20 ug/kg/min via INTRAVENOUS
  Administered 2013-01-16: 35 ug/kg/min via INTRAVENOUS

## 2013-01-16 MED ORDER — MENTHOL 3 MG MT LOZG: 1.0000 | LOZENGE | OROMUCOSAL | Status: DC | PRN

## 2013-01-16 MED ORDER — ONDANSETRON HCL 4 MG/2ML IJ SOLN
4.0000 mg | Freq: Once | INTRAMUSCULAR | Status: DC | PRN
  Filled 2013-01-16: qty 2

## 2013-01-16 MED ORDER — ONDANSETRON HCL 4 MG/2ML IJ SOLN
4.0000 mg | Freq: Once | INTRAMUSCULAR | Status: DC
  Administered 2013-01-16: 4 mg via INTRAVENOUS

## 2013-01-16 MED ORDER — ACETAMINOPHEN 650 MG RE SUPP: 650.0000 mg | Freq: Four times a day (QID) | RECTAL | Status: DC | PRN

## 2013-01-16 MED ORDER — CEFAZOLIN SODIUM-DEXTROSE 2-3 GM-% IV SOLR
2.0000 g | Freq: Four times a day (QID) | INTRAVENOUS | Status: AC
  Administered 2013-01-16 – 2013-01-17 (×3): 2 g via INTRAVENOUS
  Filled 2013-01-16 (×3): qty 50

## 2013-01-16 SURGICAL SUPPLY — 66 items
BAG HAMPER (MISCELLANEOUS) ×2 IMPLANT
BANDAGE ESMARK 6X9 LF (GAUZE/BANDAGES/DRESSINGS) ×1 IMPLANT
BIT DRILL 3.2X128 (BIT) ×2 IMPLANT
BLADE HEX COATED 2.75 (ELECTRODE) ×2 IMPLANT
BLADE SAG 18X100X1.27 (BLADE) IMPLANT
BLADE SAGITTAL 25.0X1.27X90 (BLADE) ×2 IMPLANT
BLADE SAW SAG 90X13X1.27 (BLADE) ×2 IMPLANT
BNDG ESMARK 6X9 LF (GAUZE/BANDAGES/DRESSINGS) ×2
BOWL SMART MIX CTS (DISPOSABLE) IMPLANT
CAPT FB KNEE W/COCR XLINK ×2 IMPLANT
CEMENT HV SMART SET (Cement) ×4 IMPLANT
CLOTH BEACON ORANGE TIMEOUT ST (SAFETY) ×2 IMPLANT
COVER LIGHT HANDLE STERIS (MISCELLANEOUS) ×4 IMPLANT
COVER PROBE W GEL 5X96 (DRAPES) ×2 IMPLANT
CUFF TOURNIQUET SINGLE 34IN LL (TOURNIQUET CUFF) ×2 IMPLANT
CUFF TOURNIQUET SINGLE 44IN (TOURNIQUET CUFF) IMPLANT
DECANTER SPIKE VIAL GLASS SM (MISCELLANEOUS) ×2 IMPLANT
DRAPE BACK TABLE (DRAPES) ×2 IMPLANT
DRAPE EXTREMITY T 121X128X90 (DRAPE) ×2 IMPLANT
DRSG MEPILEX BORDER 4X12 (GAUZE/BANDAGES/DRESSINGS) ×2 IMPLANT
DURAPREP 26ML APPLICATOR (WOUND CARE) ×4 IMPLANT
ELECT REM PT RETURN 9FT ADLT (ELECTROSURGICAL) ×2
ELECTRODE REM PT RTRN 9FT ADLT (ELECTROSURGICAL) ×1 IMPLANT
EVACUATOR 3/16  PVC DRAIN (DRAIN) ×1
EVACUATOR 3/16 PVC DRAIN (DRAIN) ×1 IMPLANT
FACESHIELD LNG OPTICON STERILE (SAFETY) ×2 IMPLANT
GLOVE ECLIPSE 6.5 STRL STRAW (GLOVE) ×2 IMPLANT
GLOVE ECLIPSE 7.0 STRL STRAW (GLOVE) ×2 IMPLANT
GLOVE EXAM NITRILE PF MED BLUE (GLOVE) ×4 IMPLANT
GLOVE INDICATOR 7.0 STRL GRN (GLOVE) ×4 IMPLANT
GLOVE OPTIFIT SS 8.0 STRL (GLOVE) ×2 IMPLANT
GLOVE SKINSENSE NS SZ8.0 LF (GLOVE) ×2
GLOVE SKINSENSE STRL SZ8.0 LF (GLOVE) ×2 IMPLANT
GLOVE SS N UNI LF 8.5 STRL (GLOVE) ×2 IMPLANT
GOWN STRL REIN XL XLG (GOWN DISPOSABLE) ×8 IMPLANT
HANDPIECE INTERPULSE COAX TIP (DISPOSABLE) ×1
HOOD W/PEELAWAY (MISCELLANEOUS) ×8 IMPLANT
INST SET MAJOR BONE (KITS) ×2 IMPLANT
IV NS IRRIG 3000ML ARTHROMATIC (IV SOLUTION) ×2 IMPLANT
KIT BLADEGUARD II DBL (SET/KITS/TRAYS/PACK) ×2 IMPLANT
KIT ROOM TURNOVER APOR (KITS) ×2 IMPLANT
MANIFOLD NEPTUNE II (INSTRUMENTS) ×2 IMPLANT
MARKER SKIN DUAL TIP RULER LAB (MISCELLANEOUS) ×2 IMPLANT
NEEDLE HYPO 21X1.5 SAFETY (NEEDLE) ×2 IMPLANT
NEEDLE HYPO 25X1 1.5 SAFETY (NEEDLE) ×2 IMPLANT
NS IRRIG 1000ML POUR BTL (IV SOLUTION) ×2 IMPLANT
PACK TOTAL JOINT (CUSTOM PROCEDURE TRAY) ×2 IMPLANT
PAD ARMBOARD 7.5X6 YLW CONV (MISCELLANEOUS) ×2 IMPLANT
PAD DANNIFLEX CPM (ORTHOPEDIC SUPPLIES) ×2 IMPLANT
PIN TROCAR 3 INCH (PIN) ×2 IMPLANT
SET BASIN LINEN APH (SET/KITS/TRAYS/PACK) ×2 IMPLANT
SET HNDPC FAN SPRY TIP SCT (DISPOSABLE) ×1 IMPLANT
SPONGE DRAIN TRACH 4X4 STRL 2S (GAUZE/BANDAGES/DRESSINGS) ×2 IMPLANT
STAPLER VISISTAT 35W (STAPLE) ×2 IMPLANT
SUT BRALON NAB BRD #1 30IN (SUTURE) ×4 IMPLANT
SUT MON AB 0 CT1 (SUTURE) ×4 IMPLANT
SUT MON AB 2-0 CT1 36 (SUTURE) ×2 IMPLANT
SYR 20CC LL (SYRINGE) IMPLANT
SYR 30ML LL (SYRINGE) ×2 IMPLANT
SYR BULB IRRIGATION 50ML (SYRINGE) ×2 IMPLANT
TAPE CLOTH SURG 4X10 WHT LF (GAUZE/BANDAGES/DRESSINGS) ×2 IMPLANT
TOWEL OR 17X26 4PK STRL BLUE (TOWEL DISPOSABLE) ×2 IMPLANT
TOWER CARTRIDGE SMART MIX (DISPOSABLE) ×2 IMPLANT
TRAY FOLEY CATH 16FR SILVER (SET/KITS/TRAYS/PACK) ×2 IMPLANT
WATER STERILE IRR 1000ML POUR (IV SOLUTION) ×8 IMPLANT
YANKAUER SUCT 12FT TUBE ARGYLE (SUCTIONS) ×2 IMPLANT

## 2013-01-16 NOTE — Anesthesia Postprocedure Evaluation (Signed)
  Anesthesia Post-op Note  Patient: David [name]  Procedure(s) Performed: Procedure(s): TOTAL KNEE ARTHROPLASTY (Left)  Patient Location: PACU  Anesthesia Type:Spinal  Level of Consciousness: awake, alert  and oriented  Airway and Oxygen Therapy: Patient Spontanous Breathing  Post-op Pain: none  Post-op Assessment: Post-op Vital signs reviewed, Patient's Cardiovascular Status Stable, Respiratory Function Stable, Patent Airway and No signs of Nausea or vomiting  Post-op Vital Signs: Reviewed and stable  116/87, 53, 16, SpO2 100, 36.4  Complications: No apparent anesthesia complications

## 2013-01-16 NOTE — Anesthesia Preprocedure Evaluation (Signed)
Anesthesia Evaluation  Patient identified by MRN, date of birth, ID band Patient awake    Reviewed: Allergy & Precautions, H&P , NPO status , Patient's Chart, lab work & pertinent test results  History of Anesthesia Complications Negative for: history of anesthetic complications  Airway Mallampati: II TM Distance: >3 FB     Dental  (+) Teeth Intact   Pulmonary neg pulmonary ROS,  breath sounds clear to auscultation        Cardiovascular +CHF (EF=20% ) + dysrhythmias Atrial Fibrillation Rhythm:Regular Rate:Normal     Neuro/Psych TIACVA, No Residual Symptoms    GI/Hepatic GERD-  Medicated and Controlled,(+)     substance abuse  alcohol use,   Endo/Other    Renal/GU Renal InsufficiencyRenal disease     Musculoskeletal   Abdominal   Peds  Hematology   Anesthesia Other Findings   Reproductive/Obstetrics                           Anesthesia Physical Anesthesia Plan  ASA: III  Anesthesia Plan: Spinal   Post-op Pain Management:    Induction: Intravenous  Airway Management Planned: Nasal Cannula  Additional Equipment:   Intra-op Plan:   Post-operative Plan:   Informed Consent: I have reviewed the patients History and Physical, chart, labs and discussed the procedure including the risks, benefits and alternatives for the proposed anesthesia with the patient or authorized representative who has indicated his/her understanding and acceptance.     Plan Discussed with:   Anesthesia Plan Comments: (Coumadin stopped 6 days ago.)        Anesthesia Quick Evaluation

## 2013-01-16 NOTE — Transfer of Care (Signed)
Immediate Anesthesia Transfer of Care Note  Patient: David [name]  Procedure(s) Performed: Procedure(s): TOTAL KNEE ARTHROPLASTY (Left)  Patient Location: PACU  Anesthesia Type:Spinal  Level of Consciousness: awake, alert  and oriented  Airway & Oxygen Therapy: Patient Spontanous Breathing and Patient connected to nasal cannula oxygen  Post-op Assessment: Report given to PACU RN  Post vital signs: Reviewed and stable  Complications: No apparent anesthesia complications

## 2013-01-16 NOTE — Interval H&P Note (Signed)
History and Physical Interval Note:  01/16/2013 7:15 AM  David [name]  has presented today for surgery, with the diagnosis of Osteoarthritis left knee  The various methods of treatment have been discussed with the patient and family. After consideration of risks, benefits and other options for treatment, the patient has consented to  Procedure(s): TOTAL KNEE ARTHROPLASTY (Left) as a surgical intervention .  The patient's history has been reviewed, patient examined, no change in status, stable for surgery.  I have reviewed the patient's chart and labs.  Questions were answered to the patient's satisfaction.     Fuller Canada

## 2013-01-16 NOTE — Progress Notes (Signed)
Left pedal pulse palpable. hemovac drain to left knee in place and not compressed.

## 2013-01-16 NOTE — Anesthesia Procedure Notes (Signed)
Spinal  Patient location during procedure: OR Start time: 01/16/2013 7:45 AM Staffing CRNA/Resident: Glynn Octave E Preanesthetic Checklist Completed: patient identified, site marked, surgical consent, pre-op evaluation, timeout performed, IV checked, risks and benefits discussed and monitors and equipment checked Spinal Block Patient position: right lateral decubitus Prep: Betadine Patient monitoring: heart rate, cardiac monitor, continuous pulse ox and blood pressure Approach: right paramedian Location: L3-4 Injection technique: single-shot Needle Needle type: Spinocan  Needle gauge: 22 G Needle length: 9 cm Assessment Sensory level: T8 Additional Notes ATTEMPTS:1 TRAY [id number] TRAY EXPIRATION DATE:03/2013

## 2013-01-16 NOTE — Brief Op Note (Signed)
01/16/2013  9:54 AM  PATIENT:  David [name]  52 y.o. male  PRE-OPERATIVE DIAGNOSIS:  Osteoarthritis left knee  POST-OPERATIVE DIAGNOSIS:  Osteoarthritis left knee  Findings: severe oa with moderate varus   depuy 47f 5t 41 patella 17.5 poly    PROCEDURE:  Procedure(s): TOTAL KNEE ARTHROPLASTY (Left)  SURGEON:  Surgeon(s) and Role:    * Vickki Hearing, MD - Primary  PHYSICIAN ASSISTANT:   ASSISTANTS: betty ashley and wayne mcfatter   ANESTHESIA:   spinal  EBL:  Total I/O In: 1000 [I.V.:1000] Out: -   BLOOD ADMINISTERED:none  DRAINS: (1) Hemovact drain(s) in the joint with  Suction Open   LOCAL MEDICATIONS USED:  MARCAINE  0.5% with epi 30cc and exparel 20 cc diluted with 40 cc of saline   SPECIMEN:  No Specimen  DISPOSITION OF SPECIMEN:  N/A  COUNTS:  YES  TOURNIQUET:   DICTATION: .Dragon Dictation  PLAN OF CARE: Admit to inpatient   PATIENT DISPOSITION:  PACU - hemodynamically stable.   Delay start of Pharmacological VTE agent (>24hrs) due to surgical blood loss or risk of bleeding: yes

## 2013-01-16 NOTE — Progress Notes (Signed)
UR chart review completed.  

## 2013-01-16 NOTE — Evaluation (Signed)
Physical Therapy Evaluation Patient Details Name: David [name] MRN: [id number] DOB: 1960-06-15 Today's Date: 01/16/2013 Time: 4098-1191 PT Time Calculation (min): 52 min  PT Assessment / Plan / Recommendation History of Present Illness  Pt is admitted for left TKR done earlier today.  He had been totally independent PTA but was needing pain med q4hours to control his pain.  He lives with his wife on the 2nd floor of an apartment building, wife reports 28 steps between 1st and 2nd floor.  They are both quite concerned about pt being able to negotiate these steps at d/c.  Clinical Impression   Pt is seen for eval/tx.  He is alert and oriented, cooperative but did not want to get OOB this afternoon.  He was instructed in positioning of the LLE and general protocol of care of the knee.  He was instructed in initial there ex for increasing ROM and strength..range of motion=0-92 degrees, AA..he is able to do an independent SAQ and SLR with the LLE.  We will progress OOB tomorrow.  I anticipate that he will progress well and will be able to go home at d/c.  If I am wrong, we will need to consider transfer to SNF.    PT Assessment  Patient needs continued PT services    Follow Up Recommendations  Home health PT    Does the patient have the potential to tolerate intense rehabilitation      Barriers to Discharge Inaccessible home environment 28 steps to enter his apartment    Equipment Recommendations  Rolling walker with 5" wheels;3in1 (PT)    Recommendations for Other Services     Frequency 7X/week    Precautions / Restrictions Precautions Precautions: Knee Restrictions Weight Bearing Restrictions: No   Pertinent Vitals/Pain       Mobility  Bed Mobility Bed Mobility: Not assessed Details for Bed Mobility Assistance: Pt prefers to wait until tomorrow to get up OOB    Exercises Total Joint Exercises Ankle Circles/Pumps: AROM;Both;10 reps;Supine Quad Sets: AROM;Both;10  reps;Supine Short Arc Quad: AROM;10 reps;Left;Supine Heel Slides: AAROM;Left;10 reps;Supine   PT Diagnosis: Difficulty walking;Abnormality of gait;Acute pain  PT Problem List: Decreased strength;Decreased range of motion;Decreased activity tolerance;Decreased mobility;Decreased knowledge of use of DME;Decreased knowledge of precautions;Cardiopulmonary status limiting activity (pt has Afib) PT Treatment Interventions: DME instruction;Gait training;Stair training;Functional mobility training;Therapeutic exercise;Patient/family education     PT Goals(Current goals can be found in the care plan section) Acute Rehab PT Goals Patient Stated Goal: return to independence, pain free PT Goal Formulation: With patient Time For Goal Achievement: 01/30/13 Potential to Achieve Goals: Good  Visit Information  Last PT Received On: 01/16/13 History of Present Illness: Pt is admitted for left TKR done earlier today.  He had been totally independent PTA but was needing pain med q4hours to control his pain.  He lives with his wife on the 2nd floor of an apartment building, wife reports 28 steps between 1st and 2nd floor.  They are both quite concerned about pt being able to negotiate these steps at d/c.       Prior Functioning  Home Living Family/patient expects to be discharged to:: Private residence Living Arrangements: Spouse/significant other Available Help at Discharge: Family;Available 24 hours/day Type of Home: Apartment Home Access: Stairs to enter Entrance Stairs-Number of Steps: 28 Entrance Stairs-Rails: Right Home Layout: One level Home Equipment: None Prior Function Level of Independence: Independent Communication Communication: No difficulties    Cognition  Cognition Arousal/Alertness: Awake/alert Behavior During Therapy: [facility]  for tasks assessed/performed Overall Cognitive Status: Within Functional Limits for tasks assessed    Extremity/Trunk Assessment Lower Extremity  Assessment Lower Extremity Assessment: LLE deficits/detail LLE Deficits / Details: ROM =0-92 degrees...pt is able to do an independent SLR and SAQ...hemovac is in place Cervical / Trunk Assessment Cervical / Trunk Assessment: Normal   Balance    End of Session PT - End of Session Equipment Utilized During Treatment: Gait belt Activity Tolerance: Patient tolerated treatment well Patient left: in bed;with call bell/phone within reach;with family/visitor present Nurse Communication: Mobility status  GP     Konrad Penta 01/16/2013, 3:19 PM

## 2013-01-17 LAB — BASIC METABOLIC PANEL
BUN: 19 mg/dL (ref 6–23)
CO2: 28 mEq/L (ref 19–32)
Calcium: 8 mg/dL — ABNORMAL LOW (ref 8.4–10.5)
Chloride: 101 mEq/L (ref 96–112)
Creatinine, Ser: 1.48 mg/dL — ABNORMAL HIGH (ref 0.50–1.35)
GFR calc Af Amer: 62 mL/min — ABNORMAL LOW (ref >90)
GFR calc non Af Amer: 53 mL/min — ABNORMAL LOW (ref >90)
Glucose, Bld: 125 mg/dL — ABNORMAL HIGH (ref 70–99)
Potassium: 3.8 mEq/L (ref 3.5–5.1)
Sodium: 136 mEq/L (ref 135–145)

## 2013-01-17 LAB — CBC
HCT: 30.5 % — ABNORMAL LOW (ref 39.0–52.0)
Hemoglobin: 10.2 g/dL — ABNORMAL LOW (ref 13.0–17.0)
MCH: 31.8 pg (ref 26.0–34.0)
MCHC: 33.4 g/dL (ref 30.0–36.0)
MCV: 95 fL (ref 78.0–100.0)
Platelets: 278 10*3/uL (ref 150–400)
RBC: 3.21 MIL/uL — ABNORMAL LOW (ref 4.22–5.81)
RDW: 13.4 % (ref 11.5–15.5)
WBC: 10 10*3/uL (ref 4.0–10.5)

## 2013-01-17 LAB — PROTIME-INR
INR: 1.11 (ref 0.00–1.49)
Prothrombin Time: 14.1 seconds (ref 11.6–15.2)

## 2013-01-17 NOTE — Anesthesia Postprocedure Evaluation (Signed)
  Anesthesia Post-op Note  Patient: David [name]  Procedure(s) Performed: Procedure(s): TOTAL KNEE ARTHROPLASTY (Left)  Patient Location: Nursing Unit  Anesthesia Type:Spinal  Level of Consciousness: awake, alert  and oriented  Airway and Oxygen Therapy: Patient Spontanous Breathing  Post-op Pain: mild  Post-op Assessment: Post-op Vital signs reviewed, Patient's Cardiovascular Status Stable, Respiratory Function Stable, Patent Airway and No signs of Nausea or vomiting  Post-op Vital Signs: Reviewed and stable  Complications: No apparent anesthesia complications

## 2013-01-17 NOTE — Progress Notes (Addendum)
Subjective: 1 Day Post-Op Procedure(s) (LRB): TOTAL KNEE ARTHROPLASTY (Left) Patient reports pain as mild.    Objective: Vital signs in last 24 hours: Temp:  [97.5 F (36.4 C)-98 F (36.7 C)] 97.8 F (36.6 C) (10/11 0816) Pulse Rate:  [47-79] 57 (10/11 0816) Resp:  [13-20] 18 (10/11 0816) BP: (110-153)/(66-96) 152/87 mmHg (10/11 0816) SpO2:  [77 %-100 %] 100 % (10/11 0816) Weight:  [224 lb 13.9 oz (102 kg)] 224 lb 13.9 oz (102 kg) (10/10 1125)  Intake/Output from previous day: 10/10 0701 - 10/11 0700 In: 3108.3 [I.V.:2958.3; IV Piggyback:150] Out: 1150 [Urine:850; Drains:300] Intake/Output this shift: Total I/O In: 240 [P.O.:240] Out: 100 [Urine:100]   Recent Labs  01/17/13 0618  HGB 10.2*    Recent Labs  01/17/13 0618  WBC 10.0  RBC 3.21*  HCT 30.5*  PLT 278    Recent Labs  01/17/13 0618  NA 136  K 3.8  CL 101  CO2 28  BUN 19  CREATININE 1.48*  GLUCOSE 125*  CALCIUM 8.0*    Recent Labs  01/16/13 1247 01/17/13 0618  INR 1.15 1.11    Neurologically intact ABD soft Neurovascular intact Sensation intact distally Dorsiflexion/Plantar flexion intact Incision: dressing C/D/I Compartment soft  Assessment/Plan: 1 Day Post-Op Procedure(s) (LRB): TOTAL KNEE ARTHROPLASTY (Left) PT DRAIN OUT   Fuller Canada 01/17/2013, 8:55 AM

## 2013-01-17 NOTE — Op Note (Signed)
01/16/2013  9:54 AM  PATIENT:  David [name]  52 y.o. male  PRE-OPERATIVE DIAGNOSIS:  Osteoarthritis left knee  POST-OPERATIVE DIAGNOSIS:  Osteoarthritis left knee  Findings: severe oa with moderate varus   depuy 60f 5t 41 patella 17.5 poly    PROCEDURE:  Procedure(s): TOTAL KNEE ARTHROPLASTY (Left)  Details of procedure The patient was identified in the preoperative holding area and the left knee was confirmed as the surgical site marked as such. A chart review was completed and chart update was finalized. The patient was then taken back to the operating suite where he had spinal anesthesia and was placed supine on the operating table. A catheter was placed in the bladder. The left leg was prepped and draped sterilely. Timeout was completed.  Surgical incision was made over the left knee centered at the patella and extended proximally and distally. This was taken down to the extensor mechanism. A medial arthrotomy was performed. The patella was everted. The patellofemoral ligament was released. The knee was placed in flexion. The anterior cruciate ligament was deficient. The notch was stenotic. An osteotome was used to open up the notch and remove the PCL.  Femoral preparation: A guide rod was placed in the Center of the femur with the distal cutting jig set at 5 left 10 mm resection. This block was pinned in place and the distal femur was resected. We then measured the femur with the standard measuring guide. The femur measured 3-1/2 but seemed to be larger than that medial lateral. We decided to go with a size 4 with the option of downsizing later. Collateral ligament protective retractors were placed. We pinned the 4 block and made before distal cuts.  Tibial preparation: The external alignment guide was used to prepare the tibia. We used standard landmarks of medial and lateral tibial spine medial third tibial tubercle medial and lateral malleolar. We used a 4 mm proximal  resection stylus setting on the medial side which was the more worn side and pinned for anatomic slope. We resected the proximal tibia which measured 6 mm on the worn side and 16 mm on the unwarranted side. This removed the menisci which were remaining medially and laterally.  Spacer blocks were placed and it was noted that the medial side was tight in flexion. The knee was stable in extension with a 17.5 block. We further released the medial side until the 17.5 block fit in smoothly.  We then cut the notch for the femur with a size 4 notch cutting block.  We then did trial reduction and set the tibial rotation. The tibia measured a size 5. We drilled the lug holes for the femur  We then punched the tibia, per technique  Patellar preparation: The patella was skeletonized proximally and distally protecting the patellar tendon and quadriceps tendon attachment. The patella measured size 26 we resected down to a size 15. A size 41, 11.5 thickness patella template was placed and the 3 lug holes were drilled  Cement preparation application: The knee was irrigated and the bone was dried. The cement was prepared on the back table with suction and mixing.  We then applied the cement on the implants and removed excess cement while the implants were placed. Excess cement was removed. The knee was taken through range of motion and found to be stable in extension 125 of flexion normal patellar tracking  We then injected knee with 60 cc of dilute Exparel.  Closure: Final irrigation was performed. 1 Bralon  suture was used to close the extensor mechanism; a Hemovac drain was placed, 30 cc of Marcaine was injected into the joint; followed by 0 and 2-0 monocryl to close the subcutaneous tissues. The wound was reapproximated with staples.  The tourniquet was released at 90 minutes.  Sterile dressing was applied along with TED hose stocking  Drain was left uncharged.   SURGEON:  Surgeon(s) and Role:    *  Vickki Hearing, MD - Primary  PHYSICIAN ASSISTANT:   ASSISTANTS: betty ashley and wayne mcfatter   ANESTHESIA:   spinal  EBL:  Total I/O In: 1000 [I.V.:1000] Out: -   BLOOD ADMINISTERED:none  DRAINS: (1) Hemovact drain(s) in the joint with  Suction Open   LOCAL MEDICATIONS USED:  MARCAINE  0.5% with epi 30cc and exparel 20 cc diluted with 40 cc of saline   SPECIMEN:  No Specimen  DISPOSITION OF SPECIMEN:  N/A  COUNTS:  YES  TOURNIQUET:   DICTATION: .Dragon Dictation  PLAN OF CARE: Admit to inpatient   PATIENT DISPOSITION:  PACU - hemodynamically stable.   Delay start of Pharmacological VTE agent (>24hrs) due to surgical blood loss or risk of bleeding: yes

## 2013-01-17 NOTE — Progress Notes (Signed)
Physical Therapy Treatment Patient Details Name: David [name] MRN: [id number] DOB: 28-Jul-1960 Today's Date: 01/17/2013 Time: 1340-1500 PT Time Calculation (min): 80 min  PT Assessment / Plan / Recommendation  History of Present Illness     PT Comments   Pt progressing well though limited by pain with knee movement.  Decreased AROM today due to increased swelling 8-70 degrees.  Pt independent with bed mobility with initial verbal cueing for hand placement for safety and assistance.  Gait training with RW x 60 supervision with decreased step length Rt and decreased stance phase Lt LE, verbal cueing to improve gait mechanics.  Pt given knee replacement handbook and educated on key points dealing with treatment inside handout.  Pt left in bed with CPM at 60 degrees for 8 hours.  Nursing informing and time written on board in room to stop CPM.  Pt stated knee pain scale 5/10 at end of session, pain meds given by nursing.  Pt with call bell within reach, nursing and family members in room at end.    Follow Up Recommendations        Does the patient have the potential to tolerate intense rehabilitation     Barriers to Discharge        Equipment Recommendations       Recommendations for Other Services    Frequency     Progress towards PT Goals Progress towards PT goals: Progressing toward goals  Plan      Precautions / Restrictions Precautions Precautions: Knee Precaution Booklet Issued: Yes (comment) Restrictions Weight Bearing Restrictions: Yes LLE Weight Bearing: Weight bearing as tolerated    Mobility  Bed Mobility Bed Mobility: Left Sidelying to Sit Left Sidelying to Sit: 7: Independent Details for Bed Mobility Assistance: indepnedent with bed mobility. Transfers Transfers: Sit to Stand;Stand to Sit Sit to Stand: 5: Supervision;With upper extremity assist;From bed;From chair/3-in-1 Stand to Sit: 5: Supervision;With upper extremity assist;To bed;To  chair/3-in-1 Ambulation/Gait Ambulation/Gait Assistance: 5: Supervision Ambulation Distance (Feet): 60 Feet Assistive device: Rolling walker Gait Pattern: Decreased step length - left;Decreased stance time - left Gait velocity: slow    Exercises Total Joint Exercises Ankle Circles/Pumps: AROM;Both;10 reps;Supine Quad Sets: AROM;Both;10 reps;Supine Short Arc Quad: AAROM;Left;10 reps;Seated Heel Slides: AAROM;Left;10 reps;Supine Straight Leg Raises: AAROM;Left;AROM;Right;10 reps;Supine Goniometric ROM: 8-70   PT Diagnosis:    PT Problem List:   PT Treatment Interventions:     PT Goals (current goals can now be found in the care plan section)    Visit Information  Last PT Received On: 01/17/13    Subjective Data   Pt stated pain scale 6-7/10 prior pain meds, pain scale 0/10 at beginning of session with pain meds.   Cognition  Cognition Arousal/Alertness: Awake/alert Behavior During Therapy: WFL for tasks assessed/performed Overall Cognitive Status: Within Functional Limits for tasks assessed    Balance     End of Session PT - End of Session Equipment Utilized During Treatment: Gait belt Activity Tolerance: Patient tolerated treatment well;Patient limited by pain Patient left: in bed;in CPM;with call bell/phone within reach;with nursing/sitter in room;with family/visitor present Nurse Communication: Mobility status CPM Left Knee Left Knee Flexion (Degrees): 60 Left Knee Extension (Degrees): 0   GP     Juel Burrow 01/17/2013, 3:15 PM

## 2013-01-18 LAB — CBC
HCT: 26.8 % — ABNORMAL LOW (ref 39.0–52.0)
Hemoglobin: 9.1 g/dL — ABNORMAL LOW (ref 13.0–17.0)
MCH: 32 pg (ref 26.0–34.0)
MCHC: 34 g/dL (ref 30.0–36.0)
MCV: 94.4 fL (ref 78.0–100.0)
Platelets: 225 10*3/uL (ref 150–400)
RBC: 2.84 MIL/uL — ABNORMAL LOW (ref 4.22–5.81)
RDW: 13.4 % (ref 11.5–15.5)
WBC: 12.9 10*3/uL — ABNORMAL HIGH (ref 4.0–10.5)

## 2013-01-18 LAB — PROTIME-INR
INR: 1.28 (ref 0.00–1.49)
Prothrombin Time: 15.7 seconds — ABNORMAL HIGH (ref 11.6–15.2)

## 2013-01-18 NOTE — Progress Notes (Signed)
Physical Therapy Treatment Patient Details Name: ERIKSON DANZY MRN: [id number] DOB: 1960-06-05 Today's Date: 01/18/2013 Time: 0915-1005 PT Time Calculation (min): 50 min  PT Assessment / Plan / Recommendation  History of Present Illness     PT Comments   Pt progressing well towards goals.  Pt independent with bed mobility with min assistance with supine to sit with Lt LE.  Increased distance with gait training with RW and supervision with min cueing for posture and to increase knee flexion for more normalized gait mechanics.  Pt in chair with call bell within reach, with LE elevated and ice applied for pain and edema control.  AAROM 6-70 degrees today.  CPM adjusted for 70 degrees flexion and nursing advised to be in CPM for 8 hours once returns to bed.    Follow Up Recommendations        Does the patient have the potential to tolerate intense rehabilitation     Barriers to Discharge        Equipment Recommendations       Recommendations for Other Services    Frequency     Progress towards PT Goals Progress towards PT goals: Progressing toward goals  Plan      Precautions / Restrictions Precautions Precautions: Knee Restrictions Weight Bearing Restrictions: Yes LLE Weight Bearing: Weight bearing as tolerated   Pertinent Vitals/Pain Pain scale 3/10 initial treatment and 6/10 at end of session.  Nursing provided pain medication and ice applied to assist with pain control.      Mobility  Bed Mobility Bed Mobility: Left Sidelying to Sit Left Sidelying to Sit: 7: Independent Details for Bed Mobility Assistance: independent with bed mobility, min assistance with Lt LE with supine to sit Transfers Transfers: Sit to Stand;Stand to Sit Sit to Stand: 5: Supervision;With upper extremity assist;From bed Stand to Sit: 5: Supervision;With upper extremity assist;To chair/3-in-1 Ambulation/Gait Ambulation/Gait Assistance: 5: Supervision Ambulation Distance (Feet): 275  Feet Assistive device: Rolling walker Gait Pattern: Decreased stance time - left;Decreased step length - right;Decreased hip/knee flexion - left;Decreased weight shift to left Gait velocity: WNL Stairs: No    Exercises Total Joint Exercises Ankle Circles/Pumps: AROM;10 reps;Supine;Left Quad Sets: AROM;10 reps;Supine;Left Short Arc QuadBarbaraann Boys;Left;10 reps;Seated Heel Slides: AAROM;Left;10 reps;Supine Straight Leg Raises: AAROM;Left;10 reps;Supine Goniometric ROM: 6-70   PT Diagnosis:    PT Problem List:   PT Treatment Interventions:     PT Goals (current goals can now be found in the care plan section)    Visit Information  Last PT Received On: 01/18/13    Subjective Data   Pt stated pain scale 2-3/10   Cognition  Cognition Arousal/Alertness: Awake/alert Behavior During Therapy: WFL for tasks assessed/performed Overall Cognitive Status: Within Functional Limits for tasks assessed    Balance     End of Session PT - End of Session Equipment Utilized During Treatment: Gait belt Activity Tolerance: Patient tolerated treatment well Patient left: in chair;with call bell/phone within reach;with nursing/sitter in room;with family/visitor present Nurse Communication: Mobility status   GP     Juel Burrow 01/18/2013, 10:11 AM

## 2013-01-18 NOTE — Progress Notes (Signed)
Chart review only Subjective: 2 Days Post-Op Procedure(s) (LRB): TOTAL KNEE ARTHROPLASTY (Left)  Pain reportedly controlled  Objective: Vital signs in last 24 hours: Temp:  [98.2 F (36.8 C)-98.5 F (36.9 C)] 98.4 F (36.9 C) (10/12 1500) Pulse Rate:  [57-82] 64 (10/12 1500) Resp:  [16-20] 20 (10/12 1600) BP: (121-157)/(74-87) 128/76 mmHg (10/12 1500) SpO2:  [96 %-100 %] 98 % (10/12 1500)  Intake/Output from previous day: 10/11 0701 - 10/12 0700 In: 1794.2 [P.O.:960; I.V.:834.2] Out: 650 [Urine:650] Intake/Output this shift:     Recent Labs  01/17/13 0618 01/18/13 0552  HGB 10.2* 9.1*    Recent Labs  01/17/13 0618 01/18/13 0552  WBC 10.0 12.9*  RBC 3.21* 2.84*  HCT 30.5* 26.8*  PLT 278 225    Recent Labs  01/17/13 0618  NA 136  K 3.8  CL 101  CO2 28  BUN 19  CREATININE 1.48*  GLUCOSE 125*  CALCIUM 8.0*    Recent Labs  01/17/13 0618 01/18/13 0552  INR 1.11 1.28      Assessment/Plan: 2 Days Post-Op Procedure(s) (LRB): TOTAL KNEE ARTHROPLASTY (Left) Continue PT David [name] 01/18/2013, 8:32 PM

## 2013-01-18 NOTE — Plan of Care (Signed)
Problem: Acute Rehab PT Goals(only PT should resolve) Goal: Pt Will Go Up/Down Stairs Pt educated on technique with stair training, given handout for stair training with RW

## 2013-01-19 ENCOUNTER — Encounter (HOSPITAL_COMMUNITY): Payer: Self-pay | Admitting: Orthopedic Surgery

## 2013-01-19 ENCOUNTER — Ambulatory Visit: Payer: BC Managed Care – PPO | Admitting: Orthopedic Surgery

## 2013-01-19 LAB — CBC
HCT: 24.4 % — ABNORMAL LOW (ref 39.0–52.0)
Hemoglobin: 8.4 g/dL — ABNORMAL LOW (ref 13.0–17.0)
MCH: 32.3 pg (ref 26.0–34.0)
MCHC: 34.4 g/dL (ref 30.0–36.0)
MCV: 93.8 fL (ref 78.0–100.0)
Platelets: 229 10*3/uL (ref 150–400)
RBC: 2.6 MIL/uL — ABNORMAL LOW (ref 4.22–5.81)
RDW: 13.4 % (ref 11.5–15.5)
WBC: 12.3 10*3/uL — ABNORMAL HIGH (ref 4.0–10.5)

## 2013-01-19 LAB — PROTIME-INR
INR: 1.3 (ref 0.00–1.49)
Prothrombin Time: 15.9 seconds — ABNORMAL HIGH (ref 11.6–15.2)

## 2013-01-19 MED ORDER — WARFARIN SODIUM 5 MG PO TABS: 5.0000 mg | ORAL_TABLET | Freq: Every day | ORAL | Status: DC

## 2013-01-19 MED ORDER — OXYCODONE-ACETAMINOPHEN 5-325 MG PO TABS: 1.0000 | ORAL_TABLET | ORAL | Status: DC

## 2013-01-19 MED ORDER — POLYETHYLENE GLYCOL 3350 17 G PO PACK: 17.0000 g | PACK | Freq: Every day | ORAL | Status: DC

## 2013-01-19 NOTE — Progress Notes (Signed)
UR chart review completed.  

## 2013-01-19 NOTE — Progress Notes (Signed)
Physical Therapy Treatment Patient Details Name: David [name] MRN: [id number] DOB: February 17, 1961 Today's Date: 01/19/2013 Time: 0921-1015 PT Time Calculation (min): 54 min  PT Assessment / Plan / Recommendation  History of Present Illness     PT Comments   Pt is being discharged today.  He has been quite concerned about climbing steps to his apartment.  Prior to stair climbing, he was instructed in HEP of ROM and strengthening of LLE.  AA ROM is 4-85 degrees.  He is able to do an independent SAQ (lacks a few degrees of full extension) and SLR.  He was instructed in self stretching of knee with bridging for extension and use of a sheet to assist knee flexion in supine.  He was also instructed in seated knee flexion using his RLE to assist stretch.  He is ambulating nearly FWB left with a walker, cues for equalizing step length.  He was instructed in climbing a full flight of steps using one rail and he had no difficulty managing the steps.  He should transition well to home.  Follow Up Recommendations        Does the patient have the potential to tolerate intense rehabilitation     Barriers to Discharge        Equipment Recommendations       Recommendations for Other Services    Frequency     Progress towards PT Goals Progress towards PT goals: Goals met/education completed, patient discharged from PT  Plan Current plan remains appropriate    Precautions / Restrictions Restrictions Weight Bearing Restrictions: Yes LLE Weight Bearing: Weight bearing as tolerated   Pertinent Vitals/Pain     Mobility  Bed Mobility Left Sidelying to Sit: 7: Independent Transfers Sit to Stand: 7: Independent;With upper extremity assist;From bed Stand to Sit: 7: Independent;With upper extremity assist;To chair/3-in-1 Ambulation/Gait Ambulation/Gait Assistance: 6: Modified independent (Device/Increase time) Ambulation Distance (Feet): 300 Feet Assistive device: Rolling walker Gait Pattern:  Decreased stance time - left General Gait Details: pt was instructed in decreasing step length left to equalize stride Stairs: Yes Stairs Assistance: 6: Modified independent (Device/Increase time) Stair Management Technique: One rail Right Number of Stairs: 12 Wheelchair Mobility Wheelchair Mobility: No    Exercises Total Joint Exercises Ankle Circles/Pumps: AROM;Both;10 reps;Supine Quad Sets: AROM;Both;10 reps;Supine Short Arc Quad: AROM;10 reps;Supine;Left Heel Slides: AAROM;Left;10 reps;Supine Straight Leg Raises: AROM;Left;10 reps;Supine Goniometric ROM: 4-85 degrees   PT Diagnosis:    PT Problem List:   PT Treatment Interventions:     PT Goals (current goals can now be found in the care plan section)    Visit Information  Last PT Received On: 01/19/13    Subjective Data      Cognition       Balance     End of Session PT - End of Session Equipment Utilized During Treatment: Gait belt Activity Tolerance: Patient tolerated treatment well Patient left: in bed;with call bell/phone within reach   GP     Myrlene Broker L 01/19/2013, 10:21 AM

## 2013-01-19 NOTE — Discharge Summary (Signed)
Physician Discharge Summary  Patient ID: David [name] MRN: [id number] DOB/AGE: 1961-03-19 52 y.o.  Admit date: 01/16/2013 Discharge date: 01/19/2013  Admission Diagnoses: OA LEFT KNEE   Discharge Diagnoses: SAME  Active Problems:   * No active hospital problems. *   Discharged Condition: good  Hospital Course: UNREMARKABLE, NORMAL POST OP COURSE   Consults: None  Significant Diagnostic Studies: labs:  Hemoglobin & Hematocrit     Component Value Date/Time   HGB 8.4* 01/19/2013 0446   HCT 24.4* 01/19/2013 0446   INR/Prothrombin Time: 1.3 ON 5 MG DAILY   Treatments: surgery: LEFT TKA DEPUY SIGMA FB, PS 33F 5T 17.5 POLY   Discharge Exam: Blood pressure 121/60, pulse 72, temperature 98.1 F (36.7 C), temperature source Oral, resp. rate 18, height 5\' 11"  (1.803 m), weight 224 lb 13.9 oz (102 kg), SpO2 98.00%. Incision/Wound:CLEAN   Disposition: 01-Home or Self Care  Discharge Orders   Future Appointments Provider Department Dept Phone   01/29/2013 9:45 AM Vickki Hearing, MD [facility] [phone]   04/06/2013 9:00 AM Jonelle Sidle, MD [facility] [phone]   Future Orders Complete By Expires   Call MD / Call 911  As directed    Comments:     If you experience chest pain or shortness of breath, CALL 911 and be transported to the hospital emergency room.  If you develope a fever above 101 F, pus (white drainage) or increased drainage or redness at the wound, or calf pain, call your surgeon's office.   Change dressing  As directed    Comments:     Change dressing on today, then change the dressing daily with sterile 4 x 4 inch gauze dressing and apply TED hose.  You may clean the incision with alcohol prior to redressing.   Constipation Prevention  As directed    Comments:     Drink plenty of fluids.  Prune juice may be helpful.  You may use a stool softener, such as Colace (over the counter) 100 mg twice a  day.  Use MiraLax (over the counter) for constipation as needed.   CPM  As directed    Comments:     Continuous passive motion machine (CPM):      Use the CPM from 0 to 70 for 6 hours per day.      You may increase by 10 per day.  You may break it up into 2 or 3 sessions per day.      Use CPM for 3 weeks or until you are told to stop.   Diet - low sodium heart healthy  As directed    Driving restrictions  As directed    Comments:     No driving for 2 weeks   Face-to-face encounter (required for Medicare/Medicaid patients)  As directed    Comments:     I Fuller Canada certify that this patient is under my care and that I, or a nurse practitioner or physician's assistant working with me, had a face-to-face encounter that meets the physician face-to-face encounter requirements with this patient on 01/19/2013. The encounter with the patient was in whole, or in part for the following medical condition(s) which is the primary reason for home health care (List medical condition): osteoarthritis left knee , s/p knee replacement, warfarin PT/INR levels   Questions:     The encounter with the patient was in whole, or in part, for the following medical condition, which is the primary  reason for home health care:  knee replacement , warfarin management   I certify that, based on my findings, the following services are medically necessary home health services:  Nursing   Physical therapy   My clinical findings support the need for the above services:  Unable to leave home safely without assistance and/or assistive device   Further, I certify that my clinical findings support that this patient is homebound due to:  Ambulates short distances less than 300 feet   Reason for Medically Necessary Home Health Services:  Therapy- Investment banker, operational, Patent examiner   Home Health  As directed    Questions:     To provide the following care/treatments:  PT   RN   Increase activity slowly as  tolerated  As directed    TED hose  As directed    Comments:     Use stockings (TED hose) for 4 weeks on both leg(s).  You may remove them at night for sleeping.       Medication List    STOP taking these medications       HYDROcodone-acetaminophen 7.5-325 MG per tablet  Commonly known as:  NORCO      TAKE these medications       allopurinol 300 MG tablet  Commonly known as:  ZYLOPRIM  Take 300 mg by mouth daily.     amiodarone 200 MG tablet  Commonly known as:  PACERONE  Take 1 tablet (200 mg total) by mouth every morning.     carvedilol 3.125 MG tablet  Commonly known as:  COREG  Take 3.125 mg by mouth 2 (two) times daily with a meal.     famotidine 20 MG tablet  Commonly known as:  PEPCID  Take 20 mg by mouth daily.     furosemide 40 MG tablet  Commonly known as:  LASIX  Take 0.5 tablets (20 mg total) by mouth every morning.     lisinopril 5 MG tablet  Commonly known as:  PRINIVIL,ZESTRIL  Take 1 tablet (5 mg total) by mouth daily.     oxyCODONE-acetaminophen 5-325 MG per tablet  Commonly known as:  PERCOCET/ROXICET  Take 1 tablet by mouth every 4 (four) hours.     polyethylene glycol packet  Commonly known as:  MIRALAX / GLYCOLAX  Take 17 g by mouth daily.     warfarin 5 MG tablet  Commonly known as:  COUMADIN  Take 1 tablet (5 mg total) by mouth daily at 6 PM.           Follow-up Information   Follow up with Fuller Canada, MD.   Specialties:  Orthopedic Surgery, Radiology   Contact information:   [address] [place] [address] [phone]       Signed: Fuller Canada 01/19/2013, 8:41 AM

## 2013-01-19 NOTE — Clinical Social Work Note (Signed)
CSW received referral for possible SNF. PT worked with pt over weekend and recommendation is for home health. CSW will sign off but can be reconsulted if needed.  Derenda Fennel, [place] [phone]

## 2013-01-19 NOTE — Care Management Note (Signed)
    Page 1 of 2   01/19/2013     9:42:51 AM   CARE MANAGEMENT NOTE 01/19/2013  Patient:  David [name], David [name]   Account Number:  [id number]  Date Initiated:  01/19/2013  Documentation initiated by:  Sharrie Rothman  Subjective/Objective Assessment:   Pt admitted from home s/p total knee surgery.Pt lives with his wife in an apt and will return home. Pt is fairly independent with ADL's prior to surgery.     Action/Plan:   Pt chose [facility] for RN and PT and DME. Rolling walker will be delivered to pts room prior to discharge. CPM and 3N1 will be delivered to pts home. Alroy Bailiff of [facility] is aware of [facility] referral and services will start within 48 hours. PT and pts   Anticipated DC Date:  01/19/2013   Anticipated DC Plan:  HOME W HOME HEALTH SERVICES      DC Planning Services  CM consult      PAC Choice  DURABLE MEDICAL EQUIPMENT  HOME HEALTH   Choice offered to / List presented to:  C-1 Patient   DME arranged  3-N-1  CPM  WALKER - ROLLING      DME agency  [facility]     HH arranged  HH-1 RN  HH-2 PT      [facility] agency  [facility]   Status of service:  Completed, signed off Medicare Important Message given?   (If response is "NO", the following Medicare IM given date fields will be blank) Date Medicare IM given:   Date Additional Medicare IM given:    Discharge Disposition:  HOME W HOME HEALTH SERVICES  Per UR Regulation:    If discussed at Long Length of Stay Meetings, dates discussed:    Comments:  01/19/13 0940 Arlyss Queen, RN BSN CM nurse aware of discharge arrangements.

## 2013-01-20 ENCOUNTER — Telehealth: Payer: Self-pay | Admitting: *Deleted

## 2013-01-20 ENCOUNTER — Telehealth: Payer: Self-pay | Admitting: Cardiology

## 2013-01-20 ENCOUNTER — Telehealth: Payer: Self-pay | Admitting: Orthopedic Surgery

## 2013-01-20 MED ORDER — FAMOTIDINE 20 MG PO TABS: 20.0000 mg | ORAL_TABLET | Freq: Every day | ORAL | Status: DC

## 2013-01-20 NOTE — Telephone Encounter (Signed)
Abbott Pao of Dr. Mort Sawyers reply

## 2013-01-20 NOTE — Telephone Encounter (Signed)
Received call from Byrd Hesselbach, RN, with [facility], states she was there at the patient's home doing routine home visit, and states that the left knee felt warm, looked red, and patient's temperature was 99.6. She was concerned. The patient's total knee replacement was 01/16/13. I advised the nurse to have the patient come in today for Dr. Romeo Apple to examine. The patient states his wife was at work and he did not have any means of transportation today. He was given an appointment for Wednesday 01/21/13 at 10:45 am.

## 2013-01-20 NOTE — Telephone Encounter (Signed)
Famotidine 20mg  TAB QTY:30 Take one tablet by mouth once daily

## 2013-01-20 NOTE — Telephone Encounter (Signed)
done

## 2013-01-20 NOTE — Telephone Encounter (Signed)
Medication needs to be filled by PCP.

## 2013-01-20 NOTE — Telephone Encounter (Signed)
Every Friday

## 2013-01-20 NOTE — Telephone Encounter (Signed)
David [name] with [facility] admitted David [name] to home health care and asked when you want her to do a Protime? Her # [phone]

## 2013-01-21 ENCOUNTER — Encounter (INDEPENDENT_AMBULATORY_CARE_PROVIDER_SITE_OTHER): Payer: Self-pay

## 2013-01-21 ENCOUNTER — Ambulatory Visit (INDEPENDENT_AMBULATORY_CARE_PROVIDER_SITE_OTHER): Payer: BC Managed Care – PPO | Admitting: Orthopedic Surgery

## 2013-01-21 VITALS — Ht 71.0 in | Wt 224.0 lb

## 2013-01-21 DIAGNOSIS — Z96659 Presence of unspecified artificial knee joint: Secondary | ICD-10-CM

## 2013-01-21 DIAGNOSIS — Z96652 Presence of left artificial knee joint: Secondary | ICD-10-CM

## 2013-01-21 MED ORDER — OXYCODONE-ACETAMINOPHEN 7.5-325 MG PO TABS: 1.0000 | ORAL_TABLET | ORAL | Status: DC | PRN

## 2013-01-21 NOTE — Progress Notes (Signed)
Patient ID: David [name], male   DOB: 03-07-1961, 52 y.o.   MRN: [id number]  Chief Complaint  Patient presents with  . Follow-up    Post op #1, left knee TKA, DOS 01-16-13.    Care nurse wanted the patient seen for temperature 99.6 and warm knee joint   the knee looks fine to me, he can keep his regular appointment I increased his Percocet 7.5 mg no problems seen

## 2013-01-21 NOTE — Patient Instructions (Signed)
23rd next appt

## 2013-01-22 LAB — TYPE AND SCREEN
ABO/RH(D): O POS
Antibody Screen: NEGATIVE
Unit division: 0
Unit division: 0

## 2013-01-23 ENCOUNTER — Telehealth: Payer: Self-pay | Admitting: *Deleted

## 2013-01-23 ENCOUNTER — Ambulatory Visit (INDEPENDENT_AMBULATORY_CARE_PROVIDER_SITE_OTHER): Payer: BC Managed Care – PPO | Admitting: *Deleted

## 2013-01-23 DIAGNOSIS — Z7901 Long term (current) use of anticoagulants: Secondary | ICD-10-CM

## 2013-01-23 DIAGNOSIS — I4891 Unspecified atrial fibrillation: Secondary | ICD-10-CM

## 2013-01-23 LAB — POCT INR: INR: 1.2

## 2013-01-23 NOTE — Telephone Encounter (Signed)
I returned call to Maralyn Sago, RN, with [facility], and advised her that I had spoken with Dr. Romeo Apple and give him the INR result, and he said that was ok.

## 2013-01-23 NOTE — Telephone Encounter (Signed)
Sarah from [facility] called with an INR result of 1.2 for [name]. She states patient is taking 5 mg of coumadin daily. I advised her that we generally do not dose coumadin for patients, and she agreed that she normally calls [place] with the results, however, her order states to call you. I advised her that you were in surgery all day, but I would send you a phone note, however, I advised her to call [place] with the result also. Her direct phone # is [phone] if you have any further advice.

## 2013-01-26 ENCOUNTER — Telehealth: Payer: Self-pay

## 2013-01-26 ENCOUNTER — Ambulatory Visit (INDEPENDENT_AMBULATORY_CARE_PROVIDER_SITE_OTHER): Payer: BC Managed Care – PPO | Admitting: *Deleted

## 2013-01-26 DIAGNOSIS — Z7901 Long term (current) use of anticoagulants: Secondary | ICD-10-CM

## 2013-01-26 DIAGNOSIS — I4891 Unspecified atrial fibrillation: Secondary | ICD-10-CM

## 2013-01-26 LAB — POCT INR: INR: 2

## 2013-01-26 MED ORDER — AMIODARONE HCL 200 MG PO TABS: 200.0000 mg | ORAL_TABLET | Freq: Every morning | ORAL | Status: DC

## 2013-01-26 NOTE — Telephone Encounter (Signed)
Received fax refill request  Rx # [id number] Medication:  Amiodarone 200 mg tab Qty 90 Sig:  Take one tablet by mouth once daily in the morning Physician:  mcdowell

## 2013-01-26 NOTE — Telephone Encounter (Signed)
Medication sent via escribe.  

## 2013-01-29 ENCOUNTER — Ambulatory Visit (INDEPENDENT_AMBULATORY_CARE_PROVIDER_SITE_OTHER): Payer: BC Managed Care – PPO | Admitting: Orthopedic Surgery

## 2013-01-29 VITALS — BP 134/88 | Ht 71.0 in | Wt 224.0 lb

## 2013-01-29 DIAGNOSIS — M1710 Unilateral primary osteoarthritis, unspecified knee: Secondary | ICD-10-CM

## 2013-01-29 DIAGNOSIS — Z96652 Presence of left artificial knee joint: Secondary | ICD-10-CM | POA: Insufficient documentation

## 2013-01-29 DIAGNOSIS — Z96659 Presence of unspecified artificial knee joint: Secondary | ICD-10-CM

## 2013-01-29 DIAGNOSIS — M179 Osteoarthritis of knee, unspecified: Secondary | ICD-10-CM

## 2013-01-29 MED ORDER — OXYCODONE-ACETAMINOPHEN 5-325 MG PO TABS: 1.0000 | ORAL_TABLET | ORAL | Status: DC

## 2013-01-29 MED ORDER — OXYCODONE-ACETAMINOPHEN 7.5-325 MG PO TABS: 1.0000 | ORAL_TABLET | ORAL | Status: AC | PRN

## 2013-01-29 NOTE — Patient Instructions (Signed)
Start PT   Medication for pain: percocet 7.5/325 for 2 weeks then 5/325 for 2 weeks

## 2013-01-29 NOTE — Progress Notes (Signed)
Patient ID: David [name], male   DOB: [birth year], 52 y.o.   MRN: [id number] Postop visit status post left total knee arthroplasty on October 10 is postop day 13. We last saw [name] for questionable wound problem which turned out to be normal postop wound appearance  The patient is on warfarin  He should have his staples taken out. His staples were removed  His therapy notes indicate that he is ambulating 300 feet.  He does have some transportation issues in terms of getting to therapy and this is concerning. I did discuss this with him regarding the overall long-term result regarding his knee replacement but this is very important  We will check into the [employer] transportation system for him  As far as his pain medication goes he would need a new prescription Percocet 7.5-325 for 2 weeks and Percocet 5 for 2 weeks and then reassess on his next visit which will be postop week #6

## 2013-02-02 ENCOUNTER — Telehealth: Payer: Self-pay | Admitting: Orthopedic Surgery

## 2013-02-02 ENCOUNTER — Ambulatory Visit (INDEPENDENT_AMBULATORY_CARE_PROVIDER_SITE_OTHER): Payer: BC Managed Care – PPO | Admitting: *Deleted

## 2013-02-02 DIAGNOSIS — I4891 Unspecified atrial fibrillation: Secondary | ICD-10-CM

## 2013-02-02 DIAGNOSIS — Z7901 Long term (current) use of anticoagulants: Secondary | ICD-10-CM

## 2013-02-02 LAB — POCT INR: INR: 2.6

## 2013-02-02 NOTE — Telephone Encounter (Signed)
[place] Ouch, nurse with [facility], that he needed to wait until steri strips fell off on there own.

## 2013-02-02 NOTE — Telephone Encounter (Signed)
Call received from [facility] nurse, Maralyn Sago, main ph# [phone], asking when it is okay for patient to get into the shower; states he is wearing steri-strips only now.  Please advise.

## 2013-02-03 ENCOUNTER — Other Ambulatory Visit: Payer: Self-pay | Admitting: *Deleted

## 2013-02-03 DIAGNOSIS — Z96652 Presence of left artificial knee joint: Secondary | ICD-10-CM

## 2013-02-05 ENCOUNTER — Ambulatory Visit (HOSPITAL_COMMUNITY)
Admission: RE | Admit: 2013-02-05 | Discharge: 2013-02-05 | Disposition: A | Discharge status: Home / Self Care | Payer: BC Managed Care – PPO | Source: Ambulatory Visit | Attending: Orthopedic Surgery | Admitting: Orthopedic Surgery

## 2013-02-05 DIAGNOSIS — M25662 Stiffness of left knee, not elsewhere classified: Secondary | ICD-10-CM

## 2013-02-05 DIAGNOSIS — R262 Difficulty in walking, not elsewhere classified: Secondary | ICD-10-CM | POA: Insufficient documentation

## 2013-02-05 DIAGNOSIS — M25569 Pain in unspecified knee: Secondary | ICD-10-CM | POA: Insufficient documentation

## 2013-02-05 DIAGNOSIS — M25669 Stiffness of unspecified knee, not elsewhere classified: Secondary | ICD-10-CM | POA: Insufficient documentation

## 2013-02-05 DIAGNOSIS — IMO0001 Reserved for inherently not codable concepts without codable children: Secondary | ICD-10-CM | POA: Insufficient documentation

## 2013-02-05 NOTE — Evaluation (Signed)
Physical Therapy Evaluation  Patient Details  Name: David [name] MRN: [id number] Date of Birth: 08-25-1960  Today's Date: 02/05/2013 Time: 4098-1191 PT Time Calculation (min): 32 min Charges: 1 evaluation TE: [id number]             Visit#: 1 of 10  Re-eval:   Assessment Diagnosis: Lt TKR Surgical Date: 01/16/13 Next MD Visit: Dr. Romeo Apple - 01/26/13 Prior Therapy: HHPT  Past Medical History:  Past Medical History  Diagnosis Date  . Atrial fibrillation   . Alcohol abuse   . TIA (transient ischemic attack)   . Nonischemic cardiomyopathy     LVEF 20-25% improved to 50% on medical therapy  . Chronic kidney disease, stage 2, mildly decreased GFR   . GERD (gastroesophageal reflux disease)   . Arthritis     Bilateral knees   . Stroke     states ministroke about 06/2011. No deficits  . Gout    Past Surgical History:  Past Surgical History  Procedure Laterality Date  . Arthroscopic knee surgery  1987    right  . Cardioversion  10/08/2011    Procedure: CARDIOVERSION;  Surgeon: Jonelle Sidle, MD;  Location: [facility]S;  Service: Cardiovascular;  Laterality: N/A;  Done in PACU  . Cardioversion  01/10/2012    Procedure: CARDIOVERSION;  Surgeon: Marinus Maw, MD;  Location: [facility] ENDOSCOPY;  Service: Cardiovascular;  Laterality: N/A;  . Total knee arthroplasty Left 01/16/2013    Procedure: TOTAL KNEE ARTHROPLASTY;  Surgeon: Vickki Hearing, MD;  Location: [facility] ORS;  Service: Orthopedics;  Laterality: Left;    Subjective Symptoms/Limitations Pertinent History: Pt is a 52 year old male referred to PT s/p Lt TKR.  His c/co is pain to his knee, difficulty walking independnetly, difficulty standing independently, difficulty with household activiites (including washing dishes, laundry, cleaning house, walking up 2 flights of stairs) Limitations: Standing;Walking;House hold activities How long can you stand comfortably?: 3-4 minutes  How long can you walk comfortably?: 30-45 minutes  with the RW through [employer] Patient Stated Goals: Get back to walking normally.  Pain Assessment Currently in Pain?: Yes Pain Score: 7  Pain Location: Knee Pain Orientation: Left Pain Type: Surgical pain Pain Frequency: Constant Pain Relieving Factors: pain medication and ice  Precautions/Restrictions  Precautions Precaution Comments: A-fib has decreased activity tolerance Restrictions Weight Bearing Restrictions: Yes LLE Weight Bearing: Weight bearing as tolerated  Balance Screening Balance Screen Has the patient fallen in the past 6 months: Yes How many times?: 1 Has the patient had a decrease in activity level because of a fear of falling? : Yes Is the patient reluctant to leave their home because of a fear of falling? : No  Prior Functioning  Prior Function Vocation: Retired Comments: Enjoys walking for exercise.   Sensation/Coordination/Flexibility/Functional Tests Functional Tests Functional Tests: FOTO: 24/76 Functional Tests: 5 sit to stands w/o UE A: 12 seconds  Assessment LLE AROM (degrees) Left Knee Extension: 5 Left Knee Flexion: 100 LLE PROM (degrees) Left Knee Extension: 0 Left Knee Flexion: 108 LLE Strength Left Hip Flexion: 4/5 Left Hip Extension: 4/5 Left Hip ABduction: 5/5 Left Hip ADduction: 5/5 Left Knee Flexion: 3+/5 Left Knee Extension: 4/5 Palpation Palpation: increased pain and tenderness to Lt distal semimembranosus.   Mobility/Balance  Ambulation/Gait Ambulation/Gait Assistance: 6: Modified independent (Device/Increase time) Assistive device: Rolling walker Gait Pattern: Decreased stance time - left;Decreased hip/knee flexion - left    Exercise/Treatments Standing Heel Raises: 5 reps Knee Flexion: Left;5 reps Functional Squat: 5 reps  Seated Long Arc Quad: Left;5 reps Heel Slides: Left;5 reps Supine Quad Sets: Left;5 reps Short Arc Quad Sets: Left;5 reps Straight Leg Raises: Left;5 reps Prone  Hamstring Curl: 5 reps;5  seconds;Limitations Hamstring Curl Limitations: AAROM Hip Extension: Left;5 reps  Physical Therapy Assessment and Plan PT Assessment and Plan Clinical Impression Statement: Pt is a 52 year old male referred to PT s/p Lt TKR secondary to severe knee OA with impairments listed below.  Pt will benefit from skilled therapeutic intervention in order to improve on the following deficits: Abnormal gait;Decreased balance;Decreased coordination;Difficulty walking;Pain;Decreased strength;Decreased range of motion;Decreased activity tolerance;Decreased mobility;Increased fascial restricitons;Impaired perceived functional ability;Impaired flexibility Rehab Potential: Good PT Frequency: Min 3X/week PT Duration: 6 weeks PT Treatment/Interventions: DME instruction;Gait training;Stair training;Functional mobility training;Therapeutic exercise;Patient/family education;Manual techniques;Modalities PT Plan: Continue with bike for AROM only (is limited by a-fib), squats, heel/toe raises, TKE.  mat activities.  Progress to balance activities.  Manual techiques and modalities for pain and edema control     Goals Home Exercise Program Pt/caregiver will Perform Home Exercise Program: For increased ROM;For increased strengthening;With Supervision, verbal cues required/provided PT Goal: Perform Home Exercise Program - Progress: Goal set today PT Short Term Goals Time to Complete Short Term Goals: 2 weeks PT Short Term Goal 1: Pt will present with minimal fascial restrictions in order to report pain to Lt knee less than 4/10 when walking.  PT Short Term Goal 2: Pt will improve his Lt knee AROM: 0-110 degrees for improved gait mechanics.  PT Short Term Goal 3: Pt will improve Lt knee strength in order to ambulate independently with moderate gait impairments PT Long Term Goals Time to Complete Long Term Goals: 4 weeks PT Long Term Goal 1: Pt will improve his Lt knee AROM to [facility] in order to to ambualte independently with  minimal gait impairments.  PT Long Term Goal 2: Pt will improve LLE to [facility] in order to ascend 2 flights of stairs with 1 handrail with alternating pattern to get into his house with greater ease.  Long Term Goal 3: Pt will improve his FOTO to status greater than 50% and limiation less than 50%  Problem List Patient Active Problem List   Diagnosis Date Noted  . Stiffness of left knee 02/05/2013  . Difficulty in walking(719.7) 02/05/2013  . Status post left knee replacement 01/29/2013  . OA (osteoarthritis) of knee 10/28/2012  . Left knee pain 10/28/2012  . Routine general medical examination at a health care facility 09/25/2012  . Guaiac positive stools 09/25/2012  . Other and unspecified hyperlipidemia 09/25/2012  . Encounter for long-term (current) use of anticoagulants 06/21/2011  . Secondary cardiomyopathy, unspecified 06/16/2011  . Atrial fibrillation 06/15/2011  . CKD (chronic kidney disease) stage 2, GFR 60-89 ml/min 06/15/2011    PT - End of Session Activity Tolerance: Patient tolerated treatment well General Behavior During Therapy: WFL for tasks assessed/performed PT Plan of Care PT Home Exercise Plan: given PT Patient Instructions: importance Consulted and Agree with Plan of Care: Patient  GP    Vere Diantonio, MPT, ATC 02/05/2013, 4:20 PM  Physician Documentation Your signature is required to indicate approval of the treatment plan as stated above.  Please sign and either send electronically or make a copy of this report for your files and return this physician signed original.   Please mark one 1.__approve of plan  2. ___approve of plan with the following conditions.   ______________________________  _____________________ Physician Signature                                                                                                             Date

## 2013-02-09 ENCOUNTER — Ambulatory Visit (INDEPENDENT_AMBULATORY_CARE_PROVIDER_SITE_OTHER): Payer: BC Managed Care – PPO | Admitting: *Deleted

## 2013-02-09 ENCOUNTER — Ambulatory Visit (HOSPITAL_COMMUNITY)
Admission: RE | Admit: 2013-02-09 | Discharge: 2013-02-09 | Disposition: A | Discharge status: Home / Self Care | Payer: BC Managed Care – PPO | Source: Ambulatory Visit | Attending: Family Medicine | Admitting: Family Medicine

## 2013-02-09 DIAGNOSIS — G459 Transient cerebral ischemic attack, unspecified: Secondary | ICD-10-CM

## 2013-02-09 DIAGNOSIS — M25669 Stiffness of unspecified knee, not elsewhere classified: Secondary | ICD-10-CM | POA: Insufficient documentation

## 2013-02-09 DIAGNOSIS — I4891 Unspecified atrial fibrillation: Secondary | ICD-10-CM

## 2013-02-09 DIAGNOSIS — IMO0001 Reserved for inherently not codable concepts without codable children: Secondary | ICD-10-CM | POA: Insufficient documentation

## 2013-02-09 DIAGNOSIS — Z7901 Long term (current) use of anticoagulants: Secondary | ICD-10-CM

## 2013-02-09 DIAGNOSIS — M25569 Pain in unspecified knee: Secondary | ICD-10-CM | POA: Insufficient documentation

## 2013-02-09 DIAGNOSIS — M25662 Stiffness of left knee, not elsewhere classified: Secondary | ICD-10-CM

## 2013-02-09 DIAGNOSIS — R262 Difficulty in walking, not elsewhere classified: Secondary | ICD-10-CM

## 2013-02-09 DIAGNOSIS — Z96652 Presence of left artificial knee joint: Secondary | ICD-10-CM

## 2013-02-09 DIAGNOSIS — M25562 Pain in left knee: Secondary | ICD-10-CM

## 2013-02-09 LAB — POCT INR: INR: 2.6

## 2013-02-09 NOTE — Progress Notes (Signed)
Physical Therapy Treatment Patient Details  Name: David [name] MRN: [id number] Date of Birth: 02-01-1961  Today's Date: 02/09/2013 Time: 7829-5621 PT Time Calculation (min): 61 min Charges:  TE: [id number] Manual: [id number] Ice/Estim: 4132-4401 Visit#: 2 of 10  Re-eval:   Assessment Diagnosis: Lt TKR Surgical Date: 01/16/13 Next MD Visit: Dr. Romeo Apple - 01/26/13   Subjective: Symptoms/Limitations Symptoms: Pt states that he did not do much exercising this weekend because he did not feel up to it.  He is walking independently indoors and use a RW outdoors.  He reports that he was cooking yesterday.  Pain Assessment Pain Score: 7  Pain Location: Knee Pain Orientation: Left  Precautions/Restrictions  Precautions Precaution Comments: A-fib has decreased activity tolerance Restrictions Weight Bearing Restrictions: Yes LLE Weight Bearing: Weight bearing as tolerated  Exercise/Treatments Stretches Quad Stretch: 3 reps;30 seconds Aerobic Stationary Bike: For AROM only for knee flexion x7 minutes backwards and forwards Standing Heel Raises: 10 reps;Limitations Heel Raises Limitations: Toe Raises  Knee Flexion: Left;10 reps;Limitations Knee Flexion Limitations: 3# Terminal Knee Extension: Left;10 reps;Limitations Terminal Knee Extension Limitations: 5 sec holds Functional Squat: 10 reps Seated Long Arc Quad: Left;10 reps;Limitations Long Arc Quad Limitations: 3# Supine Quad Sets: Left;20 reps;Limitations Quad Sets Limitations: Mod PT faciliation and VC and TC Short Arc Quad Sets: Left;10 reps;Limitations Short Arc Quad Sets Limitations: 3# 3 sec holds Heel Slides: Left;10 reps Straight Leg Raises: Left;10 reps (w/o extension lag) Knee Flexion: PROM;20 reps (108 degrees) Prone  Hamstring Curl: 10 reps;Limitations Hamstring Curl Limitations: 3# Hip Extension: Left;10 reps;Limitations Hip Extension Limitations: 3#  Modalities Modalities: Electrical  Stimulation;Cryotherapy Cryotherapy Number Minutes Cryotherapy: 20 Minutes Type of Cryotherapy: Ice pack Pharmacologist Location: Lt Musician Parameters: Continous, Frequency: 100 pps, Ramp: 2 sec, 195 volts x20 volts Electrical Stimulation Goals: Pain;Edema  Physical Therapy Assessment and Plan PT Assessment and Plan Clinical Impression Statement: Pt able to complete full knee AROM on the bike today and requires minimal PT faciliation for exercises, except for quadriceps set.  Pt gained knee AROM 3-102 degrees today and PROM to 108 of knee fleixon.  Added High volt estim at end of session to decrease swelling in knee.  PT Treatment/Interventions: DME instruction;Gait training;Stair training;Functional mobility training;Therapeutic exercise;Patient/family education;Manual techniques;Modalities PT Plan: Continue with bike for AROM only (is limited by a-fib),  Progress to balance activities (SLS, stair training)  Manual techiques and modalities for pain and edema control    Goals Home Exercise Program Pt/caregiver will Perform Home Exercise Program: For increased ROM;For increased strengthening;With Supervision, verbal cues required/provided PT Goal: Perform Home Exercise Program - Progress: Progressing toward goal PT Short Term Goals Time to Complete Short Term Goals: 2 weeks PT Short Term Goal 1: Pt will present with minimal fascial restrictions in order to report pain to Lt knee less than 4/10 when walking.  PT Short Term Goal 1 - Progress: Progressing toward goal PT Short Term Goal 2: Pt will improve his Lt knee AROM: 0-110 degrees for improved gait mechanics.  PT Short Term Goal 2 - Progress: Progressing toward goal PT Short Term Goal 3: Pt will improve Lt knee strength in order to ambulate independently with moderate gait impairments PT Short Term Goal 3 - Progress: Progressing toward  goal PT Long Term Goals Time to Complete Long Term Goals: 4 weeks PT Long Term Goal 1: Pt will improve his Lt knee AROM to [facility] in order to to ambualte independently with  minimal gait impairments.  PT Long Term Goal 2: Pt will improve LLE to [facility] in order to ascend 2 flights of stairs with 1 handrail with alternating pattern to get into his house with greater ease.  PT Long Term Goal 2 - Progress: Progressing toward goal Long Term Goal 3: Pt will improve his FOTO to status greater than 50% and limiation less than 50% Long Term Goal 3 Progress: Progressing toward goal  Problem List Patient Active Problem List   Diagnosis Date Noted  . Stiffness of left knee 02/05/2013  . Difficulty in walking(719.7) 02/05/2013  . Status post left knee replacement 01/29/2013  . OA (osteoarthritis) of knee 10/28/2012  . Left knee pain 10/28/2012  . Routine general medical examination at a health care facility 09/25/2012  . Guaiac positive stools 09/25/2012  . Other and unspecified hyperlipidemia 09/25/2012  . Encounter for long-term (current) use of anticoagulants 06/21/2011  . Secondary cardiomyopathy, unspecified 06/16/2011  . Atrial fibrillation 06/15/2011  . CKD (chronic kidney disease) stage 2, GFR 60-89 ml/min 06/15/2011    PT - End of Session Activity Tolerance: Patient tolerated treatment well General Behavior During Therapy: WFL for tasks assessed/performed PT Plan of Care PT Home Exercise Plan: given PT Patient Instructions: importance Consulted and Agree with Plan of Care: Patient  GP    Caylen Yardley, MPT, ATC 02/09/2013, 1:56 PM

## 2013-02-11 ENCOUNTER — Ambulatory Visit (HOSPITAL_COMMUNITY)
Admission: RE | Admit: 2013-02-11 | Discharge: 2013-02-11 | Disposition: A | Discharge status: Home / Self Care | Payer: BC Managed Care – PPO | Source: Ambulatory Visit | Attending: Family Medicine | Admitting: Family Medicine

## 2013-02-11 DIAGNOSIS — R262 Difficulty in walking, not elsewhere classified: Secondary | ICD-10-CM

## 2013-02-11 DIAGNOSIS — Z96652 Presence of left artificial knee joint: Secondary | ICD-10-CM

## 2013-02-11 DIAGNOSIS — M25662 Stiffness of left knee, not elsewhere classified: Secondary | ICD-10-CM

## 2013-02-11 DIAGNOSIS — M25562 Pain in left knee: Secondary | ICD-10-CM

## 2013-02-11 NOTE — Progress Notes (Signed)
Physical Therapy Treatment Patient Details  Name: David [name] MRN: [id number] Date of Birth: [birth year]  Today's Date: 02/11/2013 Time: 8657-8469 PT Time Calculation (min): 68 min Charges: TE: [id number] Manual: [id number] Estim/Ice: 1353-1413 Visit#: 3 of 10  Re-eval:     Subjective: Symptoms/Limitations Symptoms: Pt reports that he was tired after last visit. Pain Assessment Currently in Pain?: Yes Pain Score: 7  Pain Location: Knee Pain Orientation: Left  Precautions/Restrictions     Exercise/Treatments Aerobic Stationary Bike: For AROM seat 10 only for knee flexion x7 minutes backwards and forwards Standing Lateral Step Up: Left;10 reps;Hand Hold: 0;Step Height: 4";Limitations Lateral Step Up Limitations: hip hikes 15 reps w/Pt faciliation Forward Step Up: Left;10 reps;Hand Hold: 0;Step Height: 4" Wall Squat: 10 reps;5 seconds Seated Long Arc Quad: Left;10 reps;Limitations Long Arc Quad Limitations: 4# Prone  Hamstring Curl: 15 reps;Limitations Hamstring Curl Limitations: 4# Hip Extension: Left;10 reps;Limitations Hip Extension Limitations: 4# Prone Knee Hang: Limitations Prone Knee Hang Limitations: 6 minutes  Other Prone Exercises: TKE x10 reps  Modalities Modalities: Electrical Stimulation;Cryotherapy Manual Therapy Manual Therapy: Myofascial release Myofascial Release: using hands and "the stick" to anterior, lateral and posterior thigh and hamstring insertion and iliotibial band to decrease fascial restrictions.  Cryotherapy Number Minutes Cryotherapy: 20 Minutes Electrical Stimulation Electrical Stimulation Location: Lt distal quadricep Electrical Stimulation Action: High Volt Electrical Stimulation Parameters: Continous 40 volts x20 minutes Electrical Stimulation Goals: Pain;Edema  Physical Therapy Assessment and Plan PT Assessment and Plan Clinical Impression Statement: Pt continues to show progress with knee AROM.  Added strengthening  and stretching activities to promote knee extension and improve balance with stair training without UE assist.  Pt does not require PT facilaition to assist with exercises today.    PT Treatment/Interventions: DME instruction;Gait training;Stair training;Functional mobility training;Therapeutic exercise;Patient/family education;Manual techniques;Modalities PT Plan:  Progress to balance activities TKE, gastroc stretch, SLS, stair training.  Manual techiques and modalities for pain and edema control    Goals    Problem List Patient Active Problem List   Diagnosis Date Noted  . Stiffness of left knee 02/05/2013  . Difficulty in walking(719.7) 02/05/2013  . Status post left knee replacement 01/29/2013  . OA (osteoarthritis) of knee 10/28/2012  . Left knee pain 10/28/2012  . Routine general medical examination at a health care facility 09/25/2012  . Guaiac positive stools 09/25/2012  . Other and unspecified hyperlipidemia 09/25/2012  . Encounter for long-term (current) use of anticoagulants 06/21/2011  . Secondary cardiomyopathy, unspecified 06/16/2011  . Atrial fibrillation 06/15/2011  . CKD (chronic kidney disease) stage 2, GFR 60-89 ml/min 06/15/2011    PT - End of Session Activity Tolerance: Patient tolerated treatment well General Behavior During Therapy: WFL for tasks assessed/performed PT Plan of Care PT Home Exercise Plan: given PT Patient Instructions: importance Consulted and Agree with Plan of Care: Patient  GP    Aunica Dauphinee 02/11/2013, 2:11 PM

## 2013-02-13 ENCOUNTER — Ambulatory Visit (HOSPITAL_COMMUNITY)
Admission: RE | Admit: 2013-02-13 | Discharge: 2013-02-13 | Disposition: A | Discharge status: Home / Self Care | Payer: BC Managed Care – PPO | Source: Ambulatory Visit | Attending: Family Medicine | Admitting: Family Medicine

## 2013-02-13 DIAGNOSIS — M25562 Pain in left knee: Secondary | ICD-10-CM

## 2013-02-13 DIAGNOSIS — M25662 Stiffness of left knee, not elsewhere classified: Secondary | ICD-10-CM

## 2013-02-13 DIAGNOSIS — Z96652 Presence of left artificial knee joint: Secondary | ICD-10-CM

## 2013-02-13 DIAGNOSIS — R262 Difficulty in walking, not elsewhere classified: Secondary | ICD-10-CM

## 2013-02-13 NOTE — Progress Notes (Signed)
Physical Therapy Treatment Patient Details  Name: David [name] MRN: [id number] Date of Birth: 18-May-1960  Today's Date: 02/13/2013 Time: 1330 (started by PT (LM))-1445 PT Time Calculation (min): 75 min Charge: TE: 1330-1415; Manual: 1415-1430; Estim/Ice: 1430-1440  Visit#: 4 of 10  Re-eval: 03/05/13   Subjective: Symptoms/Limitations Symptoms: Using his RW when he leaves home. Stiffmness to his knee Pain Assessment Currently in Pain?: Yes Pain Location: Knee Pain Orientation: Left  Objective:   Exercise/Treatments Stretches Gastroc Stretch: 3 reps;30 seconds;Limitations Gastroc Stretch Limitations: slant board Aerobic Stationary Bike: For AROM seat 12 only for knee flexion x10 minutes backwards and forwards Machines for Strengthening Cybex Knee Extension: BLE 3 PL x15 reps Cybex Knee Flexion: BLE 4 PL x15 Standing Terminal Knee Extension: Left;10 reps Lateral Step Up: Left;Hand Hold: 0;Step Height: 4";Limitations;15 reps Forward Step Up: Left;Hand Hold: 0;15 reps;Step Height: 6" Step Down: Left;10 reps;Step Height: 4" Functional Squat: 15 reps;5 seconds Wall Squat: 15 reps;3 seconds SLS: Lt: 8 sec, 11 sec 20 sec; Rt: 25 sec, 30 sec 32 sec Supine Short Arc Quad Sets: Left;20 reps;Limitations Short Arc Quad Sets Limitations: 4# Terminal Knee Extension: AAROM;Left;10 reps;Limitations Terminal Knee Extension Limitations: 5" holds with tactile cueing to improve distal quad contraction and relax glut Straight Leg Raises: Left;10 reps;Other (comment) (w/o extension lag)  Modalities Modalities: Electrical Stimulation;Cryotherapy Manual Therapy Manual Therapy: Myofascial release Myofascial Release: using hands w/MFR and friction massage and "the stick" to anterior, lateral and posterior thigh and hamstring insertion and iliotibial band to decrease fascial restrictions Cryotherapy Number Minutes Cryotherapy: 20 Minutes Cryotherapy Location: Knee Type of Cryotherapy:  Ice pack Pharmacologist Location: Lt TEFL teacher Action: interferential electrical stimulation to distal quadricep Electrical Stimulation Parameters: Continous 80/150 12.5 volts Electrical Stimulation Goals: Pain;Edema  Physical Therapy Assessment and Plan PT Assessment and Plan Clinical Impression Statement: Pt continues to show progress with knee AROM.  Added functional strengthening and stretching activities to improve quad strengthening and increase ease with functional tasks.  Manual techniques complete to reduce fascial restrictions, ended wtih estim and ice for pain and edema control.   PT Plan: Progress ROM, strengthening and balance activtiies and stair training.  Manual techniques and modalities for pain and edema control.      Goals Home Exercise Program Pt/caregiver will Perform Home Exercise Program: For increased ROM;For increased strengthening;With Supervision, verbal cues required/provided PT Short Term Goals Time to Complete Short Term Goals: 2 weeks PT Short Term Goal 1: Pt will present with minimal fascial restrictions in order to report pain to Lt knee less than 4/10 when walking.  PT Short Term Goal 1 - Progress: Progressing toward goal PT Short Term Goal 2: Pt will improve his Lt knee AROM: 0-110 degrees for improved gait mechanics.  PT Short Term Goal 2 - Progress: Progressing toward goal PT Short Term Goal 3: Pt will improve Lt knee strength in order to ambulate independently with moderate gait impairments PT Short Term Goal 3 - Progress: Progressing toward goal PT Long Term Goals Time to Complete Long Term Goals: 4 weeks PT Long Term Goal 1: Pt will improve his Lt knee AROM to [facility] in order to to ambualte independently with minimal gait impairments.  PT Long Term Goal 2: Pt will improve LLE to [facility] in order to ascend 2 flights of stairs with 1 handrail with alternating pattern to get into his house with  greater ease.  Long Term Goal 3: Pt will improve his FOTO to status greater  than 50% and limiation less than 50%  Problem List Patient Active Problem List   Diagnosis Date Noted  . Stiffness of left knee 02/05/2013  . Difficulty in walking(719.7) 02/05/2013  . Status post left knee replacement 01/29/2013  . OA (osteoarthritis) of knee 10/28/2012  . Left knee pain 10/28/2012  . Routine general medical examination at a health care facility 09/25/2012  . Guaiac positive stools 09/25/2012  . Other and unspecified hyperlipidemia 09/25/2012  . Encounter for long-term (current) use of anticoagulants 06/21/2011  . Secondary cardiomyopathy, unspecified 06/16/2011  . Atrial fibrillation 06/15/2011  . CKD (chronic kidney disease) stage 2, GFR 60-89 ml/min 06/15/2011    PT - End of Session Activity Tolerance: Patient tolerated treatment well General Behavior During Therapy: [facility] for tasks assessed/performed  GP    Juel Burrow 02/13/2013, 3:01 PM

## 2013-02-16 ENCOUNTER — Ambulatory Visit (HOSPITAL_COMMUNITY)
Admission: RE | Admit: 2013-02-16 | Discharge: 2013-02-16 | Disposition: A | Discharge status: Home / Self Care | Payer: BC Managed Care – PPO | Source: Ambulatory Visit | Attending: Family Medicine | Admitting: Family Medicine

## 2013-02-16 DIAGNOSIS — M25662 Stiffness of left knee, not elsewhere classified: Secondary | ICD-10-CM

## 2013-02-16 DIAGNOSIS — R262 Difficulty in walking, not elsewhere classified: Secondary | ICD-10-CM

## 2013-02-16 DIAGNOSIS — M25562 Pain in left knee: Secondary | ICD-10-CM

## 2013-02-16 DIAGNOSIS — Z96652 Presence of left artificial knee joint: Secondary | ICD-10-CM

## 2013-02-16 NOTE — Progress Notes (Signed)
Physical Therapy Treatment Patient Details  Name: David [name] MRN: [id number] Date of Birth: 02-Aug-1960  Today's Date: 02/16/2013 Time: 1100-1215 PT Time Calculation (min): 75 min Charge: [id number], Gait 1140-1150, Manual 1150-1200, Estim and ice 1202-1212  Visit#: 5 of 10  Re-eval: 03/05/13 Assessment Diagnosis: Lt TKR Surgical Date: 01/16/13 Next MD Visit: Dr. Romeo Apple - 02/26/13 Prior Therapy: HHPT  Authorization:    Authorization Time Period:    Authorization Visit#:   of     Subjective: Symptoms/Limitations Symptoms: Pt entered dept with SPC, stated he stopped using RW last Friday.  Lt knee is stif today. Pain Assessment Currently in Pain?: Yes Pain Score: 7  Pain Location: Knee Pain Orientation: Left  Precautions/Restrictions  Precautions Precaution Comments: A-fib has decreased activity tolerance Restrictions Weight Bearing Restrictions: Yes LLE Weight Bearing: Weight bearing as tolerated  Exercise/Treatments Stretches Gastroc Stretch: 3 reps;30 seconds;Limitations Gastroc Stretch Limitations: slant board Aerobic Stationary Bike: For AROM seat 12 only for knee flexion x10 minutes backwards and forwards Standing Heel Raises: 15 reps;Limitations Heel Raises Limitations: Toe Raises  Terminal Knee Extension: Left;15 reps;Theraband Theraband Level (Terminal Knee Extension): Level 4 (Blue) Terminal Knee Extension Limitations: 5 sec holds Lateral Step Up: Left;Hand Hold: 0;Step Height: 4";Limitations;15 reps Lateral Step Up Limitations: hip hikes 15 reps w/Pt faciliation Forward Step Up: Left;Hand Hold: 0;15 reps;Step Height: 6" Step Down: Left;10 reps;Step Height: 4" Functional Squat: 15 reps;5 seconds Rocker Board: 2 minutes;Limitations Rocker Board Limitations: R/L and A/P Gait Training: Gait training with SPC to improve sequening and gait mechanics Supine Quad Sets: Left;20 reps;Limitations Quad Sets Limitations: PT faciliation Short Arc Quad  Sets: Left;20 reps;Limitations Short Arc Quad Sets Limitations: 4#   Modalities Modalities: Electrical Stimulation;Cryotherapy Manual Therapy Manual Therapy: Myofascial release Myofascial Release: using hands w/MFR and friction massage and "the stick" to anterior, lateral and posterior thigh and hamstring insertion and iliotibial band to decrease fascial restrictions Cryotherapy Number Minutes Cryotherapy: 10 Minutes Cryotherapy Location: Knee Type of Cryotherapy: Ice pack Pharmacologist Location: Lt TEFL teacher Action: interferential electrical stimulation to distal quadricep Electrical Stimulation Parameters: Continous 80/150 12.5 volts Electrical Stimulation Goals: Pain;Edema  Physical Therapy Assessment and Plan PT Assessment and Plan Clinical Impression Statement: Session focus on improving AROM, functional strengthening and improving gait mechanics with LRAD.  Pt instructued proper sequencing with SPC and multimodal cueing to improve heel to toe pattern, equal stance phase and stride length.  Pt does continue to have moderate amount of fascial restrictions to proximal knee incision, manual techniques were complete to reduce restrictions.  Ended session with estim and ice for pain control.   PT Plan: Continue with manual techniques to proximal insicion, trial with continiuous 1.0-1.2 [place] for pain and to increase flexibility for ROM.  Continue progress ROM, strengthening, gait, stair training and balance activties.      Goals Home Exercise Program Pt/caregiver will Perform Home Exercise Program: For increased ROM;For increased strengthening;With Supervision, verbal cues required/provided PT Short Term Goals Time to Complete Short Term Goals: 2 weeks PT Short Term Goal 1: Pt will present with minimal fascial restrictions in order to report pain to Lt knee less than 4/10 when walking.  PT Short Term Goal 1 - Progress:  Progressing toward goal PT Short Term Goal 2: Pt will improve his Lt knee AROM: 0-110 degrees for improved gait mechanics.  PT Short Term Goal 2 - Progress: Progressing toward goal PT Short Term Goal 3: Pt will improve Lt knee strength in order to  ambulate independently with moderate gait impairments PT Short Term Goal 3 - Progress: Progressing toward goal PT Long Term Goals Time to Complete Long Term Goals: 4 weeks PT Long Term Goal 1: Pt will improve his Lt knee AROM to [facility] in order to to ambualte independently with minimal gait impairments.  PT Long Term Goal 1 - Progress: Progressing toward goal PT Long Term Goal 2: Pt will improve LLE to [facility] in order to ascend 2 flights of stairs with 1 handrail with alternating pattern to get into his house with greater ease.  PT Long Term Goal 2 - Progress: Progressing toward goal Long Term Goal 3: Pt will improve his FOTO to status greater than 50% and limiation less than 50%  Problem List Patient Active Problem List   Diagnosis Date Noted  . Stiffness of left knee 02/05/2013  . Difficulty in walking(719.7) 02/05/2013  . Status post left knee replacement 01/29/2013  . OA (osteoarthritis) of knee 10/28/2012  . Left knee pain 10/28/2012  . Routine general medical examination at a health care facility 09/25/2012  . Guaiac positive stools 09/25/2012  . Other and unspecified hyperlipidemia 09/25/2012  . Encounter for long-term (current) use of anticoagulants 06/21/2011  . Secondary cardiomyopathy, unspecified 06/16/2011  . Atrial fibrillation 06/15/2011  . CKD (chronic kidney disease) stage 2, GFR 60-89 ml/min 06/15/2011    PT - End of Session Activity Tolerance: Patient tolerated treatment well General Behavior During Therapy: [facility] for tasks assessed/performed  GP    Juel Burrow 02/16/2013, 12:21 PM

## 2013-02-18 ENCOUNTER — Ambulatory Visit (HOSPITAL_COMMUNITY)
Admission: RE | Admit: 2013-02-18 | Discharge: 2013-02-18 | Disposition: A | Discharge status: Home / Self Care | Payer: BC Managed Care – PPO | Source: Ambulatory Visit | Attending: Family Medicine | Admitting: Family Medicine

## 2013-02-18 DIAGNOSIS — M25662 Stiffness of left knee, not elsewhere classified: Secondary | ICD-10-CM

## 2013-02-18 DIAGNOSIS — R262 Difficulty in walking, not elsewhere classified: Secondary | ICD-10-CM

## 2013-02-18 DIAGNOSIS — Z96652 Presence of left artificial knee joint: Secondary | ICD-10-CM

## 2013-02-18 DIAGNOSIS — M25562 Pain in left knee: Secondary | ICD-10-CM

## 2013-02-18 NOTE — Progress Notes (Signed)
Physical Therapy Treatment Patient Details  Name: David [name] MRN: [id number] Date of Birth: [birth year]  Today's Date: 02/18/2013 Time: 4098-1191 PT Time Calculation (min): 51 min Charge: [place] 1302-1310, Manual 1310-1325, TE 1325-1345, Gait 1345-1353  Visit#: 6 of 10  Re-eval: 03/05/13 Assessment Diagnosis: Lt TKR Surgical Date: 01/16/13 Next MD Visit: Dr. Romeo Apple - 02/26/13 Prior Therapy: HHPT  Subjective: Symptoms/Limitations Symptoms: Pt stated he forget cane at home today.  Lt knee pain scale continues to stay around 7/10. Pain Assessment Currently in Pain?: Yes Pain Score: 7  Pain Location: Knee Pain Orientation: Left  Precautions/Restrictions  Precautions Precaution Comments: A-fib has decreased activity tolerance  Exercise/Treatments Standing Gait Training: Gait training with no AD to improve mechanics, visual cueing 4 RT down carpet hallway Supine Quad Sets: Left;20 reps;Limitations Short Arc Quad Sets: Left;20 reps;Limitations Short Arc Quad Sets Limitations: 4# Terminal Knee Extension: AROM;Left;15 reps;Limitations Terminal Knee Extension Limitations: 5" holds Prone  Hamstring Curl: 15 reps;Limitations Hamstring Curl Limitations: 4# Hip Extension: Left;15 reps Other Prone Exercises:     Modalities Modalities: Ultrasound Manual Therapy Manual Therapy: Myofascial release Myofascial Release: MFR to incision to reduce fascial restricitons and scar tissue Ultrasound Ultrasound Location: proximal Lt knee incision and joint lines Ultrasound Parameters: 1.2w/cm2 x 8 min thermal Ultrasound Goals: Pain (ROM and to decrease adhesions)  Physical Therapy Assessment and Plan PT Assessment and Plan Clinical Impression Statement: Began tx with thermal [place] with knee flexed across proximal incision and joint line to reduce adhesions, increase ROM and reduce pain.  Continued with manual to reduce fascial restrictions to proximal knee incision.  Gait  training without AD with verbal and visual cueing with improved gait mechanics noted.  Pt with improved quadricps coordination noted with therex this session.   PT Plan: F/U with [place].  Continue with manual techniquesm therex for flexibility for ROM, strengthening and gait training.  Progress to balance activities    Goals Home Exercise Program Pt/caregiver will Perform Home Exercise Program: For increased ROM;For increased strengthening;With Supervision, verbal cues required/provided PT Short Term Goals Time to Complete Short Term Goals: 2 weeks PT Short Term Goal 1: Pt will present with minimal fascial restrictions in order to report pain to Lt knee less than 4/10 when walking.  PT Short Term Goal 1 - Progress: Progressing toward goal PT Short Term Goal 2: Pt will improve his Lt knee AROM: 0-110 degrees for improved gait mechanics.  PT Short Term Goal 2 - Progress: Progressing toward goal PT Short Term Goal 3: Pt will improve Lt knee strength in order to ambulate independently with moderate gait impairments PT Short Term Goal 3 - Progress: Progressing toward goal PT Long Term Goals Time to Complete Long Term Goals: 4 weeks PT Long Term Goal 1: Pt will improve his Lt knee AROM to [facility] in order to to ambualte independently with minimal gait impairments.  PT Long Term Goal 1 - Progress: Progressing toward goal PT Long Term Goal 2: Pt will improve LLE to [facility] in order to ascend 2 flights of stairs with 1 handrail with alternating pattern to get into his house with greater ease.  Long Term Goal 3: Pt will improve his FOTO to status greater than 50% and limiation less than 50%  Problem List Patient Active Problem List   Diagnosis Date Noted  . Stiffness of left knee 02/05/2013  . Difficulty in walking(719.7) 02/05/2013  . Status post left knee replacement 01/29/2013  . OA (osteoarthritis) of knee 10/28/2012  . Left  knee pain 10/28/2012  . Routine general medical examination at a health care  facility 09/25/2012  . Guaiac positive stools 09/25/2012  . Other and unspecified hyperlipidemia 09/25/2012  . Encounter for long-term (current) use of anticoagulants 06/21/2011  . Secondary cardiomyopathy, unspecified 06/16/2011  . Atrial fibrillation 06/15/2011  . CKD (chronic kidney disease) stage 2, GFR 60-89 ml/min 06/15/2011    PT - End of Session Activity Tolerance: Patient tolerated treatment well General Behavior During Therapy: [facility] for tasks assessed/performed  GP    Juel Burrow 02/18/2013, 2:22 PM

## 2013-02-20 ENCOUNTER — Ambulatory Visit (HOSPITAL_COMMUNITY)
Admission: RE | Admit: 2013-02-20 | Discharge: 2013-02-20 | Disposition: A | Discharge status: Home / Self Care | Payer: BC Managed Care – PPO | Source: Ambulatory Visit | Attending: Family Medicine | Admitting: Family Medicine

## 2013-02-20 DIAGNOSIS — Z96652 Presence of left artificial knee joint: Secondary | ICD-10-CM

## 2013-02-20 DIAGNOSIS — M25562 Pain in left knee: Secondary | ICD-10-CM

## 2013-02-20 DIAGNOSIS — M25662 Stiffness of left knee, not elsewhere classified: Secondary | ICD-10-CM

## 2013-02-20 DIAGNOSIS — R262 Difficulty in walking, not elsewhere classified: Secondary | ICD-10-CM

## 2013-02-20 NOTE — Progress Notes (Signed)
Physical Therapy Treatment Patient Details  Name: David [name] MRN: [id number] Date of Birth: [birth year]  Today's Date: 02/20/2013 Time: 6213-0865 PT Time Calculation (min): 53 min Charge: [place] [id number], Manual 1312-1325, TE [id number], Gait training [phone]  Visit#: 7 of 10  Re-eval: 03/05/13 Assessment Diagnosis: Lt TKR Surgical Date: 01/16/13 Next MD Visit: Dr. Romeo Apple - 02/26/13 Prior Therapy: HHPT  Subjective: Symptoms/Limitations Symptoms: Pt stated he went to [facility] and walked without cane.  Pt reported he liked results with [place] last session, Current pain scale 6/10. Pain Assessment Currently in Pain?: Yes Pain Score: 6  Pain Location: Knee Pain Orientation: Left  Precautions/Restrictions  Precautions Precaution Comments: A-fib has decreased activity tolerance  Exercise/Treatments Aerobic Tread Mill: Gait training on TM 1.5 with mirror in front for visual cueing Machines for Strengthening Cybex Knee Extension: Lt LE only 15x focus on eccentric control Standing Step Down: Left;2 sets;10 reps;Hand Hold: 1;Step Height: 4";Step Height: 6" Stairs: 2RT reciprocal pattern with 1 HR Gait Training: Gait training with no AD to improve mechanics, visual cueing   bSupine Quad Sets: Left;20 reps;Limitations Quad Sets Limitations: 10" holds Short Arc [employer] Sets: Left;20 reps;Limitations Short Arc Quad Sets Limitations: 4# Terminal Knee Extension: AROM;Left;15 reps;Limitations Terminal Knee Extension Limitations: 10" holds Prone  Hamstring Curl: 20 reps;Limitations Hamstring Curl Limitations: 4# Hip Extension: Left;15 reps Hip Extension Limitations: 4#   Modalities Modalities: Ultrasound Manual Therapy Manual Therapy: Myofascial release Myofascial Release: MFR to incision to reduce fascial restricitons and scar tissue Ultrasound Ultrasound Location: proximal Lt knee incision and joint lines Ultrasound Parameters: 1.2w/cm2 x 8 min thermal Ultrasound  Goals: Pain  Physical Therapy Assessment and Plan PT Assessment and Plan Clinical Impression Statement: Continued with thermal [place] with knee flexed across proximal incison and joint line to reduce adhesions, increase ROM and reduce pain.  Gait training complete to improve gait mechanics with cueing for knee extension wtih heel to toe pattern and equal stride length/stance phase, began stairwell training with noted weak quadriceps eccentric control. Progressed  cybex quad strengthening with Lt LE only with focus on eccentric control  and began gait trining on TM with visual cueing to improve mechanics.   PT Plan: Continue with manual techniquesm therex for flexibility for ROM, strengthening and gait training.  Progress to balance activities    Goals Home Exercise Program Pt/caregiver will Perform Home Exercise Program: For increased ROM;For increased strengthening;With Supervision, verbal cues required/provided PT Short Term Goals Time to Complete Short Term Goals: 2 weeks PT Short Term Goal 1: Pt will present with minimal fascial restrictions in order to report pain to Lt knee less than 4/10 when walking.  PT Short Term Goal 1 - Progress: Progressing toward goal PT Short Term Goal 2: Pt will improve his Lt knee AROM: 0-110 degrees for improved gait mechanics.  PT Short Term Goal 2 - Progress: Progressing toward goal PT Short Term Goal 3: Pt will improve Lt knee strength in order to ambulate independently with moderate gait impairments PT Short Term Goal 3 - Progress: Progressing toward goal PT Long Term Goals Time to Complete Long Term Goals: 4 weeks PT Long Term Goal 1: Pt will improve his Lt knee AROM to [facility] in order to to ambualte independently with minimal gait impairments.  PT Long Term Goal 1 - Progress: Progressing toward goal PT Long Term Goal 2: Pt will improve LLE to [facility] in order to ascend 2 flights of stairs with 1 handrail with alternating pattern to get into his house  with greater  ease.  PT Long Term Goal 2 - Progress: Progressing toward goal Long Term Goal 3: Pt will improve his FOTO to status greater than 50% and limiation less than 50%  Problem List Patient Active Problem List   Diagnosis Date Noted  . Stiffness of left knee 02/05/2013  . Difficulty in walking(719.7) 02/05/2013  . Status post left knee replacement 01/29/2013  . OA (osteoarthritis) of knee 10/28/2012  . Left knee pain 10/28/2012  . Routine general medical examination at a health care facility 09/25/2012  . Guaiac positive stools 09/25/2012  . Other and unspecified hyperlipidemia 09/25/2012  . Encounter for long-term (current) use of anticoagulants 06/21/2011  . Secondary cardiomyopathy, unspecified 06/16/2011  . Atrial fibrillation 06/15/2011  . CKD (chronic kidney disease) stage 2, GFR 60-89 ml/min 06/15/2011    PT - End of Session Activity Tolerance: Patient tolerated treatment well General Behavior During Therapy: [facility] for tasks assessed/performed  GP    Juel Burrow 02/20/2013, 4:47 PM

## 2013-02-24 ENCOUNTER — Ambulatory Visit (HOSPITAL_COMMUNITY)
Admission: RE | Admit: 2013-02-24 | Discharge: 2013-02-24 | Disposition: A | Discharge status: Home / Self Care | Payer: BC Managed Care – PPO | Source: Ambulatory Visit | Attending: Family Medicine | Admitting: Family Medicine

## 2013-02-24 NOTE — Progress Notes (Signed)
Physical Therapy Treatment Patient Details  Name: David [name] MRN: [id number] Date of Birth: May 19, 1960  Today's Date: 02/24/2013 Time: 1350-1445 PT Time Calculation (min): 55 min Visit#: 8 of 10  Re-eval: 03/05/13 Charges:  [place] 1350-1358 (8'), manual 1400-1420 (20') therex 1421-1431 (10'), gait 1432-1440 (8')  Subjective: Symptoms/Limitations Symptoms: Pain reported as 7/10 today thinks it may be due to colder weather.   Pain Assessment Currently in Pain?: Yes Pain Score: 7  Pain Location: Knee Pain Orientation: Left    Exercise/Treatments Aerobic Tread Mill: Gait training 7 minutes on TM 1.5 with mirror in front for visual cueing Machines for Strengthening Cybex Knee Extension: Lt LE only 3 Pl 15x focus on eccentric control Supine Straight Leg Raises: Left;10 reps;Other (comment);Limitations Straight Leg Raises Limitations: 4# Prone  Hamstring Curl: 20 reps Hamstring Curl Limitations: 4# Hip Extension: 20 reps Hip Extension Limitations: 4#   Modalities Modalities: Ultrasound Manual Therapy Manual Therapy: Myofascial release Myofascial Release: MFR to perimeter of knee to reduce fascial restrictions and scar tissue following [place] Ultrasound Ultrasound Location: Lateral and medial  LT knee, 4' each (8' total) Ultrasound Parameters: 1.2 w/cm2 continuous with  8' total Ultrasound Goals: Pain;Other (Comment)  Physical Therapy Assessment and Plan PT Assessment and Plan Clinical Impression Statement: Pt with moderate amounts of scar tissue/ adhesions perimeter of knee.  Focused majority of time on manual techniques to decrease scar and adhesions.  Tight bands of tissues palpable with audible pops afterward with active flexion.  Pt unable to complte greater than 7 minutes amublation today due to increase in knee pain. PT Plan: Continue with manual techniques and  therex for flexibility for ROM, strengthening and gait training.  Progress to balance activities      Problem List Patient Active Problem List   Diagnosis Date Noted  . Stiffness of left knee 02/05/2013  . Difficulty in walking(719.7) 02/05/2013  . Status post left knee replacement 01/29/2013  . OA (osteoarthritis) of knee 10/28/2012  . Left knee pain 10/28/2012  . Routine general medical examination at a health care facility 09/25/2012  . Guaiac positive stools 09/25/2012  . Other and unspecified hyperlipidemia 09/25/2012  . Encounter for long-term (current) use of anticoagulants 06/21/2011  . Secondary cardiomyopathy, unspecified 06/16/2011  . Atrial fibrillation 06/15/2011  . CKD (chronic kidney disease) stage 2, GFR 60-89 ml/min 06/15/2011    PT - End of Session Activity Tolerance: Patient tolerated treatment well General Behavior During Therapy: [facility] for tasks assessed/performed   Lurena Nida, PTA/CLT 02/24/2013, 2:48 PM

## 2013-02-26 ENCOUNTER — Ambulatory Visit (INDEPENDENT_AMBULATORY_CARE_PROVIDER_SITE_OTHER): Payer: BC Managed Care – PPO | Admitting: *Deleted

## 2013-02-26 ENCOUNTER — Ambulatory Visit (INDEPENDENT_AMBULATORY_CARE_PROVIDER_SITE_OTHER): Payer: BC Managed Care – PPO | Admitting: Orthopedic Surgery

## 2013-02-26 ENCOUNTER — Ambulatory Visit (HOSPITAL_COMMUNITY)
Admission: RE | Admit: 2013-02-26 | Discharge: 2013-02-26 | Disposition: A | Discharge status: Home / Self Care | Payer: BC Managed Care – PPO | Source: Ambulatory Visit | Attending: Family Medicine | Admitting: Family Medicine

## 2013-02-26 VITALS — BP 148/90 | Ht 71.0 in | Wt 224.0 lb

## 2013-02-26 DIAGNOSIS — M25562 Pain in left knee: Secondary | ICD-10-CM

## 2013-02-26 DIAGNOSIS — Z96652 Presence of left artificial knee joint: Secondary | ICD-10-CM

## 2013-02-26 DIAGNOSIS — R262 Difficulty in walking, not elsewhere classified: Secondary | ICD-10-CM

## 2013-02-26 DIAGNOSIS — G459 Transient cerebral ischemic attack, unspecified: Secondary | ICD-10-CM

## 2013-02-26 DIAGNOSIS — Z96659 Presence of unspecified artificial knee joint: Secondary | ICD-10-CM

## 2013-02-26 DIAGNOSIS — M1710 Unilateral primary osteoarthritis, unspecified knee: Secondary | ICD-10-CM

## 2013-02-26 DIAGNOSIS — Z7901 Long term (current) use of anticoagulants: Secondary | ICD-10-CM

## 2013-02-26 DIAGNOSIS — M25662 Stiffness of left knee, not elsewhere classified: Secondary | ICD-10-CM

## 2013-02-26 DIAGNOSIS — IMO0002 Reserved for concepts with insufficient information to code with codable children: Secondary | ICD-10-CM

## 2013-02-26 DIAGNOSIS — I4891 Unspecified atrial fibrillation: Secondary | ICD-10-CM

## 2013-02-26 LAB — POCT INR: INR: 2.4

## 2013-02-26 MED ORDER — OXYCODONE-ACETAMINOPHEN 5-325 MG PO TABS: 1.0000 | ORAL_TABLET | ORAL | Status: DC

## 2013-02-26 NOTE — Progress Notes (Signed)
Patient ID: David [name], male   DOB: [birth year], 52 y.o.   MRN: [id number]  Chief Complaint  Patient presents with  . Follow-up    4 week / 6 week postop recheck left TKA DOS 01/16/13    Six-week postop visit left total knee doing well knee flexion now to 110 passive 105 active small amount of extensor lag noted.  Wound looks clean no swelling around the ankle  Recommend continued physical therapy and return in 6 weeks

## 2013-02-26 NOTE — Progress Notes (Signed)
Physical Therapy Treatment Patient Details  Name: David [name] MRN: [id number] Date of Birth: [birth year]  Today's Date: 02/26/2013 Time: 7829-5621 PT Time Calculation (min): 49 min Charge: [place] 3086-5784, Manual 1414-1435, Gait 1435-1443, TE 6962-9528  Visit#: 9 of 10  Re-eval: 03/05/13 Assessment Diagnosis: Lt TKR Surgical Date: 01/16/13 Next MD Visit: Dr. Romeo Apple - 04/07/2013 Prior Therapy: [facility]  Authorization:    Authorization Time Period:    Authorization Visit#:   of     Subjective: Symptoms/Limitations Symptoms: MD apt earlier, reports doctor happy with  progress, pt reports walking to therapy today.  Pain stays 7/10 Pain Assessment Currently in Pain?: Yes Pain Score: 7  Pain Location: Knee Pain Orientation: Left  Precautions/Restrictions  Precautions Precaution Comments: A-fib has decreased activity tolerance  Exercise/Treatments Aerobic Stationary Bike: For AROM seat 11 x 8 minutes @ 4.0 for strengthening Machines for Strengthening Cybex Knee Extension: Lt LE only 3 Pl 15x focus on eccentric control Cybex Knee Flexion: BLE 4 PL x15 Standing Gait Training: Gait training 3RT  Supine Quad Sets: Left;20 reps;Limitations Quad Sets Limitations: 10" holds Heel Slides: Left;10 reps;Limitations   Modalities Modalities: Ultrasound Manual Therapy Manual Therapy: Myofascial release Myofascial Release: MFR to perimeter of knee to reduce fascial restrictions and scar tissue following [place] Ultrasound Ultrasound Location: Lt knee proximal incision point and joint iines lateral and medial Ultrasound Parameters: 1.2 w/cm2 continuous with 8' total  Ultrasound Goals: Pain;Other (Comment) (ROM and reduce adhesions)  Physical Therapy Assessment and Plan PT Assessment and Plan Clinical Impression Statement: Significant reduction of fascial restrictions noted following manual this session. Pt with improved gait mechanics this session, minimal cueing required to  improve gait mechanics.  Therex focus on improving AROM.  Pt reported pain reduced at end of session.   PT Plan: Continue with manual techniques and  therex for flexibility for ROM, strengthening and gait training.  Progress to balance activities    Goals PT Short Term Goals PT Short Term Goal 1 - Progress: Progressing toward goal PT Short Term Goal 2 - Progress: Progressing toward goal PT Short Term Goal 3 - Progress: Progressing toward goal PT Long Term Goals PT Long Term Goal 1 - Progress: Progressing toward goal  Problem List Patient Active Problem List   Diagnosis Date Noted  . Stiffness of left knee 02/05/2013  . Difficulty in walking(719.7) 02/05/2013  . Status post left knee replacement 01/29/2013  . OA (osteoarthritis) of knee 10/28/2012  . Left knee pain 10/28/2012  . Routine general medical examination at a health care facility 09/25/2012  . Guaiac positive stools 09/25/2012  . Other and unspecified hyperlipidemia 09/25/2012  . Encounter for long-term (current) use of anticoagulants 06/21/2011  . Secondary cardiomyopathy, unspecified 06/16/2011  . Atrial fibrillation 06/15/2011  . CKD (chronic kidney disease) stage 2, GFR 60-89 ml/min 06/15/2011    PT - End of Session Activity Tolerance: Patient tolerated treatment well General Behavior During Therapy: [facility] for tasks assessed/performed  GP    Juel Burrow 02/26/2013, 3:52 PM

## 2013-03-02 ENCOUNTER — Ambulatory Visit (HOSPITAL_COMMUNITY): Payer: BC Managed Care – PPO

## 2013-03-02 ENCOUNTER — Telehealth (HOSPITAL_COMMUNITY): Payer: Self-pay

## 2013-03-04 ENCOUNTER — Ambulatory Visit (HOSPITAL_COMMUNITY)
Admission: RE | Admit: 2013-03-04 | Discharge: 2013-03-04 | Disposition: A | Discharge status: Home / Self Care | Payer: BC Managed Care – PPO | Source: Ambulatory Visit | Attending: Family Medicine | Admitting: Family Medicine

## 2013-03-04 DIAGNOSIS — M25662 Stiffness of left knee, not elsewhere classified: Secondary | ICD-10-CM

## 2013-03-04 DIAGNOSIS — Z96652 Presence of left artificial knee joint: Secondary | ICD-10-CM

## 2013-03-04 DIAGNOSIS — M25562 Pain in left knee: Secondary | ICD-10-CM

## 2013-03-04 DIAGNOSIS — R262 Difficulty in walking, not elsewhere classified: Secondary | ICD-10-CM

## 2013-03-04 NOTE — Evaluation (Signed)
Physical Therapy Re-Evaluation  Patient Details  Name: ASHAD FAWBUSH MRN: [id number] Date of Birth: 10/23/1960  Today's Date: 03/04/2013 Time: 7829-5621 PT Time Calculation (min): 53 min Charges:  1MMT/ROM TE: 3086-5784 Ice: 1             Visit#: 10 of 18  Re-eval: 03/05/13    Authorization:      Authorization Time Period:    Authorization Visit#:   of     Subjective Symptoms/Limitations Symptoms: Pt reports that he is stiff today.  he has been walking independently.   Cognition/Observation Observation/Other Assessments Observations: heel walking with lumbar flexion  Sensation/Coordination/Flexibility/Functional Tests Functional Tests Functional Tests: FOTO: Status56% Limiation: 44% (was 24/76)  Assessment LLE AROM (degrees) Left Knee Extension: 0 (was 5) Left Knee Flexion: 115 (was 100) LLE PROM (degrees) Left Knee Extension: 0 (as 1) Left Knee Flexion: 118 (was 108) LLE Strength Left Hip Flexion: 5/5 (was 4/5) Left Hip Extension: 5/5 (was 4/5) Left Hip ABduction: 5/5 (was 5/5) Left Hip ADduction: 5/5 (was 5/5) Left Knee Flexion: 5/5 (was 3+/5) Left Knee Extension: 5/5 (was 4/5)  Mobility/Balance  Ambulation/Gait Ambulation/Gait Assistance: 7: Independent Gait Pattern: Decreased stance time - left;Decreased hip/knee flexion - left   Exercise/Treatments Aerobic Elliptical: NuStep: 10 minutes, Hills 2, resistance 4 Machines for Strengthening Cybex Knee Extension: Lt LE only 3Pl 2x10 Cybex Knee Flexion: BLE 6 PL 2x10 Standing Heel Raises: 15 reps;Limitations Heel Raises Limitations: LLE only Terminal Knee Extension: Left;10 reps;Theraband Theraband Level (Terminal Knee Extension): Level 4 (Blue) Terminal Knee Extension Limitations: 10 sec holds Other Standing Knee Exercises: Heel walking 2 RT, Toe walking 2 RT  Cryotherapy Number Minutes Cryotherapy: 10 Minutes Type of Cryotherapy: Ice pack  Physical Therapy Assessment and Plan PT Assessment  and Plan Clinical Impression Statement: Mr. Broadfoot has attended 10 OP PT visits s/p L TKR with following findings: continues to have greatest limitation with stiffness to his knee, ambulating independently, has improved his functional strength and AROM, continues to have significant gait impairments and require max cueing.  Pt will benefit from skilled therapeutic intervention in order to improve on the following deficits: Abnormal gait;Pain;Impaired perceived functional ability Rehab Potential: Good PT Frequency: Min 2X/week PT Duration: 4 weeks PT Treatment/Interventions: DME instruction;Gait training;Stair training;Functional mobility training;Therapeutic exercise;Patient/family education;Manual techniques;Modalities PT Plan: Continued to improve gait mechanics (may add elliptical for proper motion), stair training, improvement in gait mechanics.     Goals Home Exercise Program Pt/caregiver will Perform Home Exercise Program: For increased ROM;For increased strengthening;With Supervision, verbal cues required/provided PT Short Term Goals Time to Complete Short Term Goals: 2 weeks PT Short Term Goal 1: Pt will present with minimal fascial restrictions in order to report pain to Lt knee less than 4/10 when walking.  PT Short Term Goal 1 - Progress: Progressing toward goal PT Short Term Goal 2: Pt will improve his Lt knee AROM: 0-110 degrees for improved gait mechanics.  PT Short Term Goal 2 - Progress: Progressing toward goal PT Short Term Goal 3: Pt will improve Lt knee strength in order to ambulate independently with moderate gait impairments PT Short Term Goal 3 - Progress: Progressing toward goal PT Long Term Goals Time to Complete Long Term Goals: 4 weeks PT Long Term Goal 1: Pt will improve his Lt knee AROM to [facility] in order to to ambualte independently with minimal gait impairments.  PT Long Term Goal 1 - Progress: Progressing toward goal PT Long Term Goal 2: Pt will improve LLE to [facility]  in order to ascend 2 flights of stairs with 1 handrail with alternating pattern to get into his house with greater ease.  PT Long Term Goal 2 - Progress: Progressing toward goal Long Term Goal 3: Pt will improve his FOTO to status greater than 50% and limiation less than 50% Long Term Goal 3 Progress: Met  Problem List Patient Active Problem List   Diagnosis Date Noted  . Stiffness of left knee 02/05/2013  . Difficulty in walking(719.7) 02/05/2013  . Status post left knee replacement 01/29/2013  . OA (osteoarthritis) of knee 10/28/2012  . Left knee pain 10/28/2012  . Routine general medical examination at a health care facility 09/25/2012  . Guaiac positive stools 09/25/2012  . Other and unspecified hyperlipidemia 09/25/2012  . Encounter for long-term (current) use of anticoagulants 06/21/2011  . Secondary cardiomyopathy, unspecified 06/16/2011  . Atrial fibrillation 06/15/2011  . CKD (chronic kidney disease) stage 2, GFR 60-89 ml/min 06/15/2011    PT - End of Session Activity Tolerance: Patient tolerated treatment well General Behavior During Therapy: [facility] for tasks assessed/performed  GP    Cielo Arias, MPT, ATC 03/04/2013, 2:04 PM  Physician Documentation Your signature is required to indicate approval of the treatment plan as stated above.  Please sign and either send electronically or make a copy of this report for your files and return this physician signed original.   Please mark one 1.__approve of plan  2. ___approve of plan with the following conditions.   ______________________________                                                          _____________________ Physician Signature                                                                                                             Date

## 2013-03-10 ENCOUNTER — Ambulatory Visit (HOSPITAL_COMMUNITY)
Admission: RE | Admit: 2013-03-10 | Discharge: 2013-03-10 | Disposition: A | Discharge status: Home / Self Care | Payer: BC Managed Care – PPO | Source: Ambulatory Visit | Attending: Family Medicine | Admitting: Family Medicine

## 2013-03-10 DIAGNOSIS — M25569 Pain in unspecified knee: Secondary | ICD-10-CM | POA: Insufficient documentation

## 2013-03-10 DIAGNOSIS — Z96652 Presence of left artificial knee joint: Secondary | ICD-10-CM

## 2013-03-10 DIAGNOSIS — IMO0001 Reserved for inherently not codable concepts without codable children: Secondary | ICD-10-CM | POA: Insufficient documentation

## 2013-03-10 DIAGNOSIS — R262 Difficulty in walking, not elsewhere classified: Secondary | ICD-10-CM | POA: Insufficient documentation

## 2013-03-10 DIAGNOSIS — M25669 Stiffness of unspecified knee, not elsewhere classified: Secondary | ICD-10-CM | POA: Insufficient documentation

## 2013-03-10 DIAGNOSIS — M25562 Pain in left knee: Secondary | ICD-10-CM

## 2013-03-10 DIAGNOSIS — M25662 Stiffness of left knee, not elsewhere classified: Secondary | ICD-10-CM

## 2013-03-10 NOTE — Progress Notes (Signed)
Physical Therapy Treatment Patient Details  Name: David [name] MRN: [id number] Date of Birth: 17-Nov-1960  Today's Date: 03/10/2013 Time: 1345-1440 PT Time Calculation (min): 55 min  Visit#: 11 of 18   Authorization: [employer]   Subjective: Symptoms/Limitations Symptoms: Pt states he forgot his cane Pain Assessment Currently in Pain?: Yes Pain Score: 4  Pain Location: Knee Pain Orientation: Left    Exercise/Treatments      Stretches Gastroc Stretch: 3 reps;30 seconds Gastroc Stretch Limitations: slant board stretch Aerobic Elliptical: NuStep: 10 minutes, Hills 2, resistance 4 Machines for Strengthening Cybex Knee Extension: Lt LE only 3Pl x15 Cybex Knee Flexion: Lt LE 2.5  PL x15 Cybex Leg Press: Lt LE 3PL x 10   Standing Terminal Knee Extension Limitations: 10 Rocker Board: 2 minutes squat pick up ball up to toes x 10      Cryotherapy Number Minutes Cryotherapy: 10 Minutes Cryotherapy Location: Knee Type of Cryotherapy: Ice pack  Physical Therapy Assessment and Plan PT Assessment and Plan Clinical Impression Statement: Pt completed session focusing on current limitations.  PT completed therapist facilitated exercise to address hamstring strength and balance.   Gt is improving significantly PT Plan: Continued to improve gait mechanics (may add elliptical for proper motion), stair training, improvement in gait mechanics.     Goals  progressing  Problem List Patient Active Problem List   Diagnosis Date Noted  . Stiffness of left knee 02/05/2013  . Difficulty in walking(719.7) 02/05/2013  . Status post left knee replacement 01/29/2013  . OA (osteoarthritis) of knee 10/28/2012  . Left knee pain 10/28/2012  . Routine general medical examination at a health care facility 09/25/2012  . Guaiac positive stools 09/25/2012  . Other and unspecified hyperlipidemia 09/25/2012  . Encounter for long-term (current) use of anticoagulants 06/21/2011  . Secondary  cardiomyopathy, unspecified 06/16/2011  . Atrial fibrillation 06/15/2011  . CKD (chronic kidney disease) stage 2, GFR 60-89 ml/min 06/15/2011    PT - End of Session Activity Tolerance: Patient tolerated treatment well  GP    Dierre,CINDY 03/10/2013, 4:55 PM

## 2013-03-11 ENCOUNTER — Ambulatory Visit (HOSPITAL_COMMUNITY): Payer: BC Managed Care – PPO | Admitting: Physical Therapy

## 2013-03-11 ENCOUNTER — Telehealth: Payer: Self-pay | Admitting: Orthopedic Surgery

## 2013-03-11 ENCOUNTER — Other Ambulatory Visit: Payer: Self-pay | Admitting: Orthopedic Surgery

## 2013-03-11 MED ORDER — HYDROCODONE-ACETAMINOPHEN 10-325 MG PO TABS: 1.0000 | ORAL_TABLET | ORAL | Status: DC | PRN

## 2013-03-11 NOTE — Telephone Encounter (Signed)
Jhonny Calixto wants a prescription for Oxycodone  [phone]

## 2013-03-12 ENCOUNTER — Other Ambulatory Visit: Payer: Self-pay | Admitting: Orthopedic Surgery

## 2013-03-12 MED ORDER — HYDROCODONE-ACETAMINOPHEN 10-325 MG PO TABS: 1.0000 | ORAL_TABLET | ORAL | Status: DC | PRN

## 2013-03-13 ENCOUNTER — Ambulatory Visit (HOSPITAL_COMMUNITY)
Admission: RE | Admit: 2013-03-13 | Discharge: 2013-03-13 | Disposition: A | Discharge status: Home / Self Care | Payer: BC Managed Care – PPO | Source: Ambulatory Visit

## 2013-03-13 DIAGNOSIS — Z96652 Presence of left artificial knee joint: Secondary | ICD-10-CM

## 2013-03-13 DIAGNOSIS — R262 Difficulty in walking, not elsewhere classified: Secondary | ICD-10-CM

## 2013-03-13 DIAGNOSIS — M25662 Stiffness of left knee, not elsewhere classified: Secondary | ICD-10-CM

## 2013-03-13 DIAGNOSIS — M25562 Pain in left knee: Secondary | ICD-10-CM

## 2013-03-13 NOTE — Progress Notes (Addendum)
Physical Therapy Treatment Patient Details  Name: David [name] MRN: [id number] Date of Birth: 12/11/1960  Today's Date: 03/13/2013 Time: 1300-1355 PT Time Calculation (min): 55 min Charge: TE 1300-1330, Manual 1330-1345, Ice 1345-1355  Visit#: 12 of 18  Re-eval: 04/01/13    Subjective: Symptoms/Limitations Symptoms: Pt reported feeling nauseous today, stated change in pain medication yesterday.  Current pain scale 6/10 Lt knee Pain Assessment Currently in Pain?: Yes Pain Score: 6  Pain Location: Knee Pain Orientation: Left  Precautions/Restrictions  Precautions Precaution Comments: A-fib has decreased activity tolerance  Exercise/Treatments  Aerobic Elliptical: 5' L1 Machines for Strengthening Cybex Knee Extension: Lt LE only 2.5Pl x15 Cybex Knee Flexion: Lt LE 3 PL x15 Cybex Leg Press: Lt LE 4PL x 10 Standing Terminal Knee Extension: Left;15 reps;Theraband Theraband Level (Terminal Knee Extension): Level 4 (Blue) Terminal Knee Extension Limitations: 10 second holds Gait Training: squat pick up ball up to toes x 10   Modalities Modalities: Cryotherapy Manual Therapy Manual Therapy: Myofascial release Myofascial Release: MFR to perimeter of knee to reduce fascial restrictions and scar tissue  Cryotherapy Number Minutes Cryotherapy: 10 Minutes Cryotherapy Location: Knee Type of Cryotherapy: Ice pack  Physical Therapy Assessment and Plan PT Assessment and Plan Clinical Impression Statement: Pt limited by decreased activity tolerance due to nausea from change of pain medication and limited by pain this session.  Changed aerobic to elliptical to improve actiivity tolerance to improve gait mechanics.  Manual techniques complete to reduce  fascial restrictions to improve ROM and decrease pain.  Ended session with ice for pain control. PT Plan: Continued to improve gait mechanics (may add elliptical for proper motion), stair training, improvement in gait mechanics.      Goals Home Exercise Program Pt/caregiver will Perform Home Exercise Program: For increased ROM;For increased strengthening;With Supervision, verbal cues required/provided PT Short Term Goals Time to Complete Short Term Goals: 2 weeks PT Short Term Goal 1: Pt will present with minimal fascial restrictions in order to report pain to Lt knee less than 4/10 when walking.  PT Short Term Goal 1 - Progress: Progressing toward goal PT Short Term Goal 2: Pt will improve his Lt knee AROM: 0-110 degrees for improved gait mechanics.  PT Short Term Goal 2 - Progress: Progressing toward goal PT Short Term Goal 3: Pt will improve Lt knee strength in order to ambulate independently with moderate gait impairments PT Short Term Goal 3 - Progress: Progressing toward goal PT Long Term Goals Time to Complete Long Term Goals: 4 weeks PT Long Term Goal 1: Pt will improve his Lt knee AROM to [facility] in order to to ambualte independently with minimal gait impairments.  PT Long Term Goal 1 - Progress: Progressing toward goal PT Long Term Goal 2: Pt will improve LLE to [facility] in order to ascend 2 flights of stairs with 1 handrail with alternating pattern to get into his house with greater ease.  Long Term Goal 3: Pt will improve his FOTO to status greater than 50% and limiation less than 50%  Problem List Patient Active Problem List   Diagnosis Date Noted  . Stiffness of left knee 02/05/2013  . Difficulty in walking(719.7) 02/05/2013  . Status post left knee replacement 01/29/2013  . OA (osteoarthritis) of knee 10/28/2012  . Left knee pain 10/28/2012  . Routine general medical examination at a health care facility 09/25/2012  . Guaiac positive stools 09/25/2012  . Other and unspecified hyperlipidemia 09/25/2012  . Encounter for long-term (current) use of anticoagulants  06/21/2011  . Secondary cardiomyopathy, unspecified 06/16/2011  . Atrial fibrillation 06/15/2011  . CKD (chronic kidney disease) stage 2, GFR  60-89 ml/min 06/15/2011    PT - End of Session Activity Tolerance: Patient tolerated treatment well General Behavior During Therapy: [facility] for tasks assessed/performed  GP    Juel Burrow 03/13/2013, 5:19 PM

## 2013-03-17 ENCOUNTER — Ambulatory Visit (HOSPITAL_COMMUNITY): Payer: BC Managed Care – PPO | Admitting: Physical Therapy

## 2013-03-18 ENCOUNTER — Other Ambulatory Visit: Payer: Self-pay | Admitting: Cardiology

## 2013-03-18 ENCOUNTER — Ambulatory Visit (HOSPITAL_COMMUNITY): Payer: BC Managed Care – PPO | Admitting: Physical Therapy

## 2013-03-19 ENCOUNTER — Ambulatory Visit (INDEPENDENT_AMBULATORY_CARE_PROVIDER_SITE_OTHER): Payer: BC Managed Care – PPO | Admitting: *Deleted

## 2013-03-19 ENCOUNTER — Ambulatory Visit (HOSPITAL_COMMUNITY)
Admission: RE | Admit: 2013-03-19 | Discharge: 2013-03-19 | Disposition: A | Discharge status: Home / Self Care | Payer: BC Managed Care – PPO | Source: Ambulatory Visit | Attending: Family Medicine | Admitting: Family Medicine

## 2013-03-19 DIAGNOSIS — I4891 Unspecified atrial fibrillation: Secondary | ICD-10-CM

## 2013-03-19 DIAGNOSIS — Z7901 Long term (current) use of anticoagulants: Secondary | ICD-10-CM

## 2013-03-19 DIAGNOSIS — G459 Transient cerebral ischemic attack, unspecified: Secondary | ICD-10-CM

## 2013-03-19 LAB — POCT INR: INR: 2.2

## 2013-03-19 NOTE — Progress Notes (Signed)
Physical Therapy Treatment Patient Details  Name: David [name] MRN: [id number] Date of Birth: 04/12/1960  Today's Date: 03/19/2013 Time: 1345-1440 PT Time Calculation (min): 55 min Charges:  TE: 1345-1420 Manual: 1420-1430 Ice: 1  Visit#: 13 of 18  Re-eval: 04/01/13    Authorization: BC[employer]  Authorization Time Period:    Authorization Visit#:   of     Subjective: Symptoms/Limitations Symptoms: Reports continued difficulty going up and down steps.   Pain Assessment Pain Score: 5  Pain Location: Knee  Precautions/Restrictions     Exercise/Treatments Aerobic Elliptical: 10' Level 3 for activity tolerance Isokinetic: 180<>150<>120 x10 reps each  Machines for Strengthening Cybex Knee Flexion: BLE 5 PL x20 reps working on [employer] Standing Lateral Step Up: Left;Hand Hold: 0;Limitations;20 reps;Step Height: 6" Forward Step Up: Left;Hand Hold: 0;Step Height: 6";20 reps Rocker Board: 2 minutes  Manual Therapy Myofascial Release: aggressive MFR throughout LLE knee, quadricep and gastroc region to decrease fascial restrcitions. With deep and soft tissue massage after to decrease pain.  Cryotherapy Number Minutes Cryotherapy: 10 Minutes Cryotherapy Location: Knee Type of Cryotherapy: Ice pack  Physical Therapy Assessment and Plan PT Assessment and Plan Clinical Impression Statement: Continued with functional strengthening exercises to improve overall gait mechanics and improvment with stair training.  Added biodex exercises to focus on localized hamstring and quadricep strengthening and to encourage knee flexion. Pt has 75% reducation in fascial restrictions after manual techniques with most pain to medial-distal knee.  PT Plan: Continue with stair and gait training.     Goals    Problem List Patient Active Problem List   Diagnosis Date Noted  . Stiffness of left knee 02/05/2013  . Difficulty in walking(719.7) 02/05/2013  . Status post left knee replacement 01/29/2013   . OA (osteoarthritis) of knee 10/28/2012  . Left knee pain 10/28/2012  . Routine general medical examination at a health care facility 09/25/2012  . Guaiac positive stools 09/25/2012  . Other and unspecified hyperlipidemia 09/25/2012  . Encounter for long-term (current) use of anticoagulants 06/21/2011  . Secondary cardiomyopathy, unspecified 06/16/2011  . Atrial fibrillation 06/15/2011  . CKD (chronic kidney disease) stage 2, GFR 60-89 ml/min 06/15/2011    PT - End of Session Activity Tolerance: Patient tolerated treatment well General Behavior During Therapy: [facility] for tasks assessed/performed  GP    Skya Mccullum, MPT, ATC 03/19/2013, 2:38 PM

## 2013-03-23 ENCOUNTER — Ambulatory Visit (HOSPITAL_COMMUNITY): Payer: BC Managed Care – PPO | Admitting: Physical Therapy

## 2013-03-24 ENCOUNTER — Telehealth: Payer: Self-pay | Admitting: Orthopedic Surgery

## 2013-03-24 ENCOUNTER — Other Ambulatory Visit: Payer: Self-pay | Admitting: Orthopedic Surgery

## 2013-03-24 ENCOUNTER — Ambulatory Visit (HOSPITAL_COMMUNITY)
Admission: RE | Admit: 2013-03-24 | Discharge: 2013-03-24 | Disposition: A | Discharge status: Home / Self Care | Payer: BC Managed Care – PPO | Source: Ambulatory Visit | Attending: Physical Therapy | Admitting: Physical Therapy

## 2013-03-24 MED ORDER — HYDROCODONE-ACETAMINOPHEN 10-325 MG PO TABS: 1.0000 | ORAL_TABLET | ORAL | Status: DC | PRN

## 2013-03-24 NOTE — Progress Notes (Signed)
Physical Therapy Treatment Patient Details  Name: David [name] MRN: [id number] Date of Birth: July 08, 1960  Today's Date: 03/24/2013 Time: 1350-1426 PT Time Calculation (min): 36 min Visit#: 14 of 18  Re-eval: 04/01/13 Authorization: [employer]BS  Charges:  Manual 25', icepack 10'  Subjective: Symptoms/Limitations Symptoms: Pt states that he fell on 12/12 (Friday) while getting in groceries for his wife.  States he fell down on his knee but caught the most of his weight through his hand.  Noted skint area on palm of hand and Lt knee.  Pt reports he notified MD office and is waiting for return call regarding need for MD to see him.  Pt reports he got his hydrocodone refilled today but has not picked up prescription.  Currently 7/10 pain in Lt knee without medication. Pain Assessment Currently in Pain?: Yes Pain Score: 7     Exercise/Treatments Held today     Physical Therapy Assessment and Plan PT Assessment and Plan Clinical Impression Statement: Held therex today as patient reported increase in pain and unsure of ability to complete.  Noted with slight increase in swelling/heat and small abrasion medial knee (pt has covered with bandaid).  Focused session on reduction of fascial restrictions and associated pain/swell.  Noted majority of adhesions infrapatellar region with discomfort on palpation.  Able to reduce adhesions.  Completed session with icepack/elevation to Lt knee.  Pt reported overall pain reduction at end of session, no number given. PT Plan: Await further MD instruction; assume OK to resume regular activity/exercise if not informed otherwise.  Continue with stair and gait training.      Problem List Patient Active Problem List   Diagnosis Date Noted  . Stiffness of left knee 02/05/2013  . Difficulty in walking(719.7) 02/05/2013  . Status post left knee replacement 01/29/2013  . OA (osteoarthritis) of knee 10/28/2012  . Left knee pain 10/28/2012  . Routine general  medical examination at a health care facility 09/25/2012  . Guaiac positive stools 09/25/2012  . Other and unspecified hyperlipidemia 09/25/2012  . Encounter for long-term (current) use of anticoagulants 06/21/2011  . Secondary cardiomyopathy, unspecified 06/16/2011  . Atrial fibrillation 06/15/2011  . CKD (chronic kidney disease) stage 2, GFR 60-89 ml/min 06/15/2011    PT - End of Session Activity Tolerance: Patient tolerated treatment well General Behavior During Therapy: [facility] for tasks assessed/performed   Lurena Nida, PTA/CLT 03/24/2013, 2:44 PM

## 2013-03-24 NOTE — Telephone Encounter (Signed)
David [name] wants a prescription for Hydrocodone

## 2013-03-24 NOTE — Telephone Encounter (Signed)
Routing to Dr Harrison 

## 2013-03-24 NOTE — Telephone Encounter (Signed)
At time of pickup of prescription refill, patient relates that he fell on Friday, 03/20/13; states he did not mention this to the nurse at time of calling for his refill.  States "he came down mainly on his hand, but also did come down some onto his left knee (had total knee surgery 01/16/13.  States feels better now, although is still concerned, and said he also did need to re-schedule his physical therapy appointment at [facility] from Friday to today, 03/24/13, 1:45pm.  His next scheduled appointment in our office is 04/07/13.  Please advise. His cell ph# is [phone].

## 2013-03-25 ENCOUNTER — Ambulatory Visit (HOSPITAL_COMMUNITY): Payer: BC Managed Care – PPO | Admitting: Physical Therapy

## 2013-04-03 ENCOUNTER — Ambulatory Visit (HOSPITAL_COMMUNITY)
Admission: RE | Admit: 2013-04-03 | Discharge: 2013-04-03 | Disposition: A | Discharge status: Home / Self Care | Payer: BC Managed Care – PPO | Source: Ambulatory Visit | Attending: Family Medicine | Admitting: Family Medicine

## 2013-04-03 DIAGNOSIS — M25562 Pain in left knee: Secondary | ICD-10-CM

## 2013-04-03 DIAGNOSIS — Z96652 Presence of left artificial knee joint: Secondary | ICD-10-CM

## 2013-04-03 DIAGNOSIS — R262 Difficulty in walking, not elsewhere classified: Secondary | ICD-10-CM

## 2013-04-03 DIAGNOSIS — M25662 Stiffness of left knee, not elsewhere classified: Secondary | ICD-10-CM

## 2013-04-03 NOTE — Progress Notes (Signed)
Physical Therapy Re-evaluation/Treatment  Patient Details  Name: David [name] MRN: [id number] Date of Birth: 1961-02-25  Today's Date: 04/03/2013 Time: 4098-1191 PT Time Calculation (min): 65 min Charge: TE 4782-9562, [place] 1018-1030, Estim w/ ice 1030-1045              Visit#: 15 of 18  Re-eval: 04/17/12 Assessment Diagnosis: Lt TKR Surgical Date: 01/16/13 Next MD Visit: Dr. Romeo Apple - 04/07/2013 Prior Therapy: HHPT  Authorization: [employer]    Authorization Time Period:    Authorization Visit#:   of     Subjective Symptoms/Limitations Symptoms: Pt stated he was tired today, late night at Christmas gathering.  Reported pain scale 5/10, has not talked to MD so assumed can return to therex today (from his fall while getting groceries on 12/23, left a message for MD). How long can you sit comfortably?: unlimited length of time How long can you stand comfortably?: 30 minutes (was 3-4 minutes ) How long can you walk comfortably?: 15-20 without SPC, at least an hour with SPC ( was 30-45 minutes with the RW through [employer]) Pain Assessment Currently in Pain?: Yes Pain Score: 5  Pain Location: Knee Pain Orientation: Left  Precautions/Restrictions  Precautions Precaution Comments: A-fib has decreased activity tolerance  Sensation/Coordination/Flexibility/Functional Tests Functional Tests Functional Tests: FOTO: Status52% Limiation: 48% (was 24/76)   Assessment LLE AROM (degrees) Left Knee Extension:  (0) Left Knee Flexion: 110 LLE PROM (degrees) Left Knee Flexion: 115  Exercise/Treatments Stretches Gastroc Stretch: 3 reps;30 seconds Gastroc Stretch Limitations: slant board stretch Aerobic Isokinetic: 180<>150<>120 x10 reps each  Machines for Strengthening Cybex Knee Extension: Lt LE only 3Pl x20 Cybex Knee Flexion: BLE 5 PL x20 reps working on [employer] Lateral Step Up: Left;Hand Hold: 0;Limitations;20 reps;Step Height: 6" Forward Step Up: Left;Hand Hold:  0;Step Height: 6";20 reps Step Down: Left;20 reps;Hand Hold: 0;Step Height: 6" Rocker Board: 2 minutes   Modalities Modalities: Electrical Stimulation;Cryotherapy Manual Therapy Manual Therapy: Myofascial release Myofascial Release: aggressive MFR throughout LLE knee, quadricep and gastroc region to decrease fascial restrcitions. With deep and soft tissue massage after to decrease pain Cryotherapy Number Minutes Cryotherapy: 15 Minutes Cryotherapy Location: Knee Type of Cryotherapy: Ice pack Pharmacologist Location: Lt TEFL teacher Action: interferential electrical stimulation to distal quadricep Electrical Stimulation Parameters: Continous 80/150 12.5 volts Electrical Stimulation Goals: Pain  Physical Therapy Assessment and Plan PT Assessment and Plan Clinical Impression Statement: Re-eval complete with the following findings:  Pt has had 15 OPPT sessions over 8 sessions.  Pt has met 2/3 STGs and 1/3 LTGs.  Pt independent with HEP daily and able to demonstrate appropriate technique with all exercises.  Pt stregth has returned to [facility] with increased ease with daily functional tasks.  Pt continues to have decreased AROM, decreased AROM following fall last week with increased tenderness to area and increased edema to knee.  Pt with decreased FOTO this session as well due to increase fear of falling.  Pt continues to have mild to moderate fascial restrictions of lateral and posterior knee, manual techniques were complete to reduce these restrictions.  Pt will continue to benefit from skilled intervention to improve AROM, increased balance training to improve gait mechanics and reduce fear of falling and manual techniques to reduce fascial restrictions. PT Plan: Recommend  continuing OPPT for 2 more weeks  to improve AROM, increased balance training to improve gait mechanics and reduce fear of falling and manual techniques to reduce fascial  restrictions.    Goals  Home Exercise Program Pt/caregiver will Perform Home Exercise Program: For increased ROM;For increased strengthening;With Supervision, verbal cues required/provided PT Goal: Perform Home Exercise Program - Progress: Met PT Short Term Goals Time to Complete Short Term Goals: 2 weeks PT Short Term Goal 1: Pt will present with minimal fascial restrictions in order to report pain to Lt knee less than 4/10 when walking.  PT Short Term Goal 1 - Progress: Progressing toward goal PT Short Term Goal 2: Pt will improve his Lt knee AROM: 0-110 degrees for improved gait mechanics.  PT Short Term Goal 2 - Progress: Met (0-110) PT Short Term Goal 3: Pt will improve Lt knee strength in order to ambulate independently with moderate gait impairments PT Short Term Goal 3 - Progress: Met PT Long Term Goals Time to Complete Long Term Goals: 4 weeks PT Long Term Goal 1: Pt will improve his Lt knee AROM to [facility] in order to to ambualte independently with minimal gait impairments.  PT Long Term Goal 1 - Progress: Progressing toward goal PT Long Term Goal 2: Pt will improve LLE to [facility] in order to ascend 2 flights of stairs with 1 handrail with alternating pattern to get into his house with greater ease.  PT Long Term Goal 2 - Progress: Progressing toward goal Long Term Goal 3: Pt will improve his FOTO to status greater than 50% and limiation less than 50% Long Term Goal 3 Progress: Met (58/48)  Problem List Patient Active Problem List   Diagnosis Date Noted  . Stiffness of left knee 02/05/2013  . Difficulty in walking(719.7) 02/05/2013  . Status post left knee replacement 01/29/2013  . OA (osteoarthritis) of knee 10/28/2012  . Left knee pain 10/28/2012  . Routine general medical examination at a health care facility 09/25/2012  . Guaiac positive stools 09/25/2012  . Other and unspecified hyperlipidemia 09/25/2012  . Encounter for long-term (current) use of anticoagulants 06/21/2011  .  Secondary cardiomyopathy, unspecified 06/16/2011  . Atrial fibrillation 06/15/2011  . CKD (chronic kidney disease) stage 2, GFR 60-89 ml/min 06/15/2011    PT - End of Session Activity Tolerance: Patient tolerated treatment well General Behavior During Therapy: WFL for tasks assessed/performed  GP    Juel Burrow; Annett Fabian, MPT, ATC 04/03/2013, 2:33 PM  Physician Documentation Your signature is required to indicate approval of the treatment plan as stated above.  Please sign and either send electronically or make a copy of this report for your files and return this physician signed original.   Please mark one 1.__approve of plan  2. ___approve of plan with the following conditions.   ______________________________                                                          _____________________ Physician Signature  Date  

## 2013-04-06 ENCOUNTER — Ambulatory Visit (INDEPENDENT_AMBULATORY_CARE_PROVIDER_SITE_OTHER): Payer: BC Managed Care – PPO | Admitting: Cardiology

## 2013-04-06 ENCOUNTER — Encounter: Payer: Self-pay | Admitting: Cardiology

## 2013-04-06 VITALS — BP 164/84 | HR 58 | Ht 71.0 in | Wt 216.0 lb

## 2013-04-06 DIAGNOSIS — N182 Chronic kidney disease, stage 2 (mild): Secondary | ICD-10-CM

## 2013-04-06 DIAGNOSIS — I429 Cardiomyopathy, unspecified: Secondary | ICD-10-CM

## 2013-04-06 DIAGNOSIS — I4891 Unspecified atrial fibrillation: Secondary | ICD-10-CM

## 2013-04-06 NOTE — Progress Notes (Signed)
Clinical Summary David [name] is a 52 y.o.male last seen in August. Record review finds left total knee arthroplasty back in October with Dr. Romeo Apple. He is still undergoing rehabilitation. From a cardiac perspective, no chest pain or palpitations. States he feels "tired." Otherwise no specific complaints.  ECG from August showed sinus rhythm with prolonged PR, left atrial enlargement and leftward axis.  Her from October showed potassium 3.8, BUN 19, creatinine 1.4, hemoglobin 8.4, platelets 229. TSH in June was 2.0, and LFTs were normal at that time as well.   Allergies  Allergen Reactions  . Azithromycin Itching    Current Outpatient Prescriptions  Medication Sig Dispense Refill  . allopurinol (ZYLOPRIM) 300 MG tablet Take 300 mg by mouth daily.       Marland Kitchen amiodarone (PACERONE) 200 MG tablet Take 1 tablet (200 mg total) by mouth every morning.  90 tablet  3  . carvedilol (COREG) 3.125 MG tablet Take 3.125 mg by mouth 2 (two) times daily with a meal.      . famotidine (PEPCID) 20 MG tablet Take 1 tablet (20 mg total) by mouth daily.  30 tablet  6  . furosemide (LASIX) 40 MG tablet Take 0.5 tablets (20 mg total) by mouth every morning.  30 tablet  5  . HYDROcodone-acetaminophen (NORCO) 10-325 MG per tablet Take 1 tablet by mouth every 4 (four) hours as needed.  84 tablet  0  . KLOR-CON M10 10 MEQ tablet TAKE ONE TABLET BY MOUTH ONCE DAILY IN THE MORNING  30 tablet  6  . lisinopril (PRINIVIL,ZESTRIL) 5 MG tablet Take 1 tablet (5 mg total) by mouth daily.  30 tablet  3  . polyethylene glycol (MIRALAX / GLYCOLAX) packet Take 17 g by mouth daily.  14 each  0  . warfarin (COUMADIN) 5 MG tablet Take 1 tablet (5 mg total) by mouth daily at 6 PM.  10 tablet  0   No current facility-administered medications for this visit.    Past Medical History  Diagnosis Date  . Atrial fibrillation   . Alcohol abuse   . TIA (transient ischemic attack)   . Nonischemic cardiomyopathy     LVEF 20-25%  improved to 50% on medical therapy  . Chronic kidney disease, stage 2, mildly decreased GFR   . GERD (gastroesophageal reflux disease)   . Arthritis     Bilateral knees   . Stroke     states ministroke about 06/2011. No deficits  . Gout     Social History David [name] reports that he quit smoking about 3 years ago. His smoking use included Cigarettes. He smoked 0.00 packs per day. He does not have any smokeless tobacco history on file. David [name] reports that he drinks about 0.6 ounces of alcohol per week.  Review of Systems Left knee swelling, slowly improving. No orthopnea or PND. No bleeding episodes. Otherwise negative.  Physical Examination Filed Vitals:   04/06/13 0852  BP: 164/84  Pulse: 58   Filed Weights   04/06/13 0852  Weight: 216 lb (97.977 kg)    Appears comfortable at rest.  HEENT: Conjunctiva and lids normal, oropharynx clear.  Neck: Supple, no elevated JVP or carotid bruits, no thyromegaly.  Lungs: Clear to auscultation, nonlabored breathing at rest.  Cardiac:RRR, no S3 or significant systolic murmur, no pericardial rub.  Abdomen: Soft, nontender, bowel sounds present, no guarding or rebound.  Extremities: No pitting edema, left knee status post TKA with swelling, distal pulses 2+.  Skin: Warm  and dry.  Musculoskeletal: No kyphosis.  Neuropsychiatric: Alert and oriented x3, affect grossly appropriate.   Problem List and Plan   Atrial fibrillation Well controlled, regular rate and rhythm today. Plan will be to continue current regimen including amiodarone and Coumadin. Followup TSH and LFTs to be obtained.  Secondary cardiomyopathy, unspecified Symptomatically stable, LVEF improved to 50% on medical therapy by most recent assessment.  CKD (chronic kidney disease) stage 2, GFR 60-89 ml/min Creatinine 1.4 from October.    Jonelle Sidle, M.D., F.A.C.C.

## 2013-04-06 NOTE — Patient Instructions (Signed)
Your physician recommends that you schedule a follow-up appointment in: 6 months with Dr Randa Spike will receive a reminder letter two months in advance reminding you to call and schedule your appointment. If you don't receive this letter, please contact our office.  Your physician recommends that you return for lab work in a couple of weeks. TSH, LFT's

## 2013-04-06 NOTE — Assessment & Plan Note (Signed)
Symptomatically stable, LVEF improved to 50% on medical therapy by most recent assessment.

## 2013-04-06 NOTE — Assessment & Plan Note (Signed)
Well controlled, regular rate and rhythm today. Plan will be to continue current regimen including amiodarone and Coumadin. Followup TSH and LFTs to be obtained.

## 2013-04-06 NOTE — Assessment & Plan Note (Signed)
Creatinine 1.4 from October.

## 2013-04-07 ENCOUNTER — Encounter: Payer: Self-pay | Admitting: Orthopedic Surgery

## 2013-04-07 ENCOUNTER — Ambulatory Visit (INDEPENDENT_AMBULATORY_CARE_PROVIDER_SITE_OTHER): Payer: BC Managed Care – PPO | Admitting: Orthopedic Surgery

## 2013-04-07 VITALS — BP 136/85 | Ht 71.0 in | Wt 224.0 lb

## 2013-04-07 DIAGNOSIS — Z96652 Presence of left artificial knee joint: Secondary | ICD-10-CM

## 2013-04-07 DIAGNOSIS — Z96659 Presence of unspecified artificial knee joint: Secondary | ICD-10-CM

## 2013-04-07 MED ORDER — HYDROCODONE-ACETAMINOPHEN 10-325 MG PO TABS: 1.0000 | ORAL_TABLET | ORAL | Status: AC | PRN

## 2013-04-07 NOTE — Progress Notes (Signed)
Patient ID: David [name], male   DOB: December 12, 1960, 52 y.o.   MRN: [id number]  Chief Complaint  Patient presents with  . Follow-up    6 week recheck left TKA DOS 01/16/13    Postop week #10 left knee replacement current range of motion 0-110  Current pain medication Norco 10 mg  Current ambulatory status with assistive cane as needed  No instability detected, the patient did have a fall seemed to recover from it without any incident. He has a full-strength leg raise without extensor lag no effusion.  Status post knee replacement return in 3 months continue hydrocodone 10 mg #120

## 2013-04-16 ENCOUNTER — Ambulatory Visit (INDEPENDENT_AMBULATORY_CARE_PROVIDER_SITE_OTHER): Payer: BC Managed Care – PPO | Admitting: *Deleted

## 2013-04-16 DIAGNOSIS — Z7901 Long term (current) use of anticoagulants: Secondary | ICD-10-CM

## 2013-04-16 DIAGNOSIS — I4891 Unspecified atrial fibrillation: Secondary | ICD-10-CM

## 2013-04-16 DIAGNOSIS — G459 Transient cerebral ischemic attack, unspecified: Secondary | ICD-10-CM

## 2013-04-16 LAB — POCT INR: INR: 2.6

## 2013-04-22 ENCOUNTER — Telehealth: Payer: Self-pay | Admitting: Orthopedic Surgery

## 2013-04-22 ENCOUNTER — Other Ambulatory Visit: Payer: Self-pay | Admitting: Orthopedic Surgery

## 2013-04-22 MED ORDER — HYDROCODONE-ACETAMINOPHEN 7.5-325 MG PO TABS: 1.0000 | ORAL_TABLET | ORAL | Status: DC | PRN

## 2013-04-22 MED ORDER — HYDROCODONE-ACETAMINOPHEN 7.5-325 MG PO TABS: 1.0000 | ORAL_TABLET | Freq: Four times a day (QID) | ORAL | Status: AC | PRN

## 2013-04-22 NOTE — Telephone Encounter (Signed)
Prescription picked up by the patient °

## 2013-04-22 NOTE — Telephone Encounter (Signed)
Routing to Dr Harrison 

## 2013-04-22 NOTE — Telephone Encounter (Signed)
Dr. Romeo Apple filled, and I called and notified patient that they were ready to be picked up.

## 2013-04-22 NOTE — Telephone Encounter (Signed)
David [name] wants a prescription for Hydrocodone  10/325

## 2013-04-25 ENCOUNTER — Other Ambulatory Visit: Payer: Self-pay | Admitting: Cardiology

## 2013-04-27 ENCOUNTER — Other Ambulatory Visit: Payer: Self-pay

## 2013-04-27 MED ORDER — LISINOPRIL 5 MG PO TABS: 5.0000 mg | ORAL_TABLET | Freq: Every day | ORAL | Status: DC

## 2013-04-30 ENCOUNTER — Other Ambulatory Visit: Payer: Self-pay | Admitting: Cardiology

## 2013-05-01 ENCOUNTER — Telehealth: Payer: Self-pay | Admitting: *Deleted

## 2013-05-01 NOTE — Telephone Encounter (Signed)
Pt called wanting samples for his medications as he can't refill for one week and wanted samples. I told him we do not carry Coreg, Amiodarone and Lasix.  I suggested he see if [facility] could offer him three day free supply

## 2013-05-01 NOTE — Telephone Encounter (Signed)
Pt has run out of 6 medications and can not get them filled till 05/08/13 on payday. He has been out for two days already. Can we check samples?

## 2013-05-01 NOTE — Telephone Encounter (Signed)
Noted pt can contact pharmacy to see if they will provide medicine until his money comes in

## 2013-05-01 NOTE — Telephone Encounter (Signed)
No samples available, will review other options for pt

## 2013-05-13 ENCOUNTER — Telehealth: Payer: Self-pay | Admitting: Orthopedic Surgery

## 2013-05-13 ENCOUNTER — Other Ambulatory Visit: Payer: Self-pay | Admitting: Cardiology

## 2013-05-13 NOTE — Telephone Encounter (Signed)
David [name] wants a prescription for Hydrocodone 7.5/325  His # [phone]

## 2013-05-13 NOTE — Telephone Encounter (Signed)
Routing to Dr Harrison 

## 2013-05-14 ENCOUNTER — Other Ambulatory Visit: Payer: Self-pay | Admitting: *Deleted

## 2013-05-14 DIAGNOSIS — Z96659 Presence of unspecified artificial knee joint: Secondary | ICD-10-CM

## 2013-05-14 MED ORDER — HYDROCODONE-ACETAMINOPHEN 7.5-325 MG PO TABS: 1.0000 | ORAL_TABLET | ORAL | Status: DC | PRN

## 2013-05-14 NOTE — Telephone Encounter (Signed)
Yes

## 2013-05-14 NOTE — Telephone Encounter (Signed)
Prescription picked up by the patient °

## 2013-05-26 ENCOUNTER — Telehealth: Payer: Self-pay | Admitting: Orthopedic Surgery

## 2013-05-27 ENCOUNTER — Other Ambulatory Visit: Payer: Self-pay | Admitting: *Deleted

## 2013-05-27 DIAGNOSIS — Z96659 Presence of unspecified artificial knee joint: Secondary | ICD-10-CM

## 2013-05-27 MED ORDER — HYDROCODONE-ACETAMINOPHEN 7.5-325 MG PO TABS: 1.0000 | ORAL_TABLET | ORAL | Status: DC | PRN

## 2013-05-27 NOTE — Telephone Encounter (Signed)
REFILL 

## 2013-05-27 NOTE — Telephone Encounter (Signed)
Advised patient prescription was ready for pick up.

## 2013-05-28 ENCOUNTER — Ambulatory Visit (INDEPENDENT_AMBULATORY_CARE_PROVIDER_SITE_OTHER): Payer: BC Managed Care – PPO | Admitting: *Deleted

## 2013-05-28 DIAGNOSIS — I4891 Unspecified atrial fibrillation: Secondary | ICD-10-CM

## 2013-05-28 DIAGNOSIS — G459 Transient cerebral ischemic attack, unspecified: Secondary | ICD-10-CM

## 2013-05-28 DIAGNOSIS — Z7901 Long term (current) use of anticoagulants: Secondary | ICD-10-CM

## 2013-05-28 DIAGNOSIS — Z5181 Encounter for therapeutic drug level monitoring: Secondary | ICD-10-CM

## 2013-05-28 LAB — POCT INR: INR: 2.9

## 2013-06-05 ENCOUNTER — Telehealth: Payer: Self-pay | Admitting: Physician Assistant

## 2013-06-05 ENCOUNTER — Telehealth: Payer: Self-pay | Admitting: Orthopedic Surgery

## 2013-06-05 MED ORDER — CARVEDILOL 3.125 MG PO TABS: 3.1250 mg | ORAL_TABLET | Freq: Two times a day (BID) | ORAL | Status: DC

## 2013-06-05 NOTE — Telephone Encounter (Signed)
Received fax refill request  Rx # [id number] Medication:  Carvedilol 3.125 mg tab Qty 60 Sig:  Take one tablet by mouth twice daily Physician:  Suezanne Cheshire

## 2013-06-05 NOTE — Telephone Encounter (Signed)
Patient called to request refill of pain medication: Hydrocodone/Norco 7.5/325, last requested and refilled 05/27/13.  Patient's next scheduled appointment here is 07/07/13.  His ph# is [phone].

## 2013-06-05 NOTE — Telephone Encounter (Signed)
rx sent to pharmacy by e-script  

## 2013-06-08 ENCOUNTER — Other Ambulatory Visit: Payer: Self-pay | Admitting: *Deleted

## 2013-06-08 DIAGNOSIS — Z96659 Presence of unspecified artificial knee joint: Secondary | ICD-10-CM

## 2013-06-08 MED ORDER — HYDROCODONE-ACETAMINOPHEN 7.5-325 MG PO TABS: 1.0000 | ORAL_TABLET | ORAL | Status: DC | PRN

## 2013-06-08 NOTE — Telephone Encounter (Signed)
Refilled per Dr. Harrison and called patient to let him know prescription was ready to be picked up. 

## 2013-06-08 NOTE — Telephone Encounter (Signed)
Routing to Dr Harrison 

## 2013-06-08 NOTE — Telephone Encounter (Signed)
refiil 90

## 2013-06-22 ENCOUNTER — Telehealth: Payer: Self-pay | Admitting: Orthopedic Surgery

## 2013-06-22 NOTE — Telephone Encounter (Signed)
Routing to Dr Harrison 

## 2013-06-22 NOTE — Telephone Encounter (Signed)
Patient called to request refill on his pain medication Hydrocodone/Norco 7.5/325; his next scheduled appointment here is 07/07/13. (I am not locating this medication on patient's medication list). His phone# is [phone].

## 2013-06-23 ENCOUNTER — Other Ambulatory Visit: Payer: Self-pay | Admitting: *Deleted

## 2013-06-23 DIAGNOSIS — Z96659 Presence of unspecified artificial knee joint: Secondary | ICD-10-CM

## 2013-06-23 MED ORDER — HYDROCODONE-ACETAMINOPHEN 7.5-325 MG PO TABS: 1.0000 | ORAL_TABLET | ORAL | Status: DC | PRN

## 2013-06-23 NOTE — Telephone Encounter (Signed)
It was myself, from front office/non-clinical view, that I was not able to see this medication in the chart. Patient was notified; aware the prescription is ready for pickup.

## 2013-06-23 NOTE — Telephone Encounter (Signed)
Its in there you are looking in wrong place   Refill medication each time he calls until further notice   Refill 90 each time

## 2013-06-25 ENCOUNTER — Other Ambulatory Visit: Payer: Self-pay | Admitting: Cardiology

## 2013-06-26 LAB — HEPATIC FUNCTION PANEL
ALT: 15 U/L (ref 0–53)
AST: 24 U/L (ref 0–37)
Albumin: 4.4 g/dL (ref 3.5–5.2)
Alkaline Phosphatase: 92 U/L (ref 39–117)
Bilirubin, Direct: 0.1 mg/dL (ref 0.0–0.3)
Indirect Bilirubin: 0.3 mg/dL (ref 0.2–1.2)
Total Bilirubin: 0.4 mg/dL (ref 0.2–1.2)
Total Protein: 7.4 g/dL (ref 6.0–8.3)

## 2013-06-26 LAB — TSH: TSH: 1.915 u[IU]/mL (ref 0.350–4.500)

## 2013-07-06 ENCOUNTER — Telehealth: Payer: Self-pay | Admitting: Orthopedic Surgery

## 2013-07-06 NOTE — Telephone Encounter (Signed)
Patient will have to get at appointment, due to Dr. Romeo Apple has left for the day.

## 2013-07-06 NOTE — Telephone Encounter (Signed)
Call received from patient, requests refill, pain medication, Hydrocodone(Norco).  He has a scheduled appointment tomorrow, 07/07/13, 11:45.  Patient's ph# is [phone].

## 2013-07-07 ENCOUNTER — Ambulatory Visit (INDEPENDENT_AMBULATORY_CARE_PROVIDER_SITE_OTHER): Payer: BC Managed Care – PPO | Admitting: Orthopedic Surgery

## 2013-07-07 VITALS — BP 148/100 | Ht 71.0 in | Wt 224.0 lb

## 2013-07-07 DIAGNOSIS — Z96659 Presence of unspecified artificial knee joint: Secondary | ICD-10-CM

## 2013-07-07 MED ORDER — HYDROCODONE-ACETAMINOPHEN 7.5-325 MG PO TABS: 1.0000 | ORAL_TABLET | ORAL | Status: DC | PRN

## 2013-07-07 NOTE — Progress Notes (Signed)
Patient ID: David [name], male   DOB: Aug 17, 1960, 53 y.o.   MRN: [id number]  Chief Complaint  Patient presents with  . Follow-up    3 month recheck Left TKA DOS 01/16/13    Encounter Diagnosis  Name Primary?  . S/P knee replacement Yes    BP 148/100  Ht 5\' 11"  (1.803 m)  Wt 224 lb (101.606 kg)  BMI 31.26 kg/m2  5 months post knee replacement doing well no major complaints other than some lateral incision numbness  Review of systems negative  Gait has returned to near normal vitals are stable General appearance is normal, the patient is alert and oriented x3 with normal mood and affect. Knee flexion 120 knee extension full strength normal skin intact incision healed Some decreased sensation along the lateral edge of the incision  Doing well post total knee  X-ray 1 year  Meds ordered this encounter  Medications  . HYDROcodone-acetaminophen (NORCO) 7.5-325 MG per tablet    Sig: Take 1 tablet by mouth every 4 (four) hours as needed for moderate pain.    Dispense:  90 tablet    Refill:  0

## 2013-07-09 ENCOUNTER — Encounter: Payer: Self-pay | Admitting: *Deleted

## 2013-07-15 ENCOUNTER — Ambulatory Visit (INDEPENDENT_AMBULATORY_CARE_PROVIDER_SITE_OTHER): Payer: BC Managed Care – PPO | Admitting: *Deleted

## 2013-07-15 DIAGNOSIS — G459 Transient cerebral ischemic attack, unspecified: Secondary | ICD-10-CM

## 2013-07-15 DIAGNOSIS — I4891 Unspecified atrial fibrillation: Secondary | ICD-10-CM

## 2013-07-15 DIAGNOSIS — Z7901 Long term (current) use of anticoagulants: Secondary | ICD-10-CM

## 2013-07-15 DIAGNOSIS — Z5181 Encounter for therapeutic drug level monitoring: Secondary | ICD-10-CM

## 2013-07-15 LAB — POCT INR: INR: 1.8

## 2013-07-20 ENCOUNTER — Telehealth: Payer: Self-pay | Admitting: Orthopedic Surgery

## 2013-07-20 ENCOUNTER — Other Ambulatory Visit: Payer: Self-pay | Admitting: *Deleted

## 2013-07-20 DIAGNOSIS — Z96659 Presence of unspecified artificial knee joint: Secondary | ICD-10-CM

## 2013-07-20 MED ORDER — HYDROCODONE-ACETAMINOPHEN 7.5-325 MG PO TABS: 1.0000 | ORAL_TABLET | ORAL | Status: DC | PRN

## 2013-07-20 NOTE — Telephone Encounter (Signed)
David [name] wants a prescription for Hydrocodone 7.5/325  His # [phone]

## 2013-07-20 NOTE — Telephone Encounter (Signed)
Refilled per Dr. Harrison 

## 2013-07-21 NOTE — Telephone Encounter (Signed)
Patient picked up prescription.

## 2013-07-28 ENCOUNTER — Telehealth: Payer: Self-pay

## 2013-07-28 MED ORDER — AMIODARONE HCL 100 MG PO TABS: 100.0000 mg | ORAL_TABLET | Freq: Every day | ORAL | Status: DC

## 2013-07-28 NOTE — Telephone Encounter (Signed)
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

## 2013-07-31 ENCOUNTER — Ambulatory Visit: Payer: BC Managed Care – PPO | Admitting: Family Medicine

## 2013-08-04 ENCOUNTER — Other Ambulatory Visit: Payer: Self-pay | Admitting: *Deleted

## 2013-08-04 ENCOUNTER — Telehealth: Payer: Self-pay | Admitting: Orthopedic Surgery

## 2013-08-04 DIAGNOSIS — Z96659 Presence of unspecified artificial knee joint: Secondary | ICD-10-CM

## 2013-08-04 MED ORDER — HYDROCODONE-ACETAMINOPHEN 7.5-325 MG PO TABS
1.0000 | ORAL_TABLET | ORAL | Status: DC | PRN
Start: 2013-08-04 — End: 2013-08-17

## 2013-08-04 NOTE — Telephone Encounter (Signed)
Refilled per Dr. Romeo Apple, and patient picked up prescription 08/04/13.

## 2013-08-04 NOTE — Telephone Encounter (Signed)
Markley Mujica wants a new prescription for Hydrocodone 7.5/325  His phone # [phone]

## 2013-08-10 ENCOUNTER — Ambulatory Visit (INDEPENDENT_AMBULATORY_CARE_PROVIDER_SITE_OTHER): Payer: BC Managed Care – PPO | Admitting: *Deleted

## 2013-08-10 DIAGNOSIS — I4891 Unspecified atrial fibrillation: Secondary | ICD-10-CM

## 2013-08-10 DIAGNOSIS — Z7901 Long term (current) use of anticoagulants: Secondary | ICD-10-CM

## 2013-08-10 DIAGNOSIS — G459 Transient cerebral ischemic attack, unspecified: Secondary | ICD-10-CM

## 2013-08-10 DIAGNOSIS — Z5181 Encounter for therapeutic drug level monitoring: Secondary | ICD-10-CM

## 2013-08-10 LAB — POCT INR: INR: 4.4

## 2013-08-17 ENCOUNTER — Other Ambulatory Visit: Payer: Self-pay | Admitting: *Deleted

## 2013-08-17 ENCOUNTER — Telehealth: Payer: Self-pay | Admitting: Orthopedic Surgery

## 2013-08-17 DIAGNOSIS — Z96659 Presence of unspecified artificial knee joint: Secondary | ICD-10-CM

## 2013-08-17 MED ORDER — HYDROCODONE-ACETAMINOPHEN 7.5-325 MG PO TABS: 1.0000 | ORAL_TABLET | ORAL | Status: DC | PRN

## 2013-08-17 NOTE — Telephone Encounter (Signed)
Patient called to request refill pain medication, Hydrocodone 7.5/325. He is next scheduled 02/04/14.  His ph# is [phone]

## 2013-08-17 NOTE — Telephone Encounter (Signed)
Refilled per Dr. Romeo Apple and patient made aware prescription would be ready for pick up 08/18/13

## 2013-08-26 ENCOUNTER — Ambulatory Visit (INDEPENDENT_AMBULATORY_CARE_PROVIDER_SITE_OTHER): Payer: BC Managed Care – PPO | Admitting: Cardiology

## 2013-08-26 ENCOUNTER — Encounter: Payer: Self-pay | Admitting: Cardiology

## 2013-08-26 ENCOUNTER — Ambulatory Visit (INDEPENDENT_AMBULATORY_CARE_PROVIDER_SITE_OTHER): Payer: BC Managed Care – PPO | Admitting: *Deleted

## 2013-08-26 VITALS — BP 136/72 | HR 68 | Ht 71.0 in | Wt 228.0 lb

## 2013-08-26 DIAGNOSIS — G459 Transient cerebral ischemic attack, unspecified: Secondary | ICD-10-CM

## 2013-08-26 DIAGNOSIS — I429 Cardiomyopathy, unspecified: Secondary | ICD-10-CM

## 2013-08-26 DIAGNOSIS — I4891 Unspecified atrial fibrillation: Secondary | ICD-10-CM

## 2013-08-26 DIAGNOSIS — Z7901 Long term (current) use of anticoagulants: Secondary | ICD-10-CM

## 2013-08-26 DIAGNOSIS — Z5181 Encounter for therapeutic drug level monitoring: Secondary | ICD-10-CM

## 2013-08-26 DIAGNOSIS — Z79899 Other long term (current) drug therapy: Secondary | ICD-10-CM

## 2013-08-26 LAB — POCT INR: INR: 4.1

## 2013-08-26 NOTE — Patient Instructions (Addendum)
Your physician wants you to follow-up in: 6 months You will receive a reminder letter in the mail two months in advance. If you don't receive a letter, please call our office to schedule the follow-up appointment. PLEASE GET BLOOD WORK JUST BEFORE NEXT VISIT    Your physician recommends that you continue on your current medications as directed. Please refer to the Current Medication list given to you today.      Thank you for choosing  [facility] !

## 2013-08-26 NOTE — Assessment & Plan Note (Signed)
LVEF approximately 50% by echocardiogram in May 2013. Continue medical therapy.

## 2013-08-26 NOTE — Progress Notes (Signed)
Clinical Summary David [name] is a 53 y.o.male last seen in December 2014. He has been stable from a cardiac perspective, no palpitations or progressive shortness of breath. He reports compliance with his medications.  Lab work from March of this year showed normal LFTs and TSH.  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  Allergies  Allergen Reactions  . Azithromycin Itching    Current Outpatient Prescriptions  Medication Sig Dispense Refill  . allopurinol (ZYLOPRIM) 300 MG tablet Take 300 mg by mouth daily.       Marland Kitchen amiodarone (PACERONE) 100 MG tablet Take 1 tablet (100 mg total) by mouth daily.  90 tablet  3  . carvedilol (COREG) 3.125 MG tablet TAKE ONE TABLET BY MOUTH TWICE DAILY  60 tablet  0  . carvedilol (COREG) 3.125 MG tablet Take 1 tablet (3.125 mg total) by mouth 2 (two) times daily with a meal.  60 tablet  3  . famotidine (PEPCID) 20 MG tablet Take 1 tablet (20 mg total) by mouth daily.  30 tablet  6  . furosemide (LASIX) 40 MG tablet Take 0.5 tablets (20 mg total) by mouth every morning.  30 tablet  5  . KLOR-CON M10 10 MEQ tablet TAKE ONE TABLET BY MOUTH ONCE DAILY IN THE MORNING  30 tablet  6  . lisinopril (PRINIVIL,ZESTRIL) 5 MG tablet Take 1 tablet (5 mg total) by mouth daily.  30 tablet  6  . polyethylene glycol (MIRALAX / GLYCOLAX) packet Take 17 g by mouth daily.  14 each  0  . warfarin (COUMADIN) 5 MG tablet Take 1 tablet daily  45 tablet  3   No current facility-administered medications for this visit.    Past Medical History  Diagnosis Date  . Atrial fibrillation   . Alcohol abuse   . TIA (transient ischemic attack)   . Nonischemic cardiomyopathy     LVEF 20-25% improved to 50% on medical therapy  . Chronic  kidney disease, stage 2, mildly decreased GFR   . GERD (gastroesophageal reflux disease)   . Arthritis     Bilateral knees   . Stroke     states ministroke about 06/2011. No deficits  . Gout     Social History David [name] reports that he quit smoking about 3 years ago. His smoking use included Cigarettes. He smoked 0.00 packs per day. He does not have any smokeless tobacco history on file. David [name] reports that he drinks about .6 ounces of alcohol per week.  Review of Systems States that his left knee is better after surgery, increased range of motion. He still has trouble with his right knee and may need surgery eventually. No bleeding problems on Coumadin. Stable appetite.  Physical Examination Filed Vitals:   08/26/13 0948  BP: 136/72  Pulse: 68   Filed Weights   08/26/13 0948  Weight: 228 lb (103.42 kg)    Appears comfortable at rest.  HEENT: Conjunctiva and lids normal, oropharynx clear.  Neck: Supple, no elevated JVP or carotid bruits, no thyromegaly.  Lungs: Clear to auscultation, nonlabored breathing at rest.  Cardiac:RRR, no S3 or significant systolic murmur, no pericardial rub.  Abdomen: Soft, nontender, bowel sounds present, no guarding or rebound.  Extremities: No pitting edema, distal pulses 2+.    Problem List and Plan  Atrial fibrillation Well controlled, regular rate and rhythm today. Plan will be to continue current regimen including low-dose amiodarone and Coumadin. Followup TSH and LFTs were normal. Followup in 6 months with repeat lab work.  Secondary cardiomyopathy, unspecified LVEF approximately 50% by echocardiogram in May 2013. Continue medical therapy.    Jonelle Sidle, M.D., F.A.C.C.

## 2013-08-26 NOTE — Assessment & Plan Note (Signed)
Well controlled, regular rate and rhythm today. Plan will be to continue current regimen including low-dose amiodarone and Coumadin. Followup TSH and LFTs were normal. Followup in 6 months with repeat lab work.

## 2013-09-01 ENCOUNTER — Other Ambulatory Visit: Payer: Self-pay | Admitting: *Deleted

## 2013-09-01 ENCOUNTER — Telehealth: Payer: Self-pay | Admitting: Orthopedic Surgery

## 2013-09-01 DIAGNOSIS — Z96659 Presence of unspecified artificial knee joint: Secondary | ICD-10-CM

## 2013-09-01 MED ORDER — FAMOTIDINE 20 MG PO TABS: 20.0000 mg | ORAL_TABLET | Freq: Every day | ORAL | Status: DC

## 2013-09-01 MED ORDER — HYDROCODONE-ACETAMINOPHEN 7.5-325 MG PO TABS: 1.0000 | ORAL_TABLET | ORAL | Status: DC | PRN

## 2013-09-01 NOTE — Telephone Encounter (Signed)
Refilled per Dr. Romeo Apple and patient aware that it is ready for pick up.

## 2013-09-01 NOTE — Telephone Encounter (Signed)
Medication filled x1 with no refills.   Requires office visit before any further refills can be given.  

## 2013-09-01 NOTE — Telephone Encounter (Signed)
Patient called for refill of pain medication, Hydrocodone 7.5/325. He is next scheduled 02/04/14. His ph# is [phone].

## 2013-09-02 NOTE — Telephone Encounter (Signed)
Patient picked up today, 09/02/13.

## 2013-09-15 ENCOUNTER — Telehealth: Payer: Self-pay | Admitting: Orthopedic Surgery

## 2013-09-15 ENCOUNTER — Telehealth: Payer: Self-pay | Admitting: Cardiology

## 2013-09-15 ENCOUNTER — Other Ambulatory Visit: Payer: Self-pay | Admitting: *Deleted

## 2013-09-15 DIAGNOSIS — Z96659 Presence of unspecified artificial knee joint: Secondary | ICD-10-CM

## 2013-09-15 MED ORDER — HYDROCODONE-ACETAMINOPHEN 7.5-325 MG PO TABS: 1.0000 | ORAL_TABLET | ORAL | Status: AC | PRN

## 2013-09-15 NOTE — Telephone Encounter (Signed)
Refilled per Dr.Harrison and patient notified to pick up prescription.

## 2013-09-15 NOTE — Telephone Encounter (Signed)
Patient called and requested a refill on Hydrocodone 7.5.  Patient phone is [phone]

## 2013-09-15 NOTE — Telephone Encounter (Signed)
Please return patient's call regarding weight gain / tgs

## 2013-09-15 NOTE — Telephone Encounter (Signed)
Pt has a nurse from [facility] that calls once a month to check on the pt. Pt states he usually weighs 215 lb. Pt weighed 228 lb on our scales. I advised to purchase a scale or come by our office to weigh again to see if that was accurate. Pt will call back once he weighs again.

## 2013-09-21 ENCOUNTER — Ambulatory Visit (INDEPENDENT_AMBULATORY_CARE_PROVIDER_SITE_OTHER): Payer: BC Managed Care – PPO | Admitting: *Deleted

## 2013-09-21 VITALS — Wt 225.1 lb

## 2013-09-21 DIAGNOSIS — G459 Transient cerebral ischemic attack, unspecified: Secondary | ICD-10-CM

## 2013-09-21 DIAGNOSIS — I4891 Unspecified atrial fibrillation: Secondary | ICD-10-CM

## 2013-09-21 DIAGNOSIS — Z7901 Long term (current) use of anticoagulants: Secondary | ICD-10-CM

## 2013-09-21 DIAGNOSIS — Z5181 Encounter for therapeutic drug level monitoring: Secondary | ICD-10-CM

## 2013-09-21 LAB — POCT INR: INR: 2

## 2013-09-29 ENCOUNTER — Telehealth: Payer: Self-pay | Admitting: Orthopedic Surgery

## 2013-09-29 NOTE — Telephone Encounter (Addendum)
Patient called and requested a refill on hydrocodone, patient stated he was told by Dr. Romeo Apple that he would up the dosage. Patient phone is [phone]

## 2013-09-30 NOTE — Telephone Encounter (Signed)
Patient called into office this morning for pain med refill. Advised patient Dr Romeo Apple out of office until 10/05/13 and would refill upon return.

## 2013-10-05 ENCOUNTER — Other Ambulatory Visit: Payer: Self-pay | Admitting: Orthopedic Surgery

## 2013-10-05 ENCOUNTER — Telehealth: Payer: Self-pay | Admitting: Orthopedic Surgery

## 2013-10-05 MED ORDER — MORPHINE SULFATE ER 15 MG PO TBCR: 15.0000 mg | EXTENDED_RELEASE_TABLET | Freq: Two times a day (BID) | ORAL | Status: DC

## 2013-10-05 NOTE — Telephone Encounter (Signed)
Routing to DR. Harrison  

## 2013-10-05 NOTE — Telephone Encounter (Signed)
Patient called to relay that he has taken one dosage of the new pain medication, Morphine, as prescribed, and states he is feeling like it is too strong. States he had slept most of day.  Please advise.  Ph# [phone]

## 2013-10-06 ENCOUNTER — Other Ambulatory Visit: Payer: Self-pay | Admitting: Cardiology

## 2013-10-06 ENCOUNTER — Other Ambulatory Visit: Payer: Self-pay | Admitting: Family Medicine

## 2013-10-06 ENCOUNTER — Other Ambulatory Visit: Payer: Self-pay | Admitting: Orthopedic Surgery

## 2013-10-06 NOTE — Telephone Encounter (Signed)
The first doses may be like that until he gets used to it  Please try for 1 week

## 2013-10-06 NOTE — Telephone Encounter (Signed)
Refill denied.   Requires office visit before any further refills can be given.   Letter sent.  

## 2013-10-06 NOTE — Telephone Encounter (Signed)
Notified the patient. 

## 2013-10-09 ENCOUNTER — Other Ambulatory Visit: Payer: Self-pay | Admitting: Family Medicine

## 2013-10-12 ENCOUNTER — Ambulatory Visit (INDEPENDENT_AMBULATORY_CARE_PROVIDER_SITE_OTHER): Payer: BC Managed Care – PPO | Admitting: *Deleted

## 2013-10-12 DIAGNOSIS — Z5181 Encounter for therapeutic drug level monitoring: Secondary | ICD-10-CM

## 2013-10-12 DIAGNOSIS — I4891 Unspecified atrial fibrillation: Secondary | ICD-10-CM

## 2013-10-12 DIAGNOSIS — Z7901 Long term (current) use of anticoagulants: Secondary | ICD-10-CM

## 2013-10-12 DIAGNOSIS — G459 Transient cerebral ischemic attack, unspecified: Secondary | ICD-10-CM

## 2013-10-12 LAB — POCT INR: INR: 1.4

## 2013-10-16 ENCOUNTER — Telehealth: Payer: Self-pay | Admitting: Orthopedic Surgery

## 2013-10-16 NOTE — Telephone Encounter (Signed)
Patient called to request refill, pain medication.  Most recent medication, per 10/05/13 request: morphine (MS CONTIN) 15 MG 12 hr tablet [[id number]] Ph# is [phone].

## 2013-10-16 NOTE — Telephone Encounter (Signed)
Routing to Dr Harrison 

## 2013-10-17 ENCOUNTER — Other Ambulatory Visit: Payer: Self-pay | Admitting: Orthopedic Surgery

## 2013-10-17 MED ORDER — MORPHINE SULFATE ER 15 MG PO TBCR: 15.0000 mg | EXTENDED_RELEASE_TABLET | Freq: Two times a day (BID) | ORAL | Status: DC

## 2013-10-26 ENCOUNTER — Ambulatory Visit (INDEPENDENT_AMBULATORY_CARE_PROVIDER_SITE_OTHER): Payer: BC Managed Care – PPO | Admitting: *Deleted

## 2013-10-26 DIAGNOSIS — I4891 Unspecified atrial fibrillation: Secondary | ICD-10-CM

## 2013-10-26 DIAGNOSIS — G459 Transient cerebral ischemic attack, unspecified: Secondary | ICD-10-CM

## 2013-10-26 DIAGNOSIS — Z7901 Long term (current) use of anticoagulants: Secondary | ICD-10-CM

## 2013-10-26 DIAGNOSIS — Z5181 Encounter for therapeutic drug level monitoring: Secondary | ICD-10-CM

## 2013-10-26 LAB — POCT INR: INR: 2.2

## 2013-11-16 ENCOUNTER — Telehealth: Payer: Self-pay | Admitting: Orthopedic Surgery

## 2013-11-16 ENCOUNTER — Other Ambulatory Visit: Payer: Self-pay | Admitting: *Deleted

## 2013-11-16 MED ORDER — MORPHINE SULFATE ER 15 MG PO TBCR: 15.0000 mg | EXTENDED_RELEASE_TABLET | Freq: Two times a day (BID) | ORAL | Status: DC

## 2013-11-16 NOTE — Telephone Encounter (Signed)
Prescription available for pick up, called patient, no answer 

## 2013-11-16 NOTE — Telephone Encounter (Signed)
Patient came in today, picked up prescription.

## 2013-11-16 NOTE — Telephone Encounter (Signed)
Patient called to request medication refill: Morphine MS Contin 15 mg - his phone # is [phone].

## 2013-11-23 ENCOUNTER — Ambulatory Visit (INDEPENDENT_AMBULATORY_CARE_PROVIDER_SITE_OTHER): Payer: BC Managed Care – PPO | Admitting: *Deleted

## 2013-11-23 DIAGNOSIS — Z7901 Long term (current) use of anticoagulants: Secondary | ICD-10-CM

## 2013-11-23 DIAGNOSIS — G459 Transient cerebral ischemic attack, unspecified: Secondary | ICD-10-CM

## 2013-11-23 DIAGNOSIS — I4891 Unspecified atrial fibrillation: Secondary | ICD-10-CM

## 2013-11-23 DIAGNOSIS — Z5181 Encounter for therapeutic drug level monitoring: Secondary | ICD-10-CM

## 2013-11-23 LAB — POCT INR: INR: 2.7

## 2013-11-25 ENCOUNTER — Other Ambulatory Visit: Payer: Self-pay | Admitting: Cardiology

## 2013-12-02 ENCOUNTER — Other Ambulatory Visit: Payer: Self-pay | Admitting: Cardiology

## 2013-12-05 ENCOUNTER — Emergency Department (HOSPITAL_COMMUNITY): Payer: BC Managed Care – PPO

## 2013-12-05 ENCOUNTER — Encounter (HOSPITAL_COMMUNITY): Payer: Self-pay | Admitting: Emergency Medicine

## 2013-12-05 ENCOUNTER — Emergency Department (HOSPITAL_COMMUNITY)
Admission: EM | Admit: 2013-12-05 | Discharge: 2013-12-06 | Disposition: A | Discharge status: Home / Self Care | Payer: BC Managed Care – PPO | Attending: Emergency Medicine | Admitting: Emergency Medicine

## 2013-12-05 DIAGNOSIS — Z79899 Other long term (current) drug therapy: Secondary | ICD-10-CM | POA: Diagnosis not present

## 2013-12-05 DIAGNOSIS — IMO0002 Reserved for concepts with insufficient information to code with codable children: Secondary | ICD-10-CM | POA: Insufficient documentation

## 2013-12-05 DIAGNOSIS — Z87891 Personal history of nicotine dependence: Secondary | ICD-10-CM | POA: Diagnosis not present

## 2013-12-05 DIAGNOSIS — I4891 Unspecified atrial fibrillation: Secondary | ICD-10-CM | POA: Insufficient documentation

## 2013-12-05 DIAGNOSIS — Z8673 Personal history of transient ischemic attack (TIA), and cerebral infarction without residual deficits: Secondary | ICD-10-CM | POA: Insufficient documentation

## 2013-12-05 DIAGNOSIS — Z7901 Long term (current) use of anticoagulants: Secondary | ICD-10-CM | POA: Insufficient documentation

## 2013-12-05 DIAGNOSIS — Y9389 Activity, other specified: Secondary | ICD-10-CM | POA: Diagnosis not present

## 2013-12-05 DIAGNOSIS — Y929 Unspecified place or not applicable: Secondary | ICD-10-CM | POA: Diagnosis not present

## 2013-12-05 DIAGNOSIS — S0993XA Unspecified injury of face, initial encounter: Secondary | ICD-10-CM | POA: Diagnosis not present

## 2013-12-05 DIAGNOSIS — W108XXA Fall (on) (from) other stairs and steps, initial encounter: Secondary | ICD-10-CM | POA: Insufficient documentation

## 2013-12-05 DIAGNOSIS — S0990XA Unspecified injury of head, initial encounter: Secondary | ICD-10-CM

## 2013-12-05 DIAGNOSIS — Z8739 Personal history of other diseases of the musculoskeletal system and connective tissue: Secondary | ICD-10-CM | POA: Diagnosis not present

## 2013-12-05 DIAGNOSIS — M109 Gout, unspecified: Secondary | ICD-10-CM | POA: Diagnosis not present

## 2013-12-05 DIAGNOSIS — N182 Chronic kidney disease, stage 2 (mild): Secondary | ICD-10-CM | POA: Insufficient documentation

## 2013-12-05 DIAGNOSIS — Z8719 Personal history of other diseases of the digestive system: Secondary | ICD-10-CM | POA: Insufficient documentation

## 2013-12-05 DIAGNOSIS — S199XXA Unspecified injury of neck, initial encounter: Secondary | ICD-10-CM

## 2013-12-05 LAB — CBC WITH DIFFERENTIAL/PLATELET
Basophils Absolute: 0 10*3/uL (ref 0.0–0.1)
Basophils Relative: 0 % (ref 0–1)
Eosinophils Absolute: 0.1 10*3/uL (ref 0.0–0.7)
Eosinophils Relative: 2 % (ref 0–5)
HCT: 34.2 % — ABNORMAL LOW (ref 39.0–52.0)
Hemoglobin: 11.3 g/dL — ABNORMAL LOW (ref 13.0–17.0)
Lymphocytes Relative: 33 % (ref 12–46)
Lymphs Abs: 2.2 10*3/uL (ref 0.7–4.0)
MCH: 28.8 pg (ref 26.0–34.0)
MCHC: 33 g/dL (ref 30.0–36.0)
MCV: 87.2 fL (ref 78.0–100.0)
Monocytes Absolute: 0.8 10*3/uL (ref 0.1–1.0)
Monocytes Relative: 11 % (ref 3–12)
Neutro Abs: 3.7 10*3/uL (ref 1.7–7.7)
Neutrophils Relative %: 54 % (ref 43–77)
Platelets: 292 10*3/uL (ref 150–400)
RBC: 3.92 MIL/uL — ABNORMAL LOW (ref 4.22–5.81)
RDW: 15 % (ref 11.5–15.5)
WBC: 6.8 10*3/uL (ref 4.0–10.5)

## 2013-12-05 LAB — COMPREHENSIVE METABOLIC PANEL
ALT: 27 U/L (ref 0–53)
AST: 29 U/L (ref 0–37)
Albumin: 3.9 g/dL (ref 3.5–5.2)
Alkaline Phosphatase: 92 U/L (ref 39–117)
Anion gap: 15 (ref 5–15)
BUN: 10 mg/dL (ref 6–23)
CO2: 24 mEq/L (ref 19–32)
Calcium: 8.7 mg/dL (ref 8.4–10.5)
Chloride: 95 mEq/L — ABNORMAL LOW (ref 96–112)
Creatinine, Ser: 1.24 mg/dL (ref 0.50–1.35)
GFR calc Af Amer: 76 mL/min — ABNORMAL LOW (ref >90)
GFR calc non Af Amer: 66 mL/min — ABNORMAL LOW (ref >90)
Glucose, Bld: 99 mg/dL (ref 70–99)
Potassium: 3.5 mEq/L — ABNORMAL LOW (ref 3.7–5.3)
Sodium: 134 mEq/L — ABNORMAL LOW (ref 137–147)
Total Bilirubin: 0.2 mg/dL — ABNORMAL LOW (ref 0.3–1.2)
Total Protein: 7.5 g/dL (ref 6.0–8.3)

## 2013-12-05 LAB — ETHANOL: Alcohol, Ethyl (B): 263 mg/dL — ABNORMAL HIGH (ref 0–11)

## 2013-12-05 LAB — PROTIME-INR
INR: 2.08 — ABNORMAL HIGH (ref 0.00–1.49)
Prothrombin Time: 23.4 seconds — ABNORMAL HIGH (ref 11.6–15.2)

## 2013-12-05 MED ORDER — SODIUM CHLORIDE 0.9 % IV BOLUS (SEPSIS)
500.0000 mL | Freq: Once | INTRAVENOUS | Status: AC
  Administered 2013-12-05: 23:00:00 via INTRAVENOUS

## 2013-12-05 MED ORDER — IBUPROFEN 800 MG PO TABS: 800.0000 mg | ORAL_TABLET | Freq: Three times a day (TID) | ORAL | Status: DC | PRN

## 2013-12-05 NOTE — Discharge Instructions (Signed)
Follow up with your md Monday or Tuesday for recheck °

## 2013-12-05 NOTE — ED Notes (Signed)
Pt has gone over to CT

## 2013-12-05 NOTE — ED Provider Notes (Addendum)
CSN: [id number]     Arrival date & time 12/05/13  2051 History  This chart was scribed for [name], MD by Modena Jansky, ED Scribe. This patient was seen in room APA19/APA19 and the patient's care was started at 10:04 PM.  Chief Complaint  Patient presents with  . Fall   Patient is a 53 y.o. male presenting with fall. The history is provided by the patient. No language interpreter was used.  Fall This is a new problem. The current episode started 1 to 2 hours ago. The problem occurs constantly. The problem has not changed since onset.Associated symptoms include headaches. Pertinent negatives include no chest pain and no abdominal pain. Nothing aggravates the symptoms. Nothing relieves the symptoms.   HPI Comments: David [name] is a 53 y.o. male who presents to the Emergency Department complaining of a fall that occurred today. He states that he fell backwards off a set of stairs about 4 ft high. He reports that he landed in scrubs and pavement. He denies any LOC. He reports that he has lacerations to the back, and the right side of his head. He also reports that he has an abrasion and knot to his right forehead. He states that he has headache, neck pain, and back pain. He reports that he is currently taking warfarin.   Past Medical History  Diagnosis Date  . Atrial fibrillation   . Alcohol abuse   . TIA (transient ischemic attack)   . Nonischemic cardiomyopathy     LVEF 20-25% improved to 50% on medical therapy  . Chronic kidney disease, stage 2, mildly decreased GFR   . GERD (gastroesophageal reflux disease)   . Arthritis     Bilateral knees   . Stroke     states ministroke about 06/2011. No deficits  . Gout    Past Surgical History  Procedure Laterality Date  . Arthroscopic knee surgery  1987    right  . Cardioversion  10/08/2011    Procedure: CARDIOVERSION;  Surgeon: Jonelle Sidle, MD;  Location: [facility];  Service: Cardiovascular;  Laterality: N/A;  Done in PACU   . Cardioversion  01/10/2012    Procedure: CARDIOVERSION;  Surgeon: Marinus Maw, MD;  Location: [facility];  Service: Cardiovascular;  Laterality: N/A;  . Total knee arthroplasty Left 01/16/2013    Procedure: TOTAL KNEE ARTHROPLASTY;  Surgeon: Vickki Hearing, MD;  Location: [facility];  Service: Orthopedics;  Laterality: Left;   Family History  Problem Relation Age of Onset  . Hypertension Mother   . Hypertension Sister   . Hypertension Brother    History  Substance Use Topics  . Smoking status: Former Smoker    Types: Cigarettes    Quit date: 01/12/2010  . Smokeless tobacco: Not on file  . Alcohol Use: 0.6 oz/week    1 Cans of beer per week     Comment: last beer 01/12/2013 but sts i am quitting    Review of Systems  Constitutional: Negative for appetite change and fatigue.  HENT: Negative for congestion, ear discharge and sinus pressure.   Eyes: Negative for discharge.  Respiratory: Negative for cough.   Cardiovascular: Negative for chest pain.  Gastrointestinal: Negative for abdominal pain and diarrhea.  Genitourinary: Negative for frequency and hematuria.  Musculoskeletal: Positive for back pain and neck pain.  Skin: Negative for rash.  Neurological: Positive for headaches. Negative for seizures and syncope.  Hematological: Bruises/bleeds easily.  Psychiatric/Behavioral: Negative for hallucinations.    Allergies  Azithromycin  Home Medications   Prior to Admission medications   Medication Sig Start Date End Date Taking? Authorizing Provider  allopurinol (ZYLOPRIM) 300 MG tablet Take 300 mg by mouth daily.  11/11/12   Historical Provider, MD  amiodarone (PACERONE) 100 MG tablet Take 1 tablet (100 mg total) by mouth daily. 07/28/13   Jonelle Sidle, MD  carvedilol (COREG) 3.125 MG tablet TAKE ONE TABLET BY MOUTH TWICE DAILY WITH  MEALS 10/06/13   Jonelle Sidle, MD  furosemide (LASIX) 40 MG tablet Take 0.5 tablets (20 mg total) by mouth every morning. 12/26/12  12/26/13  Jonelle Sidle, MD  KLOR-CON M10 10 MEQ tablet TAKE ONE TABLET BY MOUTH ONCE DAILY IN THE MORNING 11/25/13   Jonelle Sidle, MD  lisinopril (PRINIVIL,ZESTRIL) 5 MG tablet TAKE ONE TABLET BY MOUTH ONCE DAILY 12/03/13   Jonelle Sidle, MD  morphine (MS CONTIN) 15 MG 12 hr tablet Take 1 tablet (15 mg total) by mouth every 12 (twelve) hours. 11/16/13   Vickki Hearing, MD  polyethylene glycol [facility] Ethelene Hal) packet Take 17 g by mouth daily. 01/19/13   Vickki Hearing, MD  warfarin (COUMADIN) 5 MG tablet Take 1 tablet daily 06/25/13   Jonelle Sidle, MD   BP 133/88  Pulse 62  Temp(Src) 98 F (36.7 C) (Oral)  Ht 5\' 11"  (1.803 m)  Wt 234 lb (106.142 kg)  BMI 32.65 kg/m2  SpO2 99% Physical Exam  Nursing note and vitals reviewed. Constitutional: He is oriented to person, place, and time. He appears well-developed.  Smells of alcohol.   HENT:  Head: Normocephalic.  Abrasions and tenderness to right forehead.   Eyes: Conjunctivae and EOM are normal. No scleral icterus.  Neck: Neck supple. No thyromegaly present.  Cardiovascular: Normal rate and regular rhythm.  Exam reveals no gallop and no friction rub.   No murmur heard. Pulmonary/Chest: No stridor. He has no wheezes. He has no rales. He exhibits no tenderness.  Abdominal: He exhibits no distension. There is no tenderness. There is no rebound.  Musculoskeletal: Normal range of motion. He exhibits no edema and no tenderness.  Pelvis not tender  Lymphadenopathy:    He has no cervical adenopathy.  Neurological: He is oriented to person, place, and time. He exhibits normal muscle tone. Coordination normal.  Skin: No rash noted. No erythema.  Psychiatric: He has a normal mood and affect. His behavior is normal.    ED Course  Procedures (including critical care time) DIAGNOSTIC STUDIES: Oxygen Saturation is 99% on RA, normal by my interpretation.    COORDINATION OF CARE: 10:09 PM- Pt advised of plan for  treatment and pt agrees.  Labs Review Labs Reviewed - No data to display  Imaging Review No results found.   EKG Interpretation None      MDM   Final diagnoses:  None    Neg ct,  Pt to follow up with pcp Monday    The chart was scribed for me under my direct supervision.  I personally performed the history, physical, and medical decision making and all procedures in the evaluation of this patient.[name], MD 12/05/13 2354  [name], MD 12/06/13 2300

## 2013-12-05 NOTE — ED Notes (Addendum)
Pt states he fell backwards off a set of steps that were about 4 ft tall. Pt has a laceration to the back, right side of his head and an abrasions and knot to his right forehead. Pt c/o a headache, neck pain, and back pain. Pt placed in c-collar in triage

## 2013-12-08 ENCOUNTER — Other Ambulatory Visit: Payer: Self-pay | Admitting: Cardiology

## 2013-12-09 ENCOUNTER — Ambulatory Visit (INDEPENDENT_AMBULATORY_CARE_PROVIDER_SITE_OTHER): Payer: BC Managed Care – PPO | Admitting: Family Medicine

## 2013-12-09 ENCOUNTER — Encounter: Payer: Self-pay | Admitting: Family Medicine

## 2013-12-09 VITALS — BP 134/74 | HR 68 | Temp 98.6°F | Resp 16 | Ht 70.0 in | Wt 228.0 lb

## 2013-12-09 DIAGNOSIS — S161XXS Strain of muscle, fascia and tendon at neck level, sequela: Secondary | ICD-10-CM

## 2013-12-09 DIAGNOSIS — K219 Gastro-esophageal reflux disease without esophagitis: Secondary | ICD-10-CM

## 2013-12-09 DIAGNOSIS — I4891 Unspecified atrial fibrillation: Secondary | ICD-10-CM

## 2013-12-09 DIAGNOSIS — I480 Paroxysmal atrial fibrillation: Secondary | ICD-10-CM

## 2013-12-09 DIAGNOSIS — F1010 Alcohol abuse, uncomplicated: Secondary | ICD-10-CM

## 2013-12-09 DIAGNOSIS — S161XXA Strain of muscle, fascia and tendon at neck level, initial encounter: Secondary | ICD-10-CM | POA: Insufficient documentation

## 2013-12-09 DIAGNOSIS — M1710 Unilateral primary osteoarthritis, unspecified knee: Secondary | ICD-10-CM

## 2013-12-09 DIAGNOSIS — N182 Chronic kidney disease, stage 2 (mild): Secondary | ICD-10-CM

## 2013-12-09 DIAGNOSIS — Z79899 Other long term (current) drug therapy: Secondary | ICD-10-CM

## 2013-12-09 DIAGNOSIS — IMO0002 Reserved for concepts with insufficient information to code with codable children: Secondary | ICD-10-CM

## 2013-12-09 DIAGNOSIS — M1712 Unilateral primary osteoarthritis, left knee: Secondary | ICD-10-CM

## 2013-12-09 DIAGNOSIS — S0990XA Unspecified injury of head, initial encounter: Secondary | ICD-10-CM | POA: Insufficient documentation

## 2013-12-09 MED ORDER — FAMOTIDINE 20 MG PO TABS: 20.0000 mg | ORAL_TABLET | Freq: Every day | ORAL | Status: DC

## 2013-12-09 MED ORDER — METHOCARBAMOL 500 MG PO TABS: 500.0000 mg | ORAL_TABLET | Freq: Three times a day (TID) | ORAL | Status: DC | PRN

## 2013-12-09 NOTE — Patient Instructions (Addendum)
You need to avoid alcohol especially with your cardiac medications We will call with lab results Continue to ICE the knee Make an appointment with Dr. Romeo Apple for your knee  Use neosporin to abrasion on knee Muscle relaxer for neck F/U 3 months

## 2013-12-09 NOTE — Assessment & Plan Note (Signed)
Discussed importance of alcohol cessation and how his alcohol abuse interferes with his medications heart problems

## 2013-12-09 NOTE — Assessment & Plan Note (Signed)
Sinus rhythm his amiodarone was recently decreased due to abnormal pulmonology exam. I will check a TSH on him as well and repeat his metabolic panel his EKG was unremarkable

## 2013-12-09 NOTE — Assessment & Plan Note (Signed)
Strain more whiplash from hitting head, robaxin short term for spasm

## 2013-12-09 NOTE — Progress Notes (Signed)
Patient ID: David [name], male   DOB: 06/24/1960, 53 y.o.   MRN: [id number]   Subjective:    Patient ID: David [name], male    DOB: [birth year], 53 y.o.   MRN: [id number]  Patient presents for Hospital F/U  patient here for hospital followup. He was at a cookout on Saturday when he was going into the house he reached for the screen door he states that he missed it in his left leg buckled on him and he fell hitting his head on concrete steps he was seen in the emergency room as he had significant bleeding from a laceration in his head he also has swelling of the temporal region in the frontal area of his for head. His CT scan was unremarkable with the exception of soft tissue swelling his CT of neck showed some degenerative changes. He's had some stiffness in pain in his neck when he rotates back-and-forth. I reviewed his labs he had mild hyperkalemia 3.5 however his alcohol level was significantly elevated. His renal function was at baseline.  He is followed by cardiology secondary to cardiomyopathy as well as atrial fibrillation he is on Coumadin therapy which is at baseline. He has had some increase tightness in his chest he felt like it may have been acid reflux therefore he not go to the ER he was on Pepcid in the past but has been out of this medication. He denies any radiation of the chest pain.    Review Of Systems:  GEN- + fatigue, fever, weight loss,weakness, recent illness HEENT- denies eye drainage, change in vision, nasal discharge, CVS- +chest pain, palpitations RESP- denies SOB, cough, wheeze ABD- denies N/V, change in stools, abd pain GU- denies dysuria, hematuria, dribbling, incontinence MSK- +joint pain, muscle aches, injury Neuro- denies headache, dizziness, syncope, seizure activity       Objective:    BP 134/74  Pulse 68  Temp(Src) 98.6 F (37 C) (Oral)  Resp 16  Ht 5\' 10"  (1.778 m)  Wt 228 lb (103.42 kg)  BMI 32.71 kg/m2 GEN- NAD, alert and  oriented x3 HEENT- PERRL, EOMI, non injected sclera, pink conjunctiva, MMM, oropharynx clear, TM clear bilat, ecchymosis beneath right lower lid extends toward nasal corners Neck- Supple, Decreased Lateral ROM, +spasm CVS- RRR, no murmur RESP-CTAB Neuro-CNII-XII in tact, no deficits  Skin- abrasion with swelling of left knee, abrasion right forearm MSK- decreased ROM left knee- swelling noted, small effusion, well healed surgical incision EXT- No edema Pulses- Radial, DP- 2+   EKG- NSR, LAD, left atrial enlargement no ST changes     Assessment & Plan:      Problem List Items Addressed This Visit   None      Note: This dictation was prepared with Dragon dictation along with smaller phrase technology. Any transcriptional errors that result from this process are unintentional.

## 2013-12-09 NOTE — Assessment & Plan Note (Signed)
Start pepcid again 1 tablet daily, this may be cause of chest discomfort

## 2013-12-09 NOTE — Assessment & Plan Note (Signed)
He's actually had 2 episodes recently here where his knee has been giving him trouble and buckling. He is followed by orthopedics oh: Get him a closer followup appointment as he has an effusion on his knee currently from the fall. He is on MS Contin however he cannot take during the day because it makes him too somnolent I'm not going to change this as his orthopedic surgeon is prescribing

## 2013-12-09 NOTE — Assessment & Plan Note (Signed)
CT benign, normal neurological exam Black eye resolving

## 2013-12-10 ENCOUNTER — Telehealth: Payer: Self-pay | Admitting: Orthopedic Surgery

## 2013-12-10 LAB — BASIC METABOLIC PANEL
BUN: 12 mg/dL (ref 6–23)
CO2: 24 mEq/L (ref 19–32)
Calcium: 9 mg/dL (ref 8.4–10.5)
Chloride: 105 mEq/L (ref 96–112)
Creat: 1.22 mg/dL (ref 0.50–1.35)
Glucose, Bld: 71 mg/dL (ref 70–99)
Potassium: 4.1 mEq/L (ref 3.5–5.3)
Sodium: 139 mEq/L (ref 135–145)

## 2013-12-10 LAB — TSH: TSH: 1.784 u[IU]/mL (ref 0.350–4.500)

## 2013-12-10 NOTE — Telephone Encounter (Signed)
Received a message on voice mail from [name]/[name] that David [name] had fallen and needed an appointment  For Dr. Romeo Apple to check his knee. I called him to make the appointment and he said he came with his wife to her appointment with Dr. Romeo Apple this morning and Dr. Romeo Apple looked at his knee And he did not need an appointment at this time.

## 2013-12-15 ENCOUNTER — Other Ambulatory Visit: Payer: Self-pay | Admitting: *Deleted

## 2013-12-15 ENCOUNTER — Telehealth: Payer: Self-pay | Admitting: Orthopedic Surgery

## 2013-12-15 MED ORDER — MORPHINE SULFATE ER 15 MG PO TBCR: 15.0000 mg | EXTENDED_RELEASE_TABLET | Freq: Two times a day (BID) | ORAL | Status: DC

## 2013-12-15 NOTE — Telephone Encounter (Signed)
Prescription available for pick up, patient aware 

## 2013-12-15 NOTE — Telephone Encounter (Signed)
Patient called to request refill of pain medication: morphine (MS CONTIN) 15 MG 12 hr tablet [[id number]] Ph# [phone].

## 2013-12-23 ENCOUNTER — Ambulatory Visit (INDEPENDENT_AMBULATORY_CARE_PROVIDER_SITE_OTHER): Payer: BC Managed Care – PPO | Admitting: *Deleted

## 2013-12-23 DIAGNOSIS — I4891 Unspecified atrial fibrillation: Secondary | ICD-10-CM

## 2013-12-23 DIAGNOSIS — G459 Transient cerebral ischemic attack, unspecified: Secondary | ICD-10-CM

## 2013-12-23 DIAGNOSIS — Z5181 Encounter for therapeutic drug level monitoring: Secondary | ICD-10-CM

## 2013-12-23 DIAGNOSIS — Z7901 Long term (current) use of anticoagulants: Secondary | ICD-10-CM

## 2013-12-23 LAB — POCT INR: INR: 1.8

## 2014-01-06 ENCOUNTER — Other Ambulatory Visit: Payer: Self-pay | Admitting: Cardiology

## 2014-01-06 ENCOUNTER — Telehealth: Payer: Self-pay | Admitting: Family Medicine

## 2014-01-06 DIAGNOSIS — G8929 Other chronic pain: Secondary | ICD-10-CM

## 2014-01-06 DIAGNOSIS — M5030 Other cervical disc degeneration, unspecified cervical region: Secondary | ICD-10-CM

## 2014-01-06 DIAGNOSIS — M542 Cervicalgia: Secondary | ICD-10-CM

## 2014-01-06 NOTE — Telephone Encounter (Signed)
[phone] Patient would like referral for his neck if possible it is not doing any better

## 2014-01-07 ENCOUNTER — Other Ambulatory Visit: Payer: Self-pay | Admitting: Cardiology

## 2014-01-07 NOTE — Telephone Encounter (Signed)
Send to spine center- Dx- DDD Cervical spine, chronic neck pain,    Send CT of neck from Sept 2015

## 2014-01-07 NOTE — Telephone Encounter (Signed)
Called pt and he has stated that his neck as gotten worse and would like to see a specialist to see if there is anything else they can do for him.Marland Kitchen

## 2014-01-07 NOTE — Telephone Encounter (Signed)
Referral to ortho spine specialist

## 2014-01-11 ENCOUNTER — Other Ambulatory Visit: Payer: Self-pay | Admitting: Family Medicine

## 2014-01-12 NOTE — Telephone Encounter (Signed)
Last Rf 9/2 #30 + 1  OK refill?

## 2014-01-13 ENCOUNTER — Telehealth: Payer: Self-pay | Admitting: Orthopedic Surgery

## 2014-01-13 ENCOUNTER — Ambulatory Visit (INDEPENDENT_AMBULATORY_CARE_PROVIDER_SITE_OTHER): Payer: BC Managed Care – PPO | Admitting: *Deleted

## 2014-01-13 ENCOUNTER — Other Ambulatory Visit: Payer: Self-pay | Admitting: *Deleted

## 2014-01-13 DIAGNOSIS — Z7901 Long term (current) use of anticoagulants: Secondary | ICD-10-CM

## 2014-01-13 DIAGNOSIS — I4891 Unspecified atrial fibrillation: Secondary | ICD-10-CM

## 2014-01-13 DIAGNOSIS — G459 Transient cerebral ischemic attack, unspecified: Secondary | ICD-10-CM

## 2014-01-13 DIAGNOSIS — Z5181 Encounter for therapeutic drug level monitoring: Secondary | ICD-10-CM

## 2014-01-13 LAB — POCT INR: INR: 2

## 2014-01-13 MED ORDER — MORPHINE SULFATE ER 15 MG PO TBCR: 15.0000 mg | EXTENDED_RELEASE_TABLET | Freq: Two times a day (BID) | ORAL | Status: DC

## 2014-01-13 NOTE — Telephone Encounter (Signed)
Patient called to request refill of pain medication: morphine (MS CONTIN) 15 MG 12 hr tablet [[id number] His ph# is [phone]

## 2014-01-14 NOTE — Telephone Encounter (Signed)
Prescription available, patient aware  

## 2014-01-17 IMAGING — CR DG ELBOW COMPLETE 3+V*L*
4 series · 4 of 4 positions shown · non-contrast
Comparison: None.

CLINICAL DATA: Pain.  No injury.  Mild redness.

LEFT ELBOW - COMPLETE 3+ VIEW

[view not recorded (1 of 4)]
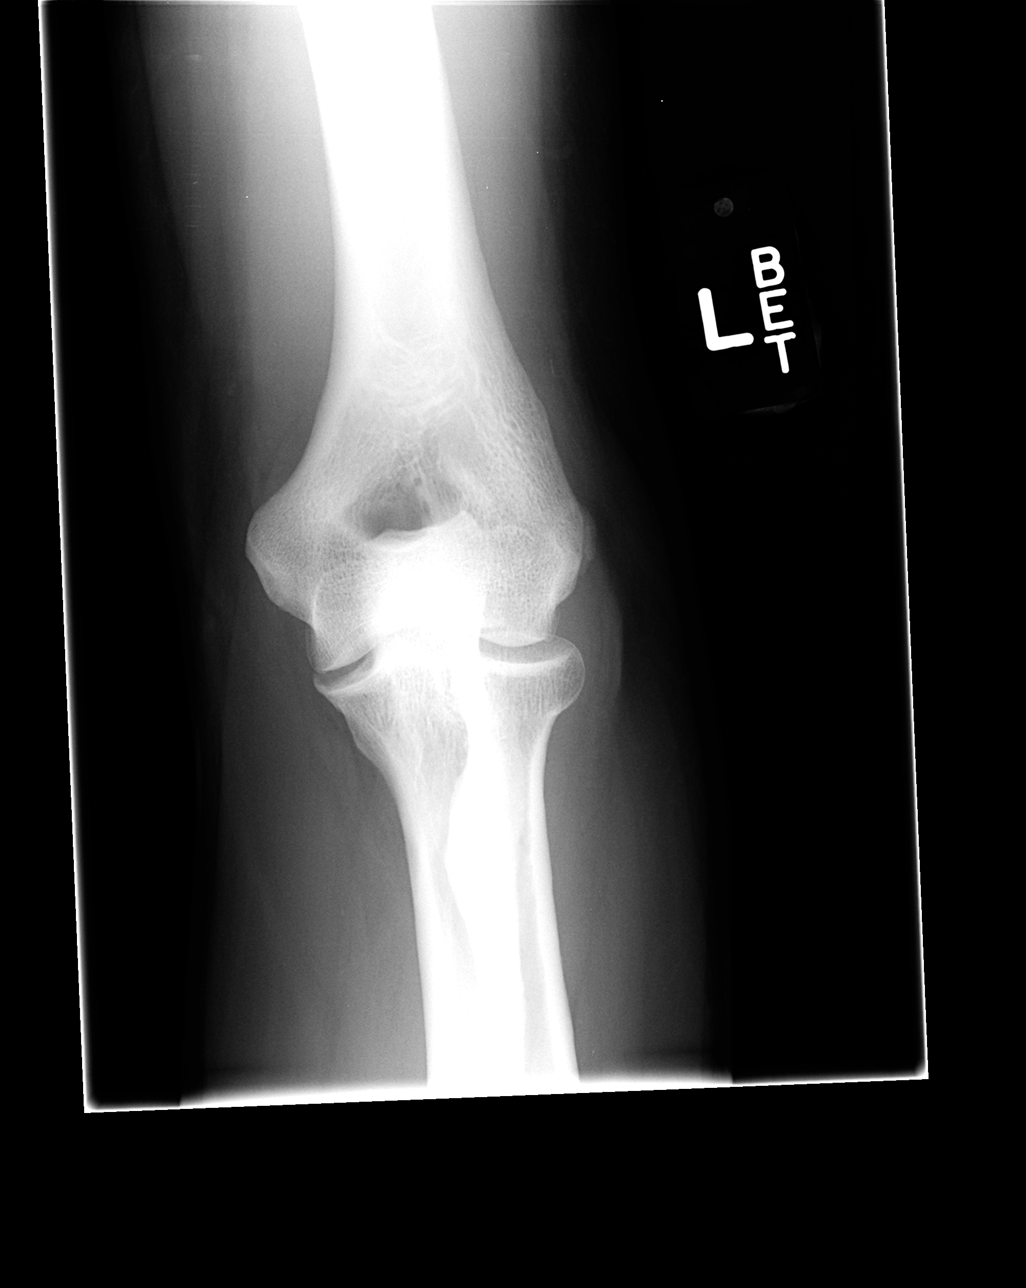

[view not recorded (2 of 4)]
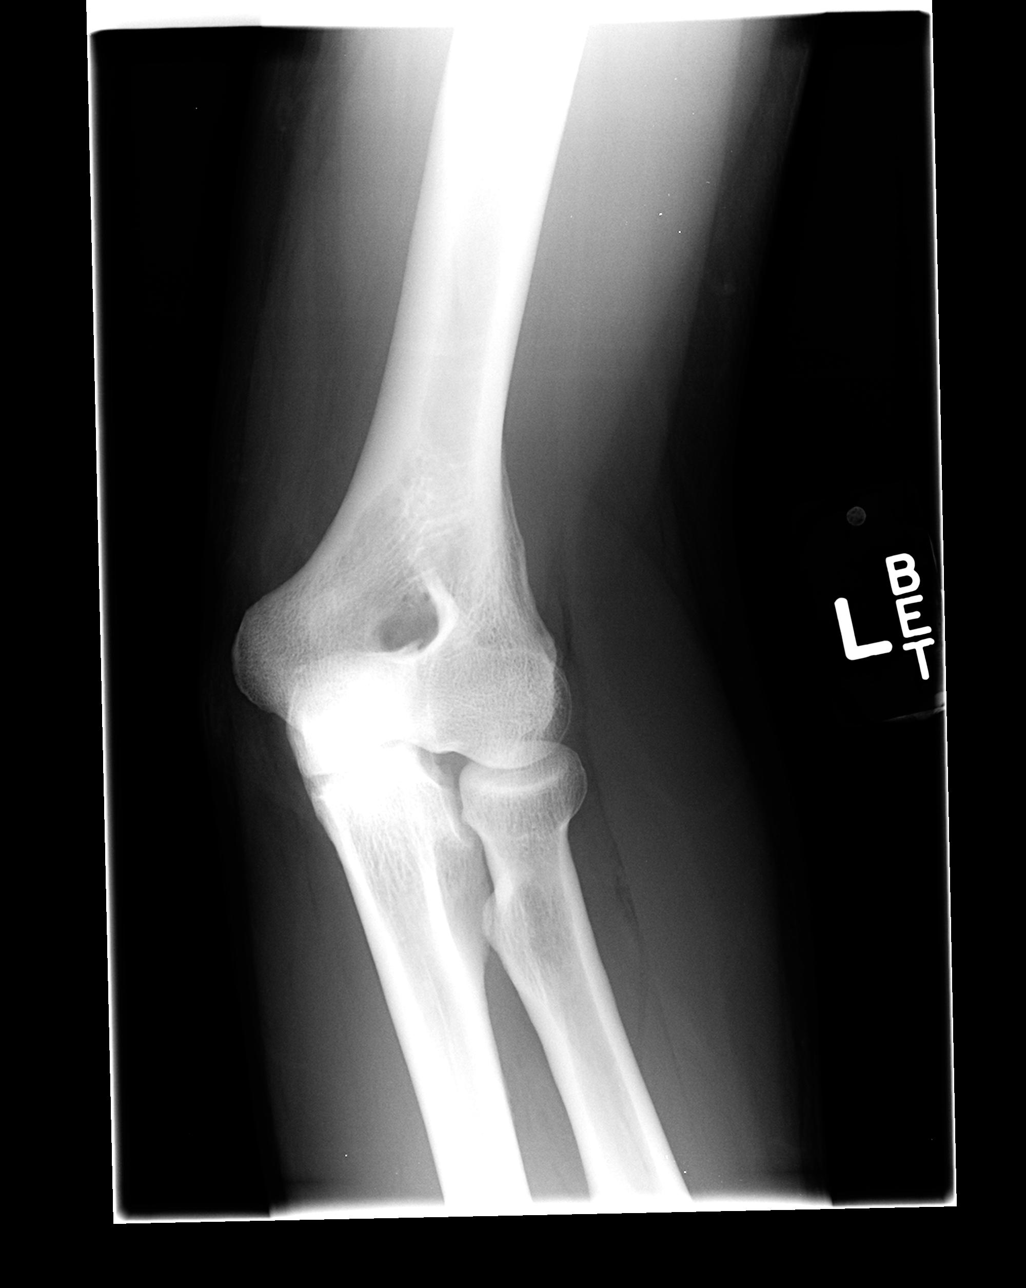

[view not recorded (3 of 4)]
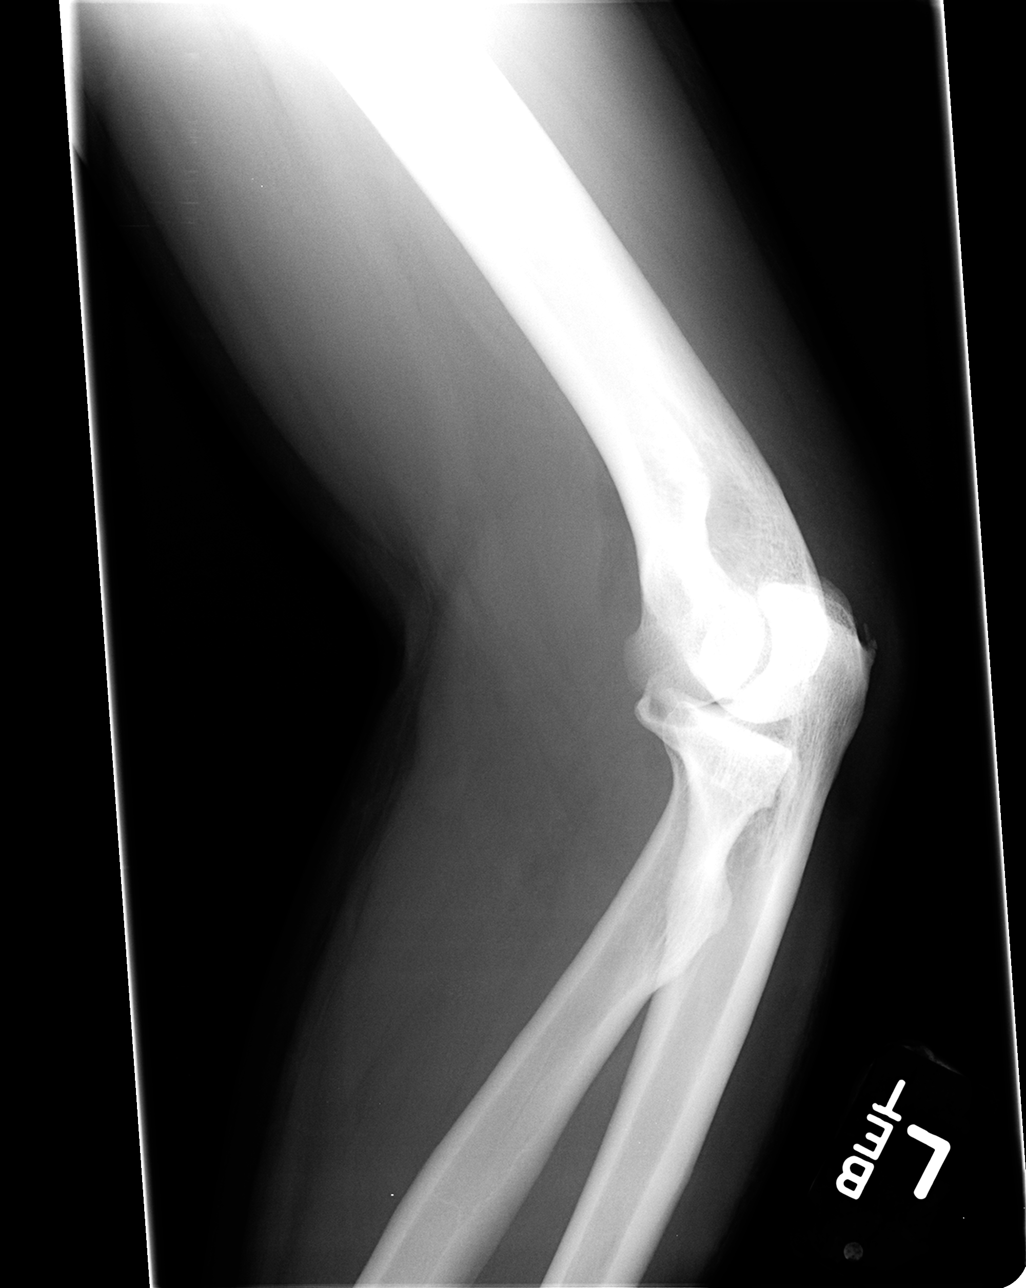

[view not recorded (4 of 4)]
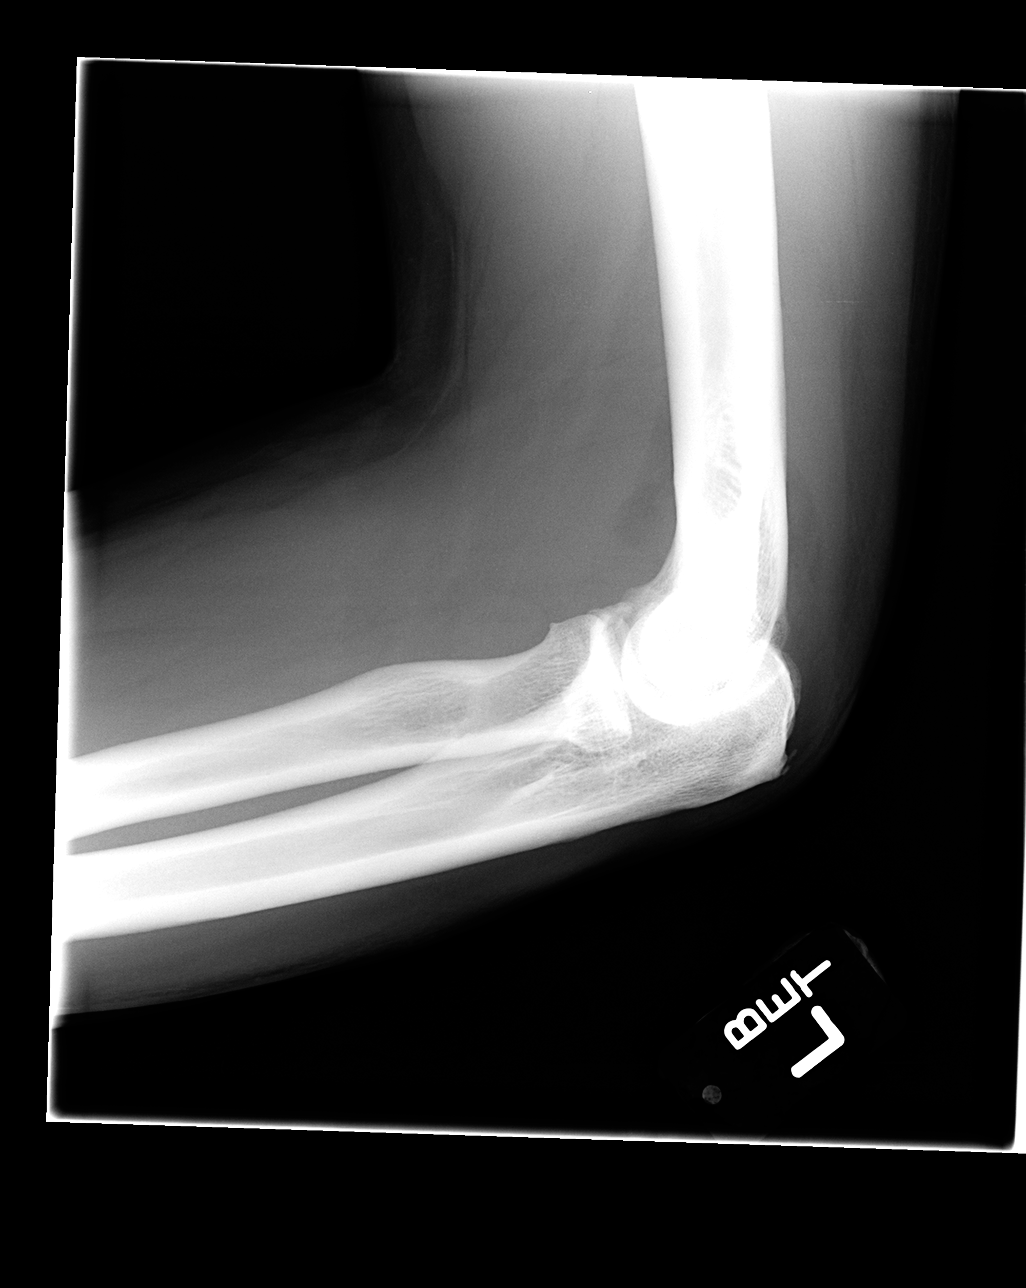

[4 of 4 positions shown; findings below may reference images not displayed]

FINDINGS: Findings of left elbow joint effusion.  This may be
related to the arthritis although infection not excluded in the
proper clinical setting.  No history trauma to suggest occult
fracture.  No plain film evidence of fracture or dislocation.

No plain film findings of osteomyelitis.

Small olecranon spur. Spur coronoid process.
IMPRESSION: Left elbow joint effusion.  Please see above.

## 2014-01-25 ENCOUNTER — Encounter: Payer: BC Managed Care – PPO | Admitting: Family Medicine

## 2014-02-03 ENCOUNTER — Ambulatory Visit (INDEPENDENT_AMBULATORY_CARE_PROVIDER_SITE_OTHER): Payer: BC Managed Care – PPO | Admitting: *Deleted

## 2014-02-03 ENCOUNTER — Other Ambulatory Visit: Payer: Self-pay | Admitting: Family Medicine

## 2014-02-03 DIAGNOSIS — G459 Transient cerebral ischemic attack, unspecified: Secondary | ICD-10-CM

## 2014-02-03 DIAGNOSIS — Z5181 Encounter for therapeutic drug level monitoring: Secondary | ICD-10-CM

## 2014-02-03 DIAGNOSIS — I4891 Unspecified atrial fibrillation: Secondary | ICD-10-CM

## 2014-02-03 DIAGNOSIS — Z7901 Long term (current) use of anticoagulants: Secondary | ICD-10-CM

## 2014-02-03 LAB — POCT INR: INR: 2.4

## 2014-02-04 ENCOUNTER — Telehealth: Payer: Self-pay | Admitting: Orthopedic Surgery

## 2014-02-04 ENCOUNTER — Other Ambulatory Visit: Payer: Self-pay | Admitting: *Deleted

## 2014-02-04 ENCOUNTER — Ambulatory Visit: Payer: BC Managed Care – PPO | Admitting: Orthopedic Surgery

## 2014-02-04 NOTE — Telephone Encounter (Signed)
Okay to refill give 2 

## 2014-02-04 NOTE — Telephone Encounter (Signed)
Yes but he'll have to go to a pain clinic to do that

## 2014-02-04 NOTE — Telephone Encounter (Signed)
Patient has a question regarding his pain medication, and requests to speak with nurse; his appointment had been scheduled for today, however, he is re-scheduled for 02/11/14, for total knee follow up with Xray.  Patient ph# is [phone]

## 2014-02-04 NOTE — Telephone Encounter (Signed)
PATIENT AWARE, WILL REFER

## 2014-02-04 NOTE — Telephone Encounter (Signed)
States Morphine makes him sluggish and has to lay down after taking for a few hours, then wakes up chest is hurting and short of breath, knee starts back hurting 3 hours before time to take Morphine again, states he didn't take the medication one day his chest didn't hurt... States Hydrocodone and Oxycodone didn't make him feel that way, can you go back to one of those?

## 2014-02-04 NOTE — Telephone Encounter (Signed)
Ok to refill??  Last office visit 01/06/2014.  Last refill 01/13/2014.

## 2014-02-05 NOTE — Telephone Encounter (Signed)
Rx sent 

## 2014-02-09 ENCOUNTER — Telehealth: Payer: Self-pay | Admitting: *Deleted

## 2014-02-09 ENCOUNTER — Other Ambulatory Visit: Payer: Self-pay | Admitting: *Deleted

## 2014-02-09 DIAGNOSIS — G894 Chronic pain syndrome: Secondary | ICD-10-CM

## 2014-02-09 NOTE — Telephone Encounter (Signed)
REFERRAL SENT TO [facility]

## 2014-02-10 ENCOUNTER — Telehealth: Payer: Self-pay | Admitting: Cardiology

## 2014-02-10 NOTE — Telephone Encounter (Signed)
Morphine is causing chest pain. Pt will see Dr. Romeo Apple tomorrow. Wants to switch to another pain medication. Pt says he will call back and update after appt tomorrow.

## 2014-02-11 ENCOUNTER — Ambulatory Visit (INDEPENDENT_AMBULATORY_CARE_PROVIDER_SITE_OTHER): Payer: BC Managed Care – PPO | Admitting: Orthopedic Surgery

## 2014-02-11 ENCOUNTER — Ambulatory Visit (INDEPENDENT_AMBULATORY_CARE_PROVIDER_SITE_OTHER): Payer: BC Managed Care – PPO

## 2014-02-11 ENCOUNTER — Encounter: Payer: Self-pay | Admitting: Orthopedic Surgery

## 2014-02-11 DIAGNOSIS — M1710 Unilateral primary osteoarthritis, unspecified knee: Secondary | ICD-10-CM

## 2014-02-11 DIAGNOSIS — M129 Arthropathy, unspecified: Secondary | ICD-10-CM

## 2014-02-11 DIAGNOSIS — Z96652 Presence of left artificial knee joint: Secondary | ICD-10-CM

## 2014-02-11 MED ORDER — HYDROCODONE-ACETAMINOPHEN 7.5-325 MG PO TABS: 1.0000 | ORAL_TABLET | ORAL | Status: DC | PRN

## 2014-02-11 NOTE — Patient Instructions (Signed)
You have been referred to pain management

## 2014-02-11 NOTE — Progress Notes (Signed)
Patient ID: David [name], male   DOB: 05-Dec-1960, 53 y.o.   MRN: [id number]  Chief complaint follow-up status post left total knee replacement in October 2014  He complains of anterior knee pain which is different from his preop knee pain but nonetheless is complaining of pain no swelling no catching locking no giving way. He does not like the long acting morphine. He prefers hydrocodone 7.5 mg for pain relief 5-6 times daily.  I told him if these can continue to take this medicine he needs to go to pain management he says he doesn't have the money.  I told him I can no longer prescribe it for him after this visit.  He has a history of chronic pain as well based on his preop symptoms. He was on Percocet he said he got good relief from that as well.  System review is not having any malaise or swelling or fever or chills.  His knee looks great he says it is sore to palpation touching over his incision. He has no joint effusion his knee flexion is greater than 110 his knee is stable strength is normal scans intact he has good pulse normal sensation good balance when he walks he walks with no assistive devices  His x-ray today shows a stable well-positioned knee prosthesis  Recommend pain management follow-up in a year for his x-ray of his knee. I did not find any problems with his knee replacement

## 2014-02-16 ENCOUNTER — Other Ambulatory Visit: Payer: Self-pay

## 2014-02-16 ENCOUNTER — Other Ambulatory Visit: Payer: Self-pay | Admitting: Cardiology

## 2014-02-16 MED ORDER — AMIODARONE HCL 100 MG PO TABS: 100.0000 mg | ORAL_TABLET | Freq: Every day | ORAL | Status: DC

## 2014-02-17 ENCOUNTER — Other Ambulatory Visit: Payer: Self-pay | Admitting: Family Medicine

## 2014-02-17 ENCOUNTER — Ambulatory Visit (INDEPENDENT_AMBULATORY_CARE_PROVIDER_SITE_OTHER): Payer: BC Managed Care – PPO | Admitting: Family Medicine

## 2014-02-17 ENCOUNTER — Encounter: Payer: Self-pay | Admitting: Family Medicine

## 2014-02-17 VITALS — BP 130/70 | HR 78 | Temp 98.2°F | Resp 16 | Ht 70.0 in | Wt 228.0 lb

## 2014-02-17 DIAGNOSIS — N400 Enlarged prostate without lower urinary tract symptoms: Secondary | ICD-10-CM | POA: Insufficient documentation

## 2014-02-17 DIAGNOSIS — Z23 Encounter for immunization: Secondary | ICD-10-CM

## 2014-02-17 DIAGNOSIS — Z125 Encounter for screening for malignant neoplasm of prostate: Secondary | ICD-10-CM

## 2014-02-17 DIAGNOSIS — R195 Other fecal abnormalities: Secondary | ICD-10-CM

## 2014-02-17 DIAGNOSIS — Z1211 Encounter for screening for malignant neoplasm of colon: Secondary | ICD-10-CM

## 2014-02-17 DIAGNOSIS — E785 Hyperlipidemia, unspecified: Secondary | ICD-10-CM

## 2014-02-17 DIAGNOSIS — N182 Chronic kidney disease, stage 2 (mild): Secondary | ICD-10-CM

## 2014-02-17 DIAGNOSIS — Z0000 Encounter for general adult medical examination without abnormal findings: Secondary | ICD-10-CM

## 2014-02-17 LAB — CBC WITH DIFFERENTIAL/PLATELET
Basophils Absolute: 0.1 10*3/uL (ref 0.0–0.1)
Basophils Relative: 1 % (ref 0–1)
Eosinophils Absolute: 0.1 10*3/uL (ref 0.0–0.7)
Eosinophils Relative: 1 % (ref 0–5)
HCT: 43.3 % (ref 39.0–52.0)
Hemoglobin: 14.1 g/dL (ref 13.0–17.0)
Lymphocytes Relative: 29 % (ref 12–46)
Lymphs Abs: 2.3 10*3/uL (ref 0.7–4.0)
MCH: 29 pg (ref 26.0–34.0)
MCHC: 32.6 g/dL (ref 30.0–36.0)
MCV: 89.1 fL (ref 78.0–100.0)
Monocytes Absolute: 1.1 10*3/uL — ABNORMAL HIGH (ref 0.1–1.0)
Monocytes Relative: 14 % — ABNORMAL HIGH (ref 3–12)
Neutro Abs: 4.4 10*3/uL (ref 1.7–7.7)
Neutrophils Relative %: 55 % (ref 43–77)
Platelets: 397 10*3/uL (ref 150–400)
RBC: 4.86 MIL/uL (ref 4.22–5.81)
RDW: 16 % — ABNORMAL HIGH (ref 11.5–15.5)
WBC: 8 10*3/uL (ref 4.0–10.5)

## 2014-02-17 LAB — LIPID PANEL
Cholesterol: 205 mg/dL — ABNORMAL HIGH (ref 0–200)
HDL: 70 mg/dL (ref >39)
LDL Cholesterol: 118 mg/dL — ABNORMAL HIGH (ref 0–99)
Total CHOL/HDL Ratio: 2.9 Ratio
Triglycerides: 86 mg/dL (ref <150)
VLDL: 17 mg/dL (ref 0–40)

## 2014-02-17 LAB — COMPREHENSIVE METABOLIC PANEL
ALT: 27 U/L (ref 0–53)
AST: 24 U/L (ref 0–37)
Albumin: 4.3 g/dL (ref 3.5–5.2)
Alkaline Phosphatase: 90 U/L (ref 39–117)
BUN: 22 mg/dL (ref 6–23)
CO2: 25 mEq/L (ref 19–32)
Calcium: 9.5 mg/dL (ref 8.4–10.5)
Chloride: 101 mEq/L (ref 96–112)
Creat: 1.34 mg/dL (ref 0.50–1.35)
Glucose, Bld: 108 mg/dL — ABNORMAL HIGH (ref 70–99)
Potassium: 4.7 mEq/L (ref 3.5–5.3)
Sodium: 138 mEq/L (ref 135–145)
Total Bilirubin: 0.4 mg/dL (ref 0.2–1.2)
Total Protein: 7.5 g/dL (ref 6.0–8.3)

## 2014-02-17 MED ORDER — AMIODARONE HCL 100 MG PO TABS: 100.0000 mg | ORAL_TABLET | Freq: Every day | ORAL | Status: DC

## 2014-02-17 NOTE — Assessment & Plan Note (Signed)
Noted on exam no symptoms at this time

## 2014-02-17 NOTE — Progress Notes (Signed)
Patient ID: David [name], male   DOB: 06-Aug-1960, 53 y.o.   MRN: [id number]   Subjective:    Patient ID: David [name], male    DOB: 05/29/1960, 53 y.o.   MRN: [id number]  Patient presents for CPE patient here for complete physical exam. He is to specific concerns today. He has some forms to be filled out for his wife job unfortunately she is losing her job and they are losing their insurance. He states they do qualify for Medicaid. He is overdue for colonoscopy. He is due for PSA screening. He is due for flu shot. He is also due for fasting labs. Medications were reviewed He is on Coumadin therapy secondary to atrial fibrillation. He has noted some blood in his stool on and off for the past few months. He does not strain with his bowel movements.   Review Of Systems:  GEN- denies fatigue, fever, weight loss,weakness, recent illness HEENT- denies eye drainage, change in vision, nasal discharge, CVS- denies chest pain, palpitations RESP- denies SOB, cough, wheeze ABD- denies N/V, change in stools, abd pain GU- denies dysuria, hematuria, dribbling, incontinence MSK- +joint pain, muscle aches, injury Neuro- denies headache, dizziness, syncope, seizure activity       Objective:    BP 130/70 mmHg  Pulse 78  Temp(Src) 98.2 F (36.8 C) (Oral)  Resp 16  Ht 5\' 10"  (1.778 m)  Wt 228 lb (103.42 kg)  BMI 32.71 kg/m2 GEN- NAD, alert and oriented x3 HEENT- PERRL, EOMI, non injected sclera, pink conjunctiva, MMM, oropharynx clear Neck- Supple, no LAD CVS- RRR, no murmur RESP-CTAB ABD-NABS,soft,NT,ND GU- normal external appearance rectum, normal tone, enlarged prostate,soft stool in vault, FOBT Positive EXT- No edema Pulses- Radial, DP- 2+        Assessment & Plan:      Problem List Items Addressed This Visit    Routine general medical examination at a health care facility - Primary   Relevant Orders      CBC with Differential      Comprehensive metabolic panel    Lipid panel   Hyperlipidemia    Other Visit Diagnoses    Prostate cancer screening        Relevant Orders       PSA    Colon cancer screening        Positive hemoccult on coumadin therapy, will not d/c at this time will send to GI to be evaluated       Note: This dictation was prepared with Dragon dictation along with smaller phrase technology. Any transcriptional errors that result from this process are unintentional.

## 2014-02-17 NOTE — Patient Instructions (Signed)
We will call with lab results  Continue current medications Flu shot Referral for colonoscopy- Dr. Darrick Penna F/U 4 months

## 2014-02-17 NOTE — Assessment & Plan Note (Signed)
Refer for colonoscopy, PSA discussed he opted for screening, fasting labs, flu shot

## 2014-02-17 NOTE — Addendum Note (Signed)
Addended by: Milinda Antis F on: 02/17/2014 09:36 PM   Modules accepted: Orders

## 2014-02-17 NOTE — Addendum Note (Signed)
Addended by: Phillips Odor on: 02/17/2014 05:06 PM   Modules accepted: Orders

## 2014-02-18 LAB — PSA: PSA: 1.18 ng/mL (ref <=4.00)

## 2014-02-18 LAB — HEMOGLOBIN A1C
Hgb A1c MFr Bld: 5.4 % (ref <5.7)
Mean Plasma Glucose: 108 mg/dL (ref <117)

## 2014-02-23 ENCOUNTER — Ambulatory Visit: Payer: BC Managed Care – PPO | Admitting: Cardiology

## 2014-02-24 NOTE — Telephone Encounter (Signed)
Per Dr Jon Gills office patient states he is no longer insured and declines referral at this time

## 2014-03-03 ENCOUNTER — Encounter: Payer: Self-pay | Admitting: *Deleted

## 2014-03-10 ENCOUNTER — Other Ambulatory Visit: Payer: Self-pay | Admitting: *Deleted

## 2014-03-10 ENCOUNTER — Telehealth: Payer: Self-pay | Admitting: Orthopedic Surgery

## 2014-03-10 MED ORDER — HYDROCODONE-ACETAMINOPHEN 7.5-325 MG PO TABS: 1.0000 | ORAL_TABLET | ORAL | Status: DC | PRN

## 2014-03-10 NOTE — Telephone Encounter (Signed)
Patient is calling asking for a refill on his pain medicationHYDROcodone-acetaminophen (NORCO) 7.5-325 MG per tablet, please advise

## 2014-03-11 NOTE — Telephone Encounter (Signed)
Patient picked up Rx

## 2014-03-11 NOTE — Telephone Encounter (Signed)
Prescription available, patient aware  

## 2014-03-22 ENCOUNTER — Encounter: Payer: Self-pay | Admitting: Cardiology

## 2014-03-22 ENCOUNTER — Ambulatory Visit (INDEPENDENT_AMBULATORY_CARE_PROVIDER_SITE_OTHER): Payer: Medicaid Other | Admitting: Cardiology

## 2014-03-22 ENCOUNTER — Ambulatory Visit (INDEPENDENT_AMBULATORY_CARE_PROVIDER_SITE_OTHER): Payer: Medicaid Other | Admitting: *Deleted

## 2014-03-22 VITALS — BP 138/86 | HR 69 | Ht 71.0 in | Wt 228.0 lb

## 2014-03-22 DIAGNOSIS — I480 Paroxysmal atrial fibrillation: Secondary | ICD-10-CM

## 2014-03-22 DIAGNOSIS — Z7901 Long term (current) use of anticoagulants: Secondary | ICD-10-CM

## 2014-03-22 DIAGNOSIS — I429 Cardiomyopathy, unspecified: Secondary | ICD-10-CM

## 2014-03-22 DIAGNOSIS — I4891 Unspecified atrial fibrillation: Secondary | ICD-10-CM

## 2014-03-22 DIAGNOSIS — I482 Chronic atrial fibrillation, unspecified: Secondary | ICD-10-CM

## 2014-03-22 DIAGNOSIS — Z5181 Encounter for therapeutic drug level monitoring: Secondary | ICD-10-CM

## 2014-03-22 DIAGNOSIS — G459 Transient cerebral ischemic attack, unspecified: Secondary | ICD-10-CM

## 2014-03-22 DIAGNOSIS — Z79899 Other long term (current) drug therapy: Secondary | ICD-10-CM

## 2014-03-22 LAB — POCT INR: INR: 2.5

## 2014-03-22 NOTE — Patient Instructions (Signed)
Your physician wants you to follow-up in: 6 months You will receive a reminder letter in the mail two months in advance. If you don't receive a letter, please call our office to schedule the follow-up appointment.   Please get blood work 1 week before NEXT VISIT   (TSH,LFT'S)     Your physician recommends that you continue on your current medications as directed. Please refer to the Current Medication list given to you today.     Thank you for choosing [facility] !

## 2014-03-22 NOTE — Assessment & Plan Note (Signed)
Continue current regimen with good symptom control at baseline on low-dose amiodarone, Coreg, and Coumadin. Follow-up TSH and LFTs for next visit in 6 months.

## 2014-03-22 NOTE — Assessment & Plan Note (Signed)
Symptomatically stable with LVEF improved to 50% by last echocardiogram. Will consider a follow-up study over the next year.

## 2014-03-22 NOTE — Progress Notes (Signed)
Reason for visit: Cardiomyopathy, atrial fibrillation  Clinical Summary David [name] is a 53 y.o.male last seen in May. He presents for a routine visit. Reports no significant problems with palpitations or chest discomfort, except when he had some changes in his pain control regimen. He states that he was taken off hydrocodone and tried on short acting morphine, when the symptoms worsened. They have completely resolved with switch back to his usual regimen. He is followed by Dr. Jeanice Lim and Dr. Romeo Apple.  Recent lab work in November showed hemoglobin 14.1, platelets 397, potassium 4.7, BUN 22, creatinine 1.3, normal AST and ALT, cholesterol 205, triglycerides 86, HDL 70, LDL 118.  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   Allergies  Allergen Reactions  . Azithromycin Itching    Current Outpatient Prescriptions  Medication Sig Dispense Refill  . allopurinol (ZYLOPRIM) 300 MG tablet Take 300 mg by mouth daily.     Marland Kitchen amiodarone (PACERONE) 100 MG tablet Take 1 tablet (100 mg total) by mouth daily. 90 tablet 3  . carvedilol (COREG) 3.125 MG tablet Take 3.125 mg by mouth 2 (two) times daily with a meal.    . famotidine (PEPCID) 20 MG tablet Take 1 tablet (20 mg total) by mouth daily. 30 tablet 6  . furosemide (LASIX) 40 MG tablet TAKE ONE-HALF TABLET BY MOUTH ONCE DAILY IN THE MORNING 30 tablet 6  . HYDROcodone-acetaminophen (NORCO) 7.5-325 MG per tablet Take 1 tablet by mouth every 4 (four) hours as needed for moderate pain. 180 tablet 0  . ibuprofen (ADVIL,MOTRIN) 800 MG tablet Take 1 tablet (800 mg total) by mouth every 8 (eight) hours as needed. 21 tablet 0  . KLOR-CON M10 10 MEQ tablet TAKE ONE TABLET BY MOUTH ONCE DAILY IN THE MORNING 30  tablet 6  . lisinopril (PRINIVIL,ZESTRIL) 5 MG tablet Take 5 mg by mouth daily.    . methocarbamol (ROBAXIN) 500 MG tablet TAKE ONE TABLET BY MOUTH EVERY 8 HOURS AS NEEDED FOR MUSCLE SPASM 30 tablet 1  . warfarin (COUMADIN) 5 MG tablet Take 1 tablet daily except 1/2 tablet on Thursdays or as directed 45 tablet 3   No current facility-administered medications for this visit.    Past Medical History  Diagnosis Date  . Atrial fibrillation   . Alcohol abuse   . TIA (transient ischemic attack)   . Nonischemic cardiomyopathy     LVEF 20-25% improved to 50% on medical therapy  . Chronic kidney disease, stage 2, mildly decreased GFR   . GERD (gastroesophageal reflux disease)   . Arthritis     Bilateral knees   . Stroke     states ministroke about 06/2011. No deficits  . Gout     Social History David [name] reports that he quit smoking about 4 years ago. His smoking use included Cigarettes. He smoked 0.00 packs per day. He has quit using smokeless tobacco. His smokeless tobacco use included Snuff. David [name] reports that he drinks about 0.6 oz of alcohol per week.  Review of Systems Complete review of systems negative except as otherwise outlined in the clinical summary and also the following. No reported bleeding episodes.  Physical Examination Filed Vitals:   03/22/14 1525  BP: 138/86  Pulse: 69   Filed Weights   03/22/14 1525  Weight: 228 lb (103.42 kg)  Appears comfortable at rest.  HEENT: Conjunctiva and lids normal, oropharynx clear.  Neck: Supple, no elevated JVP or carotid bruits, no thyromegaly.  Lungs: Clear to auscultation, nonlabored breathing at rest.  Cardiac:RRR, no S3 or significant systolic murmur, no pericardial rub.  Abdomen: Soft, nontender, bowel sounds present, no guarding or rebound.  Extremities: No pitting edema, distal pulses 2+.    Problem List and Plan   Atrial fibrillation Continue current regimen with good symptom control at baseline on  low-dose amiodarone, Coreg, and Coumadin. Follow-up TSH and LFTs for next visit in 6 months.  Secondary cardiomyopathy Symptomatically stable with LVEF improved to 50% by last echocardiogram. Will consider a follow-up study over the next year.    Jonelle Sidle, M.D., F.A.C.C.

## 2014-04-06 ENCOUNTER — Telehealth: Payer: Self-pay | Admitting: Orthopedic Surgery

## 2014-04-06 ENCOUNTER — Other Ambulatory Visit: Payer: Self-pay | Admitting: *Deleted

## 2014-04-06 MED ORDER — HYDROCODONE-ACETAMINOPHEN 7.5-325 MG PO TABS: 1.0000 | ORAL_TABLET | ORAL | Status: DC | PRN

## 2014-04-06 NOTE — Telephone Encounter (Signed)
Prescription available, patient aware  

## 2014-04-06 NOTE — Telephone Encounter (Signed)
Patient picked up Rx

## 2014-04-06 NOTE — Telephone Encounter (Signed)
Patient called, requests refill on medication: HYDROcodone-acetaminophen (NORCO) 7.5-325 MG per tablet [[id number]]  - states he has not gone to pain management at this time due to insurance and is waiting till after first of the year.   His phone # is [phone]

## 2014-04-12 ENCOUNTER — Other Ambulatory Visit: Payer: Self-pay | Admitting: Family Medicine

## 2014-04-12 NOTE — Telephone Encounter (Signed)
Ok to refill??  Last office visit 02/17/2014.  Last refill 02/05/2014, #1 refill.

## 2014-04-12 NOTE — Telephone Encounter (Signed)
Prescription sent to pharmacy.

## 2014-04-12 NOTE — Telephone Encounter (Signed)
Okay to refill? 

## 2014-05-03 ENCOUNTER — Encounter: Payer: Self-pay | Admitting: Family Medicine

## 2014-05-03 ENCOUNTER — Ambulatory Visit (INDEPENDENT_AMBULATORY_CARE_PROVIDER_SITE_OTHER): Payer: Medicaid Other | Admitting: *Deleted

## 2014-05-03 DIAGNOSIS — Z5181 Encounter for therapeutic drug level monitoring: Secondary | ICD-10-CM

## 2014-05-03 DIAGNOSIS — Z7901 Long term (current) use of anticoagulants: Secondary | ICD-10-CM

## 2014-05-03 DIAGNOSIS — I4891 Unspecified atrial fibrillation: Secondary | ICD-10-CM

## 2014-05-03 DIAGNOSIS — G459 Transient cerebral ischemic attack, unspecified: Secondary | ICD-10-CM

## 2014-05-03 LAB — POCT INR: INR: 1.7

## 2014-05-05 ENCOUNTER — Other Ambulatory Visit: Payer: Self-pay | Admitting: *Deleted

## 2014-05-05 ENCOUNTER — Telehealth: Payer: Self-pay | Admitting: Orthopedic Surgery

## 2014-05-05 MED ORDER — HYDROCODONE-ACETAMINOPHEN 7.5-325 MG PO TABS: 1.0000 | ORAL_TABLET | ORAL | Status: DC | PRN

## 2014-05-05 NOTE — Telephone Encounter (Signed)
Patient is calling requesting pain medication refill HYDROcodone-acetaminophen (NORCO) 7.5-325 MG per tablet [[id number]] please advise?

## 2014-05-06 NOTE — Telephone Encounter (Signed)
Prescription is available to be picked up, patient is aware

## 2014-05-06 NOTE — Telephone Encounter (Signed)
Patient picked up Rx

## 2014-05-10 ENCOUNTER — Other Ambulatory Visit: Payer: Self-pay | Admitting: Cardiology

## 2014-05-31 ENCOUNTER — Ambulatory Visit (INDEPENDENT_AMBULATORY_CARE_PROVIDER_SITE_OTHER): Payer: Medicaid Other | Admitting: *Deleted

## 2014-05-31 DIAGNOSIS — G459 Transient cerebral ischemic attack, unspecified: Secondary | ICD-10-CM

## 2014-05-31 DIAGNOSIS — I4891 Unspecified atrial fibrillation: Secondary | ICD-10-CM

## 2014-05-31 DIAGNOSIS — I480 Paroxysmal atrial fibrillation: Secondary | ICD-10-CM

## 2014-05-31 DIAGNOSIS — Z5181 Encounter for therapeutic drug level monitoring: Secondary | ICD-10-CM

## 2014-05-31 DIAGNOSIS — Z7901 Long term (current) use of anticoagulants: Secondary | ICD-10-CM

## 2014-05-31 LAB — POCT INR: INR: 2

## 2014-06-02 ENCOUNTER — Telehealth: Payer: Self-pay | Admitting: Radiology

## 2014-06-02 NOTE — Telephone Encounter (Signed)
Patient needs his Hydrocodone refilled.

## 2014-06-03 ENCOUNTER — Other Ambulatory Visit: Payer: Self-pay | Admitting: *Deleted

## 2014-06-03 MED ORDER — HYDROCODONE-ACETAMINOPHEN 7.5-325 MG PO TABS: 1.0000 | ORAL_TABLET | ORAL | Status: DC | PRN

## 2014-06-03 NOTE — Telephone Encounter (Signed)
Patient picked up prescription.

## 2014-06-03 NOTE — Telephone Encounter (Signed)
Prescription available, patient aware  

## 2014-06-03 NOTE — Telephone Encounter (Signed)
refill 

## 2014-06-07 ENCOUNTER — Encounter: Payer: Self-pay | Admitting: Family Medicine

## 2014-06-15 ENCOUNTER — Telehealth: Payer: Self-pay | Admitting: *Deleted

## 2014-06-15 ENCOUNTER — Other Ambulatory Visit: Payer: Self-pay | Admitting: *Deleted

## 2014-06-15 MED ORDER — CARVEDILOL 3.125 MG PO TABS: 3.1250 mg | ORAL_TABLET | Freq: Two times a day (BID) | ORAL | Status: DC

## 2014-06-15 NOTE — Telephone Encounter (Signed)
escribed refill per request 

## 2014-06-15 NOTE — Telephone Encounter (Signed)
PT NEEDS A CORRECT RX CALLED IN FOR CARVEDILOL PT DOSE WAS CHANGED PLEASE CALL PATIENT BACK

## 2014-06-15 NOTE — Telephone Encounter (Signed)
Carvedilol 6.25 mg #30

## 2014-06-15 NOTE — Telephone Encounter (Signed)
Pt has correct rx for Coreg 3.125 mg twice a day at [facility] had transferred rx from [facility] that was 6.25 mg take 1/2 tab

## 2014-06-28 ENCOUNTER — Ambulatory Visit (INDEPENDENT_AMBULATORY_CARE_PROVIDER_SITE_OTHER): Payer: Medicaid Other | Admitting: *Deleted

## 2014-06-28 DIAGNOSIS — G459 Transient cerebral ischemic attack, unspecified: Secondary | ICD-10-CM

## 2014-06-28 DIAGNOSIS — I4891 Unspecified atrial fibrillation: Secondary | ICD-10-CM

## 2014-06-28 DIAGNOSIS — Z5181 Encounter for therapeutic drug level monitoring: Secondary | ICD-10-CM

## 2014-06-28 DIAGNOSIS — Z7901 Long term (current) use of anticoagulants: Secondary | ICD-10-CM

## 2014-06-28 DIAGNOSIS — I480 Paroxysmal atrial fibrillation: Secondary | ICD-10-CM

## 2014-06-28 LAB — POCT INR: INR: 3.4

## 2014-07-02 ENCOUNTER — Ambulatory Visit: Payer: Medicaid Other | Admitting: Family Medicine

## 2014-07-05 ENCOUNTER — Ambulatory Visit: Payer: Medicaid Other | Admitting: Family Medicine

## 2014-07-05 ENCOUNTER — Telehealth: Payer: Self-pay | Admitting: *Deleted

## 2014-07-05 NOTE — Telephone Encounter (Signed)
Requesting Hydrocodone refill.     

## 2014-07-08 ENCOUNTER — Encounter: Payer: Self-pay | Admitting: Family Medicine

## 2014-07-12 ENCOUNTER — Other Ambulatory Visit: Payer: Self-pay | Admitting: *Deleted

## 2014-07-12 MED ORDER — HYDROCODONE-ACETAMINOPHEN 7.5-325 MG PO TABS: 1.0000 | ORAL_TABLET | ORAL | Status: DC | PRN

## 2014-07-12 NOTE — Telephone Encounter (Signed)
Patient called to follow up on refill for pain medication, Hydrocodone-acetophetamine(Norco)/7.5-325.

## 2014-07-14 NOTE — Telephone Encounter (Signed)
Patient picked up Rx

## 2014-07-14 NOTE — Telephone Encounter (Signed)
Prescription available, patient is aware

## 2014-07-15 ENCOUNTER — Other Ambulatory Visit: Payer: Self-pay | Admitting: Cardiology

## 2014-07-26 ENCOUNTER — Other Ambulatory Visit: Payer: Self-pay | Admitting: Family Medicine

## 2014-07-26 NOTE — Telephone Encounter (Signed)
Medication filled x1 with no refills.  

## 2014-07-27 ENCOUNTER — Other Ambulatory Visit: Payer: Self-pay | Admitting: Family Medicine

## 2014-07-27 NOTE — Telephone Encounter (Signed)
Medication filled x1 with no refills.  

## 2014-08-02 ENCOUNTER — Ambulatory Visit (INDEPENDENT_AMBULATORY_CARE_PROVIDER_SITE_OTHER): Payer: Medicaid Other | Admitting: *Deleted

## 2014-08-02 DIAGNOSIS — I480 Paroxysmal atrial fibrillation: Secondary | ICD-10-CM | POA: Diagnosis not present

## 2014-08-02 DIAGNOSIS — Z5181 Encounter for therapeutic drug level monitoring: Secondary | ICD-10-CM | POA: Diagnosis not present

## 2014-08-02 LAB — POCT INR: INR: 2.5

## 2014-08-09 ENCOUNTER — Other Ambulatory Visit: Payer: Self-pay | Admitting: *Deleted

## 2014-08-09 ENCOUNTER — Telehealth: Payer: Self-pay | Admitting: Orthopedic Surgery

## 2014-08-09 MED ORDER — HYDROCODONE-ACETAMINOPHEN 7.5-325 MG PO TABS: 1.0000 | ORAL_TABLET | ORAL | Status: DC | PRN

## 2014-08-09 NOTE — Telephone Encounter (Signed)
Patient picked up prescription.

## 2014-08-09 NOTE — Telephone Encounter (Signed)
Prescription available, called patient, left vm 

## 2014-08-09 NOTE — Telephone Encounter (Signed)
Call received from patient requesting refill of medication: HYDROcodone-acetaminophen (NORCO) 7.5-325 MG per tablet [[id number]]  - ph# [phone] (patient has a yearly appointment only)

## 2014-08-30 ENCOUNTER — Other Ambulatory Visit: Payer: Self-pay | Admitting: Family Medicine

## 2014-08-30 NOTE — Telephone Encounter (Signed)
Refill appropriate and filled per protocol. 

## 2014-09-02 ENCOUNTER — Telehealth: Payer: Self-pay | Admitting: Orthopedic Surgery

## 2014-09-02 ENCOUNTER — Other Ambulatory Visit: Payer: Self-pay | Admitting: *Deleted

## 2014-09-02 MED ORDER — HYDROCODONE-ACETAMINOPHEN 7.5-325 MG PO TABS: 1.0000 | ORAL_TABLET | ORAL | Status: DC | PRN

## 2014-09-02 NOTE — Telephone Encounter (Signed)
Patient is calling requesting a refill on pain medication HYDROcodone-acetaminophen (NORCO) 7.5-325 MG per tablet please advise?

## 2014-09-07 NOTE — Telephone Encounter (Signed)
Prescription available, patient aware  

## 2014-09-07 NOTE — Telephone Encounter (Signed)
Patient picked up Rx

## 2014-09-13 ENCOUNTER — Ambulatory Visit (INDEPENDENT_AMBULATORY_CARE_PROVIDER_SITE_OTHER): Payer: 59 | Admitting: *Deleted

## 2014-09-13 ENCOUNTER — Telehealth: Payer: Self-pay | Admitting: *Deleted

## 2014-09-13 DIAGNOSIS — I4891 Unspecified atrial fibrillation: Secondary | ICD-10-CM

## 2014-09-13 DIAGNOSIS — Z5181 Encounter for therapeutic drug level monitoring: Secondary | ICD-10-CM

## 2014-09-13 DIAGNOSIS — I480 Paroxysmal atrial fibrillation: Secondary | ICD-10-CM | POA: Diagnosis not present

## 2014-09-13 LAB — POCT INR: INR: 1.2

## 2014-09-13 NOTE — Telephone Encounter (Signed)
Submitted referral thru [facility] compass to Cardiologist Nona Dell at [facility] with Referral number [id number]  Type of referral: consult and treat  Number of visits:6  Start Date: 09/13/14  End Date:03/15/15  Dx:I48.91-Unspecified atrial fibrillation  Copy has been faxed to [facility] at [facility]

## 2014-09-22 ENCOUNTER — Other Ambulatory Visit: Payer: Self-pay | Admitting: Cardiology

## 2014-09-27 ENCOUNTER — Ambulatory Visit (INDEPENDENT_AMBULATORY_CARE_PROVIDER_SITE_OTHER): Payer: 59 | Admitting: *Deleted

## 2014-09-27 DIAGNOSIS — I4891 Unspecified atrial fibrillation: Secondary | ICD-10-CM

## 2014-09-27 DIAGNOSIS — I480 Paroxysmal atrial fibrillation: Secondary | ICD-10-CM

## 2014-09-27 DIAGNOSIS — Z5181 Encounter for therapeutic drug level monitoring: Secondary | ICD-10-CM | POA: Diagnosis not present

## 2014-09-27 LAB — POCT INR: INR: 2.4

## 2014-10-01 ENCOUNTER — Other Ambulatory Visit: Payer: Self-pay | Admitting: Family Medicine

## 2014-10-01 NOTE — Telephone Encounter (Signed)
Patient has been discharged from practice.  Medication refill denied 

## 2014-10-04 ENCOUNTER — Telehealth: Payer: Self-pay | Admitting: Orthopedic Surgery

## 2014-10-04 ENCOUNTER — Other Ambulatory Visit: Payer: Self-pay | Admitting: *Deleted

## 2014-10-04 MED ORDER — HYDROCODONE-ACETAMINOPHEN 7.5-325 MG PO TABS: 1.0000 | ORAL_TABLET | ORAL | Status: DC | PRN

## 2014-10-04 NOTE — Telephone Encounter (Signed)
Prescription available, patient aware  

## 2014-10-04 NOTE — Telephone Encounter (Signed)
Patient picked up Rx

## 2014-10-08 ENCOUNTER — Other Ambulatory Visit: Payer: Self-pay | Admitting: Cardiology

## 2014-10-08 ENCOUNTER — Other Ambulatory Visit: Payer: Self-pay | Admitting: Family Medicine

## 2014-10-08 NOTE — Telephone Encounter (Signed)
Patient has been discharged from practice.  Medication refill denied 

## 2014-10-27 ENCOUNTER — Ambulatory Visit (INDEPENDENT_AMBULATORY_CARE_PROVIDER_SITE_OTHER): Payer: 59 | Admitting: Cardiology

## 2014-10-27 ENCOUNTER — Encounter: Payer: Self-pay | Admitting: Cardiology

## 2014-10-27 ENCOUNTER — Ambulatory Visit (INDEPENDENT_AMBULATORY_CARE_PROVIDER_SITE_OTHER): Payer: 59 | Admitting: *Deleted

## 2014-10-27 VITALS — BP 128/70 | HR 69 | Ht 71.0 in | Wt 230.0 lb

## 2014-10-27 DIAGNOSIS — I4891 Unspecified atrial fibrillation: Secondary | ICD-10-CM

## 2014-10-27 DIAGNOSIS — I480 Paroxysmal atrial fibrillation: Secondary | ICD-10-CM | POA: Diagnosis not present

## 2014-10-27 DIAGNOSIS — I481 Persistent atrial fibrillation: Secondary | ICD-10-CM

## 2014-10-27 DIAGNOSIS — Z5181 Encounter for therapeutic drug level monitoring: Secondary | ICD-10-CM | POA: Diagnosis not present

## 2014-10-27 DIAGNOSIS — I429 Cardiomyopathy, unspecified: Secondary | ICD-10-CM | POA: Diagnosis not present

## 2014-10-27 DIAGNOSIS — Z79899 Other long term (current) drug therapy: Secondary | ICD-10-CM | POA: Diagnosis not present

## 2014-10-27 DIAGNOSIS — I4819 Other persistent atrial fibrillation: Secondary | ICD-10-CM

## 2014-10-27 DIAGNOSIS — I428 Other cardiomyopathies: Secondary | ICD-10-CM

## 2014-10-27 LAB — POCT INR: INR: 1.1

## 2014-10-27 MED ORDER — CARVEDILOL 6.25 MG PO TABS: 6.2500 mg | ORAL_TABLET | Freq: Two times a day (BID) | ORAL | Status: DC

## 2014-10-27 NOTE — Patient Instructions (Signed)
Your physician recommends that you schedule a follow-up appointment in: 2 WEEKS WITH DR. [facility]  Your physician has requested that you have an echocardiogram. Echocardiography is a painless test that uses sound waves to create images of your heart. It provides your doctor with information about the size and shape of your heart and how well your heart's chambers and valves are working. This procedure takes approximately one hour. There are no restrictions for this procedure.   HAVE LABS DONE: TSH & LFT  INCREASE YOUR COREG TO 6.25 MG 2 TIMES DAILY   Thanks for choosing [facility]!!!

## 2014-10-27 NOTE — Progress Notes (Signed)
Cardiology Office Note  Date: 10/27/2014   ID: David [name], DOB Sep 21, 1960, MRN [id number]  PCP: Milinda Antis, MD  Primary Cardiologist: Nona Dell, MD   Chief Complaint  Patient presents with  . Atrial Fibrillation  . Cardiomyopathy    History of Present Illness: David [name] is a 54 y.o. male last seen in December 2015. He presents today with family member for a follow-up visit. He indicates that over the last few months he has felt palpitations, has had progressive dyspnea on exertion as well, no chest pain. His weight has been relatively stable. Change in medical regimen although he ran out of his Coumadin this past Saturday, due to have an INR checked today and resume the medication.  On examination his heart rate was irregular suggestive of recurrent atrial fibrillation, and this was documented by follow-up ECG today. He states that he has been taking his Coreg and amiodarone as directed.  He is due for follow-up TSH and LFTs on amiodarone.   Past Medical History  Diagnosis Date  . Atrial fibrillation   . Alcohol abuse   . TIA (transient ischemic attack)   . Nonischemic cardiomyopathy     LVEF 20-25% improved to 50% on medical therapy  . Chronic kidney disease, stage 2, mildly decreased GFR   . GERD (gastroesophageal reflux disease)   . Arthritis     Bilateral knees   . Gout     Past Surgical History  Procedure Laterality Date  . Arthroscopic knee surgery Right 1987  . Cardioversion  10/08/2011    Procedure: CARDIOVERSION;  Surgeon: Jonelle Sidle, MD;  Location: [facility];  Service: Cardiovascular;  Laterality: N/A;  Done in PACU  . Cardioversion  01/10/2012    Procedure: CARDIOVERSION;  Surgeon: Marinus Maw, MD;  Location: [facility] ENDOSCOPY;  Service: Cardiovascular;  Laterality: N/A;  . Total knee arthroplasty Left 01/16/2013    Procedure: TOTAL KNEE ARTHROPLASTY;  Surgeon: Vickki Hearing, MD;  Location: [facility] ORS;  Service: Orthopedics;   Laterality: Left;    Current Outpatient Prescriptions  Medication Sig Dispense Refill  . allopurinol (ZYLOPRIM) 300 MG tablet Take 300 mg by mouth daily.     Marland Kitchen amiodarone (PACERONE) 100 MG tablet Take 1 tablet (100 mg total) by mouth daily. 90 tablet 3  . famotidine (PEPCID) 20 MG tablet TAKE ONE TABLET BY MOUTH ONCE DAILY. 30 tablet 0  . furosemide (LASIX) 40 MG tablet TAKE ONE-HALF TABLET BY MOUTH EVERY MORNING. 15 tablet 0  . HYDROcodone-acetaminophen (NORCO) 7.5-325 MG per tablet Take 1 tablet by mouth every 4 (four) hours as needed for moderate pain. 180 tablet 0  . KLOR-CON M10 10 MEQ tablet TAKE ONE TABLET BY MOUTH ONCE DAILY IN THE MORNING 30 tablet 6  . lisinopril (PRINIVIL,ZESTRIL) 5 MG tablet TAKE ONE TABLET BY MOUTH DAILY. 30 tablet 6  . methocarbamol (ROBAXIN) 500 MG tablet TAKE 1 TABLET BY MOUTH EVERY 8 HOURS AS NEEDED FOR MUSCLE SPASM. 30 tablet 0  . potassium chloride (K-DUR) 10 MEQ tablet TAKE ONE TABLET BY MOUTH EVERY MORNING. 30 tablet 0  . warfarin (COUMADIN) 5 MG tablet Take 1 tablet daily except 1 1/2 tablet on Mondays or as directed 45 tablet 3  . carvedilol (COREG) 6.25 MG tablet Take 1 tablet (6.25 mg total) by mouth 2 (two) times daily. 180 tablet 3   No current facility-administered medications for this visit.    Allergies:  Azithromycin   Social History: The patient  reports that he quit smoking about 4 years ago. His smoking use included Cigarettes. He has quit using smokeless tobacco. His smokeless tobacco use included Snuff. He reports that he drinks about 0.6 oz of alcohol per week. He reports that he does not use illicit drugs.   ROS:  Please see the history of present illness. Otherwise, complete review of systems is positive for none.  All other systems are reviewed and negative.   Physical Exam: VS:  BP 128/70 mmHg  Pulse 69  Ht  (1.803 m)  Wt 230 lb (104.327 kg)  BMI 32.09 kg/m2  SpO2 97%, BMI Body mass index is 32.09 kg/(m^2).  Wt Readings  from Last 3 Encounters:  10/27/14 230 lb (104.327 kg)  03/22/14 228 lb (103.42 kg)  02/17/14 228 lb (103.42 kg)     Appears comfortable at rest.  HEENT: Conjunctiva and lids normal, oropharynx clear.  Neck: Supple, no elevated JVP or carotid bruits, no thyromegaly.  Lungs: Clear to auscultation, nonlabored breathing at rest.  Cardiac: Irregularly irregular, no S3 or significant systolic murmur, no pericardial rub.  Abdomen: Soft, nontender, bowel sounds present, no guarding or rebound.  Extremities: No pitting edema, distal pulses 2+.  Skin: Warm and dry. Musculoskeletal: No kyphosis. Neuropsychiatric: Alert and oriented 3, affect appropriate.   ECG: ECG is ordered today and shows atrial fibrillation at 90 bpm with nonspecific ST changes.  Recent Labwork: 12/09/2013: TSH 1.784 02/17/2014: ALT 27; AST 24; BUN 22; Creat 1.34; Hemoglobin 14.1; Platelets 397; Potassium 4.7; Sodium 138     Component Value Date/Time   CHOL 205* 02/17/2014 1204   TRIG 86 02/17/2014 1204   HDL 70 02/17/2014 1204   CHOLHDL 2.9 02/17/2014 1204   VLDL 17 02/17/2014 1204   LDLCALC 118* 02/17/2014 1204    Other Studies Reviewed Today:  Echocardiogram 08/27/2011: Study Conclusions  - Left ventricle: The cavity size was mildly dilated. Wall thickness was normal. Systolic function wasborderline reduced. The estimated ejection fraction was 50%. No regional wall motion abnormalities. - Aortic valve: Possibly functionally bicuspid; mildly thickened, mildly calcified leaflets. Mild regurgitation. - Left atrium: The atrium was moderately dilated. - Right ventricle: Systolic function was mildly reduced. - Right atrium: The atrium was mildly dilated. - Atrial septum: No defect or patent foramen ovale was identified. Impressions:  - Compared to the previous study performed 06/15/11, RV and LV systolic function markedly improved, AI decreased, IVC and right sided pressures now normal, left  pleural effusion no longer noted.  ASSESSMENT AND PLAN:  1. Persistent atrial fibrillation, possible onset within the last few months based on description of palpitations and increasing dyspnea on exertion. Plan is to get Coumadin resumed, follow-up in the anticoagulation clinic. Increase Coreg to 6.25 mg twice daily for now to achieve better heart rate control. We will get an echocardiogram to reassess LVEF in light of his history of nonischemic cardiomyopathy. Next step will likely be TEE guided cardioversion to restore sinus rhythm.  2. History of paroxysmal atrial fibrillation. This has generally been well controlled on medications including amiodarone, Coreg, and Coumadin. Follow-up TSH and LFTs.  3. History of nonischemic cardiomyopathy, LVEF had improved to 50% as of 2013.  Current medicines were reviewed at length with the patient today.   Orders Placed This Encounter  Procedures  . TSH  . Hepatic function panel  . EKG 12-Lead  . Echocardiogram    Disposition: FU with me in 2 weeks.   Signed, Jonelle Sidle, MD, [facility] 10/27/2014 9:47  AM    [facility] at [facility] [address], [place], [place] [address] Phone: [phone]; Fax: [phone]

## 2014-10-29 ENCOUNTER — Other Ambulatory Visit: Payer: Self-pay | Admitting: Family Medicine

## 2014-10-29 ENCOUNTER — Ambulatory Visit (HOSPITAL_COMMUNITY)
Admission: RE | Admit: 2014-10-29 | Discharge: 2014-10-29 | Disposition: A | Discharge status: Home / Self Care | Payer: 59 | Source: Ambulatory Visit | Attending: Cardiology | Admitting: Cardiology

## 2014-10-29 DIAGNOSIS — E785 Hyperlipidemia, unspecified: Secondary | ICD-10-CM | POA: Insufficient documentation

## 2014-10-29 DIAGNOSIS — I4891 Unspecified atrial fibrillation: Secondary | ICD-10-CM | POA: Insufficient documentation

## 2014-10-29 DIAGNOSIS — Z87891 Personal history of nicotine dependence: Secondary | ICD-10-CM | POA: Diagnosis not present

## 2014-10-29 DIAGNOSIS — I481 Persistent atrial fibrillation: Secondary | ICD-10-CM | POA: Diagnosis not present

## 2014-10-29 DIAGNOSIS — I083 Combined rheumatic disorders of mitral, aortic and tricuspid valves: Secondary | ICD-10-CM | POA: Diagnosis not present

## 2014-10-29 DIAGNOSIS — I428 Other cardiomyopathies: Secondary | ICD-10-CM

## 2014-10-29 DIAGNOSIS — I429 Cardiomyopathy, unspecified: Secondary | ICD-10-CM

## 2014-10-29 DIAGNOSIS — I4819 Other persistent atrial fibrillation: Secondary | ICD-10-CM

## 2014-10-29 MED ORDER — FAMOTIDINE 20 MG PO TABS: 20.0000 mg | ORAL_TABLET | Freq: Every day | ORAL | Status: DC

## 2014-10-29 MED ORDER — METHOCARBAMOL 500 MG PO TABS: ORAL_TABLET | ORAL | Status: DC

## 2014-10-29 NOTE — Progress Notes (Signed)
*  PRELIMINARY RESULTS* Echocardiogram 2D Echocardiogram has been performed.  David [name] 10/29/2014, 9:23 AM

## 2014-11-01 ENCOUNTER — Other Ambulatory Visit: Payer: Self-pay | Admitting: *Deleted

## 2014-11-01 ENCOUNTER — Telehealth: Payer: Self-pay | Admitting: *Deleted

## 2014-11-01 NOTE — Telephone Encounter (Signed)
Requesting refill on hydrocodone.   °

## 2014-11-03 ENCOUNTER — Ambulatory Visit (INDEPENDENT_AMBULATORY_CARE_PROVIDER_SITE_OTHER): Payer: 59 | Admitting: *Deleted

## 2014-11-03 DIAGNOSIS — Z5181 Encounter for therapeutic drug level monitoring: Secondary | ICD-10-CM

## 2014-11-03 DIAGNOSIS — I4891 Unspecified atrial fibrillation: Secondary | ICD-10-CM

## 2014-11-03 DIAGNOSIS — I480 Paroxysmal atrial fibrillation: Secondary | ICD-10-CM | POA: Diagnosis not present

## 2014-11-03 LAB — POCT INR: INR: 1.7

## 2014-11-08 ENCOUNTER — Telehealth: Payer: Self-pay | Admitting: *Deleted

## 2014-11-08 ENCOUNTER — Other Ambulatory Visit: Payer: Self-pay | Admitting: *Deleted

## 2014-11-08 MED ORDER — HYDROCODONE-ACETAMINOPHEN 7.5-325 MG PO TABS: 1.0000 | ORAL_TABLET | ORAL | Status: DC | PRN

## 2014-11-08 NOTE — Telephone Encounter (Signed)
Submitted referral thru [facility] compass with referral number [id number] to Dr. Diona Browner Cardiologist [facility]  Type of referral: Consult and treat  Number of visits:6  Start Date:10/27/14  End Date: 04/29/15  Dx:Z51.81- Encounter for therapeutic drug level monitoring      I48.91- unspecified atrial fibrillation  Copy has been faxed to Dr. Diona Browner office for review/records

## 2014-11-08 NOTE — Telephone Encounter (Signed)
Submitted referral thru [facility] compass to Dr Wyline Mood Cardiologist with referral number [id number]  Type of referral: Consult and treat  Number of visits:6  Start Date: 11/02/14  End Date: 05/05/15  Dx: Z51.81- Encounter for therapeutic drug level monitoring  Copy has been faxed to Dr. Wyline Mood office for records/Review

## 2014-11-09 ENCOUNTER — Other Ambulatory Visit: Payer: Self-pay | Admitting: *Deleted

## 2014-11-09 MED ORDER — HYDROCODONE-ACETAMINOPHEN 5-325 MG PO TABS: 1.0000 | ORAL_TABLET | Freq: Four times a day (QID) | ORAL | Status: DC | PRN

## 2014-11-09 NOTE — Telephone Encounter (Signed)
Prescription available, patient aware  

## 2014-11-10 ENCOUNTER — Ambulatory Visit (INDEPENDENT_AMBULATORY_CARE_PROVIDER_SITE_OTHER): Payer: 59 | Admitting: *Deleted

## 2014-11-10 DIAGNOSIS — I480 Paroxysmal atrial fibrillation: Secondary | ICD-10-CM | POA: Diagnosis not present

## 2014-11-10 DIAGNOSIS — Z5181 Encounter for therapeutic drug level monitoring: Secondary | ICD-10-CM | POA: Diagnosis not present

## 2014-11-10 DIAGNOSIS — I4891 Unspecified atrial fibrillation: Secondary | ICD-10-CM

## 2014-11-10 LAB — POCT INR: INR: 2.1

## 2014-11-11 ENCOUNTER — Telehealth: Payer: Self-pay

## 2014-11-11 LAB — HEPATIC FUNCTION PANEL
ALT: 28 U/L (ref 9–46)
AST: 23 U/L (ref 10–35)
Albumin: 3.9 g/dL (ref 3.6–5.1)
Alkaline Phosphatase: 85 U/L (ref 40–115)
Bilirubin, Direct: 0.1 mg/dL (ref <=0.2)
Indirect Bilirubin: 0.2 mg/dL (ref 0.2–1.2)
Total Bilirubin: 0.3 mg/dL (ref 0.2–1.2)
Total Protein: 6.9 g/dL (ref 6.1–8.1)

## 2014-11-11 LAB — TSH: TSH: 3.035 u[IU]/mL (ref 0.350–4.500)

## 2014-11-11 NOTE — Telephone Encounter (Signed)
PT made aware

## 2014-11-11 NOTE — Telephone Encounter (Signed)
-----   Message from Jonelle Sidle, MD sent at 11/11/2014  7:45 AM EDT ----- Reviewed. Normal LFTs and TSH. Continue current medications.

## 2014-11-12 ENCOUNTER — Other Ambulatory Visit: Payer: Self-pay | Admitting: Cardiology

## 2014-11-12 ENCOUNTER — Encounter: Payer: Self-pay | Admitting: Cardiology

## 2014-11-12 ENCOUNTER — Encounter: Payer: Self-pay | Admitting: *Deleted

## 2014-11-12 ENCOUNTER — Telehealth: Payer: Self-pay | Admitting: Cardiology

## 2014-11-12 ENCOUNTER — Ambulatory Visit (INDEPENDENT_AMBULATORY_CARE_PROVIDER_SITE_OTHER): Payer: 59 | Admitting: Cardiology

## 2014-11-12 VITALS — BP 127/85 | HR 95 | Ht 71.0 in | Wt 229.8 lb

## 2014-11-12 DIAGNOSIS — I429 Cardiomyopathy, unspecified: Secondary | ICD-10-CM

## 2014-11-12 DIAGNOSIS — I4819 Other persistent atrial fibrillation: Secondary | ICD-10-CM

## 2014-11-12 DIAGNOSIS — I481 Persistent atrial fibrillation: Secondary | ICD-10-CM | POA: Diagnosis not present

## 2014-11-12 DIAGNOSIS — I4891 Unspecified atrial fibrillation: Secondary | ICD-10-CM | POA: Diagnosis not present

## 2014-11-12 NOTE — Patient Instructions (Signed)
Your physician recommends that you continue on your current medications as directed. Please refer to the Current Medication list given to you today. Your physician has requested that you have a TEE/Cardioversion. During a TEE, sound waves are used to create images of your heart. It provides your doctor with information about the size and shape of your heart and how well your heart's chambers and valves are working. In this test, a transducer is attached to the end of a flexible tube that is guided down you throat and into your esophagus (the tube leading from your mouth to your stomach) to get a more detailed image of your heart. Once the TEE has determined that a blood clot is not present, the cardioversion begins. Electrical Cardioversion uses a jolt of electricity to your heart either through paddles or wired patches attached to your chest. This is a controlled, usually prescheduled, procedure. This procedure is done at the hospital and you are not awake during the procedure. You usually go home the day of the procedure. Please see the instruction sheet given to you today for more information. Your physician recommends that you schedule a follow-up appointment in: 1 month at the [place] office.

## 2014-11-12 NOTE — Progress Notes (Signed)
  Cardiology Office Note  Date: 11/12/2014   ID: David [name], DOB 08/17/1960, MRN [id number]  PCP: [place], KAWANTA, MD  Primary Cardiologist: Samuel McDowell, MD   Chief Complaint  Patient presents with  . Atrial Fibrillation    History of Present Illness: David [name] is a 54 y.o. male last seen in the office in July. At that time he was noted to be back in atrial fibrillation, also associated with progressive shortness of breath. He has a history of nonischemic cardiomyopathy, and follow-up echocardiogram fortunately showed continued normal LVEF at 55-60%. He had run out of Coumadin briefly, and this was resumed with the plan to pursue a TEE guided cardioversion ultimately.  He is on Coumadin now, followed in the anticoagulation clinic, recent INR 2.1. Additional lab work outlined below.  He reports no change in functional capacity with shortness of breath as before. He is not having any chest pain. He reports compliance with his medications, and would like to try and get his procedure scheduled for next week when his wife is off work.   Past Medical History  Diagnosis Date  . Atrial fibrillation   . Alcohol abuse   . TIA (transient ischemic attack)   . Nonischemic cardiomyopathy     LVEF 20-25% improved to 50% on medical therapy  . Chronic kidney disease, stage 2, mildly decreased GFR   . GERD (gastroesophageal reflux disease)   . Arthritis     Bilateral knees   . Gout     Past Surgical History  Procedure Laterality Date  . Arthroscopic knee surgery Right 1987  . Cardioversion  10/08/2011    Procedure: CARDIOVERSION;  Surgeon: Samuel G McDowell, MD;  Location: [facility]S;  Service: Cardiovascular;  Laterality: N/A;  Done in PACU  . Cardioversion  01/10/2012    Procedure: CARDIOVERSION;  Surgeon: Gregg W Taylor, MD;  Location: [facility] ENDOSCOPY;  Service: Cardiovascular;  Laterality: N/A;  . Total knee arthroplasty Left 01/16/2013    Procedure: TOTAL KNEE  ARTHROPLASTY;  Surgeon: Stanley E Harrison, MD;  Location: [facility] ORS;  Service: Orthopedics;  Laterality: Left;    Current Outpatient Prescriptions  Medication Sig Dispense Refill  . allopurinol (ZYLOPRIM) 300 MG tablet Take 300 mg by mouth daily.     . amiodarone (PACERONE) 100 MG tablet Take 1 tablet (100 mg total) by mouth daily. 90 tablet 3  . carvedilol (COREG) 6.25 MG tablet Take 1 tablet (6.25 mg total) by mouth 2 (two) times daily. 180 tablet 3  . famotidine (PEPCID) 20 MG tablet Take 1 tablet (20 mg total) by mouth daily. 30 tablet 3  . furosemide (LASIX) 40 MG tablet TAKE ONE-HALF TABLET BY MOUTH EVERY MORNING. 15 tablet 0  . HYDROcodone-acetaminophen (NORCO/VICODIN) 5-325 MG per tablet Take 1 tablet by mouth every 6 (six) hours as needed for moderate pain. 120 tablet 0  . KLOR-CON M10 10 MEQ tablet TAKE ONE TABLET BY MOUTH ONCE DAILY IN THE MORNING 30 tablet 6  . lisinopril (PRINIVIL,ZESTRIL) 5 MG tablet TAKE ONE TABLET BY MOUTH DAILY. 30 tablet 6  . methocarbamol (ROBAXIN) 500 MG tablet TAKE 1 TABLET BY MOUTH EVERY 8 HOURS AS NEEDED FOR MUSCLE SPASM. 60 tablet 1  . potassium chloride (K-DUR) 10 MEQ tablet TAKE ONE TABLET BY MOUTH EVERY MORNING. 30 tablet 0  . warfarin (COUMADIN) 5 MG tablet Take 1 tablet daily except 1 1/2 tablet on Mondays or as directed 45 tablet 3   No current facility-administered medications for   this visit.    Allergies:  Azithromycin   Social History: The patient  reports that he quit smoking about 4 years ago. His smoking use included Cigarettes. He has quit using smokeless tobacco. His smokeless tobacco use included Snuff. He reports that he drinks about 0.6 oz of alcohol per week. He reports that he does not use illicit drugs.   ROS:  Please see the history of present illness. Otherwise, complete review of systems is positive for none.  All other systems are reviewed and negative.   Physical Exam: VS:  BP 127/85 mmHg  Pulse 95  Ht 5' 11" (1.803 m)  Wt  229 lb 12.8 oz (104.237 kg)  BMI 32.06 kg/m2  SpO2 98%, BMI Body mass index is 32.06 kg/(m^2).  Wt Readings from Last 3 Encounters:  11/12/14 229 lb 12.8 oz (104.237 kg)  10/27/14 230 lb (104.327 kg)  03/22/14 228 lb (103.42 kg)     Appears comfortable at rest.  HEENT: Conjunctiva and lids normal, oropharynx clear.  Neck: Supple, no elevated JVP or carotid bruits, no thyromegaly.  Lungs: Clear to auscultation, nonlabored breathing at rest.  Cardiac: Irregularly irregular, no S3 or significant systolic murmur, no pericardial rub.  Abdomen: Soft, nontender, bowel sounds present, no guarding or rebound.  Extremities: No pitting edema, distal pulses 2+.  Skin: Warm and dry. Musculoskeletal: No kyphosis. Neuropsychiatric: Alert and oriented 3, affect appropriate.   ECG: ECG is ordered today and shows rate-controlled atrial fibrillation.   Recent Labwork: 02/17/2014: BUN 22; Creat 1.34; Hemoglobin 14.1; Platelets 397; Potassium 4.7; Sodium 138 11/10/2014: ALT 28; AST 23; TSH 3.035   Other Studies Reviewed Today:  Echocardiogram 10/29/2014: Study Conclusions  - Left ventricle: The cavity size was normal. There was mild concentric hypertrophy. Systolic function was normal. The estimated ejection fraction was in the range of 55% to 60%. Wall motion was normal; there were no regional wall motion abnormalities. - Aortic valve: There was mild regurgitation. - Left atrium: The atrium was mildly dilated.  Assessment and Plan:  1. Persistent atrial fibrillation, symptomatic. Last continue current regimen including Coreg, low-dose amiodarone, and Coumadin (was temporarily out of the medication with subtherapeutic INR in late July). Recent INR 2.1. He will be scheduled for a TEE guided cardioversion next Wednesday at 10:30 AM with Dr. Koneswaran.   2. History of nonischemic cardiopathy with normalization of LVEF. This remains the case by recent follow-up  echocardiogram.  Current medicines were reviewed with the patient today.   Orders Placed This Encounter  Procedures  . EKG 12-Lead    Disposition: FU with me in 1 month.   Signed, Samuel G. McDowell, MD, FACC 11/12/2014 11:49 AM     [facility] [place] [address], [place], [place] [address] Phone: [phone]; Fax: [phone]  

## 2014-11-12 NOTE — Telephone Encounter (Signed)
TEE/DCCV on medications dx: Atrial Fibrillation on Wednesday, November 17, 2014 @10 :30 am with Dr. Purvis Sheffield. Checking percert

## 2014-11-17 ENCOUNTER — Encounter (HOSPITAL_COMMUNITY)
Admission: RE | Disposition: A | Discharge status: Home / Self Care | Payer: Self-pay | Source: Ambulatory Visit | Attending: Cardiovascular Disease

## 2014-11-17 ENCOUNTER — Ambulatory Visit (HOSPITAL_COMMUNITY): Payer: 59 | Admitting: Anesthesiology

## 2014-11-17 ENCOUNTER — Ambulatory Visit (HOSPITAL_COMMUNITY)
Admission: RE | Admit: 2014-11-17 | Discharge: 2014-11-17 | Disposition: A | Discharge status: Home / Self Care | Payer: 59 | Source: Ambulatory Visit | Attending: Cardiovascular Disease | Admitting: Cardiovascular Disease

## 2014-11-17 ENCOUNTER — Encounter (HOSPITAL_COMMUNITY): Payer: Self-pay | Admitting: *Deleted

## 2014-11-17 ENCOUNTER — Ambulatory Visit (HOSPITAL_COMMUNITY): Payer: 59

## 2014-11-17 DIAGNOSIS — Z87891 Personal history of nicotine dependence: Secondary | ICD-10-CM | POA: Insufficient documentation

## 2014-11-17 DIAGNOSIS — F1010 Alcohol abuse, uncomplicated: Secondary | ICD-10-CM | POA: Insufficient documentation

## 2014-11-17 DIAGNOSIS — I481 Persistent atrial fibrillation: Secondary | ICD-10-CM | POA: Diagnosis not present

## 2014-11-17 DIAGNOSIS — K219 Gastro-esophageal reflux disease without esophagitis: Secondary | ICD-10-CM | POA: Diagnosis not present

## 2014-11-17 DIAGNOSIS — N182 Chronic kidney disease, stage 2 (mild): Secondary | ICD-10-CM | POA: Diagnosis not present

## 2014-11-17 DIAGNOSIS — Z96652 Presence of left artificial knee joint: Secondary | ICD-10-CM | POA: Diagnosis not present

## 2014-11-17 DIAGNOSIS — Z8673 Personal history of transient ischemic attack (TIA), and cerebral infarction without residual deficits: Secondary | ICD-10-CM | POA: Diagnosis not present

## 2014-11-17 DIAGNOSIS — M170 Bilateral primary osteoarthritis of knee: Secondary | ICD-10-CM | POA: Insufficient documentation

## 2014-11-17 DIAGNOSIS — M109 Gout, unspecified: Secondary | ICD-10-CM | POA: Insufficient documentation

## 2014-11-17 DIAGNOSIS — Z7901 Long term (current) use of anticoagulants: Secondary | ICD-10-CM | POA: Diagnosis not present

## 2014-11-17 DIAGNOSIS — Z79899 Other long term (current) drug therapy: Secondary | ICD-10-CM | POA: Insufficient documentation

## 2014-11-17 DIAGNOSIS — I4819 Other persistent atrial fibrillation: Secondary | ICD-10-CM

## 2014-11-17 HISTORY — PX: TEE WITHOUT CARDIOVERSION: SHX5443

## 2014-11-17 HISTORY — PX: CARDIOVERSION: SHX1299

## 2014-11-17 LAB — CBC WITH DIFFERENTIAL/PLATELET
Basophils Absolute: 0 10*3/uL (ref 0.0–0.1)
Basophils Relative: 0 % (ref 0–1)
Eosinophils Absolute: 0.1 10*3/uL (ref 0.0–0.7)
Eosinophils Relative: 2 % (ref 0–5)
HCT: 34.9 % — ABNORMAL LOW (ref 39.0–52.0)
Hemoglobin: 10.9 g/dL — ABNORMAL LOW (ref 13.0–17.0)
Lymphocytes Relative: 31 % (ref 12–46)
Lymphs Abs: 2 10*3/uL (ref 0.7–4.0)
MCH: 25.3 pg — ABNORMAL LOW (ref 26.0–34.0)
MCHC: 31.2 g/dL (ref 30.0–36.0)
MCV: 81 fL (ref 78.0–100.0)
Monocytes Absolute: 0.7 10*3/uL (ref 0.1–1.0)
Monocytes Relative: 10 % (ref 3–12)
Neutro Abs: 3.8 10*3/uL (ref 1.7–7.7)
Neutrophils Relative %: 57 % (ref 43–77)
Platelets: 287 10*3/uL (ref 150–400)
RBC: 4.31 MIL/uL (ref 4.22–5.81)
RDW: 18 % — ABNORMAL HIGH (ref 11.5–15.5)
WBC: 6.6 10*3/uL (ref 4.0–10.5)

## 2014-11-17 LAB — PROTIME-INR
INR: 2.75 — ABNORMAL HIGH (ref 0.00–1.49)
Prothrombin Time: 28.6 seconds — ABNORMAL HIGH (ref 11.6–15.2)

## 2014-11-17 LAB — BASIC METABOLIC PANEL
Anion gap: 9 (ref 5–15)
BUN: 11 mg/dL (ref 6–20)
CO2: 23 mmol/L (ref 22–32)
Calcium: 9 mg/dL (ref 8.9–10.3)
Chloride: 108 mmol/L (ref 101–111)
Creatinine, Ser: 1.37 mg/dL — ABNORMAL HIGH (ref 0.61–1.24)
GFR calc Af Amer: >60 mL/min (ref >60)
GFR calc non Af Amer: 58 mL/min — ABNORMAL LOW (ref >60)
Glucose, Bld: 91 mg/dL (ref 65–99)
Potassium: 4.1 mmol/L (ref 3.5–5.1)
Sodium: 140 mmol/L (ref 135–145)

## 2014-11-17 SURGERY — ECHOCARDIOGRAM, TRANSESOPHAGEAL
Anesthesia: Monitor Anesthesia Care

## 2014-11-17 MED ORDER — GLYCOPYRROLATE 0.2 MG/ML IJ SOLN
INTRAMUSCULAR | Status: AC
  Filled 2014-11-17: qty 1

## 2014-11-17 MED ORDER — PROPOFOL INFUSION 10 MG/ML OPTIME
INTRAVENOUS | Status: DC | PRN
  Administered 2014-11-17: 150 ug/kg/min via INTRAVENOUS
  Administered 2014-11-17: 11:00:00 via INTRAVENOUS

## 2014-11-17 MED ORDER — GLYCOPYRROLATE 0.2 MG/ML IJ SOLN
INTRAMUSCULAR | Status: DC | PRN
  Administered 2014-11-17: 0.2 mg via INTRAVENOUS

## 2014-11-17 MED ORDER — MIDAZOLAM HCL 2 MG/2ML IJ SOLN
INTRAMUSCULAR | Status: AC
  Filled 2014-11-17: qty 4

## 2014-11-17 MED ORDER — LIDOCAINE VISCOUS 2 % MT SOLN
OROMUCOSAL | Status: AC
  Filled 2014-11-17: qty 15

## 2014-11-17 MED ORDER — FENTANYL CITRATE (PF) 100 MCG/2ML IJ SOLN: 25.0000 ug | INTRAMUSCULAR | Status: DC | PRN

## 2014-11-17 MED ORDER — MIDAZOLAM HCL 5 MG/5ML IJ SOLN
INTRAMUSCULAR | Status: DC | PRN
  Administered 2014-11-17: 2 mg via INTRAVENOUS

## 2014-11-17 MED ORDER — LACTATED RINGERS IV SOLN
INTRAVENOUS | Status: DC
  Administered 2014-11-17 (×2): via INTRAVENOUS

## 2014-11-17 MED ORDER — ATROPINE SULFATE 1 MG/ML IJ SOLN
INTRAMUSCULAR | Status: AC
  Filled 2014-11-17: qty 1

## 2014-11-17 MED ORDER — SODIUM CHLORIDE 0.9 % IV SOLN: INTRAVENOUS | Status: DC

## 2014-11-17 MED ORDER — ONDANSETRON HCL 4 MG/2ML IJ SOLN: 4.0000 mg | Freq: Once | INTRAMUSCULAR | Status: DC | PRN

## 2014-11-17 MED ORDER — ONDANSETRON HCL 4 MG/2ML IJ SOLN
INTRAMUSCULAR | Status: AC
Start: 2014-11-17 — End: 2014-11-17
  Filled 2014-11-17: qty 2

## 2014-11-17 MED ORDER — FENTANYL CITRATE (PF) 100 MCG/2ML IJ SOLN
INTRAMUSCULAR | Status: AC
  Filled 2014-11-17: qty 4

## 2014-11-17 MED ORDER — ONDANSETRON HCL 4 MG/2ML IJ SOLN
4.0000 mg | Freq: Once | INTRAMUSCULAR | Status: AC
  Administered 2014-11-17: 4 mg via INTRAVENOUS

## 2014-11-17 MED ORDER — FENTANYL CITRATE (PF) 100 MCG/2ML IJ SOLN
INTRAMUSCULAR | Status: AC
Start: 2014-11-17 — End: 2014-11-17
  Filled 2014-11-17: qty 2

## 2014-11-17 MED ORDER — FENTANYL CITRATE (PF) 100 MCG/2ML IJ SOLN
25.0000 ug | INTRAMUSCULAR | Status: AC
  Administered 2014-11-17 (×2): 25 ug via INTRAVENOUS

## 2014-11-17 NOTE — Discharge Instructions (Signed)
Atrial Fibrillation °Atrial fibrillation is a type of irregular heart rhythm (arrhythmia). During atrial fibrillation, the upper chambers of the heart (atria) quiver continuously in a chaotic pattern. This causes an irregular and often rapid heart rate.  °Atrial fibrillation is the result of the heart becoming overloaded with disorganized signals that tell it to beat. These signals are normally released one at a time by a part of the right atrium called the sinoatrial node. They then travel from the atria to the lower chambers of the heart (ventricles), causing the atria and ventricles to contract and pump blood as they pass. In atrial fibrillation, parts of the atria outside of the sinoatrial node also release these signals. This results in two problems. First, the atria receive so many signals that they do not have time to fully contract. Second, the ventricles, which can only receive one signal at a time, beat irregularly and out of rhythm with the atria.  °There are three types of atrial fibrillation:  °· Paroxysmal. Paroxysmal atrial fibrillation starts suddenly and stops on its own within a week. °· Persistent. Persistent atrial fibrillation lasts for more than a week. It may stop on its own or with treatment. °· Permanent. Permanent atrial fibrillation does not go away. Episodes of atrial fibrillation may lead to permanent atrial fibrillation. °Atrial fibrillation can prevent your heart from pumping blood normally. It increases your risk of stroke and can lead to heart failure.  °CAUSES  °· Heart conditions, including a heart attack, heart failure, coronary artery disease, and heart valve conditions.   °· Inflammation of the sac that surrounds the heart (pericarditis). °· Blockage of an artery in the lungs (pulmonary embolism). °· Pneumonia or other infections. °· Chronic lung disease. °· Thyroid problems, especially if the thyroid is overactive (hyperthyroidism). °· Caffeine, excessive alcohol use, and use  of some illegal drugs.   °· Use of some medicines, including certain decongestants and diet pills. °· Heart surgery.   °· Birth defects.   °Sometimes, no cause can be found. When this happens, the atrial fibrillation is called lone atrial fibrillation. The risk of complications from atrial fibrillation increases if you have lone atrial fibrillation and you are age 60 years or older. °RISK FACTORS °· Heart failure. °· Coronary artery disease. °· Diabetes mellitus.   °· High blood pressure (hypertension).   °· Obesity.   °· Other arrhythmias.   °· Increased age. °SIGNS AND SYMPTOMS  °· A feeling that your heart is beating rapidly or irregularly.   °· A feeling of discomfort or pain in your chest.   °· Shortness of breath.   °· Sudden light-headedness or weakness.   °· Getting tired easily when exercising.   °· Urinating more often than normal (mainly when atrial fibrillation first begins).   °In paroxysmal atrial fibrillation, symptoms may start and suddenly stop. °DIAGNOSIS  °Your health care provider may be able to detect atrial fibrillation when taking your pulse. Your health care provider may have you take a test called an ambulatory electrocardiogram (ECG). An ECG records your heartbeat patterns over a 24-hour period. You may also have other tests, such as: °· Transthoracic echocardiogram (TTE). During echocardiography, sound waves are used to evaluate how blood flows through your heart. °· Transesophageal echocardiogram (TEE). °· Stress test. There is more than one type of stress test. If a stress test is needed, ask your health care provider about which type is best for you. °· Chest X-ray exam. °· Blood tests. °· Computed tomography (CT). °TREATMENT  °Treatment may include: °· Treating any underlying conditions. For example, if you   have an overactive thyroid, treating the condition may correct atrial fibrillation.  Taking medicine. Medicines may be given to control a rapid heart rate or to prevent blood  clots, heart failure, or a stroke.  Having a procedure to correct the rhythm of the heart:  Electrical cardioversion. During electrical cardioversion, a controlled, low-energy shock is delivered to the heart through your skin. If you have chest pain, very low blood pressure, or sudden heart failure, this procedure may need to be done as an emergency.  Catheter ablation. During this procedure, heart tissues that send the signals that cause atrial fibrillation are destroyed.  Surgical ablation. During this surgery, thin lines of heart tissue that carry the abnormal signals are destroyed. This procedure can either be an open-heart surgery or a minimally invasive surgery. With the minimally invasive surgery, small cuts are made to access the heart instead of a large opening.  Pulmonary venous isolation. During this surgery, tissue around the veins that carry blood from the lungs (pulmonary veins) is destroyed. This tissue is thought to carry the abnormal signals. HOME CARE INSTRUCTIONS   Take medicines only as directed by your health care provider. Some medicines can make atrial fibrillation worse or recur.  If blood thinners were prescribed by your health care provider, take them exactly as directed. Too much blood-thinning medicine can cause bleeding. If you take too little, you will not have the needed protection against stroke and other problems.  Perform blood tests at home if directed by your health care provider. Perform blood tests exactly as directed.  Quit smoking if you smoke.  Do not drink alcohol.  Do not drink caffeinated beverages such as coffee, soda, and some teas. You may drink decaffeinated coffee, soda, or tea.   Maintain a healthy weight.Do not use diet pills unless your health care provider approves. They may make heart problems worse.   Follow diet instructions as directed by your health care provider.  Exercise regularly as directed by your health care  provider.  Keep all follow-up visits as directed by your health care provider. This is important. PREVENTION  The following substances can cause atrial fibrillation to recur:   Caffeinated beverages.  Alcohol.  Certain medicines, especially those used for breathing problems.  Certain herbs and herbal medicines, such as those containing ephedra or ginseng.  Illegal drugs, such as cocaine and amphetamines. Sometimes medicines are given to prevent atrial fibrillation from recurring. Proper treatment of any underlying condition is also important in helping prevent recurrence.  SEEK MEDICAL CARE IF:  You notice a change in the rate, rhythm, or strength of your heartbeat.  You suddenly begin urinating more frequently.  You tire more easily when exerting yourself or exercising. SEEK IMMEDIATE MEDICAL CARE IF:   You have chest pain, abdominal pain, sweating, or weakness.  You feel nauseous.  You have shortness of breath.  You suddenly have swollen feet and ankles.  You feel dizzy.  Your face or limbs feel numb or weak.  You have a change in your vision or speech. MAKE SURE YOU:   Understand these instructions.  Will watch your condition.  Will get help right away if you are not doing well or get worse. Document Released: 03/26/2005 Document Revised: 08/10/2013 Document Reviewed: 05/06/2012 [facility] Patient Information 2015 [place], [place]. This information is not intended to replace advice given to you by your health care provider. Make sure you discuss any questions you have with your health care provider.  PATIENT INSTRUCTIONS POST-ANESTHESIA  IMMEDIATELY  FOLLOWING SURGERY:  Do not drive or operate machinery for the first twenty four hours after surgery.  Do not make any important decisions for twenty four hours after surgery or while taking narcotic pain medications or sedatives.  If you develop intractable nausea and vomiting or a severe headache please notify your  doctor immediately.  FOLLOW-UP:  Please make an appointment with your surgeon as instructed. You do not need to follow up with anesthesia unless specifically instructed to do so.  WOUND CARE INSTRUCTIONS (if applicable):  Keep a dry clean dressing on the anesthesia/puncture wound site if there is drainage.  Once the wound has quit draining you may leave it open to air.  Generally you should leave the bandage intact for twenty four hours unless there is drainage.  If the epidural site drains for more than 36-48 hours please call the anesthesia department.  QUESTIONS?:  Please feel free to call your physician or the hospital operator if you have any questions, and they will be happy to assist you.

## 2014-11-17 NOTE — Interval H&P Note (Signed)
History and Physical Interval Note:  Reviewed. No changes to H&P. Will plan to proceed with TEE and cardioversion.  11/17/2014 8:50 AM  David [name]  has presented today for surgery, with the diagnosis of a-fib  The various methods of treatment have been discussed with the patient and family. After consideration of risks, benefits and other options for treatment, the patient has consented to  Procedure(s): TRANSESOPHAGEAL ECHOCARDIOGRAM (TEE) (N/A) CARDIOVERSION (N/A) as a surgical intervention .  The patient's history has been reviewed, patient examined, no change in status, stable for surgery.  I have reviewed the patient's chart and labs.  Questions were answered to the patient's satisfaction.     Prentice Docker A

## 2014-11-17 NOTE — Transfer of Care (Signed)
Immediate Anesthesia Transfer of Care Note  Patient: David [name]  Procedure(s) Performed: Procedure(s): TRANSESOPHAGEAL ECHOCARDIOGRAM (TEE) (N/A) CARDIOVERSION (N/A)  Patient Location: PACU  Anesthesia Type:MAC  Level of Consciousness: awake, alert , oriented and patient cooperative  Airway & Oxygen Therapy: Patient Spontanous Breathing and Patient connected to face mask oxygen  Post-op Assessment: Report given to RN and Post -op Vital signs reviewed and stable  Post vital signs: Reviewed and stable  Last Vitals:  Filed Vitals:   11/17/14 0940  BP: 142/89  Pulse:   Temp:   Resp: 21    Complications: No apparent anesthesia complications

## 2014-11-17 NOTE — Anesthesia Postprocedure Evaluation (Signed)
  Anesthesia Post-op Note  Patient: David [name]  Procedure(s) Performed: Procedure(s): TRANSESOPHAGEAL ECHOCARDIOGRAM (TEE) (N/A) CARDIOVERSION (N/A)  Patient Location: PACU  Anesthesia Type:MAC  Level of Consciousness: awake, alert , oriented and patient cooperative  Airway and Oxygen Therapy: Patient Spontanous Breathing and Patient connected to face mask oxygen  Post-op Pain: none  Post-op Assessment: Post-op Vital signs reviewed, Patient's Cardiovascular Status Stable, Respiratory Function Stable, Patent Airway, No signs of Nausea or vomiting and Pain level controlled              Post-op Vital Signs: Reviewed and stable  Last Vitals:  Filed Vitals:   11/17/14 0940  BP: 142/89  Pulse:   Temp:   Resp: 21    Complications: No apparent anesthesia complications

## 2014-11-17 NOTE — Addendum Note (Signed)
Addendum  created 11/17/14 1144 by Earleen Newport, CRNA   Modules edited: Charges VN

## 2014-11-17 NOTE — Anesthesia Procedure Notes (Signed)
Procedure Name: MAC Date/Time: 11/17/2014 10:03 AM Performed by: Pernell Dupre, AMY A Pre-anesthesia Checklist: Patient identified, Emergency Drugs available, Suction available, Patient being monitored and Timeout performed Patient Re-evaluated:Patient Re-evaluated prior to inductionOxygen Delivery Method: Simple face mask

## 2014-11-17 NOTE — H&P (View-Only) (Signed)
Cardiology Office Note  Date: 11/12/2014   ID: David [name], DOB Sep 29, 1960, MRN [id number]  PCP: Milinda Antis, MD  Primary Cardiologist: Nona Dell, MD   Chief Complaint  Patient presents with  . Atrial Fibrillation    History of Present Illness: David [name] is a 54 y.o. male last seen in the office in July. At that time he was noted to be back in atrial fibrillation, also associated with progressive shortness of breath. He has a history of nonischemic cardiomyopathy, and follow-up echocardiogram fortunately showed continued normal LVEF at 55-60%. He had run out of Coumadin briefly, and this was resumed with the plan to pursue a TEE guided cardioversion ultimately.  He is on Coumadin now, followed in the anticoagulation clinic, recent INR 2.1. Additional lab work outlined below.  He reports no change in functional capacity with shortness of breath as before. He is not having any chest pain. He reports compliance with his medications, and would like to try and get his procedure scheduled for next week when his wife is off work.   Past Medical History  Diagnosis Date  . Atrial fibrillation   . Alcohol abuse   . TIA (transient ischemic attack)   . Nonischemic cardiomyopathy     LVEF 20-25% improved to 50% on medical therapy  . Chronic kidney disease, stage 2, mildly decreased GFR   . GERD (gastroesophageal reflux disease)   . Arthritis     Bilateral knees   . Gout     Past Surgical History  Procedure Laterality Date  . Arthroscopic knee surgery Right 1987  . Cardioversion  10/08/2011    Procedure: CARDIOVERSION;  Surgeon: Jonelle Sidle, MD;  Location: [facility];  Service: Cardiovascular;  Laterality: N/A;  Done in PACU  . Cardioversion  01/10/2012    Procedure: CARDIOVERSION;  Surgeon: Marinus Maw, MD;  Location: [facility];  Service: Cardiovascular;  Laterality: N/A;  . Total knee arthroplasty Left 01/16/2013    Procedure: TOTAL KNEE  ARTHROPLASTY;  Surgeon: Vickki Hearing, MD;  Location: [facility];  Service: Orthopedics;  Laterality: Left;    Current Outpatient Prescriptions  Medication Sig Dispense Refill  . allopurinol (ZYLOPRIM) 300 MG tablet Take 300 mg by mouth daily.     Marland Kitchen amiodarone (PACERONE) 100 MG tablet Take 1 tablet (100 mg total) by mouth daily. 90 tablet 3  . carvedilol (COREG) 6.25 MG tablet Take 1 tablet (6.25 mg total) by mouth 2 (two) times daily. 180 tablet 3  . famotidine (PEPCID) 20 MG tablet Take 1 tablet (20 mg total) by mouth daily. 30 tablet 3  . furosemide (LASIX) 40 MG tablet TAKE ONE-HALF TABLET BY MOUTH EVERY MORNING. 15 tablet 0  . HYDROcodone-acetaminophen (NORCO/VICODIN) 5-325 MG per tablet Take 1 tablet by mouth every 6 (six) hours as needed for moderate pain. 120 tablet 0  . KLOR-CON M10 10 MEQ tablet TAKE ONE TABLET BY MOUTH ONCE DAILY IN THE MORNING 30 tablet 6  . lisinopril (PRINIVIL,ZESTRIL) 5 MG tablet TAKE ONE TABLET BY MOUTH DAILY. 30 tablet 6  . methocarbamol (ROBAXIN) 500 MG tablet TAKE 1 TABLET BY MOUTH EVERY 8 HOURS AS NEEDED FOR MUSCLE SPASM. 60 tablet 1  . potassium chloride (K-DUR) 10 MEQ tablet TAKE ONE TABLET BY MOUTH EVERY MORNING. 30 tablet 0  . warfarin (COUMADIN) 5 MG tablet Take 1 tablet daily except 1 1/2 tablet on Mondays or as directed 45 tablet 3   No current facility-administered medications for  this visit.    Allergies:  Azithromycin   Social History: The patient  reports that he quit smoking about 4 years ago. His smoking use included Cigarettes. He has quit using smokeless tobacco. His smokeless tobacco use included Snuff. He reports that he drinks about 0.6 oz of alcohol per week. He reports that he does not use illicit drugs.   ROS:  Please see the history of present illness. Otherwise, complete review of systems is positive for none.  All other systems are reviewed and negative.   Physical Exam: VS:  BP 127/85 mmHg  Pulse 95  Ht 5\' 11"  (1.803 m)  Wt  229 lb 12.8 oz (104.237 kg)  BMI 32.06 kg/m2  SpO2 98%, BMI Body mass index is 32.06 kg/(m^2).  Wt Readings from Last 3 Encounters:  11/12/14 229 lb 12.8 oz (104.237 kg)  10/27/14 230 lb (104.327 kg)  03/22/14 228 lb (103.42 kg)     Appears comfortable at rest.  HEENT: Conjunctiva and lids normal, oropharynx clear.  Neck: Supple, no elevated JVP or carotid bruits, no thyromegaly.  Lungs: Clear to auscultation, nonlabored breathing at rest.  Cardiac: Irregularly irregular, no S3 or significant systolic murmur, no pericardial rub.  Abdomen: Soft, nontender, bowel sounds present, no guarding or rebound.  Extremities: No pitting edema, distal pulses 2+.  Skin: Warm and dry. Musculoskeletal: No kyphosis. Neuropsychiatric: Alert and oriented 3, affect appropriate.   ECG: ECG is ordered today and shows rate-controlled atrial fibrillation.   Recent Labwork: 02/17/2014: BUN 22; Creat 1.34; Hemoglobin 14.1; Platelets 397; Potassium 4.7; Sodium 138 11/10/2014: ALT 28; AST 23; TSH 3.035   Other Studies Reviewed Today:  Echocardiogram 10/29/2014: Study Conclusions  - Left ventricle: The cavity size was normal. There was mild concentric hypertrophy. Systolic function was normal. The estimated ejection fraction was in the range of 55% to 60%. Wall motion was normal; there were no regional wall motion abnormalities. - Aortic valve: There was mild regurgitation. - Left atrium: The atrium was mildly dilated.  Assessment and Plan:  1. Persistent atrial fibrillation, symptomatic. Last continue current regimen including Coreg, low-dose amiodarone, and Coumadin (was temporarily out of the medication with subtherapeutic INR in late July). Recent INR 2.1. He will be scheduled for a TEE guided cardioversion next Wednesday at 10:30 AM with Dr. Purvis Sheffield.   2. History of nonischemic cardiopathy with normalization of LVEF. This remains the case by recent follow-up  echocardiogram.  Current medicines were reviewed with the patient today.   Orders Placed This Encounter  Procedures  . EKG 12-Lead    Disposition: FU with me in 1 month.   Signed, Jonelle Sidle, MD, [facility] 11/12/2014 11:49 AM    [facility] at [facility] [address] [place], [place], [place] [address] Phone: [phone]; Fax: [phone]

## 2014-11-17 NOTE — OR Nursing (Signed)
Electrical Cardioversion Nursing Note David [name] [id number] [birth year]   Patient placed on cardiac monitor, pulse oximetry and supplemental oxygen.  Cardioverted 1 times at 150J. Post procedure EKG showsNSR. Patient did tolerate procedure well.  [name] 11/17/2014

## 2014-11-17 NOTE — Anesthesia Preprocedure Evaluation (Signed)
Anesthesia Evaluation  Patient identified by MRN, date of birth, ID band Patient awake    Reviewed: Allergy & Precautions, H&P , NPO status , Patient's Chart, lab work & pertinent test results  History of Anesthesia Complications Negative for: history of anesthetic complications  Airway Mallampati: II  TM Distance: >3 FB     Dental  (+) Teeth Intact   Pulmonary neg pulmonary ROS, former smoker,  breath sounds clear to auscultation        Cardiovascular +CHF (EF=20% ) + dysrhythmias Atrial Fibrillation Rhythm:Regular Rate:Normal     Neuro/Psych TIACVA, No Residual Symptoms    GI/Hepatic GERD-  Medicated and Controlled,(+)     substance abuse  alcohol use,   Endo/Other    Renal/GU Renal InsufficiencyRenal disease     Musculoskeletal   Abdominal   Peds  Hematology   Anesthesia Other Findings   Reproductive/Obstetrics                             Anesthesia Physical Anesthesia Plan  ASA: III  Anesthesia Plan: MAC   Post-op Pain Management:    Induction: Intravenous  Airway Management Planned: Simple Face Mask  Additional Equipment:   Intra-op Plan:   Post-operative Plan:   Informed Consent: I have reviewed the patients History and Physical, chart, labs and discussed the procedure including the risks, benefits and alternatives for the proposed anesthesia with the patient or authorized representative who has indicated his/her understanding and acceptance.     Plan Discussed with:   Anesthesia Plan Comments:         Anesthesia Quick Evaluation

## 2014-11-17 NOTE — Procedures (Signed)
Brief procedure note: TEE demonstrated normal LV systolic function with moderate aortic regurgitation. No evidence of left atrial appendage thrombus.  Direct current cardioversion using 150J was then successfully administered and rhythm converted from atrial fibrillation to normal sinus.  There were no peri-procedural complications.

## 2014-11-17 NOTE — OR Nursing (Signed)
Electrical Cardioversion Procedure Note David [name] [id number] [birth year]  Procedure: Electrical Cardioversion Indications:  Atrial Fibrillation  Procedure Details Consent: Risks of procedure as well as the alternatives and risks of each were explained to the (patient/caregiver).  Consent for procedure obtained. Time Out: Verified patient identification, verified procedure, site/side was marked, verified correct patient position, special equipment/implants available, medications/allergies/relevent history reviewed, required imaging and test results available.  10:45  Patient placed on cardiac monitor, pulse oximetry, supplemental oxygen as necessary.  Sedation given: By Amy CRNA Pacer pads placed anterior and posterior chest.  Cardioverted 1 time(s).  Cardioverted at 150J.  Evaluation Findings: Post procedure EKG shows: NSR Complications: None Patient did tolerate procedure well.   David [name] 11/17/2014, 11:51 AM

## 2014-11-17 NOTE — OR Nursing (Signed)
Ring  Removed and given to family member by patient

## 2014-11-18 ENCOUNTER — Encounter (HOSPITAL_COMMUNITY): Payer: Self-pay | Admitting: Cardiovascular Disease

## 2014-12-04 ENCOUNTER — Other Ambulatory Visit: Payer: Self-pay | Admitting: Cardiology

## 2014-12-06 ENCOUNTER — Other Ambulatory Visit: Payer: Self-pay | Admitting: *Deleted

## 2014-12-06 ENCOUNTER — Telehealth: Payer: Self-pay | Admitting: Orthopedic Surgery

## 2014-12-06 ENCOUNTER — Ambulatory Visit (INDEPENDENT_AMBULATORY_CARE_PROVIDER_SITE_OTHER): Payer: 59 | Admitting: Pharmacist

## 2014-12-06 DIAGNOSIS — Z5181 Encounter for therapeutic drug level monitoring: Secondary | ICD-10-CM

## 2014-12-06 DIAGNOSIS — I4819 Other persistent atrial fibrillation: Secondary | ICD-10-CM

## 2014-12-06 DIAGNOSIS — I481 Persistent atrial fibrillation: Secondary | ICD-10-CM

## 2014-12-06 DIAGNOSIS — I480 Paroxysmal atrial fibrillation: Secondary | ICD-10-CM | POA: Diagnosis not present

## 2014-12-06 LAB — POCT INR: INR: 2.7

## 2014-12-06 MED ORDER — HYDROCODONE-ACETAMINOPHEN 5-325 MG PO TABS: 1.0000 | ORAL_TABLET | Freq: Four times a day (QID) | ORAL | Status: DC | PRN

## 2014-12-06 MED ORDER — HYDROCODONE-ACETAMINOPHEN 5-325 MG PO TABS
1.0000 | ORAL_TABLET | Freq: Four times a day (QID) | ORAL | Status: DC | PRN
Start: 2014-12-06 — End: 2014-12-06

## 2014-12-06 NOTE — Telephone Encounter (Signed)
Patient called to request refill on medication: HYDROcodone-acetaminophen (NORCO/VICODIN) 5-325 MG per tablet [[id number] - ph# [phone]

## 2014-12-06 NOTE — Telephone Encounter (Signed)
Patient picked up Rx

## 2014-12-06 NOTE — Telephone Encounter (Signed)
Prescription available, patient aware  

## 2014-12-07 ENCOUNTER — Other Ambulatory Visit: Payer: Self-pay | Admitting: Cardiology

## 2014-12-15 ENCOUNTER — Ambulatory Visit (INDEPENDENT_AMBULATORY_CARE_PROVIDER_SITE_OTHER): Payer: 59 | Admitting: Cardiology

## 2014-12-15 ENCOUNTER — Encounter: Payer: Self-pay | Admitting: Cardiology

## 2014-12-15 VITALS — BP 132/84 | HR 68 | Ht 71.0 in | Wt 230.8 lb

## 2014-12-15 DIAGNOSIS — I429 Cardiomyopathy, unspecified: Secondary | ICD-10-CM | POA: Diagnosis not present

## 2014-12-15 DIAGNOSIS — I480 Paroxysmal atrial fibrillation: Secondary | ICD-10-CM

## 2014-12-15 DIAGNOSIS — I38 Endocarditis, valve unspecified: Secondary | ICD-10-CM | POA: Diagnosis not present

## 2014-12-15 NOTE — Patient Instructions (Signed)
Your physician recommends that you schedule a follow-up appointment in: 4 months Dr McDowell     Your physician recommends that you continue on your current medications as directed. Please refer to the Current Medication list given to you today.     Thank you for choosing [facility] !         

## 2014-12-15 NOTE — Progress Notes (Signed)
Cardiology Office Note  Date: 12/15/2014   ID: David [name], DOB 30-Dec-1960, MRN [id number]  PCP: Milinda Antis, MD  Primary Cardiologist: Nona Dell, MD   Chief Complaint  Patient presents with  . Atrial Fibrillation  . Follow-up cardioversion    History of Present Illness: David [name] is a 54 y.o. male presenting for follow-up after a recent TEE guided cardioversion in August for management of persistent atrial fibrillation. This procedure was successful in restoring normal sinus rhythm. He states that he does feel better at this point. We reviewed the findings of his TEE regarding mild mitral and moderate aortic regurgitation. Suspect that neither of these is symptomatic however. He continues on the medications outlined below. Reports no bleeding problems on Coumadin, and had a therapeutic INR recently.   Past Medical History  Diagnosis Date  . Atrial fibrillation   . Alcohol abuse   . TIA (transient ischemic attack)   . Nonischemic cardiomyopathy     LVEF 20-25% improved to 50% on medical therapy  . Chronic kidney disease, stage 2, mildly decreased GFR   . GERD (gastroesophageal reflux disease)   . Arthritis     Bilateral knees   . Gout     Current Outpatient Prescriptions  Medication Sig Dispense Refill  . allopurinol (ZYLOPRIM) 300 MG tablet Take 300 mg by mouth daily.     Marland Kitchen amiodarone (PACERONE) 100 MG tablet Take 1 tablet (100 mg total) by mouth daily. 90 tablet 3  . carvedilol (COREG) 6.25 MG tablet Take 1 tablet (6.25 mg total) by mouth 2 (two) times daily. 180 tablet 3  . famotidine (PEPCID) 20 MG tablet Take 1 tablet (20 mg total) by mouth daily. 30 tablet 3  . furosemide (LASIX) 40 MG tablet TAKE ONE-HALF TABLET BY MOUTH EVERY MORNING. 15 tablet 0  . HYDROcodone-acetaminophen (NORCO/VICODIN) 5-325 MG per tablet Take 1 tablet by mouth every 6 (six) hours as needed for moderate pain. 120 tablet 0  . KLOR-CON M10 10 MEQ tablet TAKE ONE  TABLET BY MOUTH ONCE DAILY IN THE MORNING 30 tablet 6  . lisinopril (PRINIVIL,ZESTRIL) 5 MG tablet TAKE ONE TABLET BY MOUTH DAILY. 30 tablet 6  . methocarbamol (ROBAXIN) 500 MG tablet TAKE 1 TABLET BY MOUTH EVERY 8 HOURS AS NEEDED FOR MUSCLE SPASM. 60 tablet 1  . potassium chloride (K-DUR) 10 MEQ tablet TAKE ONE TABLET BY MOUTH EVERY MORNING. 30 tablet 6  . warfarin (COUMADIN) 5 MG tablet Take 1-1.5 tablets (5-7.5 mg total) by mouth daily. 45 tablet 3   No current facility-administered medications for this visit.    Allergies:  Azithromycin   Social History: The patient  reports that he quit smoking about 4 years ago. His smoking use included Cigarettes. He has quit using smokeless tobacco. His smokeless tobacco use included Snuff. He reports that he drinks about 0.6 oz of alcohol per week. He reports that he does not use illicit drugs.   ROS:  Please see the history of present illness. Otherwise, complete review of systems is positive for none.  All other systems are reviewed and negative.   Physical Exam: VS:  BP 132/84 mmHg  Pulse 68  Ht 5\' 11"  (1.803 m)  Wt 230 lb 12.8 oz (104.69 kg)  BMI 32.20 kg/m2  SpO2 97%, BMI Body mass index is 32.2 kg/(m^2).  Wt Readings from Last 3 Encounters:  12/15/14 230 lb 12.8 oz (104.69 kg)  11/17/14 229 lb (103.874 kg)  11/12/14 229 lb  12.8 oz (104.237 kg)     General: Patient appears comfortable at rest. HEENT: Conjunctiva and lids normal, oropharynx clear with moist mucosa. Neck: Supple, no elevated JVP or carotid bruits, no thyromegaly. Lungs: Clear to auscultation, nonlabored breathing at rest. Cardiac: Regular rate and rhythm, no S3 or significant systolic murmur, no pericardial rub. Abdomen: Soft, nontender, no hepatomegaly, bowel sounds present, no guarding or rebound. Extremities: No pitting edema, distal pulses 2+.  ECG: ECG is not ordered today.  Recent Labwork: 11/10/2014: ALT 28; AST 23; TSH 3.035 11/17/2014: BUN 11; Creatinine,  Ser 1.37*; Hemoglobin 10.9*; Platelets 287; Potassium 4.1; Sodium 140     Component Value Date/Time   CHOL 205* 02/17/2014 1204   TRIG 86 02/17/2014 1204   HDL 70 02/17/2014 1204   CHOLHDL 2.9 02/17/2014 1204   VLDL 17 02/17/2014 1204   LDLCALC 118* 02/17/2014 1204    Other Studies Reviewed Today:  TEE guided cardioversion 11/17/2014: Study Conclusions  - Left ventricle: Systolic function was normal. The estimated ejection fraction was in the range of 60% to 65%. - Aortic valve: Trileaflet; mildly thickened leaflets. There was moderate regurgitation. - Mitral valve: There was mild regurgitation. Three separate trivial jets were noted, cumulatively mild in severity. - Left atrium: The atrium was dilated. No evidence of thrombus in the atrial cavity or appendage. Pulsed Doppler velocities within the appendage were normal.  Impressions:  - Successful cardioversion. No cardiac source of emboli was indentified.  Recommendations:  1. Continue anticoagulation. 2. Continue antiarrhythmic therapy.  ASSESSMENT AND PLAN:  1. Paroxysmal to persistent atrial fibrillation status post successful TEE guided cardioversion. He remains in sinus rhythm on examination today and does feel better symptomatically. Continue medications and observation.  2. History of nonischemic myopathy with normalization of LVEF on medical therapy and with rhythm control.  3. Mild mitral and moderate aortic regurgitation, continue to follow.  Current medicines were reviewed at length with the patient today.  Disposition: FU with me in 4 months.   Signed, Jonelle Sidle, MD, [facility] 12/15/2014 2:05 PM    [facility] at [facility] [address], [place], [place] [address] Phone: [phone]; Fax: [phone]

## 2014-12-29 NOTE — Telephone Encounter (Signed)
Medication refill

## 2014-12-30 ENCOUNTER — Other Ambulatory Visit: Payer: Self-pay | Admitting: *Deleted

## 2014-12-30 NOTE — Telephone Encounter (Signed)
Not due this week

## 2014-12-30 NOTE — Telephone Encounter (Signed)
NOT DUE DONT GIVE

## 2014-12-30 NOTE — Telephone Encounter (Signed)
PATIENT IS CALLING REQUESTING A REFILL ON HYDROcodone-acetaminophen (NORCO/VICODIN) 5-325 MG per tablet PLEASE ADVISE?

## 2015-01-03 ENCOUNTER — Telehealth: Payer: Self-pay | Admitting: Orthopedic Surgery

## 2015-01-03 ENCOUNTER — Other Ambulatory Visit: Payer: Self-pay | Admitting: *Deleted

## 2015-01-03 ENCOUNTER — Ambulatory Visit (INDEPENDENT_AMBULATORY_CARE_PROVIDER_SITE_OTHER): Payer: 59 | Admitting: *Deleted

## 2015-01-03 DIAGNOSIS — I4891 Unspecified atrial fibrillation: Secondary | ICD-10-CM

## 2015-01-03 DIAGNOSIS — I480 Paroxysmal atrial fibrillation: Secondary | ICD-10-CM

## 2015-01-03 DIAGNOSIS — Z5181 Encounter for therapeutic drug level monitoring: Secondary | ICD-10-CM

## 2015-01-03 LAB — POCT INR: INR: 3

## 2015-01-03 MED ORDER — HYDROCODONE-ACETAMINOPHEN 5-325 MG PO TABS: 1.0000 | ORAL_TABLET | Freq: Four times a day (QID) | ORAL | Status: DC | PRN

## 2015-01-03 NOTE — Telephone Encounter (Signed)
Duplicate message. 

## 2015-01-03 NOTE — Telephone Encounter (Signed)
Prescription picked up today, 01/03/15

## 2015-01-03 NOTE — Telephone Encounter (Signed)
Prescription available, called patient, no anwer

## 2015-01-17 ENCOUNTER — Telehealth: Payer: Self-pay | Admitting: Cardiology

## 2015-01-17 NOTE — Telephone Encounter (Signed)
Patient's wife states that patient needs note stating that he can not work. / tg

## 2015-01-17 NOTE — Telephone Encounter (Signed)
Will forward request to Dr Diona Browner

## 2015-01-17 NOTE — Telephone Encounter (Signed)
David [name] has paroxysmal atrial fibrillation that has generally been controlled on medical therapy. Recent TEE cardioversion was successful as well and showed normal LVEF. He does not have a cardiac reason in my opinion to state that he cannot work.

## 2015-01-18 NOTE — Telephone Encounter (Signed)
Wife states she wanted to know if he can work,I told her that he can

## 2015-02-01 ENCOUNTER — Telehealth: Payer: Self-pay | Admitting: Orthopedic Surgery

## 2015-02-01 NOTE — Telephone Encounter (Signed)
Patient called to request refill on pain medication, Hydrocodone 5-325.  His ph# is [phone].

## 2015-02-02 ENCOUNTER — Other Ambulatory Visit: Payer: Self-pay | Admitting: *Deleted

## 2015-02-02 MED ORDER — HYDROCODONE-ACETAMINOPHEN 5-325 MG PO TABS: 1.0000 | ORAL_TABLET | Freq: Four times a day (QID) | ORAL | Status: DC | PRN

## 2015-02-03 NOTE — Telephone Encounter (Signed)
Patient picked up Rx

## 2015-02-03 NOTE — Telephone Encounter (Signed)
Prescription is available for pick up, patient is aware  

## 2015-02-10 ENCOUNTER — Ambulatory Visit: Payer: BC Managed Care – PPO | Admitting: Orthopedic Surgery

## 2015-02-14 ENCOUNTER — Other Ambulatory Visit: Payer: Self-pay | Admitting: Family Medicine

## 2015-02-14 NOTE — Telephone Encounter (Signed)
Ok to refill 

## 2015-02-15 NOTE — Telephone Encounter (Signed)
Okay to refill? 

## 2015-02-15 NOTE — Telephone Encounter (Signed)
Prescription sent to pharmacy.

## 2015-02-16 ENCOUNTER — Encounter: Payer: Self-pay | Admitting: *Deleted

## 2015-02-21 ENCOUNTER — Ambulatory Visit (INDEPENDENT_AMBULATORY_CARE_PROVIDER_SITE_OTHER): Payer: 59 | Admitting: *Deleted

## 2015-02-21 DIAGNOSIS — I480 Paroxysmal atrial fibrillation: Secondary | ICD-10-CM | POA: Diagnosis not present

## 2015-02-21 DIAGNOSIS — Z5181 Encounter for therapeutic drug level monitoring: Secondary | ICD-10-CM | POA: Diagnosis not present

## 2015-02-21 DIAGNOSIS — I4891 Unspecified atrial fibrillation: Secondary | ICD-10-CM

## 2015-02-21 LAB — POCT INR: INR: 1.5

## 2015-02-24 ENCOUNTER — Ambulatory Visit (INDEPENDENT_AMBULATORY_CARE_PROVIDER_SITE_OTHER): Payer: 59 | Admitting: Orthopedic Surgery

## 2015-02-24 ENCOUNTER — Ambulatory Visit (INDEPENDENT_AMBULATORY_CARE_PROVIDER_SITE_OTHER): Payer: 59

## 2015-02-24 ENCOUNTER — Encounter: Payer: Self-pay | Admitting: Orthopedic Surgery

## 2015-02-24 VITALS — BP 143/97 | Ht 70.0 in | Wt 231.0 lb

## 2015-02-24 DIAGNOSIS — Z96652 Presence of left artificial knee joint: Secondary | ICD-10-CM

## 2015-02-24 NOTE — Progress Notes (Signed)
Patient ID: David [name], male   DOB: 1960/04/14, 54 y.o.   MRN: [id number]  Post op annual TKA   Chief Complaint  Patient presents with  . Follow-up    1 yr follow up Left TKA, DOS 01/16/13    HPI David [name] is a 54 y.o. male.  He is 2 years postop from his left total knee is only complaint is that he has some numbness on the lateral side of the inferior part of his left knee incision. He has occasional pain when the weather changes or gets cold     Past Medical History  Diagnosis Date  . Atrial fibrillation   . Alcohol abuse   . TIA (transient ischemic attack)   . Nonischemic cardiomyopathy (HCC)     LVEF 20-25% improved to 50% on medical therapy  . Chronic kidney disease, stage 2, mildly decreased GFR   . GERD (gastroesophageal reflux disease)   . Arthritis     Bilateral knees   . Gout     Past Surgical History  Procedure Laterality Date  . Arthroscopic knee surgery Right 1987  . Cardioversion  10/08/2011    Procedure: CARDIOVERSION;  Surgeon: Jonelle Sidle, MD;  Location: [facility];  Service: Cardiovascular;  Laterality: N/A;  Done in PACU  . Cardioversion  01/10/2012    Procedure: CARDIOVERSION;  Surgeon: Marinus Maw, MD;  Location: [facility] ENDOSCOPY;  Service: Cardiovascular;  Laterality: N/A;  . Total knee arthroplasty Left 01/16/2013    Procedure: TOTAL KNEE ARTHROPLASTY;  Surgeon: Vickki Hearing, MD;  Location: [facility] ORS;  Service: Orthopedics;  Laterality: Left;  . Tee without cardioversion N/A 11/17/2014    Procedure: TRANSESOPHAGEAL ECHOCARDIOGRAM (TEE);  Surgeon: Laqueta Linden, MD;  Location: [facility] ORS;  Service: Cardiovascular;  Laterality: N/A;  . Cardioversion N/A 11/17/2014    Procedure: CARDIOVERSION;  Surgeon: Laqueta Linden, MD;  Location: [facility]S;  Service: Cardiovascular;  Laterality: N/A;     Allergies  Allergen Reactions  . Azithromycin Itching    Current Outpatient Prescriptions  Medication Sig Dispense Refill  .  allopurinol (ZYLOPRIM) 300 MG tablet Take 300 mg by mouth daily.     Marland Kitchen amiodarone (PACERONE) 100 MG tablet Take 1 tablet (100 mg total) by mouth daily. 90 tablet 3  . carvedilol (COREG) 6.25 MG tablet Take 1 tablet (6.25 mg total) by mouth 2 (two) times daily. 180 tablet 3  . famotidine (PEPCID) 20 MG tablet Take 1 tablet (20 mg total) by mouth daily. 30 tablet 3  . furosemide (LASIX) 40 MG tablet TAKE ONE-HALF TABLET BY MOUTH EVERY MORNING. 15 tablet 0  . HYDROcodone-acetaminophen (NORCO/VICODIN) 5-325 MG tablet Take 1 tablet by mouth every 6 (six) hours as needed for moderate pain. 120 tablet 0  . KLOR-CON M10 10 MEQ tablet TAKE ONE TABLET BY MOUTH ONCE DAILY IN THE MORNING 30 tablet 6  . lisinopril (PRINIVIL,ZESTRIL) 5 MG tablet TAKE ONE TABLET BY MOUTH DAILY. 30 tablet 6  . methocarbamol (ROBAXIN) 500 MG tablet TAKE 1 TABLET BY MOUTH EVERY 8 HOURS AS NEEDED FOR MUSCLE SPASM. 60 tablet 0  . potassium chloride (K-DUR) 10 MEQ tablet TAKE ONE TABLET BY MOUTH EVERY MORNING. 30 tablet 6  . warfarin (COUMADIN) 5 MG tablet Take 1-1.5 tablets (5-7.5 mg total) by mouth daily. 45 tablet 3   No current facility-administered medications for this visit.    Review of Systems Review of Systems  Musculoskeletal: Positive for joint swelling and arthralgias.  Right knee pain, he would like his right total knee done     Physical Exam Blood pressure 143/97, height 5\' 10"  (1.778 m), weight 231 lb (104.781 kg).   Gen. appearance is normal there are no congenital abnormalities   The patient is oriented 3   Mood and affect are normal   Ambulation is without assistive device   Knee inspection reveals a well-healed incision with no swelling   Knee flexion goniometer measured 114 with full extension  Stability in the anteroposterior plane is normal as well as in the medial lateral plane  Motor exam reveals full extension without extensor lag   Data Reviewed KNEE XRAYS : Total knee films  show stable prosthesis without loosening  Assessment S/P LEFT TKA   Plan    X-rays right knee for preop for right total knee       Fuller Canada 02/24/2015, 2:20 PM

## 2015-02-25 ENCOUNTER — Telehealth: Payer: Self-pay | Admitting: *Deleted

## 2015-02-25 NOTE — Telephone Encounter (Signed)
Submitted referral thru [facility] compass for referral to Dr. 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

## 2015-03-07 ENCOUNTER — Other Ambulatory Visit: Payer: Self-pay | Admitting: *Deleted

## 2015-03-07 ENCOUNTER — Telehealth: Payer: Self-pay | Admitting: Orthopedic Surgery

## 2015-03-07 ENCOUNTER — Ambulatory Visit (INDEPENDENT_AMBULATORY_CARE_PROVIDER_SITE_OTHER): Payer: 59 | Admitting: *Deleted

## 2015-03-07 DIAGNOSIS — I4891 Unspecified atrial fibrillation: Secondary | ICD-10-CM | POA: Diagnosis not present

## 2015-03-07 DIAGNOSIS — Z5181 Encounter for therapeutic drug level monitoring: Secondary | ICD-10-CM | POA: Diagnosis not present

## 2015-03-07 DIAGNOSIS — I480 Paroxysmal atrial fibrillation: Secondary | ICD-10-CM

## 2015-03-07 LAB — POCT INR: INR: 2.8

## 2015-03-07 MED ORDER — HYDROCODONE-ACETAMINOPHEN 5-325 MG PO TABS: 1.0000 | ORAL_TABLET | Freq: Four times a day (QID) | ORAL | Status: DC | PRN

## 2015-03-07 NOTE — Telephone Encounter (Signed)
Patient called to request refill on medication: HYDROcodone-acetaminophen (NORCO/VICODIN) 5-325 MG tablet [[id number]] - his cell ph# is [phone]

## 2015-03-08 ENCOUNTER — Telehealth: Payer: Self-pay | Admitting: *Deleted

## 2015-03-08 NOTE — Telephone Encounter (Signed)
Prescription available, patient aware  

## 2015-03-08 NOTE — Telephone Encounter (Signed)
Submitted referral thru [facility] compass to Dr. Nona Dell, MD with referral number [phone]  Type of referral: consult and treat  Number of visits:6  Start date: 03/07/15  End date: 09/04/15  Dx: Z51.81- encounter for therapeutic drug level monitoring

## 2015-03-21 ENCOUNTER — Other Ambulatory Visit: Payer: Self-pay | Admitting: Family Medicine

## 2015-03-22 NOTE — Telephone Encounter (Signed)
Patient has been discharged from practice.  Medication refill denied 

## 2015-03-24 ENCOUNTER — Other Ambulatory Visit: Payer: Self-pay | Admitting: Cardiology

## 2015-03-24 ENCOUNTER — Other Ambulatory Visit: Payer: Self-pay

## 2015-03-24 MED ORDER — FUROSEMIDE 40 MG PO TABS: 20.0000 mg | ORAL_TABLET | Freq: Every day | ORAL | Status: DC

## 2015-03-24 NOTE — Telephone Encounter (Signed)
Refill complete 

## 2015-03-28 ENCOUNTER — Ambulatory Visit (INDEPENDENT_AMBULATORY_CARE_PROVIDER_SITE_OTHER): Payer: 59 | Admitting: *Deleted

## 2015-03-28 DIAGNOSIS — I4891 Unspecified atrial fibrillation: Secondary | ICD-10-CM

## 2015-03-28 DIAGNOSIS — I480 Paroxysmal atrial fibrillation: Secondary | ICD-10-CM

## 2015-03-28 DIAGNOSIS — Z5181 Encounter for therapeutic drug level monitoring: Secondary | ICD-10-CM | POA: Diagnosis not present

## 2015-03-28 LAB — POCT INR: INR: 1.8

## 2015-04-05 ENCOUNTER — Other Ambulatory Visit: Payer: Self-pay | Admitting: *Deleted

## 2015-04-05 MED ORDER — HYDROCODONE-ACETAMINOPHEN 5-325 MG PO TABS: 1.0000 | ORAL_TABLET | Freq: Four times a day (QID) | ORAL | Status: DC | PRN

## 2015-04-05 NOTE — Telephone Encounter (Signed)
Patient called requesting his hydrocodone to be refilled.  

## 2015-04-05 NOTE — Telephone Encounter (Signed)
Patient is aware prescription is ready.

## 2015-04-05 NOTE — Telephone Encounter (Signed)
Prescription available, called patient, no answer 

## 2015-04-05 NOTE — Telephone Encounter (Signed)
Patient collected prescription  

## 2015-04-14 ENCOUNTER — Encounter: Payer: Self-pay | Admitting: Cardiology

## 2015-04-14 ENCOUNTER — Ambulatory Visit (INDEPENDENT_AMBULATORY_CARE_PROVIDER_SITE_OTHER): Payer: BLUE CROSS/BLUE SHIELD | Admitting: Cardiology

## 2015-04-14 VITALS — BP 110/80 | HR 75 | Ht 71.0 in | Wt 224.0 lb

## 2015-04-14 DIAGNOSIS — Z79899 Other long term (current) drug therapy: Secondary | ICD-10-CM

## 2015-04-14 DIAGNOSIS — I428 Other cardiomyopathies: Secondary | ICD-10-CM

## 2015-04-14 DIAGNOSIS — I429 Cardiomyopathy, unspecified: Secondary | ICD-10-CM

## 2015-04-14 DIAGNOSIS — I480 Paroxysmal atrial fibrillation: Secondary | ICD-10-CM | POA: Diagnosis not present

## 2015-04-14 NOTE — Progress Notes (Signed)
Cardiology Office Note  Date: 04/14/2015   ID: David [name], DOB November 16, 1960, MRN [id number]  PCP: Milinda Antis, MD  Primary Cardiologist: Nona Dell, MD   Chief Complaint  Patient presents with  . Atrial Fibrillation  . Cardiomyopathy    History of Present Illness: David [name] is a 55 y.o. male last seen in September 2016. He presents for a follow-up visit with his wife today. They just moved from [place] back to [place], he reports having had shortness of breath while moving heavy furniture, otherwise no exertional chest pain or substantial palpitations.  He continues on Coumadin with follow-up in the anticoagulation clinic. He does not report any significant bleeding problems.  We discussed his medications which are outlined below. He states that he ran out of amiodarone for about a month, but is now back on all of his medications.  Past Medical History  Diagnosis Date  . Atrial fibrillation (HCC)   . Alcohol abuse   . TIA (transient ischemic attack)   . Nonischemic cardiomyopathy (HCC)     LVEF 20-25% improved to 50% on medical therapy  . Chronic kidney disease, stage 2, mildly decreased GFR   . GERD (gastroesophageal reflux disease)   . Arthritis     Bilateral knees   . Gout     Current Outpatient Prescriptions  Medication Sig Dispense Refill  . allopurinol (ZYLOPRIM) 300 MG tablet Take 300 mg by mouth daily.     Marland Kitchen amiodarone (PACERONE) 100 MG tablet Take 1 tablet (100 mg total) by mouth daily. 90 tablet 3  . carvedilol (COREG) 6.25 MG tablet Take 1 tablet (6.25 mg total) by mouth 2 (two) times daily. 180 tablet 3  . furosemide (LASIX) 40 MG tablet Take 0.5 tablets (20 mg total) by mouth daily. 30 tablet 3  . HYDROcodone-acetaminophen (NORCO/VICODIN) 5-325 MG tablet Take 1 tablet by mouth every 6 (six) hours as needed for moderate pain. 120 tablet 0  . KLOR-CON M10 10 MEQ tablet TAKE ONE TABLET BY MOUTH ONCE DAILY IN THE MORNING 30  tablet 6  . lisinopril (PRINIVIL,ZESTRIL) 5 MG tablet TAKE ONE TABLET BY MOUTH DAILY. 30 tablet 6  . methocarbamol (ROBAXIN) 500 MG tablet TAKE 1 TABLET BY MOUTH EVERY 8 HOURS AS NEEDED FOR MUSCLE SPASM. 60 tablet 0  . potassium chloride (K-DUR) 10 MEQ tablet TAKE ONE TABLET BY MOUTH EVERY MORNING. 30 tablet 6  . warfarin (COUMADIN) 5 MG tablet Take 1-1.5 tablets (5-7.5 mg total) by mouth daily. 45 tablet 3   No current facility-administered medications for this visit.   Allergies:  Azithromycin   Social History: The patient  reports that he has never smoked. He has quit using smokeless tobacco. His smokeless tobacco use included Snuff. He reports that he drinks about 0.6 oz of alcohol per week. He reports that he does not use illicit drugs.   ROS:  Please see the history of present illness. Otherwise, complete review of systems is positive for NYHA class II dyspnea.  All other systems are reviewed and negative.   Physical Exam: VS:  BP 110/80 mmHg  Pulse 75  Ht 5\' 11"  (1.803 m)  Wt 224 lb (101.606 kg)  BMI 31.26 kg/m2  SpO2 97%, BMI Body mass index is 31.26 kg/(m^2).  Wt Readings from Last 3 Encounters:  04/14/15 224 lb (101.606 kg)  02/24/15 231 lb (104.781 kg)  12/15/14 230 lb 12.8 oz (104.69 kg)    General: Patient appears comfortable at rest. HEENT:  Conjunctiva and lids normal, oropharynx clear with moist mucosa. Neck: Supple, no elevated JVP or carotid bruits, no thyromegaly. Lungs: Clear to auscultation, nonlabored breathing at rest. Cardiac: Regular rate and rhythm, no S3 or significant systolic murmur, no pericardial rub. Abdomen: Soft, nontender, no hepatomegaly, bowel sounds present, no guarding or rebound. Extremities: No pitting edema, distal pulses 2+.  ECG: ECG is not ordered today.  Recent Labwork: 11/10/2014: ALT 28; AST 23; TSH 3.035 11/17/2014: BUN 11; Creatinine, Ser 1.37*; Hemoglobin 10.9*; Platelets 287; Potassium 4.1; Sodium 140     Component Value  Date/Time   CHOL 205* 02/17/2014 1204   TRIG 86 02/17/2014 1204   HDL 70 02/17/2014 1204   CHOLHDL 2.9 02/17/2014 1204   VLDL 17 02/17/2014 1204   LDLCALC 118* 02/17/2014 1204    Assessment and Plan:  1. Paroxysmal atrial fibrillation. He has been maintaining sinus rhythm since TEE cardioversion in August 2016. Plan to continue current regimen including low-dose amiodarone, Coreg, and Coumadin.  2. History of nonischemic cardiomyopathy with subsequent normalization of LVEF in the range of 60-65% by most recent assessment.  Current medicines were reviewed with the patient today.  Disposition: FU with me in 3 months with TSH and LFTs.   Signed, Jonelle Sidle, MD, [facility] 04/14/2015 11:13 AM    [facility] at [facility] [address], [place], [place] [address] Phone: [phone]; Fax: [phone]

## 2015-04-14 NOTE — Patient Instructions (Signed)
Your physician recommends that you schedule a follow-up appointment in: 3 months with Dr Diona Browner  Your physician recommends that you continue on your current medications as directed. Please refer to the Current Medication list given to you today.    Your physician recommends that you return for lab work in: TSH and LFT'S, PLEASE GET LAB WORK JUST BEFORE NEXT VISIT IN 3 MONTHS     If you need a refill on your cardiac medications before your next appointment, please call your pharmacy.     Thank you for choosing [facility] !

## 2015-04-20 ENCOUNTER — Ambulatory Visit (INDEPENDENT_AMBULATORY_CARE_PROVIDER_SITE_OTHER): Payer: BLUE CROSS/BLUE SHIELD | Admitting: *Deleted

## 2015-04-20 DIAGNOSIS — I4891 Unspecified atrial fibrillation: Secondary | ICD-10-CM

## 2015-04-20 DIAGNOSIS — I480 Paroxysmal atrial fibrillation: Secondary | ICD-10-CM | POA: Diagnosis not present

## 2015-04-20 DIAGNOSIS — Z5181 Encounter for therapeutic drug level monitoring: Secondary | ICD-10-CM | POA: Diagnosis not present

## 2015-04-20 LAB — POCT INR: INR: 2

## 2015-04-26 ENCOUNTER — Other Ambulatory Visit: Payer: Self-pay | Admitting: Family Medicine

## 2015-04-26 NOTE — Telephone Encounter (Signed)
ok 

## 2015-04-26 NOTE — Telephone Encounter (Signed)
To MD

## 2015-04-26 NOTE — Telephone Encounter (Signed)
Prescription sent to pharmacy.

## 2015-04-26 NOTE — Telephone Encounter (Signed)
Ok to refill 

## 2015-04-28 NOTE — Telephone Encounter (Signed)
ok 

## 2015-05-05 ENCOUNTER — Encounter: Payer: Self-pay | Admitting: Orthopedic Surgery

## 2015-05-05 ENCOUNTER — Telehealth: Payer: Self-pay | Admitting: *Deleted

## 2015-05-05 ENCOUNTER — Other Ambulatory Visit: Payer: Self-pay | Admitting: *Deleted

## 2015-05-05 MED ORDER — HYDROCODONE-ACETAMINOPHEN 5-325 MG PO TABS: 1.0000 | ORAL_TABLET | Freq: Four times a day (QID) | ORAL | Status: DC | PRN

## 2015-05-05 NOTE — Telephone Encounter (Signed)
PATIENT CALLED REQUESTING HYDROCODONE TO BE REFILLED. PLEASE ADVISE

## 2015-05-05 NOTE — Telephone Encounter (Signed)
Prescription available, patient aware  

## 2015-05-18 ENCOUNTER — Ambulatory Visit (INDEPENDENT_AMBULATORY_CARE_PROVIDER_SITE_OTHER): Payer: BLUE CROSS/BLUE SHIELD | Admitting: *Deleted

## 2015-05-18 DIAGNOSIS — I480 Paroxysmal atrial fibrillation: Secondary | ICD-10-CM | POA: Diagnosis not present

## 2015-05-18 DIAGNOSIS — I4891 Unspecified atrial fibrillation: Secondary | ICD-10-CM

## 2015-05-18 DIAGNOSIS — Z5181 Encounter for therapeutic drug level monitoring: Secondary | ICD-10-CM

## 2015-05-18 LAB — POCT INR: INR: 2.7

## 2015-05-19 IMAGING — CT CT HEAD W/O CM
4 of 5 series · 15 of 47 positions shown, 16 images · non-contrast
Comparison: None.

CLINICAL DATA: Fall down stairs.  Abrasions 2 right forehead.

EXAM:
CT HEAD WITHOUT CONTRAST
CT CERVICAL SPINE WITHOUT CONTRAST
TECHNIQUE: Multidetector CT imaging of the head and cervical spine was
performed following the standard protocol without intravenous
contrast. Multiplanar CT image reconstructions of the cervical spine
were also generated.

[Series 2: headseq 4.8 h37s · axial · 0.45mm/px · z∈[+322,+392]mm · 3 of 30 slices shown, 4 images]
[im 8/30  brain]
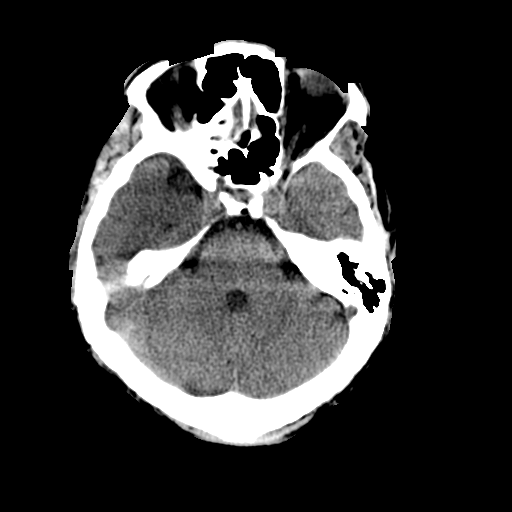
[im 8/30  bone]
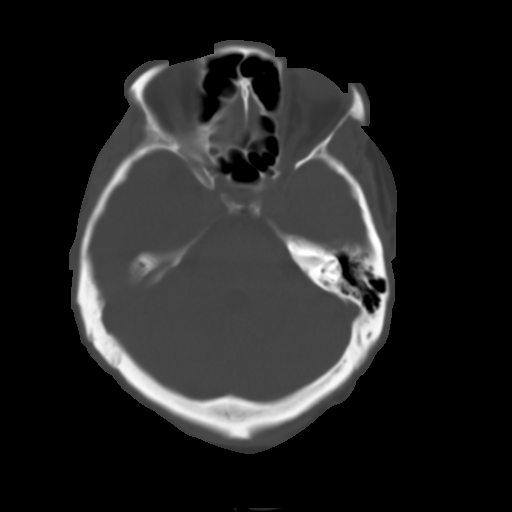
[im 15/30  brain]
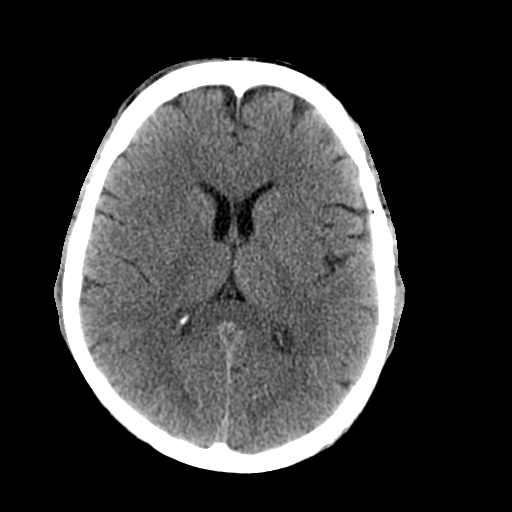
[im 22/30  brain]
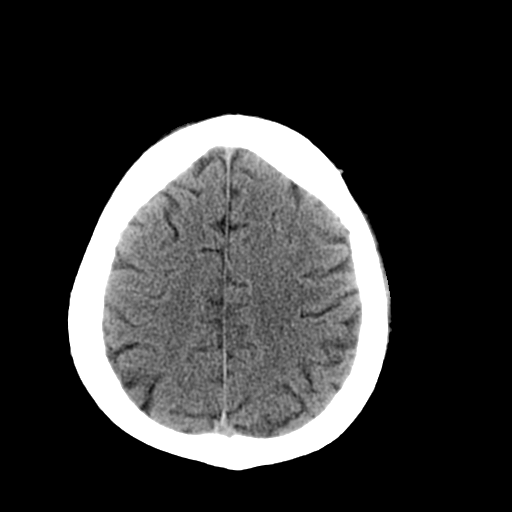

[Series 7: sagittal bone 2.0 · sagittal · 0.24mm/px · 3 of 60 slices shown]
[im 20/60  brain]
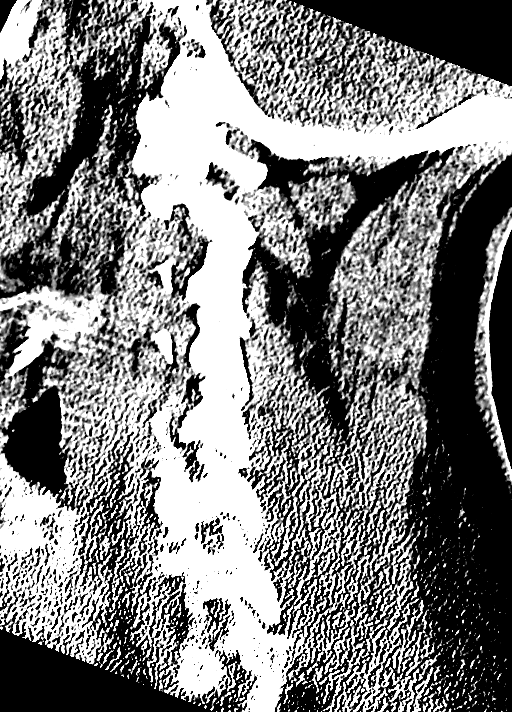
[im 30/60  brain]
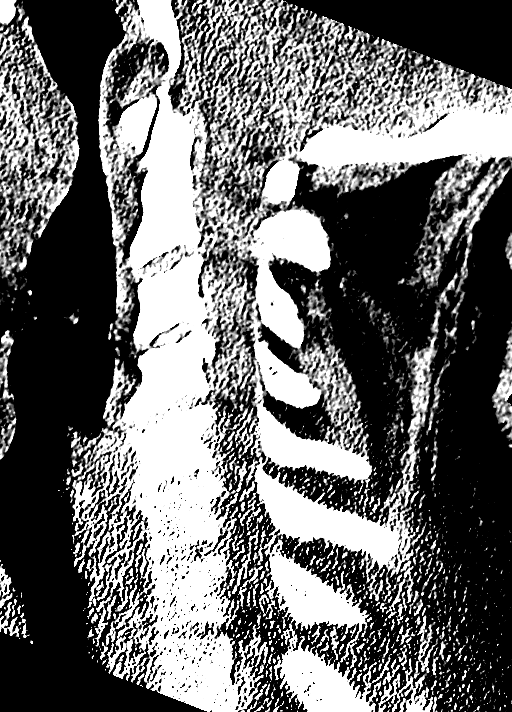
[im 40/60  brain]
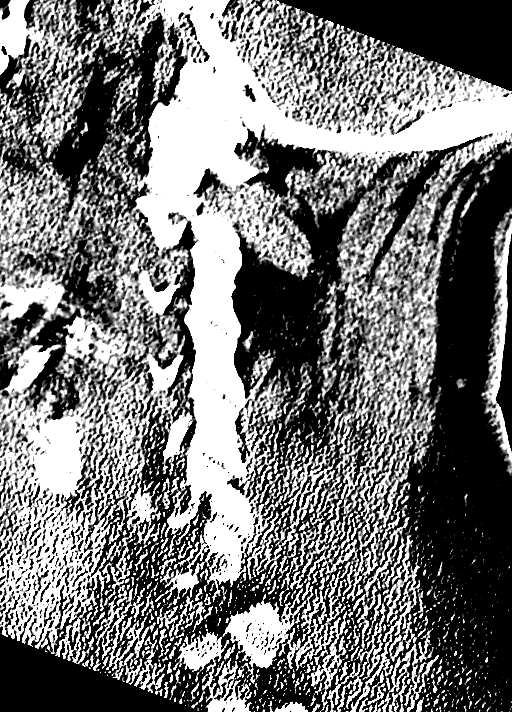

[Series 8: coronal bone 2.0 · coronal · 0.26mm/px · 3 of 61 slices shown]
[im 21/61  brain]
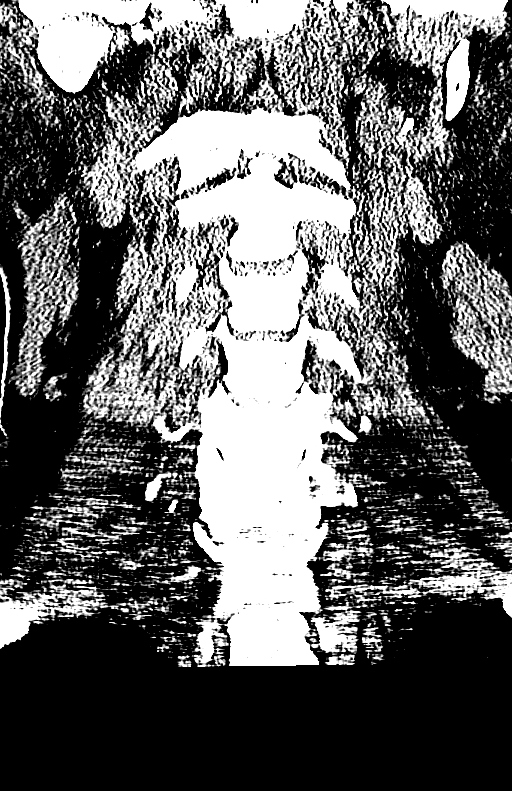
[im 27/61  brain]
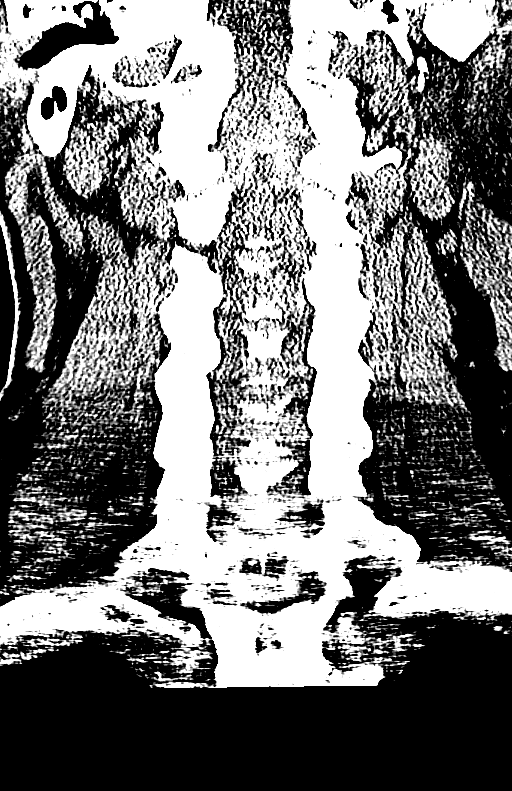
[im 34/61  brain]
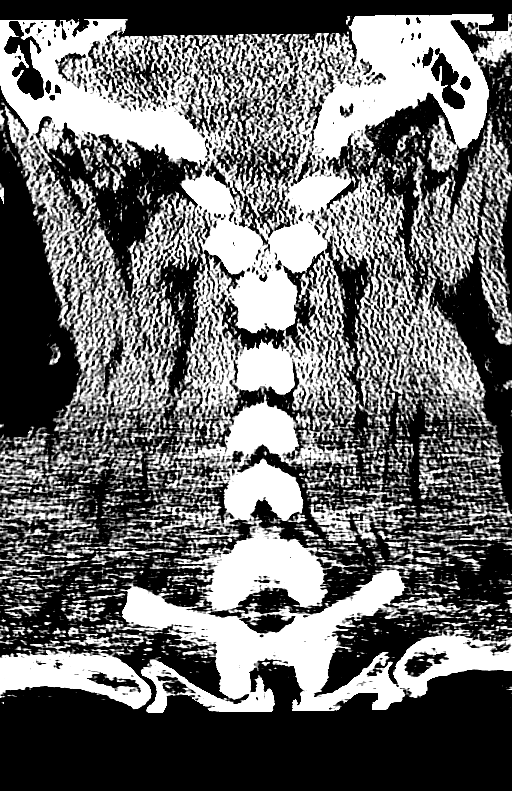

[Series 9: axial bone 2.0 · axial · 0.21mm/px · z∈[+119,+213]mm · 6 of 89 slices shown]
[im 8/89  bone]
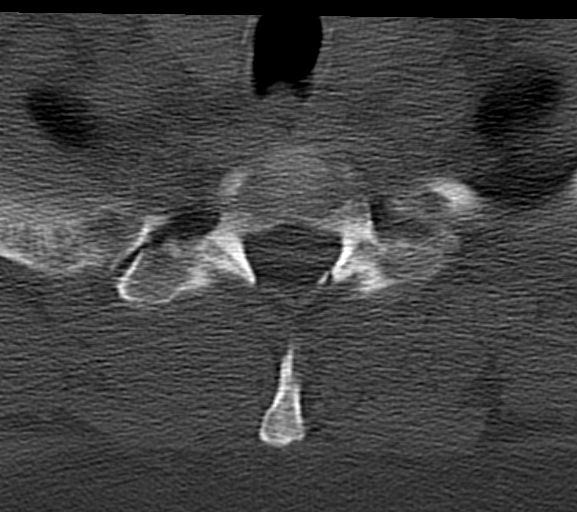
[im 23/89  bone]
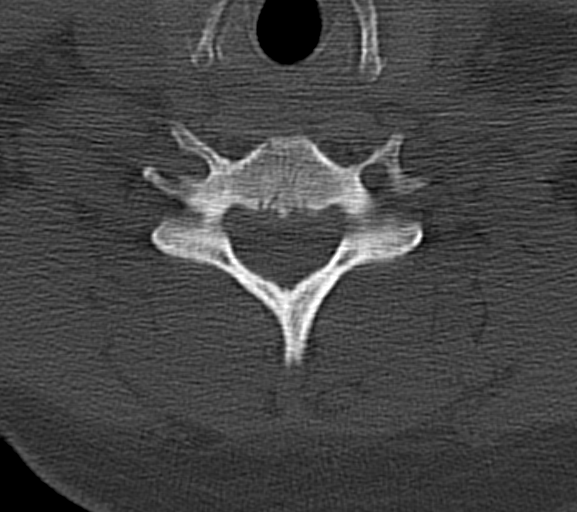
[im 30/89  bone]
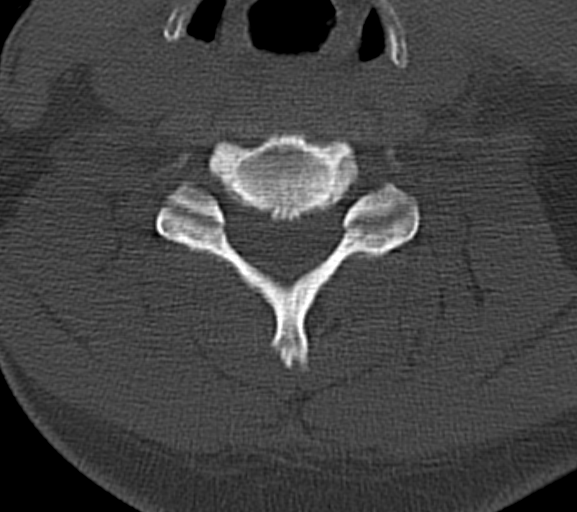
[im 37/89  bone]
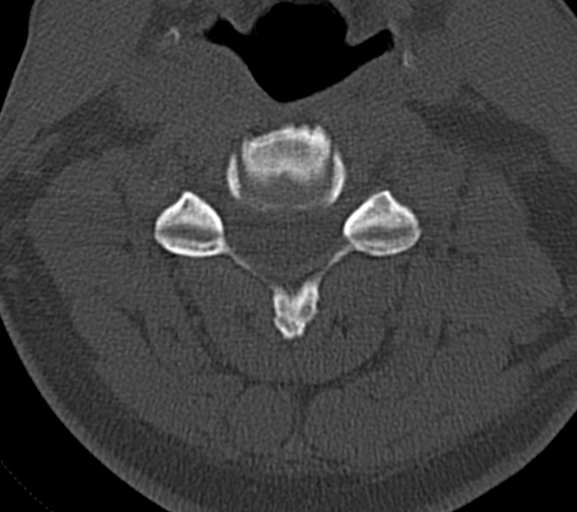
[im 52/89  bone]
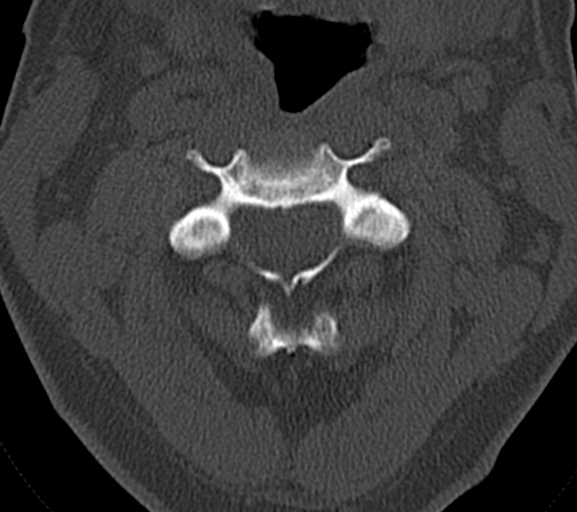
[im 59/89  bone]
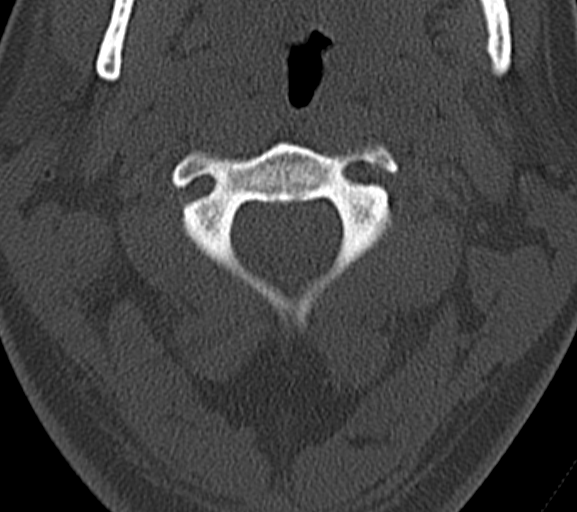

[15 of 47 positions shown; findings below may reference images not displayed]

FINDINGS: CT HEAD FINDINGS

Right supraorbital soft tissue swelling is evident. There is no
underlying fracture. The paranasal sinuses and mastoid air cells are
clear. A remote left medial orbital blowout fracture is evident. The
osseous skull is otherwise intact.

No acute infarct, hemorrhage, or mass lesion is present. The
ventricles are of normal size. No significant extraaxial fluid
collection is present.

CT CERVICAL SPINE FINDINGS

Cervical spine is imaged from the skullbase through C7-T1. Vertebral
body heights and alignment are normal. There is chronic loss of
height and uncovertebral spurring at C5-6. Endplate changes are
noted posteriorly at C3-4 with focal ossification posterior
longitudinal ligament. No acute fracture or traumatic subluxation is
evident. Soft tissues of the neck are unremarkable.
IMPRESSION: 1. Right supraorbital soft tissue swelling without an underlying
fracture.
2. Normal CT appearance of the brain.
3. Degenerative changes of the cervical spine without evidence for
acute fracture or traumatic subluxation.

## 2015-05-30 ENCOUNTER — Other Ambulatory Visit: Payer: Self-pay | Admitting: *Deleted

## 2015-05-30 ENCOUNTER — Telehealth: Payer: Self-pay | Admitting: Orthopedic Surgery

## 2015-05-30 MED ORDER — HYDROCODONE-ACETAMINOPHEN 5-325 MG PO TABS: 1.0000 | ORAL_TABLET | Freq: Four times a day (QID) | ORAL | Status: DC | PRN

## 2015-05-30 NOTE — Telephone Encounter (Signed)
Prescription available, patient aware  

## 2015-05-30 NOTE — Telephone Encounter (Signed)
Patient requests refill on Hydrocodone 5-325 mgs.  Qty 120

## 2015-05-30 NOTE — Telephone Encounter (Signed)
WILL THIS PATIENT NEED TO BE WEANED?

## 2015-05-30 NOTE — Telephone Encounter (Signed)
Refill 5 mg 1 q 6 #120

## 2015-06-20 ENCOUNTER — Ambulatory Visit (INDEPENDENT_AMBULATORY_CARE_PROVIDER_SITE_OTHER): Payer: BLUE CROSS/BLUE SHIELD | Admitting: *Deleted

## 2015-06-20 DIAGNOSIS — I480 Paroxysmal atrial fibrillation: Secondary | ICD-10-CM | POA: Diagnosis not present

## 2015-06-20 DIAGNOSIS — I4891 Unspecified atrial fibrillation: Secondary | ICD-10-CM | POA: Diagnosis not present

## 2015-06-20 DIAGNOSIS — Z5181 Encounter for therapeutic drug level monitoring: Secondary | ICD-10-CM

## 2015-06-20 LAB — POCT INR: INR: 2.6

## 2015-06-27 ENCOUNTER — Other Ambulatory Visit: Payer: Self-pay | Admitting: Cardiology

## 2015-07-04 ENCOUNTER — Other Ambulatory Visit: Payer: Self-pay | Admitting: *Deleted

## 2015-07-04 ENCOUNTER — Telehealth: Payer: Self-pay | Admitting: Orthopedic Surgery

## 2015-07-04 MED ORDER — HYDROCODONE-ACETAMINOPHEN 5-325 MG PO TABS: 1.0000 | ORAL_TABLET | Freq: Four times a day (QID) | ORAL | Status: DC | PRN

## 2015-07-04 NOTE — Telephone Encounter (Signed)
Patient requesting refill of Hydrocodone 5/325mg  Qty 120 Tablets  Take 1 by mouth every 6 hours as needed for moderate pain

## 2015-07-09 ENCOUNTER — Telehealth: Payer: Self-pay | Admitting: Orthopaedic Surgery

## 2015-07-11 ENCOUNTER — Other Ambulatory Visit: Payer: Self-pay | Admitting: Cardiology

## 2015-07-11 NOTE — Telephone Encounter (Signed)
Rx done. 

## 2015-08-02 ENCOUNTER — Telehealth: Payer: Self-pay | Admitting: Orthopaedic Surgery

## 2015-08-02 ENCOUNTER — Other Ambulatory Visit: Payer: Self-pay | Admitting: *Deleted

## 2015-08-02 MED ORDER — HYDROCODONE-ACETAMINOPHEN 5-325 MG PO TABS: 1.0000 | ORAL_TABLET | Freq: Four times a day (QID) | ORAL | Status: DC | PRN

## 2015-08-02 NOTE — Telephone Encounter (Signed)
Rx done. 

## 2015-08-02 NOTE — Telephone Encounter (Signed)
Patient called and requested a refill on Norco 5-325 mgs. Qty 120   This was last filled on 07-04-15       Sig: Take 1 tablet by mouth every 6 (six) hours as needed for moderate pain.        He states he is off today and would like to be able to pick up his prescription today.  He has to work 12 hour shifts for the next 3-4 days.

## 2015-08-02 NOTE — Telephone Encounter (Signed)
Last month should have been third wean script, do you want to refill??

## 2015-08-02 NOTE — Telephone Encounter (Signed)
refill 

## 2015-08-03 ENCOUNTER — Other Ambulatory Visit: Payer: Self-pay | Admitting: *Deleted

## 2015-08-03 MED ORDER — HYDROCODONE-ACETAMINOPHEN 5-325 MG PO TABS: 1.0000 | ORAL_TABLET | Freq: Four times a day (QID) | ORAL | Status: DC | PRN

## 2015-08-08 ENCOUNTER — Ambulatory Visit (INDEPENDENT_AMBULATORY_CARE_PROVIDER_SITE_OTHER): Payer: BLUE CROSS/BLUE SHIELD | Admitting: *Deleted

## 2015-08-08 DIAGNOSIS — Z5181 Encounter for therapeutic drug level monitoring: Secondary | ICD-10-CM

## 2015-08-08 DIAGNOSIS — I4891 Unspecified atrial fibrillation: Secondary | ICD-10-CM

## 2015-08-08 LAB — POCT INR: INR: 3.4

## 2015-08-31 ENCOUNTER — Telehealth: Payer: Self-pay | Admitting: Orthopedic Surgery

## 2015-08-31 ENCOUNTER — Other Ambulatory Visit: Payer: Self-pay | Admitting: *Deleted

## 2015-08-31 MED ORDER — HYDROCODONE-ACETAMINOPHEN 5-325 MG PO TABS: 1.0000 | ORAL_TABLET | Freq: Four times a day (QID) | ORAL | Status: DC | PRN

## 2015-08-31 NOTE — Telephone Encounter (Signed)
Prescription available 

## 2015-08-31 NOTE — Telephone Encounter (Signed)
Hydrocodone-Acetaminophen 5/325mg  Qty 120 Tablets  ° °Take 1 tablet by mouth every 6(six) hours as needed for moderate pain. ° ° ° °

## 2015-08-31 NOTE — Telephone Encounter (Signed)
YES

## 2015-08-31 NOTE — Telephone Encounter (Signed)
Will you be continuing to refill this? Patient was given wean letter in January

## 2015-09-08 ENCOUNTER — Ambulatory Visit: Payer: BLUE CROSS/BLUE SHIELD | Admitting: Cardiology

## 2015-09-15 ENCOUNTER — Telehealth: Payer: Self-pay | Admitting: Cardiology

## 2015-09-15 NOTE — Telephone Encounter (Signed)
Lab slips faxed to [facility] with pt that he just needed to show his ID upon arrival

## 2015-09-15 NOTE — Telephone Encounter (Signed)
Pt has lost the paper to have his lab work done prior to his apt w/ Diona Browner on Monday 09/15/15

## 2015-09-19 ENCOUNTER — Ambulatory Visit: Payer: BLUE CROSS/BLUE SHIELD | Admitting: Cardiology

## 2015-09-28 ENCOUNTER — Other Ambulatory Visit: Payer: Self-pay

## 2015-09-28 MED ORDER — POTASSIUM CHLORIDE ER 10 MEQ PO TBCR: 10.0000 meq | EXTENDED_RELEASE_TABLET | Freq: Every morning | ORAL | Status: DC

## 2015-09-28 NOTE — Telephone Encounter (Signed)
Refill complete 

## 2015-10-03 ENCOUNTER — Other Ambulatory Visit: Payer: Self-pay | Admitting: *Deleted

## 2015-10-03 ENCOUNTER — Telehealth: Payer: Self-pay | Admitting: Orthopedic Surgery

## 2015-10-03 MED ORDER — HYDROCODONE-ACETAMINOPHEN 5-325 MG PO TABS: 1.0000 | ORAL_TABLET | Freq: Four times a day (QID) | ORAL | Status: DC | PRN

## 2015-10-03 NOTE — Telephone Encounter (Signed)
Hydrocodone-Acetaminophen 5/325mg  Qty 120 Tablets  ° °Take 1 tablet by mouth every 6(six) hours as needed for moderate pain. ° ° ° °

## 2015-10-03 NOTE — Telephone Encounter (Signed)
Prescription available 

## 2015-10-05 ENCOUNTER — Ambulatory Visit (INDEPENDENT_AMBULATORY_CARE_PROVIDER_SITE_OTHER): Payer: BLUE CROSS/BLUE SHIELD | Admitting: *Deleted

## 2015-10-05 DIAGNOSIS — I4891 Unspecified atrial fibrillation: Secondary | ICD-10-CM

## 2015-10-05 DIAGNOSIS — Z5181 Encounter for therapeutic drug level monitoring: Secondary | ICD-10-CM | POA: Diagnosis not present

## 2015-10-05 LAB — POCT INR: INR: 1.2

## 2015-10-24 ENCOUNTER — Ambulatory Visit (INDEPENDENT_AMBULATORY_CARE_PROVIDER_SITE_OTHER): Payer: BLUE CROSS/BLUE SHIELD | Admitting: *Deleted

## 2015-10-24 DIAGNOSIS — I4891 Unspecified atrial fibrillation: Secondary | ICD-10-CM

## 2015-10-24 DIAGNOSIS — Z5181 Encounter for therapeutic drug level monitoring: Secondary | ICD-10-CM | POA: Diagnosis not present

## 2015-10-24 LAB — POCT INR: INR: 3.2

## 2015-10-31 ENCOUNTER — Other Ambulatory Visit: Payer: Self-pay | Admitting: *Deleted

## 2015-10-31 ENCOUNTER — Other Ambulatory Visit: Payer: Self-pay | Admitting: Radiology

## 2015-10-31 ENCOUNTER — Telehealth: Payer: Self-pay | Admitting: Orthopedic Surgery

## 2015-10-31 ENCOUNTER — Encounter: Payer: Self-pay | Admitting: *Deleted

## 2015-10-31 DIAGNOSIS — G8929 Other chronic pain: Secondary | ICD-10-CM

## 2015-10-31 MED ORDER — HYDROCODONE-ACETAMINOPHEN 5-325 MG PO TABS: 1.0000 | ORAL_TABLET | Freq: Four times a day (QID) | ORAL | 0 refills | Status: DC | PRN

## 2015-10-31 NOTE — Telephone Encounter (Signed)
Come in for urine then 42

## 2015-10-31 NOTE — Telephone Encounter (Signed)
This encounter was created in error - please disregard.

## 2015-10-31 NOTE — Telephone Encounter (Signed)
Patient requests refill on: HYDROcodone-acetaminophen (NORCO/VICODIN) 5-325 MG tablet [[id number]] - quantity 120

## 2015-10-31 NOTE — Telephone Encounter (Signed)
Routing to Dr Harrison for approval 

## 2015-11-02 ENCOUNTER — Other Ambulatory Visit: Payer: Self-pay | Admitting: *Deleted

## 2015-11-02 DIAGNOSIS — R52 Pain, unspecified: Secondary | ICD-10-CM

## 2015-11-10 ENCOUNTER — Other Ambulatory Visit: Payer: Self-pay | Admitting: *Deleted

## 2015-11-10 ENCOUNTER — Other Ambulatory Visit: Payer: Self-pay | Admitting: Cardiology

## 2015-11-10 ENCOUNTER — Telehealth: Payer: Self-pay | Admitting: Orthopedic Surgery

## 2015-11-10 MED ORDER — HYDROCODONE-ACETAMINOPHEN 5-325 MG PO TABS: 1.0000 | ORAL_TABLET | Freq: Three times a day (TID) | ORAL | 0 refills | Status: DC

## 2015-11-10 NOTE — Telephone Encounter (Signed)
Hydrocodone-Acetaminophen 5/325mg   Qty 42 Tablets  Take 1 tablet by mouth every 6 (six) hours as needed for moderate pain.

## 2015-11-10 NOTE — Telephone Encounter (Signed)
Routing to Dr. Harrison to advise 

## 2015-11-10 NOTE — Telephone Encounter (Signed)
Refill 1 q 8 # 30

## 2015-11-14 ENCOUNTER — Encounter: Payer: Self-pay | Admitting: *Deleted

## 2015-11-28 NOTE — Progress Notes (Deleted)
Cardiology Office Note  Date: 11/28/2015   ID: David BihariRussell N [name] [name], DOB 1960/08/10, MRN [id number]  PCP: Milinda AntisURHAM, KAWANTA, MD  Primary Cardiologist: Nona DellSamuel McDowell, MD   No chief complaint on file.   History of Present Illness: David [name] is a 55 y.o. male last seen in January.  He continues on Coumadin with follow-up in the anticoagulation clinic.  Past Medical History:  Diagnosis Date  . Alcohol abuse   . Arthritis    Bilateral knees   . Atrial fibrillation (HCC)   . Chronic kidney disease, stage 2, mildly decreased GFR   . GERD (gastroesophageal reflux disease)   . Gout   . Nonischemic cardiomyopathy (HCC)    LVEF 20-25% improved to 50% on medical therapy  . TIA (transient ischemic attack)     Past Surgical History:  Procedure Laterality Date  . Arthroscopic knee surgery Right 1987  . CARDIOVERSION  10/08/2011   Procedure: CARDIOVERSION;  Surgeon: Jonelle SidleSamuel G McDowell, MD;  Location: [facility];  Service: Cardiovascular;  Laterality: N/A;  Done in PACU  . CARDIOVERSION  01/10/2012   Procedure: CARDIOVERSION;  Surgeon: Marinus MawGregg W Taylor, MD;  [facility];  Service: Cardiovascular;  Laterality: N/A;  . CARDIOVERSION N/A 11/17/2014   Procedure: CARDIOVERSION;  Surgeon: Laqueta LindenSuresh A Koneswaran, MD;  Location: [facility];  Service: Cardiovascular;  Laterality: N/A;  . TEE WITHOUT CARDIOVERSION N/A 11/17/2014   Procedure: TRANSESOPHAGEAL ECHOCARDIOGRAM (TEE);  Surgeon: Laqueta LindenSuresh A Koneswaran, MD;  Location: [facility];  Service: Cardiovascular;  Laterality: N/A;  . TOTAL KNEE ARTHROPLASTY Left 01/16/2013   Procedure: TOTAL KNEE ARTHROPLASTY;  Surgeon: Vickki HearingStanley E Harrison, MD;  Location: [facility];  Service: Orthopedics;  Laterality: Left;    Current Outpatient Prescriptions  Medication Sig Dispense Refill  . allopurinol (ZYLOPRIM) 300 MG tablet TAKE ONE TABLET BY MOUTH DAILY. 30 tablet 5  . amiodarone (PACERONE) 100 MG tablet Take 1 tablet (100 mg total) by mouth daily. 90 tablet  3  . amiodarone (PACERONE) 200 MG tablet TAKE ONE HALF TABLET BY MOUTH ONCE DAILY. 15 tablet 0  . carvedilol (COREG) 6.25 MG tablet TAKE 1 TABLET BY MOUTH TWICE DAILY WITH MEALS. 60 tablet 0  . furosemide (LASIX) 40 MG tablet Take 0.5 tablets (20 mg total) by mouth daily. 30 tablet 3  . HYDROcodone-acetaminophen (NORCO/VICODIN) 5-325 MG tablet Take 1 tablet by mouth every 8 (eight) hours. 30 tablet 0  . KLOR-CON M10 10 MEQ tablet TAKE ONE TABLET BY MOUTH ONCE DAILY IN THE MORNING 30 tablet 6  . lisinopril (PRINIVIL,ZESTRIL) 5 MG tablet TAKE ONE TABLET BY MOUTH DAILY. 30 tablet 6  . methocarbamol (ROBAXIN) 500 MG tablet TAKE 1 TABLET BY MOUTH EVERY 8 HOURS AS NEEDED FOR MUSCLE SPASM. 60 tablet 0  . potassium chloride (K-DUR) 10 MEQ tablet Take 1 tablet (10 mEq total) by mouth every morning. 30 tablet 6  . warfarin (COUMADIN) 5 MG tablet TAKE 1-1.5 TABLETS BY MOUTH DAILY AS DIRECTED. 45 tablet 3   No current facility-administered medications for this visit.    Allergies:  Azithromycin   Social History: The patient  reports that he has never smoked. He has quit using smokeless tobacco. His smokeless tobacco use included Snuff. He reports that he drinks about 0.6 oz of alcohol per week . He reports that he does not use drugs.   Family History: The patient's family history includes Hypertension in his brother, mother, and sister.   ROS:  Please see the history of present  illness. Otherwise, complete review of systems is positive for {NONE DEFAULTED:18576::"none"}.  All other systems are reviewed and negative.   Physical Exam: VS:  There were no vitals taken for this visit., BMI There is no height or weight on file to calculate BMI.  Wt Readings from Last 3 Encounters:  04/14/15 224 lb (101.6 kg)  02/24/15 231 lb (104.8 kg)  12/15/14 230 lb 12.8 oz (104.7 kg)    General: Patient appears comfortable at rest. HEENT: Conjunctiva and lids normal, oropharynx clear with moist mucosa. Neck: Supple,  no elevated JVP or carotid bruits, no thyromegaly. Lungs: Clear to auscultation, nonlabored breathing at rest. Cardiac: Regular rate and rhythm, no S3 or significant systolic murmur, no pericardial rub. Abdomen: Soft, nontender, no hepatomegaly, bowel sounds present, no guarding or rebound. Extremities: No pitting edema, distal pulses 2+.  ECG: I personally reviewed the tracing from 11/17/2014 which showed sinus bradycardia with QTc 493 ms.  Recent Labwork:  11/10/2014: ALT 28; AST 23; TSH 3.035 11/17/2014: BUN 11; Creatinine, Ser 1.37*; Hemoglobin 10.9*; Platelets 287; Potassium 4.1; Sodium 140   Other Studies Reviewed Today:  TEE 11/17/2014: Study Conclusions  - Left ventricle: Systolic function was normal. The estimated   ejection fraction was in the range of 60% to 65%. - Aortic valve: Trileaflet; mildly thickened leaflets. There was   moderate regurgitation. - Mitral valve: There was mild regurgitation. Three separate   trivial jets were noted, cumulatively mild in severity. - Left atrium: The atrium was dilated. No evidence of thrombus in   the atrial cavity or appendage. Pulsed Doppler velocities within   the appendage were normal.  Impressions:  - Successful cardioversion. No cardiac source of emboli was   indentified.  Assessment and Plan:   Current medicines were reviewed with the patient today.  No orders of the defined types were placed in this encounter.   Disposition:  Signed, Jonelle Sidle, MD, [facility] 11/28/2015 1:46 PM    [facility] at [facility] [address] [place], [place] [address] Phone: [phone]; Fax: [phone]

## 2015-11-29 ENCOUNTER — Ambulatory Visit: Payer: BLUE CROSS/BLUE SHIELD | Admitting: Cardiology

## 2015-11-29 ENCOUNTER — Encounter: Payer: Self-pay | Admitting: Cardiology

## 2015-12-24 ENCOUNTER — Other Ambulatory Visit: Payer: Self-pay | Admitting: Cardiology

## 2015-12-28 ENCOUNTER — Encounter: Payer: Self-pay | Admitting: *Deleted

## 2016-02-06 ENCOUNTER — Ambulatory Visit (INDEPENDENT_AMBULATORY_CARE_PROVIDER_SITE_OTHER): Payer: 59 | Admitting: *Deleted

## 2016-02-06 ENCOUNTER — Ambulatory Visit (INDEPENDENT_AMBULATORY_CARE_PROVIDER_SITE_OTHER): Payer: 59 | Admitting: Cardiology

## 2016-02-06 ENCOUNTER — Encounter: Payer: Self-pay | Admitting: Cardiology

## 2016-02-06 VITALS — BP 142/82 | HR 59 | Ht 71.0 in | Wt 215.0 lb

## 2016-02-06 DIAGNOSIS — I428 Other cardiomyopathies: Secondary | ICD-10-CM

## 2016-02-06 DIAGNOSIS — I481 Persistent atrial fibrillation: Secondary | ICD-10-CM | POA: Diagnosis not present

## 2016-02-06 DIAGNOSIS — I4891 Unspecified atrial fibrillation: Secondary | ICD-10-CM

## 2016-02-06 DIAGNOSIS — Z5181 Encounter for therapeutic drug level monitoring: Secondary | ICD-10-CM

## 2016-02-06 DIAGNOSIS — Z8673 Personal history of transient ischemic attack (TIA), and cerebral infarction without residual deficits: Secondary | ICD-10-CM | POA: Diagnosis not present

## 2016-02-06 DIAGNOSIS — Z87898 Personal history of other specified conditions: Secondary | ICD-10-CM | POA: Diagnosis not present

## 2016-02-06 DIAGNOSIS — F1011 Alcohol abuse, in remission: Secondary | ICD-10-CM

## 2016-02-06 DIAGNOSIS — I4819 Other persistent atrial fibrillation: Secondary | ICD-10-CM

## 2016-02-06 LAB — POCT INR: INR: 1.1

## 2016-02-06 NOTE — Progress Notes (Signed)
Cardiology Office Note  Date: 02/06/2016   ID: David [name], DOB Jan 08, 1961, MRN [id number]  PCP: Milinda Antis, MD  Primary Cardiologist: Nona Dell, MD   Chief Complaint  Patient presents with  . Atrial Fibrillation  . History of cardiomyopathy    History of Present Illness: David [name] is a 55 y.o. male last seen in January. He presents overdue for a follow-up visit. He does not report any palpitations at this time, no chest pain or syncope. Unfortunately, he ran out of his medications about a week ago, has not been able to fill them yet.  INR was checked today at 1.1. He will be reestablished in the Coumadin clinic after he starts back on Coumadin. We did review his regular medications and will plan to stop amiodarone at this point. He has not maintained sinus rhythm adequately and at this point does not feel palpitations. ECG today confirms atrial fibrillation with overall controlled rate.  He is overdue for follow-up lab work and follow-up with Dr. Jeanice Lim. I encouraged him to make a visit for a physical.  Past Medical History:  Diagnosis Date  . Alcohol abuse   . Arthritis    Bilateral knees   . Atrial fibrillation (HCC)   . Chronic kidney disease, stage 2, mildly decreased GFR   . GERD (gastroesophageal reflux disease)   . Gout   . Nonischemic cardiomyopathy (HCC)    LVEF 20-25% improved to 50% on medical therapy  . TIA (transient ischemic attack)     Past Surgical History:  Procedure Laterality Date  . Arthroscopic knee surgery Right 1987  . CARDIOVERSION  10/08/2011   Procedure: CARDIOVERSION;  Surgeon: Jonelle Sidle, MD;  Location: [facility];  Service: Cardiovascular;  Laterality: N/A;  Done in PACU  . CARDIOVERSION  01/10/2012   Procedure: CARDIOVERSION;  Surgeon: Marinus Maw, MD;  Location: [facility] ENDOSCOPY;  Service: Cardiovascular;  Laterality: N/A;  . CARDIOVERSION N/A 11/17/2014   Procedure: CARDIOVERSION;  Surgeon: Laqueta Linden, MD;  Location: [facility];  Service: Cardiovascular;  Laterality: N/A;  . TEE WITHOUT CARDIOVERSION N/A 11/17/2014   Procedure: TRANSESOPHAGEAL ECHOCARDIOGRAM (TEE);  Surgeon: Laqueta Linden, MD;  Location: [facility];  Service: Cardiovascular;  Laterality: N/A;  . TOTAL KNEE ARTHROPLASTY Left 01/16/2013   Procedure: TOTAL KNEE ARTHROPLASTY;  Surgeon: Vickki Hearing, MD;  Location: [facility];  Service: Orthopedics;  Laterality: Left;    Current Outpatient Prescriptions  Medication Sig Dispense Refill  . allopurinol (ZYLOPRIM) 300 MG tablet TAKE ONE TABLET BY MOUTH DAILY. 30 tablet 5  . carvedilol (COREG) 6.25 MG tablet TAKE 1 TABLET BY MOUTH TWICE DAILY WITH MEALS. 60 tablet 1  . furosemide (LASIX) 40 MG tablet Take 0.5 tablets (20 mg total) by mouth daily. 30 tablet 3  . KLOR-CON M10 10 MEQ tablet TAKE ONE TABLET BY MOUTH ONCE DAILY IN THE MORNING 30 tablet 6  . lisinopril (PRINIVIL,ZESTRIL) 5 MG tablet TAKE ONE TABLET BY MOUTH DAILY. 30 tablet 6  . methocarbamol (ROBAXIN) 500 MG tablet TAKE 1 TABLET BY MOUTH EVERY 8 HOURS AS NEEDED FOR MUSCLE SPASM. 60 tablet 0  . warfarin (COUMADIN) 5 MG tablet TAKE 1-1.5 TABLETS BY MOUTH DAILY AS DIRECTED. 45 tablet 3   No current facility-administered medications for this visit.    Allergies:  Azithromycin   Social History: The patient  reports that he has never smoked. He has quit using smokeless tobacco. His smokeless tobacco use included Snuff. He  reports that he drinks about 0.6 oz of alcohol per week . He reports that he does not use drugs.   ROS:  Please see the history of present illness. Otherwise, complete review of systems is positive for none.  All other systems are reviewed and negative.   Physical Exam: VS:  BP (!) 142/82   Pulse (!) 59   Ht 5\' 11"  (1.803 m)   Wt 215 lb (97.5 kg)   SpO2 95%   BMI 29.99 kg/m , BMI Body mass index is 29.99 kg/m.  Wt Readings from Last 3 Encounters:  02/06/16 215 lb (97.5 kg)  04/14/15 224  lb (101.6 kg)  02/24/15 231 lb (104.8 kg)    General: Patient appears comfortable at rest. HEENT: Conjunctiva and lids normal, oropharynx clear with moist mucosa. Neck: Supple, no elevated JVP or carotid bruits, no thyromegaly. Lungs: Clear to auscultation, nonlabored breathing at rest. Cardiac: Irregularly irregular, no S3 or significant systolic murmur, no pericardial rub. Abdomen: Soft, nontender, no hepatomegaly, bowel sounds present, no guarding or rebound. Extremities: No pitting edema, distal pulses 2+. Skin: Warm and dry. Musculoskeletal: No kyphosis. Neuropsychiatric: Alert and oriented 3, affect appropriate.  ECG: I personally reviewed the tracing from 11/17/2014 which showed sinus bradycardia with prolonged QTc.  Other Studies Reviewed Today:  TEE 11/17/2014: Study Conclusions  - Left ventricle: Systolic function was normal. The estimated   ejection fraction was in the range of 60% to 65%. - Aortic valve: Trileaflet; mildly thickened leaflets. There was   moderate regurgitation. - Mitral valve: There was mild regurgitation. Three separate   trivial jets were noted, cumulatively mild in severity. - Left atrium: The atrium was dilated. No evidence of thrombus in   the atrial cavity or appendage. Pulsed Doppler velocities within   the appendage were normal.  Impressions:  - Successful cardioversion. No cardiac source of emboli was   indentified.  Assessment and Plan:  1. Recurrent, persistent atrial fibrillation. He is asymptomatic in terms of palpitations. Plan at this point is to stop amiodarone and continue strategy of heart rate control and anticoagulation. He will resume prior dose of Coreg and also maintain Coumadin follow-up in the anticoagulation clinic.  2. History of nonischemic cardiopathy with normalization of LVEF. We will obtain a follow-up echocardiogram to ensure stability. He is to remain on Coreg and lisinopril.  3. History of alcohol abuse, he  reports cessation.  4. Prior history of TIA, recommending long-term anticoagulation. Compliance has been difficult.  Current medicines were reviewed with the patient today.   Orders Placed This Encounter  Procedures  . EKG 12-Lead  . ECHOCARDIOGRAM COMPLETE    Disposition: Follow-up with in 3 months.  Signed, Jonelle Sidle, MD, [facility] 02/06/2016 4:45 PM    [facility] at [facility] [address], [place], [place] [address] Phone: [phone]; Fax: [phone]

## 2016-02-06 NOTE — Patient Instructions (Signed)
Your physician recommends that you schedule a follow-up appointment in: 3 months Dr Diona Browner   STOP Amiodarone    Your physician has requested that you have an echocardiogram. Echocardiography is a painless test that uses sound waves to create images of your heart. It provides your doctor with information about the size and shape of your heart and how well your heart's chambers and valves are working. This procedure takes approximately one hour. There are no restrictions for this procedure.      Thank you for choosing [facility] !

## 2016-02-21 ENCOUNTER — Encounter: Payer: Self-pay | Admitting: Cardiology

## 2016-02-21 ENCOUNTER — Ambulatory Visit (HOSPITAL_COMMUNITY)
Admission: RE | Admit: 2016-02-21 | Discharge: 2016-02-21 | Disposition: A | Discharge status: Home / Self Care | Payer: 59 | Source: Ambulatory Visit | Attending: Cardiology | Admitting: Cardiology

## 2016-02-21 DIAGNOSIS — E785 Hyperlipidemia, unspecified: Secondary | ICD-10-CM | POA: Diagnosis not present

## 2016-02-21 DIAGNOSIS — I082 Rheumatic disorders of both aortic and tricuspid valves: Secondary | ICD-10-CM | POA: Insufficient documentation

## 2016-02-21 DIAGNOSIS — I481 Persistent atrial fibrillation: Secondary | ICD-10-CM

## 2016-02-21 DIAGNOSIS — I4819 Other persistent atrial fibrillation: Secondary | ICD-10-CM

## 2016-02-21 NOTE — Progress Notes (Signed)
*  PRELIMINARY RESULTS* Echocardiogram 2D Echocardiogram has been performed.  Stacey Drain 02/21/2016, 3:01 PM

## 2016-02-23 ENCOUNTER — Ambulatory Visit: Payer: 59 | Admitting: Orthopedic Surgery

## 2016-02-27 ENCOUNTER — Encounter: Payer: Self-pay | Admitting: Orthopedic Surgery

## 2016-02-27 ENCOUNTER — Ambulatory Visit (INDEPENDENT_AMBULATORY_CARE_PROVIDER_SITE_OTHER): Payer: 59

## 2016-02-27 ENCOUNTER — Ambulatory Visit (INDEPENDENT_AMBULATORY_CARE_PROVIDER_SITE_OTHER): Payer: 59 | Admitting: *Deleted

## 2016-02-27 ENCOUNTER — Ambulatory Visit (INDEPENDENT_AMBULATORY_CARE_PROVIDER_SITE_OTHER): Payer: 59 | Admitting: Orthopedic Surgery

## 2016-02-27 DIAGNOSIS — M1710 Unilateral primary osteoarthritis, unspecified knee: Secondary | ICD-10-CM

## 2016-02-27 DIAGNOSIS — Z5181 Encounter for therapeutic drug level monitoring: Secondary | ICD-10-CM | POA: Diagnosis not present

## 2016-02-27 DIAGNOSIS — I4891 Unspecified atrial fibrillation: Secondary | ICD-10-CM

## 2016-02-27 LAB — POCT INR: INR: 1.2

## 2016-02-27 NOTE — Progress Notes (Signed)
ANNUAL FOLLOW UP FOR  left TKA  Chief Complaint  Patient presents with  . Follow-up    YEARLY LEFT TKA XRAY, DOS 01/16/13     Hpi: The patient is here for the left year follow-up x-ray   System review right knee pain    Examination of the left KNEE   Inspection shows : incision healed nicely without erythema, no tenderness no swelling  Range of motion total range of motion is 115  Stability the knee is stable anterior to posterior as well as medial to lateral  Strength quadriceps strength is normal  Skin no erythema around the skin incision  Cardiovascular mild distal peripheral edema    Medical decision-making section  X-rays ordered with the following personal interpretation  Radiology report  3 views left knee  AP lateral and patellar sunrise views of the knee  FINDINGS: A total knee prosthesis is noted. It is in anatomic alignment. There is no evidence of loosening.  Impression : normal TKA  xray   Diagnosis  Plan follow-up 1 year repeat x-rays   Return January FOR RT KNEE XRAYS

## 2016-03-12 ENCOUNTER — Encounter: Payer: Self-pay | Admitting: *Deleted

## 2016-05-01 ENCOUNTER — Ambulatory Visit: Payer: 59 | Admitting: Orthopedic Surgery

## 2016-05-07 ENCOUNTER — Encounter: Payer: Self-pay | Admitting: Orthopedic Surgery

## 2016-05-07 ENCOUNTER — Ambulatory Visit (INDEPENDENT_AMBULATORY_CARE_PROVIDER_SITE_OTHER): Payer: 59 | Admitting: Orthopedic Surgery

## 2016-05-07 ENCOUNTER — Ambulatory Visit (INDEPENDENT_AMBULATORY_CARE_PROVIDER_SITE_OTHER): Payer: 59

## 2016-05-07 DIAGNOSIS — M1710 Unilateral primary osteoarthritis, unspecified knee: Secondary | ICD-10-CM

## 2016-05-07 NOTE — Progress Notes (Deleted)
Cardiology Office Note  Date: 05/07/2016   ID: David [name], DOB [birth year], MRN [id number]  PCP: Milinda Antis, MD  Primary Cardiologist: Nona Dell, MD   No chief complaint on file.   History of Present Illness: David [name] is a 56 y.o. male last seen in October 2017.  At the last visit he was reestablished in the anticoagulation clinic, however has had continued problems with noncompliance.  Past Medical History:  Diagnosis Date  . Alcohol abuse   . Arthritis    Bilateral knees   . Atrial fibrillation (HCC)   . Chronic kidney disease, stage 2, mildly decreased GFR   . GERD (gastroesophageal reflux disease)   . Gout   . Nonischemic cardiomyopathy (HCC)    LVEF 20-25% improved to 50% on medical therapy  . TIA (transient ischemic attack)     Past Surgical History:  Procedure Laterality Date  . Arthroscopic knee surgery Right 1987  . CARDIOVERSION  10/08/2011   Procedure: CARDIOVERSION;  Surgeon: Jonelle Sidle, MD;  Location: [facility];  Service: Cardiovascular;  Laterality: N/A;  Done in PACU  . CARDIOVERSION  01/10/2012   Procedure: CARDIOVERSION;  Surgeon: Marinus Maw, MD;  Location: [facility] ENDOSCOPY;  Service: Cardiovascular;  Laterality: N/A;  . CARDIOVERSION N/A 11/17/2014   Procedure: CARDIOVERSION;  Surgeon: Laqueta Linden, MD;  Location: [facility];  Service: Cardiovascular;  Laterality: N/A;  . TEE WITHOUT CARDIOVERSION N/A 11/17/2014   Procedure: TRANSESOPHAGEAL ECHOCARDIOGRAM (TEE);  Surgeon: Laqueta Linden, MD;  Location: [facility];  Service: Cardiovascular;  Laterality: N/A;  . TOTAL KNEE ARTHROPLASTY Left 01/16/2013   Procedure: TOTAL KNEE ARTHROPLASTY;  Surgeon: Vickki Hearing, MD;  Location: [facility];  Service: Orthopedics;  Laterality: Left;    Current Outpatient Prescriptions  Medication Sig Dispense Refill  . allopurinol (ZYLOPRIM) 300 MG tablet TAKE ONE TABLET BY MOUTH DAILY. 30 tablet 5  . carvedilol (COREG) 6.25 MG  tablet TAKE 1 TABLET BY MOUTH TWICE DAILY WITH MEALS. 60 tablet 1  . furosemide (LASIX) 40 MG tablet Take 0.5 tablets (20 mg total) by mouth daily. 30 tablet 3  . KLOR-CON M10 10 MEQ tablet TAKE ONE TABLET BY MOUTH ONCE DAILY IN THE MORNING 30 tablet 6  . lisinopril (PRINIVIL,ZESTRIL) 5 MG tablet TAKE ONE TABLET BY MOUTH DAILY. 30 tablet 6  . methocarbamol (ROBAXIN) 500 MG tablet TAKE 1 TABLET BY MOUTH EVERY 8 HOURS AS NEEDED FOR MUSCLE SPASM. 60 tablet 0  . warfarin (COUMADIN) 5 MG tablet TAKE 1-1.5 TABLETS BY MOUTH DAILY AS DIRECTED. 45 tablet 3   No current facility-administered medications for this visit.    Allergies:  Azithromycin   Social History: The patient  reports that he has never smoked. He has quit using smokeless tobacco. His smokeless tobacco use included Snuff. He reports that he drinks about 0.6 oz of alcohol per week . He reports that he does not use drugs.   Family History: The patient's family history includes Hypertension in his brother, mother, and sister.   ROS:  Please see the history of present illness. Otherwise, complete review of systems is positive for {NONE DEFAULTED:18576::"none"}.  All other systems are reviewed and negative.   Physical Exam: VS:  There were no vitals taken for this visit., BMI There is no height or weight on file to calculate BMI.  Wt Readings from Last 3 Encounters:  02/06/16 215 lb (97.5 kg)  04/14/15 224 lb (101.6 kg)  02/24/15 231 lb (  104.8 kg)    General: Patient appears comfortable at rest. HEENT: Conjunctiva and lids normal, oropharynx clear with moist mucosa. Neck: Supple, no elevated JVP or carotid bruits, no thyromegaly. Lungs: Clear to auscultation, nonlabored breathing at rest. Cardiac: Irregularly irregular, no S3 or significant systolic murmur, no pericardial rub. Abdomen: Soft, nontender, no hepatomegaly, bowel sounds present, no guarding or rebound. Extremities: No pitting edema, distal pulses 2+. Skin: Warm and  dry. Musculoskeletal: No kyphosis. Neuropsychiatric: Alert and oriented 3, affect appropriate.  ECG: I personally reviewed the tracing from 02/06/2016 which showed rate-controlled atrial fibrillation.  Recent Labwork:   Other Studies Reviewed Today:  Echocardiogram 02/21/2016: Study Conclusions  - Left ventricle: Diffuse hypokinesis worse in the septum and apex.   The cavity size was moderately dilated. Wall thickness was   increased in a pattern of mild LVH. Systolic function was mildly   to moderately reduced. The estimated ejection fraction was in the   range of 40% to 45%. The study is not technically sufficient to   allow evaluation of LV diastolic function. - Aortic valve: There was mild regurgitation. - Atrial septum: No defect or patent foramen ovale was identified. - Impressions: EF has decreased since echo done 2016 but better   than 2013 when it was 20-25%  Impressions:  - EF has decreased since echo done 2016 but better than 2013 when   it was 20-25%  Assessment and Plan:   Current medicines were reviewed with the patient today.  No orders of the defined types were placed in this encounter.   Disposition:  Signed, Jonelle Sidle, MD, [facility] 05/07/2016 4:47 PM    [facility] at [facility] [address] [place], [place] [address] Phone: [phone]; Fax: [phone]

## 2016-05-07 NOTE — Progress Notes (Signed)
Chief complaint right knee pain  56 year old male did well with a left total knee presents with chronic right knee pain for 1 year follow-up x-ray  Complaining of medial joint line pain catching locking or giving way of the knee with dull aching pain  Review of systems is otherwise been in good condition with no shortness of breath or chest pain   His knee is in varus he has a small effusion has tenderness on the medial joint line he can flex to 120 his extension is -10 is knee is stable strength normal skin is intact pulses are good sensation is normal    x-rays show severe arthritis  When he cannot stand the pain anymore he should have a right total knee replacement he will discuss this with his wife

## 2016-05-07 NOTE — Patient Instructions (Signed)
Discussed with wife the possibility of right total knee replacement

## 2016-05-08 ENCOUNTER — Ambulatory Visit: Payer: BLUE CROSS/BLUE SHIELD | Admitting: Cardiology

## 2016-05-21 ENCOUNTER — Ambulatory Visit (INDEPENDENT_AMBULATORY_CARE_PROVIDER_SITE_OTHER): Payer: 59 | Admitting: Cardiology

## 2016-05-21 ENCOUNTER — Encounter: Payer: Self-pay | Admitting: Cardiology

## 2016-05-21 VITALS — BP 136/82 | HR 70 | Ht 71.0 in | Wt 197.0 lb

## 2016-05-21 DIAGNOSIS — I481 Persistent atrial fibrillation: Secondary | ICD-10-CM

## 2016-05-21 DIAGNOSIS — I4819 Other persistent atrial fibrillation: Secondary | ICD-10-CM

## 2016-05-21 DIAGNOSIS — I429 Cardiomyopathy, unspecified: Secondary | ICD-10-CM | POA: Diagnosis not present

## 2016-05-21 MED ORDER — WARFARIN SODIUM 5 MG PO TABS: ORAL_TABLET | ORAL | 3 refills | Status: DC

## 2016-05-21 MED ORDER — FUROSEMIDE 40 MG PO TABS: 20.0000 mg | ORAL_TABLET | Freq: Every day | ORAL | 6 refills | Status: DC

## 2016-05-21 MED ORDER — POTASSIUM CHLORIDE CRYS ER 10 MEQ PO TBCR: EXTENDED_RELEASE_TABLET | ORAL | 6 refills | Status: DC

## 2016-05-21 MED ORDER — LISINOPRIL 5 MG PO TABS: 5.0000 mg | ORAL_TABLET | Freq: Every day | ORAL | 6 refills | Status: DC

## 2016-05-21 MED ORDER — CARVEDILOL 6.25 MG PO TABS: 6.2500 mg | ORAL_TABLET | Freq: Two times a day (BID) | ORAL | 6 refills | Status: DC

## 2016-05-21 NOTE — Patient Instructions (Signed)
Your physician recommends that you schedule a follow-up appointment in: 3 months Dr Diona Browner    Your physician recommends that you continue on your current medications as directed. Please refer to the Current Medication list given to you today.     I refilled your medications       Thank you for choosing [facility] !

## 2016-05-21 NOTE — Progress Notes (Signed)
Cardiology Office Note  Date: 05/21/2016   ID: David [name], DOB [birth year] [name], MRN [id number]  PCP: Milinda Antis, MD  Primary Cardiologist: Nona Dell, MD   Chief Complaint  Patient presents with  . Atrial Fibrillation  . Cardiomyopathy    History of Present Illness: David [name] is a 56 y.o. male last seen in October 2017. He presents for a follow-up visit. Unfortunately, states that he has not been able to afford his medications and has been off for about 3 weeks. He plans to get his refills this week. He does not report any chest pain or palpitations. 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  He has been off and on Coumadin, however not maintained regular follow-up in the anticoagulation clinic. We discussed possibly switching to a DOAC I have asked nursing to look into possibility of an assistance program. I reinforced with him the importance of being regular with his medications.  Past Medical History:  Diagnosis Date  . Alcohol abuse   . Arthritis    Bilateral knees   . Atrial fibrillation (HCC)   . Chronic kidney disease, stage 2, mildly decreased GFR   . GERD (gastroesophageal reflux disease)   . Gout   . Nonischemic cardiomyopathy (HCC)    LVEF 20-25% improved to 50% on medical therapy  . TIA (transient ischemic attack)     Current Outpatient Prescriptions  Medication Sig Dispense Refill  . allopurinol (ZYLOPRIM) 300 MG tablet TAKE ONE TABLET BY MOUTH DAILY. (Patient not taking: Reported on 05/21/2016) 30 tablet 5  . carvedilol (COREG) 6.25 MG tablet Take 1 tablet (6.25 mg total) by mouth 2 (two) times daily with a meal. 60 tablet 6  . furosemide (LASIX) 40 MG tablet Take 0.5 tablets (20 mg total) by mouth daily. 30 tablet 6  . lisinopril (PRINIVIL,ZESTRIL) 5 MG tablet Take 1 tablet (5 mg total) by mouth daily. 30 tablet 6  . methocarbamol (ROBAXIN) 500 MG tablet TAKE 1 TABLET BY MOUTH EVERY 8 HOURS AS NEEDED FOR MUSCLE  SPASM. (Patient not taking: Reported on 05/21/2016) 60 tablet 0  . potassium chloride (KLOR-CON M10) 10 MEQ tablet TAKE ONE TABLET BY MOUTH ONCE DAILY IN THE MORNING 30 tablet 6  . warfarin (COUMADIN) 5 MG tablet TAKE 1-1.5 TABLETS BY MOUTH DAILY AS DIRECTED. 45 tablet 3   No current facility-administered medications for this visit.    Allergies:  Azithromycin   Social History: The patient  reports that he has never smoked. He has quit using smokeless tobacco. His smokeless tobacco use included Snuff. He reports that he drinks about 0.6 oz of alcohol per week . He reports that he does not use drugs.   ROS:  Please see the history of present illness. Otherwise, complete review of systems is positive for none.  All other systems are reviewed and negative.   Physical Exam: VS:  BP 136/82   Pulse 70   Ht 5\' 11"  (1.803 m)   Wt 197 lb (89.4 kg)   SpO2 99%   BMI 27.48 kg/m , BMI Body mass index is 27.48 kg/m.  Wt Readings from Last 3 Encounters:  05/21/16 197 lb (89.4 kg)  02/06/16 215 lb (97.5 kg)  04/14/15 224 lb (101.6 kg)    General: Patient appears comfortable at rest. HEENT: Conjunctiva and lids normal, oropharynx clear with moist mucosa. Neck: Supple, no elevated JVP or carotid bruits, no thyromegaly. Lungs: Clear to auscultation, nonlabored breathing at  rest. Cardiac: Irregularly irregular, no S3 or significant systolic murmur, no pericardial rub. Abdomen: Soft, nontender, no hepatomegaly, bowel sounds present, no guarding or rebound. Extremities: No pitting edema, distal pulses 2+.  ECG: I personally reviewed the tracing from 02/06/2016 which showed atrial fibrillation.  Other Studies Reviewed Today:  Echocardiogram 02/21/2016: Study Conclusions  - Left ventricle: Diffuse hypokinesis worse in the septum and apex.   The cavity size was moderately dilated. Wall thickness was   increased in a pattern of mild LVH. Systolic function was mildly   to moderately reduced. The  estimated ejection fraction was in the   range of 40% to 45%. The study is not technically sufficient to   allow evaluation of LV diastolic function. - Aortic valve: There was mild regurgitation. - Atrial septum: No defect or patent foramen ovale was identified. - Impressions: EF has decreased since echo done 2016 but better   than 2013 when it was 20-25%  Impressions:  - EF has decreased since echo done 2016 but better than 2013 when   it was 20-25%  Assessment and Plan:  1. Persistent atrial fibrillation with CHADSVASC score of 3. I have reinforced with him compliance on medical therapy to address heart rate control and stroke risk. We are refilling his medications as they stand for now, however will look into the possibility of an assistance program for DOAC.  2. Nonischemic cardiomyopathy, last LVEF 40-45%. Likely complicated by medication noncompliance. In the past this had completely normalized when he was being consistent with therapy.  3. History of alcohol abuse. He reports cessation.  Current medicines were reviewed with the patient today.  Disposition: Follow-up in 3 months.  Signed, Jonelle Sidle, MD, [facility] 05/21/2016 4:34 PM    [facility] [address] [place], [place] [address] Phone: [phone]; Fax: [phone]

## 2016-09-07 ENCOUNTER — Ambulatory Visit: Payer: 59 | Admitting: Cardiology

## 2016-09-24 NOTE — Progress Notes (Signed)
Cardiology Office Note  Date: 09/25/2016   ID: David [name], DOB January 07, 1961, MRN [id number]  PCP: David Scarlet, MD  Primary Cardiologist: David [name].   History of Present Illness: David [name] is a 56 y.o. male patient of David [name]  He presents for a follow-up visit.History of persistent afib with non compliance to meds.. He does not report any chest pain or palpitations. 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  Also history of DCM improved when taking meds   Reviewed last echo 02/21/16   Study Conclusions  - Left ventricle: Diffuse hypokinesis worse in the septum and apex.   The cavity size was moderately dilated. Wall thickness was   increased in a pattern of mild LVH. Systolic function was mildly   to moderately reduced. The estimated ejection fraction was in the   range of 40% to 45%. The study is not technically sufficient to   allow evaluation of LV diastolic function. - Aortic valve: There was mild regurgitation. - Atrial septum: No defect or patent foramen ovale was identified. - Impressions: EF has decreased since echo done 2016 but better   than 2013 when it was 20-25%  Impressions:  - EF has decreased since echo done 2016 but better than 2013 when   it was 20-25%  He admits to not taking any meds INR today as expected normal 1.4 He has some mild CHF and rapid afib  In office Concerned about cost but we verified that all his meds are 4$ at [facility].   More dyspnea and LE edema. More rapid palpitations   Past Medical History:  Diagnosis Date  . Alcohol abuse   . Arthritis    Bilateral knees   . Atrial fibrillation (HCC)   . Chronic kidney disease, stage 2, mildly decreased GFR   . GERD (gastroesophageal reflux disease)   . Gout   . Nonischemic cardiomyopathy (HCC)    LVEF 20-25% improved to 50% on medical therapy  . TIA (transient ischemic attack)     Current Outpatient  Prescriptions  Medication Sig Dispense Refill  . allopurinol (ZYLOPRIM) 300 MG tablet TAKE ONE TABLET BY MOUTH DAILY. 30 tablet 5  . carvedilol (COREG) 6.25 MG tablet Take 1 tablet (6.25 mg total) by mouth 2 (two) times daily with a meal. 60 tablet 6  . furosemide (LASIX) 40 MG tablet Take 0.5 tablets (20 mg total) by mouth daily. 30 tablet 6  . lisinopril (PRINIVIL,ZESTRIL) 5 MG tablet Take 1 tablet (5 mg total) by mouth daily. 30 tablet 6  . methocarbamol (ROBAXIN) 500 MG tablet TAKE 1 TABLET BY MOUTH EVERY 8 HOURS AS NEEDED FOR MUSCLE SPASM. 60 tablet 0  . potassium chloride (KLOR-CON M10) 10 MEQ tablet TAKE ONE TABLET BY MOUTH ONCE DAILY IN THE MORNING 30 tablet 6  . warfarin (COUMADIN) 5 MG tablet TAKE 1-1.5 TABLETS BY MOUTH DAILY AS DIRECTED. 45 tablet 3   No current facility-administered medications for this visit.    Allergies:  Azithromycin   Social History: The patient  reports that he has never smoked. He has quit using smokeless tobacco. His smokeless tobacco use included Snuff. He reports that he drinks about 0.6 oz of alcohol per week . He reports that he does not use drugs.   ROS:  Please see the history of present illness. Otherwise, complete review of systems is positive for none.  All other systems are  reviewed and negative.   Physical Exam: VS:  BP 124/82   Pulse (!) 101   Ht 6' (1.829 m)   Wt 88.9 kg (196 lb)   SpO2 94%   BMI 26.58 kg/m , BMI Body mass index is 26.58 kg/m.  Wt Readings from Last 3 Encounters:  09/25/16 88.9 kg (196 lb)  05/21/16 89.4 kg (197 lb)  02/06/16 97.5 kg (215 lb)    Affect appropriate Chronically ill black male  HEENT: normal Neck supple with no adenopathy JVP elevated no bruits no thyromegaly Lungs clear with no wheezing and good diaphragmatic motion Heart:  S1/S2 no murmur, no rub, gallop or click PMI normal Abdomen: benighn, BS positve, no tenderness, no AAA no bruit.  No HSM or HJR Distal pulses intact with no bruits Plus 2  LE  edema Neuro non-focal Skin warm and dry No muscular weakness   ECG: I personally reviewed the tracing from 02/06/2016 which showed atrial fibrillation. 09/25/16  afib poor R wave Progression no acute ST changes   Other Studies Reviewed Today:  Echocardiogram 02/21/2016: Study Conclusions  - Left ventricle: Diffuse hypokinesis worse in the septum and apex.   The cavity size was moderately dilated. Wall thickness was   increased in a pattern of mild LVH. Systolic function was mildly   to moderately reduced. The estimated ejection fraction was in the   range of 40% to 45%. The study is not technically sufficient to   allow evaluation of LV diastolic function. - Aortic valve: There was mild regurgitation. - Atrial septum: No defect or patent foramen ovale was identified. - Impressions: EF has decreased since echo done 2016 but better   than 2013 when it was 20-25%  Impressions:  - EF has decreased since echo done 2016 but better than 2013 when   it was 20-25%  Assessment and Plan:  1. Chronic Afib-  with CHADSVASC score of 3. I have reinforced with him compliance on medical therapy to address heart rate control and stroke risk.  DOAC cost prohibitive restart coumadin f/u clinic in a week  2. Nonischemic cardiomyopathy, last LVEF 40-45% 02/21/16 . Improved when compliant with meds  Re check echo in 2 months once back on meds Increase coreg to 12.5 bid and Zestril  to 10 mg   3. History of alcohol abuse. He reports cessation.  4. HTN: better control when back on meds    But not high today    Current medicines were reviewed with the patient today.  Disposition: Follow-up in   2  months David [name] with BMET and BNP f/u coumadin clinic in a week   David Haws, MD

## 2016-09-25 ENCOUNTER — Ambulatory Visit (INDEPENDENT_AMBULATORY_CARE_PROVIDER_SITE_OTHER): Payer: 59 | Admitting: Cardiovascular Disease

## 2016-09-25 ENCOUNTER — Ambulatory Visit (INDEPENDENT_AMBULATORY_CARE_PROVIDER_SITE_OTHER): Payer: 59 | Admitting: *Deleted

## 2016-09-25 ENCOUNTER — Telehealth: Payer: Self-pay

## 2016-09-25 ENCOUNTER — Encounter: Payer: Self-pay | Admitting: Cardiovascular Disease

## 2016-09-25 VITALS — BP 124/82 | HR 101 | Ht 72.0 in | Wt 196.0 lb

## 2016-09-25 DIAGNOSIS — Z5181 Encounter for therapeutic drug level monitoring: Secondary | ICD-10-CM

## 2016-09-25 DIAGNOSIS — I4891 Unspecified atrial fibrillation: Secondary | ICD-10-CM

## 2016-09-25 LAB — POCT INR: INR: 1.4

## 2016-09-25 MED ORDER — WARFARIN SODIUM 5 MG PO TABS: ORAL_TABLET | ORAL | 3 refills | Status: DC

## 2016-09-25 MED ORDER — LISINOPRIL 10 MG PO TABS: 10.0000 mg | ORAL_TABLET | Freq: Every day | ORAL | 3 refills | Status: DC

## 2016-09-25 MED ORDER — CARVEDILOL 12.5 MG PO TABS: 12.5000 mg | ORAL_TABLET | Freq: Two times a day (BID) | ORAL | 3 refills | Status: DC

## 2016-09-25 MED ORDER — ALLOPURINOL 300 MG PO TABS: 300.0000 mg | ORAL_TABLET | Freq: Every day | ORAL | 5 refills | Status: DC

## 2016-09-25 MED ORDER — FUROSEMIDE 40 MG PO TABS: 20.0000 mg | ORAL_TABLET | Freq: Every day | ORAL | 6 refills | Status: DC

## 2016-09-25 NOTE — Patient Instructions (Addendum)
Medication Instructions:  INCREASE LISINOPRIL 10 MG DAILY  INCREASE COREG 12.5 MG - TWO TIMES DAILY   Labwork: NONE  Testing/Procedures: NONE  Follow-Up: Your physician recommends that you schedule a follow-up appointment in: 6-8 WEEKS DR. MCDOWELL Your physician recommends that you schedule a follow-up appointment in: COUMADIN CLINIC 2 WEEKS     Any Other Special Instructions Will Be Listed Below (If Applicable).     If you need a refill on your cardiac medications before your next appointment, please call your pharmacy.

## 2016-09-25 NOTE — Telephone Encounter (Signed)
Spoke to pt, let him know to take 5 mg of coumadin daily except for Mon, Thurs- take 7.5 mg. Pt voiced understanding. He will make a follow up appointment in 2 weeks with coumadin clinic.

## 2016-10-01 ENCOUNTER — Emergency Department (HOSPITAL_COMMUNITY): Payer: 59

## 2016-10-01 ENCOUNTER — Inpatient Hospital Stay (HOSPITAL_COMMUNITY)
Admission: EM | Admit: 2016-10-01 | Discharge: 2016-10-09 | DRG: 286 | Disposition: A | Discharge status: Home / Self Care | Payer: 59 | Attending: Cardiovascular Disease | Admitting: Cardiovascular Disease

## 2016-10-01 ENCOUNTER — Encounter (HOSPITAL_COMMUNITY): Payer: Self-pay | Admitting: Emergency Medicine

## 2016-10-01 DIAGNOSIS — N179 Acute kidney failure, unspecified: Secondary | ICD-10-CM | POA: Diagnosis present

## 2016-10-01 DIAGNOSIS — I5043 Acute on chronic combined systolic (congestive) and diastolic (congestive) heart failure: Secondary | ICD-10-CM | POA: Diagnosis present

## 2016-10-01 DIAGNOSIS — I130 Hypertensive heart and chronic kidney disease with heart failure and stage 1 through stage 4 chronic kidney disease, or unspecified chronic kidney disease: Secondary | ICD-10-CM | POA: Diagnosis present

## 2016-10-01 DIAGNOSIS — I214 Non-ST elevation (NSTEMI) myocardial infarction: Secondary | ICD-10-CM

## 2016-10-01 DIAGNOSIS — I248 Other forms of acute ischemic heart disease: Secondary | ICD-10-CM | POA: Diagnosis present

## 2016-10-01 DIAGNOSIS — I5023 Acute on chronic systolic (congestive) heart failure: Secondary | ICD-10-CM

## 2016-10-01 DIAGNOSIS — I428 Other cardiomyopathies: Secondary | ICD-10-CM | POA: Diagnosis not present

## 2016-10-01 DIAGNOSIS — Z96652 Presence of left artificial knee joint: Secondary | ICD-10-CM | POA: Diagnosis present

## 2016-10-01 DIAGNOSIS — I4891 Unspecified atrial fibrillation: Secondary | ICD-10-CM | POA: Diagnosis not present

## 2016-10-01 DIAGNOSIS — I5020 Unspecified systolic (congestive) heart failure: Secondary | ICD-10-CM | POA: Diagnosis present

## 2016-10-01 DIAGNOSIS — Z9114 Patient's other noncompliance with medication regimen: Secondary | ICD-10-CM

## 2016-10-01 DIAGNOSIS — I420 Dilated cardiomyopathy: Secondary | ICD-10-CM | POA: Diagnosis present

## 2016-10-01 DIAGNOSIS — I272 Pulmonary hypertension, unspecified: Secondary | ICD-10-CM | POA: Diagnosis present

## 2016-10-01 DIAGNOSIS — I340 Nonrheumatic mitral (valve) insufficiency: Secondary | ICD-10-CM | POA: Diagnosis not present

## 2016-10-01 DIAGNOSIS — K219 Gastro-esophageal reflux disease without esophagitis: Secondary | ICD-10-CM | POA: Diagnosis present

## 2016-10-01 DIAGNOSIS — R748 Abnormal levels of other serum enzymes: Secondary | ICD-10-CM

## 2016-10-01 DIAGNOSIS — N182 Chronic kidney disease, stage 2 (mild): Secondary | ICD-10-CM | POA: Diagnosis present

## 2016-10-01 DIAGNOSIS — I481 Persistent atrial fibrillation: Secondary | ICD-10-CM | POA: Diagnosis present

## 2016-10-01 DIAGNOSIS — E876 Hypokalemia: Secondary | ICD-10-CM | POA: Diagnosis not present

## 2016-10-01 DIAGNOSIS — Z7901 Long term (current) use of anticoagulants: Secondary | ICD-10-CM | POA: Diagnosis not present

## 2016-10-01 DIAGNOSIS — Z8673 Personal history of transient ischemic attack (TIA), and cerebral infarction without residual deficits: Secondary | ICD-10-CM | POA: Diagnosis not present

## 2016-10-01 DIAGNOSIS — Z9119 Patient's noncompliance with other medical treatment and regimen: Secondary | ICD-10-CM | POA: Diagnosis not present

## 2016-10-01 DIAGNOSIS — E785 Hyperlipidemia, unspecified: Secondary | ICD-10-CM | POA: Diagnosis present

## 2016-10-01 DIAGNOSIS — R0600 Dyspnea, unspecified: Secondary | ICD-10-CM

## 2016-10-01 DIAGNOSIS — M109 Gout, unspecified: Secondary | ICD-10-CM | POA: Diagnosis present

## 2016-10-01 DIAGNOSIS — E7800 Pure hypercholesterolemia, unspecified: Secondary | ICD-10-CM | POA: Diagnosis not present

## 2016-10-01 DIAGNOSIS — N170 Acute kidney failure with tubular necrosis: Secondary | ICD-10-CM | POA: Diagnosis not present

## 2016-10-01 DIAGNOSIS — I482 Chronic atrial fibrillation: Secondary | ICD-10-CM | POA: Diagnosis not present

## 2016-10-01 LAB — TROPONIN I
Troponin I: 0.5 ng/mL (ref <0.03)
Troponin I: 0.41 ng/mL (ref <0.03)
Troponin I: 0.34 ng/mL (ref <0.03)

## 2016-10-01 LAB — BASIC METABOLIC PANEL
Anion gap: 9 (ref 5–15)
BUN: 30 mg/dL — ABNORMAL HIGH (ref 6–20)
CO2: 21 mmol/L — ABNORMAL LOW (ref 22–32)
Calcium: 7.9 mg/dL — ABNORMAL LOW (ref 8.9–10.3)
Chloride: 102 mmol/L (ref 101–111)
Creatinine, Ser: 1.5 mg/dL — ABNORMAL HIGH (ref 0.61–1.24)
GFR calc Af Amer: 59 mL/min — ABNORMAL LOW (ref >60)
GFR calc non Af Amer: 51 mL/min — ABNORMAL LOW (ref >60)
Glucose, Bld: 152 mg/dL — ABNORMAL HIGH (ref 65–99)
Potassium: 3.7 mmol/L (ref 3.5–5.1)
Sodium: 132 mmol/L — ABNORMAL LOW (ref 135–145)

## 2016-10-01 LAB — ECHOCARDIOGRAM COMPLETE
E decel time: 187 msec
E/e' ratio: 21.57
FS: 9 % — AB (ref 28–44)
Height: 72 in
IVS/LV PW RATIO, ED: 0.83
LA ID, A-P, ES: 54 mm
LA diam end sys: 54 mm
LA diam index: 2.53 cm/m2
LA vol A4C: 97.1 ml
LA vol index: 31.4 mL/m2
LA vol: 67.1 mL
LV E/e' medial: 21.57
LV E/e'average: 21.57
LV PW d: 11.8 mm — AB (ref 0.6–1.1)
LVOT area: 3.14 cm2
LVOT diameter: 20 mm
Lateral S' vel: 5.43 cm/s
MV Dec: 187
MV Peak grad: 3 mmHg
MV pk A vel: 51.4 m/s
MV pk E vel: 83.9 m/s
RV sys press: 39 mmHg
Reg peak vel: 299 cm/s
TAPSE: 11.7 mm
TDI e' medial: 3.89
TR max vel: 299 cm/s
Weight: 3136 oz

## 2016-10-01 LAB — CBC WITH DIFFERENTIAL/PLATELET
Basophils Absolute: 0 10*3/uL (ref 0.0–0.1)
Basophils Relative: 0 %
Eosinophils Absolute: 0 10*3/uL (ref 0.0–0.7)
Eosinophils Relative: 0 %
HCT: 41.4 % (ref 39.0–52.0)
Hemoglobin: 13.6 g/dL (ref 13.0–17.0)
Lymphocytes Relative: 25 %
Lymphs Abs: 2.4 10*3/uL (ref 0.7–4.0)
MCH: 30.7 pg (ref 26.0–34.0)
MCHC: 32.9 g/dL (ref 30.0–36.0)
MCV: 93.5 fL (ref 78.0–100.0)
Monocytes Absolute: 1.6 10*3/uL — ABNORMAL HIGH (ref 0.1–1.0)
Monocytes Relative: 16 %
Neutro Abs: 5.7 10*3/uL (ref 1.7–7.7)
Neutrophils Relative %: 59 %
Platelets: 264 10*3/uL (ref 150–400)
RBC: 4.43 MIL/uL (ref 4.22–5.81)
RDW: 13.4 % (ref 11.5–15.5)
WBC: 9.7 10*3/uL (ref 4.0–10.5)

## 2016-10-01 LAB — BRAIN NATRIURETIC PEPTIDE: B Natriuretic Peptide: 1287 pg/mL — ABNORMAL HIGH (ref 0.0–100.0)

## 2016-10-01 LAB — COMPREHENSIVE METABOLIC PANEL
ALT: 73 U/L — ABNORMAL HIGH (ref 17–63)
AST: 38 U/L (ref 15–41)
Albumin: 2.7 g/dL — ABNORMAL LOW (ref 3.5–5.0)
Alkaline Phosphatase: 150 U/L — ABNORMAL HIGH (ref 38–126)
Anion gap: 9 (ref 5–15)
BUN: 31 mg/dL — ABNORMAL HIGH (ref 6–20)
CO2: 22 mmol/L (ref 22–32)
Calcium: 8 mg/dL — ABNORMAL LOW (ref 8.9–10.3)
Chloride: 104 mmol/L (ref 101–111)
Creatinine, Ser: 1.51 mg/dL — ABNORMAL HIGH (ref 0.61–1.24)
GFR calc Af Amer: 59 mL/min — ABNORMAL LOW (ref >60)
GFR calc non Af Amer: 51 mL/min — ABNORMAL LOW (ref >60)
Glucose, Bld: 124 mg/dL — ABNORMAL HIGH (ref 65–99)
Potassium: 3.4 mmol/L — ABNORMAL LOW (ref 3.5–5.1)
Sodium: 135 mmol/L (ref 135–145)
Total Bilirubin: 0.7 mg/dL (ref 0.3–1.2)
Total Protein: 5.5 g/dL — ABNORMAL LOW (ref 6.5–8.1)

## 2016-10-01 LAB — PROTIME-INR
INR: 1.58
INR: 1.69
Prothrombin Time: 19 seconds — ABNORMAL HIGH (ref 11.4–15.2)
Prothrombin Time: 20 seconds — ABNORMAL HIGH (ref 11.4–15.2)

## 2016-10-01 LAB — HEPARIN LEVEL (UNFRACTIONATED): Heparin Unfractionated: 0.51 IU/mL (ref 0.30–0.70)

## 2016-10-01 MED ORDER — POTASSIUM CHLORIDE CRYS ER 20 MEQ PO TBCR
20.0000 meq | EXTENDED_RELEASE_TABLET | Freq: Every day | ORAL | Status: DC
  Administered 2016-10-03 – 2016-10-04 (×2): 20 meq via ORAL
  Filled 2016-10-01 (×3): qty 1

## 2016-10-01 MED ORDER — SODIUM CHLORIDE 0.9 % IV SOLN: 250.0000 mL | INTRAVENOUS | Status: DC | PRN

## 2016-10-01 MED ORDER — HEPARIN (PORCINE) IN NACL 100-0.45 UNIT/ML-% IJ SOLN
1350.0000 [IU]/h | INTRAMUSCULAR | Status: DC
  Administered 2016-10-01 – 2016-10-02 (×2): 1250 [IU]/h via INTRAVENOUS
  Filled 2016-10-01 (×2): qty 250

## 2016-10-01 MED ORDER — FUROSEMIDE 10 MG/ML IJ SOLN
20.0000 mg | Freq: Once | INTRAMUSCULAR | Status: AC
  Administered 2016-10-01: 20 mg via INTRAVENOUS
  Filled 2016-10-01: qty 2

## 2016-10-01 MED ORDER — CARVEDILOL 3.125 MG PO TABS
3.1250 mg | ORAL_TABLET | Freq: Two times a day (BID) | ORAL | Status: DC
  Administered 2016-10-01 – 2016-10-03 (×3): 3.125 mg via ORAL
  Filled 2016-10-01 (×3): qty 1

## 2016-10-01 MED ORDER — SODIUM CHLORIDE 0.9% FLUSH: 3.0000 mL | Freq: Two times a day (BID) | INTRAVENOUS | Status: DC

## 2016-10-01 MED ORDER — SODIUM CHLORIDE 0.9% FLUSH: 3.0000 mL | INTRAVENOUS | Status: DC | PRN

## 2016-10-01 MED ORDER — WARFARIN - PHARMACIST DOSING INPATIENT: Status: DC

## 2016-10-01 MED ORDER — SODIUM CHLORIDE 0.9 % IV SOLN
INTRAVENOUS | Status: DC
  Administered 2016-10-02: 05:00:00 via INTRAVENOUS

## 2016-10-01 MED ORDER — HEPARIN BOLUS VIA INFUSION
2000.0000 [IU] | Freq: Once | INTRAVENOUS | Status: AC
  Administered 2016-10-01: 2000 [IU] via INTRAVENOUS

## 2016-10-01 MED ORDER — ASPIRIN 81 MG PO CHEW
81.0000 mg | CHEWABLE_TABLET | ORAL | Status: AC
  Administered 2016-10-02: 81 mg via ORAL
  Filled 2016-10-01: qty 1

## 2016-10-01 MED ORDER — ALLOPURINOL 300 MG PO TABS
300.0000 mg | ORAL_TABLET | Freq: Every day | ORAL | Status: DC
  Administered 2016-10-01 – 2016-10-09 (×9): 300 mg via ORAL
  Filled 2016-10-01 (×9): qty 1

## 2016-10-01 MED ORDER — POTASSIUM CHLORIDE CRYS ER 20 MEQ PO TBCR
20.0000 meq | EXTENDED_RELEASE_TABLET | Freq: Every day | ORAL | Status: DC
  Administered 2016-10-01 – 2016-10-04 (×4): 20 meq via ORAL
  Filled 2016-10-01 (×4): qty 1

## 2016-10-01 MED ORDER — POTASSIUM CHLORIDE CRYS ER 20 MEQ PO TBCR
40.0000 meq | EXTENDED_RELEASE_TABLET | Freq: Once | ORAL | Status: AC
  Administered 2016-10-01: 40 meq via ORAL
  Filled 2016-10-01: qty 2

## 2016-10-01 MED ORDER — FUROSEMIDE 20 MG PO TABS
20.0000 mg | ORAL_TABLET | Freq: Every day | ORAL | Status: DC
  Administered 2016-10-01 – 2016-10-03 (×3): 20 mg via ORAL
  Filled 2016-10-01 (×3): qty 1

## 2016-10-01 NOTE — Consult Note (Addendum)
Cardiology Consultation:   Patient ID: David [name]; [id number]; [birth year]   Admit date: 10/01/2016 Date of Consult: 10/01/2016  Primary Care Provider: Salley Scarlet, MD Primary Cardiologist: Diona Browner Primary Electrophysiologist:  NA   Patient Profile:   [name] is a 56 y.o. male with a hx of persistent atrial fib, ICM, HTN, and medical non-compliance,,  who is being seen today for the evaluation of dyspnea and elevated troponin at the request of Dr,Zammit, ER physician.  History of Present Illness:   [name] is a 74 male recently seen by Dr. Eden Emms in the office on 09/25/2016 For ongoing assessment of atrial fibrillation, nonischemic cardiopathy with last EF 40-45% on 02/21/2016, hypertension, with history of alcohol abuse.   The patient is normally followed by Dr. Diona Browner, but he had been out of his medications for 3 months, was beginning to have issues with chest pain, and some shortness of breath, therefore his wife requested that he be seen sooner than scheduled appointment.   Dr. Eden Emms restarted him on his medications that he had been taking in the past, but increased carvedilol to 12.5 mg twice a day and increased Zestril to 10 mg daily. CHADS VASC score was 3, he was unable to afford DOAC, and therefore restarted on Coumadin with follow-up in our office Coumadin clinic. At that time he denied any chest pain or palpitations.   The patient presented to the emergency room with complaints of lower extremity edema, along with PND and worsening dyspnea on exertion. He states that walking across room causes significant dyspnea, he awakes at night time as he is unable to breathe and has to sit up, he is also noted worsening lower extremity edema compared to last week when he was seen by Dr. Eden Emms. He denies medical noncompliance and has been taking the diuretic as prescribed, with no improvement in his symptoms. He also complains of "sticking chest pain" moving  around his chest. He does state that he wasn't able to fill his potassium prescription yet.  On arrival to ER blood pressure 124/82 O2 sat 94% heart rate 101. CBC was within normal limits. He was found to be mildly hypokalemic with potassium of 3.4, glucose 124, BUN 31, creatinine 1.51. Protein and albumin were also low. BNP elevated at 1.287. Troponin elevated at 0.50. Chest x-ray revealed stable cardiomegaly, no evidence of CHF or pneumonia, questionable small pleural effusion or atelectasis in the left lung base. EKG revealed atrial fibrillation with T-wave inversion in the lateral leads compared to EKG on 09/25/2016, with no T-wave inversions noted at that time.   Past Medical History:  Diagnosis Date  . Alcohol abuse   . Arthritis    Bilateral knees   . Atrial fibrillation (HCC)   . Chronic kidney disease, stage 2, mildly decreased GFR   . GERD (gastroesophageal reflux disease)   . Gout   . Nonischemic cardiomyopathy (HCC)    LVEF 20-25% improved to 50% on medical therapy  . TIA (transient ischemic attack)     Past Surgical History:  Procedure Laterality Date  . Arthroscopic knee surgery Right 1987  . CARDIOVERSION  10/08/2011   Procedure: CARDIOVERSION;  Surgeon: Jonelle Sidle, MD;  Location: [facility] ORS;  Service: Cardiovascular;  Laterality: N/A;  Done in PACU  . CARDIOVERSION  01/10/2012   Procedure: CARDIOVERSION;  Surgeon: Marinus Maw, MD;  Location[facility] ENDOSCOPY;  Service: Cardiovascular;  Laterality: N/A;  . CARDIOVERSION N/A 11/17/2014   Procedure: CARDIOVERSION;  Surgeon: Laqueta Linden, MD;  Location: [facility] ORS;  Service: Cardiovascular;  Laterality: N/A;  . TEE WITHOUT CARDIOVERSION N/A 11/17/2014   Procedure: TRANSESOPHAGEAL ECHOCARDIOGRAM (TEE);  Surgeon: Laqueta Linden, MD;  Location: [facility]S;  Service: Cardiovascular;  Laterality: N/A;  . TOTAL KNEE ARTHROPLASTY Left 01/16/2013   Procedure: TOTAL KNEE ARTHROPLASTY;  Surgeon: Vickki Hearing, MD;  Location: [facility]  ORS;  Service: Orthopedics;  Laterality: Left;     Inpatient Medications: Scheduled Meds:  Continuous Infusions:  PRN Meds:   Allergies:    Allergies  Allergen Reactions  . Azithromycin Itching    Social History:   Social History   Social History  . Marital status: Married    Spouse name: N/A  . Number of children: N/A  . Years of education: N/A   Occupational History  . Not on file.   Social History Main Topics  . Smoking status: Never Smoker  . Smokeless tobacco: Former Neurosurgeon    Types: Snuff     Comment: quit 1980's  . Alcohol use 0.6 oz/week    1 Cans of beer per week     Comment: last beer 01/12/2013 but sts i am quitting  . Drug use: No     Comment: last time 2 years ago  . Sexual activity: Yes    Partners: Male    Birth control/ protection: None   Other Topics Concern  . Not on file   Social History Narrative   Lives in [place] with his sister   Carmela Hurt off from [employer].    Family History:   The patient's family history includes Hypertension in his brother, mother, and sister.  ROS:  Please see the history of present illness.  ROS  All other ROS reviewed and negative.     Physical Exam/Data:   Vitals:   10/01/16 0743 10/01/16 0800 10/01/16 0830 10/01/16 0900  BP: 99/74 101/87 98/80 96/82   Pulse: 93 93 93   Resp: (!) 22 14 19  (!) 21  Temp: 97.6 F (36.4 C)     TempSrc: Oral     SpO2:  100% 100%   Weight:      Height:       No intake or output data in the 24 hours ending 10/01/16 0918 Filed Weights   10/01/16 0742  Weight: 196 lb (88.9 kg)   Body mass index is 26.58 kg/m.  General:  Well nourished, well developed, in no acute distress.  HEENT: normal Lymph: no adenopathy Neck: no JVD Endocrine:  No thryomegaly Vascular: No carotid bruits; FA pulses 2+ bilaterally without bruits  Cardiac:  IRRR; no murmur  Lungs:  clear to auscultation bilaterally, no wheezing, rhonchi or rales  Abd: soft, nontender, no hepatomegaly  Ext:  no edema Musculoskeletal:  No deformities, BUE and BLE strength normal and equal, 1+ to 2+ pitting team a bilateral lower extremities pretibially, right greater than left. Well-healed scar on the left knee from prior surgery. Skin: warm and dry  Neuro:  CNs 2-12 intact, no focal abnormalities noted Psych:  Normal affect   EKG:  The EKG was personally reviewed and demonstrates: Atrial fibrillation with T-wave inversions laterally compared to prior EKG on 09/25/2016   Telemetry:  Telemetry was personally reviewed and demonstrates: Atrial fibrillation.  Relevant CV Studies: Echocardiogram 02/21/2016 Left ventricle: Diffuse hypokinesis worse in the septum and apex.   The cavity size was moderately dilated. Wall thickness was   increased in a pattern of mild LVH. Systolic function was  mildly   to moderately reduced. The estimated ejection fraction was in the   range of 40% to 45%. The study is not technically sufficient to   allow evaluation of LV diastolic function. - Aortic valve: There was mild regurgitation. - Atrial septum: No defect or patent foramen ovale was identified. - Impressions: EF has decreased since echo done 2016 but better   than 2013 when it was 20-25%  Impressions: EF has decreased since echo done 2016 but better than 2013 when   it was 20-25%  Stress MPI: 06/19/2011 IMPRESSION: Abnormal Lexiscan Myoview as outlined.  The patient remained in atrial fibrillation throughout, had no diagnostic ST-segment changes, and reported no chest pain.  He was hemodynamically stable.  Perfusion imaging shows diaphragmatic attenuation with probable soft tissue attenuation versus scar affecting the inferior wall, however no clear ischemia.  LVEF is reduced to 22% with diffuse hypokinesis and dilated ventricle. Overall pattern could be consistent with nonischemic cardiomyopathy.  Laboratory Data:  Chemistry Recent Labs Lab 10/01/16 0813  NA 135  K 3.4*  CL 104  CO2 22    GLUCOSE 124*  BUN 31*  CREATININE 1.51*  CALCIUM 8.0*  GFRNONAA 51*  GFRAA 59*  ANIONGAP 9     Recent Labs Lab 10/01/16 0813  PROT 5.5*  ALBUMIN 2.7*  AST 38  ALT 73*  ALKPHOS 150*  BILITOT 0.7   Hematology Recent Labs Lab 10/01/16 0813  WBC 9.7  RBC 4.43  HGB 13.6  HCT 41.4  MCV 93.5  MCH 30.7  MCHC 32.9  RDW 13.4  PLT 264   Cardiac Enzymes Recent Labs Lab 10/01/16 0813  TROPONINI 0.50*   No results for input(s): TROPIPOC in the last 168 hours.  BNP Recent Labs Lab 10/01/16 0813  BNP 1,287.0*    DDimer No results for input(s): DDIMER in the last 168 hours.  Radiology/Studies:  Dg Chest 2 View  Result Date: 10/01/2016 CLINICAL DATA:  SOB AND BIL LEG SWELLING X 1+ WEEKS, A-FIB, NON SMOKER EXAM: CHEST  2 VIEW COMPARISON:  Chest x-ray dated 12/05/2013. FINDINGS: Stable cardiomegaly. Overall cardiomediastinal silhouette is stable. Questionable small pleural effusion and/or mild atelectasis at the left lung base. Lungs otherwise clear. No pleural effusion or pneumothorax. No acute or suspicious osseous finding. IMPRESSION: Stable cardiomegaly. No active cardiopulmonary disease. Questionable small pleural effusion and/or atelectasis at the left lung base. No evidence of CHF or pneumonia. Electronically Signed   By: Bary Richard M.D.   On: 10/01/2016 08:47    Assessment and Plan:   1. Dyspnea: Worsening symptoms over the last week. He saw Dr. Eden Emms on 09/25/2016 and was restarted on all medications that he had been taking in the past but had not taken for approximate 3 months. This did include new Rx for Coumadin as he was unable to afford DOAC.   The patient states she was taking his medications as directed, and getting diuresis from return to Lasix 20 mg daily. However he began to have worsening dyspnea on exertion, waking up at night flushed, feeling hot, with symptoms of PND and orthopnea. He also states that walking across a room has caused him to have  worsening dyspnea. Chest x-ray does not reveal decompensated CHF, he does have elevated BNP. He states his weight was 10 pounds higher in our office compared to prior weight of 186 pounds (weight in our office 196 pounds).    2.  Elevated troponin: Troponin level 0.050 on first left. He does have changes in  his EKG with T-wave inversion laterally which are new compared to prior EKG in our office one week ago. Would continue to cycle enzymes. With symptoms of worsening dyspnea and "sticking" chest pain along with fatigue, may need to consider ischemic testing with cardiac catheterization. We'll discuss this further with Dr. Wyline Mood, he is currently not having any complaints of chest pain at this time.  3. History of nonischemic cardiopathy: Most recent echocardiogram was in 2017 with improvement in LV systolic function to 40% to 45%, compared to prior studies with EF of 20-25%. We'll repeat echocardiogram for changes in LV function to assist with medical management.  4. Atrial fibrillation: Rate is currently Mildly elevated, 100s to 90s. He has not yet taken his medications today. He is recently been started on Coumadin. PT 19.1 INR 1.58. Continue Coumadin therapy per pharmacy unless catheterization is planned. Continue carvedilol.  5. Worsening lower extremity edema: Patient has been taking Lasix 20 mg as directed. He states his edema has worsened. We'll give one dose of IV Lasix, potassium replacement.   6. History of chronic kidney disease stage II: Creatinine on admission 1.51, compared to prior creatinine in August 2016 at 1.37.  Signed, Joni Reining DNP, ANP.  10/01/2016 9:18 AM   Patient seen and discussed with DNP Lyman Bishop, I agree with her documentation above. 56 yo male history of afib, EtOH abuse, CKD, prior TIA,  chronic systolic HF with LVEF as low as 20-25% but most recent LVEF 40-45%. Notes mention a history of NICM that looks to be based on a  Nuclear stress 06/2011 inferior scar vs  attenuation, no clear ischemia. Cr around 1.7 at the time of stress, looks like with noninvasive results and poor renal function did not have cath.   Presents with progressing SOB and LE edema. Also for 1 month intermittent sharp left sided chest pain into left shoulder, 7/10, worst with positioning. Recent poor medication compliance.    Hgb 13.6, Plt 264, K 3.4, Cr 1.51, BNP 1287, INR 1.6 CXR no acute process EKG afib, chronic lateral precordial ST depressions/TWI more prominent.  Acute on chronic systolic HF in setting of recent medication noncompliance. Worst swelling he reports in years, I am worried about worsening of his LV function. Will require admission for IV diuresis. Trop of 0.50 in setting of CHF and atypical chest pain. We will repeat troponin in ER and also obtain echo in ER, if significant changes would transfer to [facility]. If stable or trending down trop and stable echo, would consider admission here for initial medical therapy. Start heparin gtt, hold coumadin at this time in case invasive testing is indicated. ER dispo pending f/u troponin and echo.    Dominga Ferry MD  Addendum 140pm Repeat troponin trending down. Echo however shows drop in LVEF to 15-20%. He will require transfer to [facility], will need RHC/LHC pending renal function and once more euvolemic. Some soft bp's in ER, we have downtitrated his coreg to 3.125mg  bid and held lisinopril, adjust as tolerated this admission.   Dominga Ferry MD

## 2016-10-01 NOTE — ED Notes (Signed)
Paged Dr Wyline Mood due to low Bps.

## 2016-10-01 NOTE — ED Notes (Signed)
Cardiologist NP in to see pt

## 2016-10-01 NOTE — Progress Notes (Signed)
ANTICOAGULATION CONSULT NOTE - Initial Consult  Pharmacy Consult for Heparin / Warfarin Indication: ACS / afib / resume home med / warfarin  Allergies  Allergen Reactions  . Azithromycin Itching   Patient Measurements: Height: 5\' 11"  (180.3 cm) Weight: 203 lb 12.8 oz (92.4 kg) IBW/kg (Calculated) : 75.3 HEPARIN DW (KG): 92.4  Vital Signs: Temp: 97.6 F (36.4 C) (06/25 0743) Temp Source: Oral (06/25 0743) BP: 107/73 (06/25 1730) Pulse Rate: 95 (06/25 1730)  Labs:  Recent Labs  10/01/16 0813 10/01/16 0823 10/01/16 1016 10/01/16 1556  HGB 13.6  --   --   --   HCT 41.4  --   --   --   PLT 264  --   --   --   LABPROT  --  19.0*  --  20.0*  INR  --  1.58  --  1.69  HEPARINUNFRC  --   --   --  0.51  CREATININE 1.51*  --   --  1.50*  TROPONINI 0.50*  --  0.41* 0.34*   Estimated Creatinine Clearance: 65.4 mL/min (A) (by C-G formula based on SCr of 1.5 mg/dL (H)).  Medical History: Past Medical History:  Diagnosis Date  . Alcohol abuse   . Arthritis    Bilateral knees   . Atrial fibrillation (HCC)   . Chronic kidney disease, stage 2, mildly decreased GFR   . GERD (gastroesophageal reflux disease)   . Gout   . Nonischemic cardiomyopathy (HCC)    LVEF 20-25% improved to 50% on medical therapy  . TIA (transient ischemic attack)    Medications:  Prescriptions Prior to Admission  Medication Sig Dispense Refill Last Dose  . allopurinol (ZYLOPRIM) 300 MG tablet Take 1 tablet (300 mg total) by mouth daily. 30 tablet 5 09/30/2016 at Unknown time  . carvedilol (COREG) 12.5 MG tablet Take 1 tablet (12.5 mg total) by mouth 2 (two) times daily. 60 tablet 3 09/30/2016 at 2030  . furosemide (LASIX) 40 MG tablet Take 0.5 tablets (20 mg total) by mouth daily. 30 tablet 6 09/30/2016 at Unknown time  . potassium chloride (KLOR-CON M10) 10 MEQ tablet TAKE ONE TABLET BY MOUTH ONCE DAILY IN THE MORNING 30 tablet 6 unknown    Assessment: Pt on chronic Coumadin PTA.  INR 1.5 today upon  presentation to ED.  Pt with h/o afib and medical non-compliance.   Hep gtt for r/o ACS Transfer from aph on 1500 units/hr hep  Cbc stable Initial lvl with goal 0.51  Goal of Therapy:  Heparin level 0.3-0.7 units/ml Monitor platelets by anticoagulation protocol: Yes     Plan:  Continue heparin 1250 unit/hr Next lvl am labs F/u cards plans for [facility] F/U restart warfarin  Isaac Bliss, PharmD, BCPS, BCCCP Clinical Pharmacist 10/01/2016 6:10 PM

## 2016-10-01 NOTE — ED Notes (Addendum)
Spoke with Dr Wyline Mood , No new orders since pt stable. Per Dr Wyline Mood pt will be transferred to [facility]

## 2016-10-01 NOTE — ED Triage Notes (Signed)
Pt reports having leg swelling for the past week with some shortness of breath.  States he saw cardiologist Tuesday and some of his medications were doubled, but not sure which ones.

## 2016-10-01 NOTE — Progress Notes (Signed)
ANTICOAGULATION CONSULT NOTE - Initial Consult  Pharmacy Consult for Heparin / Warfarin Indication: ACS / afib / resume home med / warfarin  Allergies  Allergen Reactions  . Azithromycin Itching   Patient Measurements: Height: 6' (182.9 cm) Weight: 196 lb (88.9 kg) IBW/kg (Calculated) : 77.6 HEPARIN DW (KG): 88.9  Vital Signs: Temp: 97.6 F (36.4 C) (06/25 0743) Temp Source: Oral (06/25 0743) BP: 96/82 (06/25 0900) Pulse Rate: 93 (06/25 0830)  Labs:  Recent Labs  10/01/16 0813 10/01/16 0823  HGB 13.6  --   HCT 41.4  --   PLT 264  --   LABPROT  --  19.0*  INR  --  1.58  CREATININE 1.51*  --   TROPONINI 0.50*  --    Estimated Creatinine Clearance: 61.4 mL/min (A) (by C-G formula based on SCr of 1.51 mg/dL (H)).  Medical History: Past Medical History:  Diagnosis Date  . Alcohol abuse   . Arthritis    Bilateral knees   . Atrial fibrillation (HCC)   . Chronic kidney disease, stage 2, mildly decreased GFR   . GERD (gastroesophageal reflux disease)   . Gout   . Nonischemic cardiomyopathy (HCC)    LVEF 20-25% improved to 50% on medical therapy  . TIA (transient ischemic attack)    Medications:   (Not in a hospital admission)  Assessment: Pt on chronic Coumadin PTA.  INR 1.5 today upon presentation to ED.  Pt with h/o afib and medical non-compliance.  Pt presents to ED with dyspnea and elevated troponin.  Asked to initiate Heparin.  Goal of Therapy:  Heparin level 0.3-0.7 units/ml Monitor platelets by anticoagulation protocol: Yes  INR 2-3   Plan:   Heparin 2000 units IV now x 1 (due to elevated INR)  Heparin infusion at 1250 units/hr  Heparin level in 6-8 hrs then daily  Coumadin 7.5mg  this evening unless plan changes  CBC, INR, and HL daily   Lizet Kelso A 10/01/2016,10:14 AM

## 2016-10-01 NOTE — ED Notes (Signed)
Date and time results received: 10/01/16 8:58 AM (use smartphrase ".now" to insert current time)  Test: Troponin Critical Value: 0.50  Name of Provider Notified: Zammit  Orders Received? Or Actions Taken?: Orders Received - See Orders for details

## 2016-10-01 NOTE — ED Provider Notes (Signed)
[facility] Provider Note   CSN: [id number] Arrival date & time: 10/01/16  0735     History   Chief Complaint Chief Complaint  Patient presents with  . Leg Swelling    HPI David [name] is a 56 y.o. male.  Patient states his legs have been swelling more and he has had some shortness of breath and chest pain at night    Weakness  Primary symptoms include no focal weakness. This is a recurrent problem. The current episode started 2 days ago. The problem has not changed since onset.There was no focality noted. There has been no fever. Associated symptoms include shortness of breath. Pertinent negatives include no chest pain and no headaches. There were no medications administered prior to arrival. Associated medical issues do not include trauma.    Past Medical History:  Diagnosis Date  . Alcohol abuse   . Arthritis    Bilateral knees   . Atrial fibrillation (HCC)   . Chronic kidney disease, stage 2, mildly decreased GFR   . GERD (gastroesophageal reflux disease)   . Gout   . Nonischemic cardiomyopathy (HCC)    LVEF 20-25% improved to 50% on medical therapy  . TIA (transient ischemic attack)     Patient Active Problem List   Diagnosis Date Noted  . Persistent atrial fibrillation (HCC)   . BPH (benign prostatic hypertrophy) 02/17/2014  . Head injury, unspecified 12/09/2013  . Alcohol abuse 12/09/2013  . Neck strain 12/09/2013  . Acid reflux 12/09/2013  . Encounter for therapeutic drug monitoring 05/28/2013  . S/P total knee replacement 04/07/2013  . Stiffness of left knee 02/05/2013  . Status post left knee replacement 01/29/2013  . OA (osteoarthritis) of knee 10/28/2012  . Routine general medical examination at a health care facility 09/25/2012  . Hyperlipidemia 09/25/2012  . Long term (current) use of anticoagulants 06/21/2011  . Secondary cardiomyopathy (HCC) 06/16/2011  . Atrial fibrillation (HCC) 06/15/2011  . CKD (chronic kidney disease)  stage 2, GFR 60-89 ml/min 06/15/2011    Past Surgical History:  Procedure Laterality Date  . Arthroscopic knee surgery Right 1987  . CARDIOVERSION  10/08/2011   Procedure: CARDIOVERSION;  Surgeon: Jonelle Sidle, MD;  Location: [facility]S;  Service: Cardiovascular;  Laterality: N/A;  Done in PACU  . CARDIOVERSION  01/10/2012   Procedure: CARDIOVERSION;  Surgeon: Marinus Maw, MD;  Location: [facility] ENDOSCOPY;  Service: Cardiovascular;  Laterality: N/A;  . CARDIOVERSION N/A 11/17/2014   Procedure: CARDIOVERSION;  Surgeon: Laqueta Linden, MD;  Location: [facility]S;  Service: Cardiovascular;  Laterality: N/A;  . TEE WITHOUT CARDIOVERSION N/A 11/17/2014   Procedure: TRANSESOPHAGEAL ECHOCARDIOGRAM (TEE);  Surgeon: Laqueta Linden, MD;  Location: [facility];  Service: Cardiovascular;  Laterality: N/A;  . TOTAL KNEE ARTHROPLASTY Left 01/16/2013   Procedure: TOTAL KNEE ARTHROPLASTY;  Surgeon: Vickki Hearing, MD;  Location: [facility];  Service: Orthopedics;  Laterality: Left;       Home Medications    Prior to Admission medications   Medication Sig Start Date End Date Taking? Authorizing Provider  allopurinol (ZYLOPRIM) 300 MG tablet Take 1 tablet (300 mg total) by mouth daily. 09/25/16  Yes Wendall Stade, MD  carvedilol (COREG) 12.5 MG tablet Take 1 tablet (12.5 mg total) by mouth 2 (two) times daily. 09/25/16 12/24/16 Yes Wendall Stade, MD  furosemide (LASIX) 40 MG tablet Take 0.5 tablets (20 mg total) by mouth daily. 09/25/16  Yes Wendall Stade, MD  potassium chloride (KLOR-CON M10)  10 MEQ tablet TAKE ONE TABLET BY MOUTH ONCE DAILY IN THE MORNING 05/21/16  Yes Jonelle Sidle, MD    Family History Family History  Problem Relation Age of Onset  . Hypertension Mother   . Hypertension Sister   . Hypertension Brother     Social History Social History  Substance Use Topics  . Smoking status: Never Smoker  . Smokeless tobacco: Former Neurosurgeon    Types: Snuff     Comment: quit 1980's  .  Alcohol use 0.6 oz/week    1 Cans of beer per week     Comment: last beer 01/12/2013 but sts i am quitting     Allergies   Azithromycin   Review of Systems Review of Systems  Constitutional: Negative for appetite change and fatigue.  HENT: Negative for congestion, ear discharge and sinus pressure.   Eyes: Negative for discharge.  Respiratory: Positive for shortness of breath. Negative for cough.   Cardiovascular: Negative for chest pain.  Gastrointestinal: Negative for abdominal pain and diarrhea.  Genitourinary: Negative for frequency and hematuria.  Musculoskeletal: Negative for back pain.       Swelling in legs  Skin: Negative for rash.  Neurological: Positive for weakness. Negative for focal weakness, seizures and headaches.  Psychiatric/Behavioral: Negative for hallucinations.     Physical Exam Updated Vital Signs BP (!) 84/68   Pulse 84   Temp 97.6 F (36.4 C) (Oral)   Resp (!) 22   Ht 6' (1.829 m)   Wt 88.9 kg (196 lb)   SpO2 100%   BMI 26.58 kg/m   Physical Exam  Constitutional: He is oriented to person, place, and time. He appears well-developed.  HENT:  Head: Normocephalic.  Eyes: Conjunctivae and EOM are normal. No scleral icterus.  Neck: Neck supple. No thyromegaly present.  Cardiovascular: Normal rate.  Exam reveals no gallop and no friction rub.   No murmur heard. Pulmonary/Chest: No stridor. He has no wheezes. He has no rales. He exhibits no tenderness.  Abdominal: He exhibits no distension. There is no tenderness. There is no rebound.  Musculoskeletal: Normal range of motion. He exhibits edema.  Lymphadenopathy:    He has no cervical adenopathy.  Neurological: He is oriented to person, place, and time. He exhibits normal muscle tone. Coordination normal.  Skin: No rash noted. No erythema.  Psychiatric: He has a normal mood and affect. His behavior is normal.     ED Treatments / Results  Labs (all labs ordered are listed, but only abnormal  results are displayed) Labs Reviewed  CBC WITH DIFFERENTIAL/PLATELET - Abnormal; Notable for the following:       Result Value   Monocytes Absolute 1.6 (*)    All other components within normal limits  COMPREHENSIVE METABOLIC PANEL - Abnormal; Notable for the following:    Potassium 3.4 (*)    Glucose, Bld 124 (*)    BUN 31 (*)    Creatinine, Ser 1.51 (*)    Calcium 8.0 (*)    Total Protein 5.5 (*)    Albumin 2.7 (*)    ALT 73 (*)    Alkaline Phosphatase 150 (*)    GFR calc non Af Amer 51 (*)    GFR calc Af Amer 59 (*)    All other components within normal limits  BRAIN NATRIURETIC PEPTIDE - Abnormal; Notable for the following:    B Natriuretic Peptide 1,287.0 (*)    All other components within normal limits  TROPONIN I - Abnormal; Notable  for the following:    Troponin I 0.50 (*)    All other components within normal limits  PROTIME-INR - Abnormal; Notable for the following:    Prothrombin Time 19.0 (*)    All other components within normal limits  TROPONIN I - Abnormal; Notable for the following:    Troponin I 0.41 (*)    All other components within normal limits  TROPONIN I  TROPONIN I  HEPARIN LEVEL (UNFRACTIONATED)    EKG  EKG Interpretation  Date/Time:  Monday October 01 2016 07:48:06 EDT Ventricular Rate:  93 PR Interval:    QRS Duration: 97 QT Interval:  357 QTC Calculation: 444 R Axis:   68 Text Interpretation:  Atrial fibrillation Low voltage, extremity leads Anteroseptal infarct, old Nonspecific T abnormalities, lateral leads Confirmed by Bethann Berkshire [phone]) on 10/01/2016 9:03:07 AM       Radiology Dg Chest 2 View  Result Date: 10/01/2016 CLINICAL DATA:  SOB AND BIL LEG SWELLING X 1+ WEEKS, A-FIB, NON SMOKER EXAM: CHEST  2 VIEW COMPARISON:  Chest x-ray dated 12/05/2013. FINDINGS: Stable cardiomegaly. Overall cardiomediastinal silhouette is stable. Questionable small pleural effusion and/or mild atelectasis at the left lung base. Lungs otherwise clear.  No pleural effusion or pneumothorax. No acute or suspicious osseous finding. IMPRESSION: Stable cardiomegaly. No active cardiopulmonary disease. Questionable small pleural effusion and/or atelectasis at the left lung base. No evidence of CHF or pneumonia. Electronically Signed   By: Bary Richard M.D.   On: 10/01/2016 08:47    Procedures Procedures (including critical care time)  Medications Ordered in ED Medications  potassium chloride SA (K-DUR,KLOR-CON) CR tablet 40 mEq (40 mEq Oral Given 10/01/16 1028)    Followed by  potassium chloride SA (K-DUR,KLOR-CON) CR tablet 20 mEq (not administered)  heparin ADULT infusion 100 units/mL (25000 units/2103mL sodium chloride 0.45%) (1,250 Units/hr Intravenous New Bag/Given 10/01/16 1033)  furosemide (LASIX) injection 20 mg (20 mg Intravenous Given 10/01/16 1029)  heparin bolus via infusion 2,000 Units (2,000 Units Intravenous Bolus from Bag 10/01/16 1035)     Initial Impression / Assessment and Plan / ED Course  I have reviewed the triage vital signs and the nursing notes.  Pertinent labs & imaging results that were available during my care of the patient were reviewed by me and considered in my medical decision making (see chart for details). CRITICAL CARE Performed by: Anastasija Anfinson L Total critical care time: Critical care time was exclusive of separately billable procedures and treating other patients. Critical care was necessary to treat or prevent imminent or life-threatening deterioration. Critical care was time spent personally by me on the following activities: development of treatment plan with patient and/or surrogate as well as nursing, discussions with consultants, evaluation of patient's response to treatment, examination of patient, obtaining history from patient or surrogate, ordering and performing treatments and interventions, ordering and review of laboratory studies, ordering and review of radiographic studies, pulse  oximetry and re-evaluation of patient's condition.     Patient with congestive heart failure. Patient was seen by cardiology and will be admitted at [place]  Final Clinical Impressions(s) / ED Diagnoses   Final diagnoses:  Systolic congestive heart failure, unspecified HF chronicity (HCC)    New Prescriptions New Prescriptions   No medications on file     Bethann Berkshire, MD 10/01/16 1345

## 2016-10-01 NOTE — ED Notes (Signed)
Patient transported to X-ray 

## 2016-10-01 NOTE — Progress Notes (Signed)
Pt educated about safety and importance of bed alarm during the night however pt refuses to be on bed alarm. Will continue to round on patient.   Rayona Sardinha, RN    

## 2016-10-01 NOTE — Progress Notes (Signed)
*  PRELIMINARY RESULTS* Echocardiogram 2D Echocardiogram has been performed.  David [name] 10/01/2016, 1:18 PM

## 2016-10-02 ENCOUNTER — Encounter (HOSPITAL_COMMUNITY)
Admission: EM | Disposition: A | Discharge status: Home / Self Care | Payer: Self-pay | Source: Home / Self Care | Attending: Cardiology

## 2016-10-02 DIAGNOSIS — I214 Non-ST elevation (NSTEMI) myocardial infarction: Secondary | ICD-10-CM

## 2016-10-02 DIAGNOSIS — I5020 Unspecified systolic (congestive) heart failure: Secondary | ICD-10-CM

## 2016-10-02 DIAGNOSIS — N182 Chronic kidney disease, stage 2 (mild): Secondary | ICD-10-CM

## 2016-10-02 DIAGNOSIS — I5043 Acute on chronic combined systolic (congestive) and diastolic (congestive) heart failure: Secondary | ICD-10-CM

## 2016-10-02 DIAGNOSIS — E7800 Pure hypercholesterolemia, unspecified: Secondary | ICD-10-CM

## 2016-10-02 DIAGNOSIS — N170 Acute kidney failure with tubular necrosis: Secondary | ICD-10-CM

## 2016-10-02 HISTORY — PX: RIGHT/LEFT HEART CATH AND CORONARY ANGIOGRAPHY: CATH118266

## 2016-10-02 LAB — HEPARIN LEVEL (UNFRACTIONATED)
Heparin Unfractionated: 0.26 IU/mL — ABNORMAL LOW (ref 0.30–0.70)
Heparin Unfractionated: 0.28 IU/mL — ABNORMAL LOW (ref 0.30–0.70)

## 2016-10-02 LAB — POCT I-STAT 3, ART BLOOD GAS (G3+)
Acid-base deficit: 3 mmol/L — ABNORMAL HIGH (ref 0.0–2.0)
Bicarbonate: 20.6 mmol/L (ref 20.0–28.0)
O2 Saturation: 99 %
TCO2: 22 mmol/L (ref 0–100)
pCO2 arterial: 32.3 mmHg (ref 32.0–48.0)
pH, Arterial: 7.413 (ref 7.350–7.450)
pO2, Arterial: 151 mmHg — ABNORMAL HIGH (ref 83.0–108.0)

## 2016-10-02 LAB — CBC
HCT: 38.6 % — ABNORMAL LOW (ref 39.0–52.0)
Hemoglobin: 12.5 g/dL — ABNORMAL LOW (ref 13.0–17.0)
MCH: 30.3 pg (ref 26.0–34.0)
MCHC: 32.4 g/dL (ref 30.0–36.0)
MCV: 93.5 fL (ref 78.0–100.0)
Platelets: 266 10*3/uL (ref 150–400)
RBC: 4.13 MIL/uL — ABNORMAL LOW (ref 4.22–5.81)
RDW: 13.5 % (ref 11.5–15.5)
WBC: 9.5 10*3/uL (ref 4.0–10.5)

## 2016-10-02 LAB — BASIC METABOLIC PANEL
Anion gap: 9 (ref 5–15)
BUN: 28 mg/dL — ABNORMAL HIGH (ref 6–20)
CO2: 23 mmol/L (ref 22–32)
Calcium: 7.9 mg/dL — ABNORMAL LOW (ref 8.9–10.3)
Chloride: 105 mmol/L (ref 101–111)
Creatinine, Ser: 1.56 mg/dL — ABNORMAL HIGH (ref 0.61–1.24)
GFR calc Af Amer: 56 mL/min — ABNORMAL LOW (ref >60)
GFR calc non Af Amer: 49 mL/min — ABNORMAL LOW (ref >60)
Glucose, Bld: 120 mg/dL — ABNORMAL HIGH (ref 65–99)
Potassium: 3.7 mmol/L (ref 3.5–5.1)
Sodium: 137 mmol/L (ref 135–145)

## 2016-10-02 LAB — POCT I-STAT 3, VENOUS BLOOD GAS (G3P V)
Acid-base deficit: 3 mmol/L — ABNORMAL HIGH (ref 0.0–2.0)
Acid-base deficit: 3 mmol/L — ABNORMAL HIGH (ref 0.0–2.0)
Bicarbonate: 21.6 mmol/L (ref 20.0–28.0)
Bicarbonate: 22 mmol/L (ref 20.0–28.0)
O2 Saturation: 41 %
O2 Saturation: 43 %
TCO2: 23 mmol/L (ref 0–100)
TCO2: 23 mmol/L (ref 0–100)
pCO2, Ven: 36.8 mmHg — ABNORMAL LOW (ref 44.0–60.0)
pCO2, Ven: 37.8 mmHg — ABNORMAL LOW (ref 44.0–60.0)
pH, Ven: 7.374 (ref 7.250–7.430)
pH, Ven: 7.376 (ref 7.250–7.430)
pO2, Ven: 24 mmHg — CL (ref 32.0–45.0)
pO2, Ven: 25 mmHg — CL (ref 32.0–45.0)

## 2016-10-02 LAB — PROTIME-INR
INR: 1.61
Prothrombin Time: 19.3 seconds — ABNORMAL HIGH (ref 11.4–15.2)

## 2016-10-02 SURGERY — RIGHT/LEFT HEART CATH AND CORONARY ANGIOGRAPHY
Anesthesia: LOCAL

## 2016-10-02 MED ORDER — FENTANYL CITRATE (PF) 100 MCG/2ML IJ SOLN
INTRAMUSCULAR | Status: AC
  Filled 2016-10-02: qty 2

## 2016-10-02 MED ORDER — SODIUM CHLORIDE 0.9 % IV SOLN
250.0000 mL | INTRAVENOUS | Status: DC | PRN
  Administered 2016-10-06: 250 mL via INTRAVENOUS

## 2016-10-02 MED ORDER — IOPAMIDOL (ISOVUE-370) INJECTION 76%
INTRAVENOUS | Status: DC | PRN
  Administered 2016-10-02: 50 mL via INTRA_ARTERIAL

## 2016-10-02 MED ORDER — HEPARIN (PORCINE) IN NACL 2-0.9 UNIT/ML-% IJ SOLN
INTRAMUSCULAR | Status: AC
  Filled 2016-10-02: qty 1000

## 2016-10-02 MED ORDER — ATORVASTATIN CALCIUM 80 MG PO TABS
80.0000 mg | ORAL_TABLET | Freq: Every day | ORAL | Status: DC
  Administered 2016-10-02 – 2016-10-03 (×2): 80 mg via ORAL
  Filled 2016-10-02 (×2): qty 1

## 2016-10-02 MED ORDER — ASPIRIN 81 MG PO CHEW
81.0000 mg | CHEWABLE_TABLET | Freq: Every day | ORAL | Status: DC
  Administered 2016-10-03 – 2016-10-04 (×2): 81 mg via ORAL
  Filled 2016-10-02 (×2): qty 1

## 2016-10-02 MED ORDER — ONDANSETRON HCL 4 MG/2ML IJ SOLN
4.0000 mg | Freq: Four times a day (QID) | INTRAMUSCULAR | Status: DC | PRN
  Administered 2016-10-03: 4 mg via INTRAVENOUS
  Filled 2016-10-02: qty 2

## 2016-10-02 MED ORDER — MIDAZOLAM HCL 2 MG/2ML IJ SOLN
INTRAMUSCULAR | Status: DC | PRN
  Administered 2016-10-02: 1 mg via INTRAVENOUS

## 2016-10-02 MED ORDER — HEPARIN SODIUM (PORCINE) 1000 UNIT/ML IJ SOLN
INTRAMUSCULAR | Status: DC | PRN
  Administered 2016-10-02: 5000 [IU] via INTRAVENOUS

## 2016-10-02 MED ORDER — DIAZEPAM 5 MG PO TABS
5.0000 mg | ORAL_TABLET | Freq: Four times a day (QID) | ORAL | Status: DC | PRN
  Administered 2016-10-03 – 2016-10-07 (×4): 5 mg via ORAL
  Filled 2016-10-02 (×4): qty 1

## 2016-10-02 MED ORDER — IOPAMIDOL (ISOVUE-370) INJECTION 76%
INTRAVENOUS | Status: AC
  Filled 2016-10-02: qty 100

## 2016-10-02 MED ORDER — ACETAMINOPHEN 325 MG PO TABS
650.0000 mg | ORAL_TABLET | ORAL | Status: DC | PRN
  Administered 2016-10-04 – 2016-10-06 (×2): 650 mg via ORAL
  Filled 2016-10-02 (×2): qty 2

## 2016-10-02 MED ORDER — VERAPAMIL HCL 2.5 MG/ML IV SOLN
INTRAVENOUS | Status: DC | PRN
  Administered 2016-10-02: 14:00:00 via INTRA_ARTERIAL

## 2016-10-02 MED ORDER — VERAPAMIL HCL 2.5 MG/ML IV SOLN
INTRAVENOUS | Status: AC
  Filled 2016-10-02: qty 2

## 2016-10-02 MED ORDER — HEPARIN (PORCINE) IN NACL 100-0.45 UNIT/ML-% IJ SOLN
1500.0000 [IU]/h | INTRAMUSCULAR | Status: DC
  Administered 2016-10-03: 1350 [IU]/h via INTRAVENOUS
  Administered 2016-10-05 – 2016-10-07 (×4): 1500 [IU]/h via INTRAVENOUS
  Filled 2016-10-02 (×9): qty 250

## 2016-10-02 MED ORDER — MIDAZOLAM HCL 2 MG/2ML IJ SOLN
INTRAMUSCULAR | Status: AC
  Filled 2016-10-02: qty 2

## 2016-10-02 MED ORDER — LIDOCAINE HCL (PF) 1 % IJ SOLN
INTRAMUSCULAR | Status: DC | PRN
  Administered 2016-10-02: 5 mL
  Administered 2016-10-02: 2 mL

## 2016-10-02 MED ORDER — FENTANYL CITRATE (PF) 100 MCG/2ML IJ SOLN
INTRAMUSCULAR | Status: DC | PRN
  Administered 2016-10-02: 25 ug via INTRAVENOUS

## 2016-10-02 MED ORDER — SODIUM CHLORIDE 0.9% FLUSH: 3.0000 mL | INTRAVENOUS | Status: DC | PRN

## 2016-10-02 MED ORDER — NITROGLYCERIN 1 MG/10 ML FOR IR/CATH LAB
INTRA_ARTERIAL | Status: AC
  Filled 2016-10-02: qty 10

## 2016-10-02 MED ORDER — HEPARIN SODIUM (PORCINE) 1000 UNIT/ML IJ SOLN
INTRAMUSCULAR | Status: AC
  Filled 2016-10-02: qty 1

## 2016-10-02 MED ORDER — SODIUM CHLORIDE 0.9% FLUSH
3.0000 mL | Freq: Two times a day (BID) | INTRAVENOUS | Status: DC
  Administered 2016-10-02 – 2016-10-09 (×14): 3 mL via INTRAVENOUS

## 2016-10-02 SURGICAL SUPPLY — 15 items
CATH BALLN WEDGE 5F 110CM (CATHETERS) ×4 IMPLANT
CATH EXPO 5FR ANG PIGTAIL 145 (CATHETERS) ×2 IMPLANT
CATH OPTITORQUE TIG 4.0 5F (CATHETERS) ×2 IMPLANT
COVER PRB 48X5XTLSCP FOLD TPE (BAG) ×1 IMPLANT
COVER PROBE 5X48 (BAG) ×1
DEVICE RAD COMP TR BAND LRG (VASCULAR PRODUCTS) ×2 IMPLANT
GLIDESHEATH SLEND SS 6F .021 (SHEATH) ×2 IMPLANT
GUIDEWIRE .025 260CM (WIRE) ×2 IMPLANT
GUIDEWIRE INQWIRE 1.5J.035X260 (WIRE) ×1 IMPLANT
INQWIRE 1.5J .035X260CM (WIRE) ×2
KIT HEART LEFT (KITS) ×2 IMPLANT
PACK CARDIAC CATHETERIZATION (CUSTOM PROCEDURE TRAY) ×2 IMPLANT
SHEATH GLIDE SLENDER 4/5FR (SHEATH) ×2 IMPLANT
TRANSDUCER W/STOPCOCK (MISCELLANEOUS) ×4 IMPLANT
TUBING CIL FLEX 10 FLL-RA (TUBING) ×2 IMPLANT

## 2016-10-02 NOTE — Progress Notes (Signed)
Progress Note  Patient Name: David [name] Date of Encounter: 10/02/2016  Primary Cardiologist: Dr. Diona Browner  Subjective   Feeling short of breath with ambulation.  No orthopnea or PND.  Reports that he was not taking his medication for 3 months.    Inpatient Medications    Scheduled Meds: . allopurinol  300 mg Oral Daily  . carvedilol  3.125 mg Oral BID WC  . furosemide  20 mg Oral Daily  . potassium chloride  20 mEq Oral Daily  . potassium chloride  20 mEq Oral Daily  . sodium chloride flush  3 mL Intravenous Q12H   Continuous Infusions: . sodium chloride    . sodium chloride 10 mL/hr at 10/02/16 0522  . heparin 1,250 Units/hr (10/02/16 0433)   PRN Meds: sodium chloride, sodium chloride flush   Vital Signs    Vitals:   10/01/16 1730 10/02/16 0300 10/02/16 0619 10/02/16 0816  BP: 107/73 90/60 105/81 92/74  Pulse: 95 (!) 50 97 82  Resp: 18  18 20   Temp:  98.8 F (37.1 C) 98.7 F (37.1 C) 98.3 F (36.8 C)  TempSrc:  Oral Oral Oral  SpO2: 100% 97% 100% 100%  Weight: 92.4 kg (203 lb 12.8 oz)  92.6 kg (204 lb 3.2 oz)   Height: 5\' 11"  (1.803 m)       Intake/Output Summary (Last 24 hours) at 10/02/16 0833 Last data filed at 10/02/16 0624  Gross per 24 hour  Intake           378.46 ml  Output             1325 ml  Net          -946.54 ml   Filed Weights   10/01/16 0742 10/01/16 1730 10/02/16 0619  Weight: 88.9 kg (196 lb) 92.4 kg (203 lb 12.8 oz) 92.6 kg (204 lb 3.2 oz)    Telemetry    Atrial fibrillation. PVCs. Rate 80s to 90s - Personally Reviewed  ECG    09/21/16: Intradermal fibrillation. Rate 100 bpm. F T-wave inversions.- Personally Reviewed  Physical Exam   GEN: Well-appearing.  Laying flat in bed comfortably.  No acute distress.   Neck: JVP 2cm above the clavicle at 45 degrees.  Prominent EJ. Cardiac: Irregularly irregular. No murmurs, rubs, or gallops.  Respiratory: Clear to auscultation bilaterally.  No crackles, wheezes, or  rhonchi. GI: Soft. +RUQ TTP.  +BS.  Non-distended  MS: 2+ pitting edema in bilateral feet; No deformity. Neuro:  Nonfocal  Psych: Normal affect   Labs    Chemistry Recent Labs Lab 10/01/16 0813 10/01/16 1556 10/02/16 0517  NA 135 132* 137  K 3.4* 3.7 3.7  CL 104 102 105  CO2 22 21* 23  GLUCOSE 124* 152* 120*  BUN 31* 30* 28*  CREATININE 1.51* 1.50* 1.56*  CALCIUM 8.0* 7.9* 7.9*  PROT 5.5*  --   --   ALBUMIN 2.7*  --   --   AST 38  --   --   ALT 73*  --   --   ALKPHOS 150*  --   --   BILITOT 0.7  --   --   GFRNONAA 51* 51* 49*  GFRAA 59* 59* 56*  ANIONGAP 9 9 9      Hematology Recent Labs Lab 10/01/16 0813 10/02/16 0517  WBC 9.7 9.5  RBC 4.43 4.13*  HGB 13.6 12.5*  HCT 41.4 38.6*  MCV 93.5 93.5  MCH 30.7 30.3  MCHC 32.9  32.4  RDW 13.4 13.5  PLT 264 266    Cardiac Enzymes Recent Labs Lab 10/01/16 0813 10/01/16 1016 10/01/16 1556  TROPONINI 0.50* 0.41* 0.34*   No results for input(s): TROPIPOC in the last 168 hours.   BNP Recent Labs Lab 10/01/16 0813  BNP 1,287.0*     DDimer No results for input(s): DDIMER in the last 168 hours.   Radiology    Dg Chest 2 View  Result Date: 10/01/2016 CLINICAL DATA:  SOB AND BIL LEG SWELLING X 1+ WEEKS, A-FIB, NON SMOKER EXAM: CHEST  2 VIEW COMPARISON:  Chest x-ray dated 12/05/2013. FINDINGS: Stable cardiomegaly. Overall cardiomediastinal silhouette is stable. Questionable small pleural effusion and/or mild atelectasis at the left lung base. Lungs otherwise clear. No pleural effusion or pneumothorax. No acute or suspicious osseous finding. IMPRESSION: Stable cardiomegaly. No active cardiopulmonary disease. Questionable small pleural effusion and/or atelectasis at the left lung base. No evidence of CHF or pneumonia. Electronically Signed   By: Bary Richard M.D.   On: 10/01/2016 08:47    Cardiac Studies   Echo 10/02/16: Study Conclusions  - Left ventricle: The cavity size was mildly dilated. Wall   thickness  was normal. Systolic function was severely reduced. The   estimated ejection fraction was in the range of 15% to 20%.   Diffuse hypok inesis. The study is not technically sufficient to   allow evaluation of LV diastolic function. - Aortic valve: Mildly calcified annulus. Trileaflet; mildly   thickened leaflets. There was moderate regurgitation. Valve area   (VTI): 1.73 cm^2. Valve area (Vmax): 1.41 cm^2. - Mitral valve: Mildly calcified annulus. Normal thickness leaflets   . - Left atrium: The atrium was severely dilated. - Right ventricle: The cavity size was mildly dilated. Systolic   function was mildly reduced. - Right atrium: The atrium was severely dilated. - Tricuspid valve: There was moderate regurgitation. - Pulmonary arteries: Systolic pressure was mildly to moderately   increased. PA peak pressure: 39 mm Hg (S). - Technically adequate study.   Patient Profile     56 y.o. male hypertension, long-standing-persistent atrial fibrillation, alcohol abuse, and noncompliance here with acute on chronic systolic and diastolic heart failure.  Assessment & Plan    # Acute on chronic systolic and diastolic heart failure: LVEF is reduced to 30-20% from 40-45% previously. This occurred in the setting of medication noncompliance for the last 3 months. He also reports chest pain. We will plan for left and right heart catheterization today. He still seems to be mildly volume overloaded. Renal function is stable but elevated to 1.5. His baseline creatinine is 1.3. We will not titrate his diuretics until after finding out his cardiac output and right atrial pressure. He may need inotropes given his low blood pressure and worsening renal function while attempting diuresis.  Continue carvedilol.  No ARB/ARNI given low BP and renal function.   # Longstanding-persistent atrial fibrillation: Rates are currently controlled.  Warfarin has been held for cardiac catheterization.  INR was 1.58 on  admission.  # NSTEMI:  troponin is mildly elevated. His EKG shows lateral T-wave inversions that are more prominent than 09/25/16. Left heart catheterization as above. Continue heparin drip, carvedilol, and aspirin.  We will check lipids and add atorvastatin. This may be stopped  based on cath results.  # Noncompliance: Stressed the importance of taking medications as prescribed.   Signed, Chilton Si, MD  10/02/2016, 8:33 AM

## 2016-10-02 NOTE — H&P (Signed)
Patient initially seen as consult, later determined he was to be admitted to cardiology service. Initial consult note copied over into H&P titled note per request from medical records   Dominga Ferry MD  Expand All Collapse All   [] Hide copied text   Cardiology Consultation:   Patient ID: David [name]; [id number]; [birth year]   Admit date: 10/01/2016 Date of Consult: 10/01/2016  Primary Care Provider: Salley Scarlet, MD Primary Cardiologist: Diona Browner Primary Electrophysiologist:  NA   Patient Profile:   David [name] is a 56 y.o. male with a hx of persistent atrial fib, ICM, HTN, and medical non-compliance,,  who is being seen today for the evaluation of dyspnea and elevated troponin at the request of Dr,Zammit, ER physician.  History of Present Illness:   David [name] is a 75 male recently seen by Dr. Eden Emms in the office on 09/25/2016 For ongoing assessment of atrial fibrillation, nonischemic cardiopathy with last EF 40-45% on 02/21/2016, hypertension, with history of alcohol abuse.   The patient is normally followed by Dr. Diona Browner, but he had been out of his medications for 3 months, was beginning to have issues with chest pain, and some shortness of breath, therefore his wife requested that he be seen sooner than scheduled appointment.   Dr. Eden Emms restarted him on his medications that he had been taking in the past, but increased carvedilol to 12.5 mg twice a day and increased Zestril to 10 mg daily. CHADS VASC score was 3, he was unable to afford DOAC, and therefore restarted on Coumadin with follow-up in our office Coumadin clinic. At that time he denied any chest pain or palpitations.   The patient presented to the emergency room with complaints of lower extremity edema, along with PND and worsening dyspnea on exertion. He states that walking across room causes significant dyspnea, he awakes at night time as he is unable to breathe and has to sit up, he is  also noted worsening lower extremity edema compared to last week when he was seen by Dr. Eden Emms. He denies medical noncompliance and has been taking the diuretic as prescribed, with no improvement in his symptoms. He also complains of "sticking chest pain" moving around his chest. He does state that he wasn't able to fill his potassium prescription yet.  On arrival to ER blood pressure 124/82 O2 sat 94% heart rate 101. CBC was within normal limits. He was found to be mildly hypokalemic with potassium of 3.4, glucose 124, BUN 31, creatinine 1.51. Protein and albumin were also low. BNP elevated at 1.287. Troponin elevated at 0.50. Chest x-ray revealed stable cardiomegaly, no evidence of CHF or pneumonia, questionable small pleural effusion or atelectasis in the left lung base. EKG revealed atrial fibrillation with T-wave inversion in the lateral leads compared to EKG on 09/25/2016, with no T-wave inversions noted at that time.       Past Medical History:  Diagnosis Date  . Alcohol abuse   . Arthritis    Bilateral knees   . Atrial fibrillation (HCC)   . Chronic kidney disease, stage 2, mildly decreased GFR   . GERD (gastroesophageal reflux disease)   . Gout   . Nonischemic cardiomyopathy (HCC)    LVEF 20-25% improved to 50% on medical therapy  . TIA (transient ischemic attack)          Past Surgical History:  Procedure Laterality Date  . Arthroscopic knee surgery Right 1987  . CARDIOVERSION  10/08/2011   Procedure: CARDIOVERSION;  Surgeon: Jonelle Sidle, MD;  Location: [facility];  Service: Cardiovascular;  Laterality: N/A;  Done in PACU  . CARDIOVERSION  01/10/2012   Procedure: CARDIOVERSION;  Surgeon: Marinus Maw, MD;  Location: [facility] ENDOSCOPY;  Service: Cardiovascular;  Laterality: N/A;  . CARDIOVERSION N/A 11/17/2014   Procedure: CARDIOVERSION;  Surgeon: Laqueta Linden, MD;  Location: [facility] ORS;  Service: Cardiovascular;  Laterality: N/A;  . TEE WITHOUT CARDIOVERSION  N/A 11/17/2014   Procedure: TRANSESOPHAGEAL ECHOCARDIOGRAM (TEE);  Surgeon: Laqueta Linden, MD;  Location: [facility];  Service: Cardiovascular;  Laterality: N/A;  . TOTAL KNEE ARTHROPLASTY Left 01/16/2013   Procedure: TOTAL KNEE ARTHROPLASTY;  Surgeon: Vickki Hearing, MD;  Location: [facility];  Service: Orthopedics;  Laterality: Left;     Inpatient Medications: Scheduled Meds:  Continuous Infusions:  PRN Meds:   Allergies:        Allergies  Allergen Reactions  . Azithromycin Itching    Social History:   Social History        Social History  . Marital status: Married    Spouse name: N/A  . Number of children: N/A  . Years of education: N/A      Occupational History  . Not on file.         Social History Main Topics  . Smoking status: Never Smoker  . Smokeless tobacco: Former Neurosurgeon    Types: Snuff     Comment: quit 1980's  . Alcohol use 0.6 oz/week    1 Cans of beer per week     Comment: last beer 01/12/2013 but sts i am quitting  . Drug use: No     Comment: last time 2 years ago  . Sexual activity: Yes    Partners: Male    Birth control/ protection: None       Other Topics Concern  . Not on file      Social History Narrative   Lives in [place] with his sister   Carmela Hurt off from [employer].    Family History:   The patient's family history includes Hypertension in his brother, mother, and sister.  ROS:  Please see the history of present illness.  ROS  All other ROS reviewed and negative.     Physical Exam/Data:         Vitals:   10/01/16 0743 10/01/16 0800 10/01/16 0830 10/01/16 0900  BP: 99/74 101/87 98/80 96/82   Pulse: 93 93 93   Resp: (!) 22 14 19  (!) 21  Temp: 97.6 F (36.4 C)     TempSrc: Oral     SpO2:  100% 100%   Weight:      Height:       No intake or output data in the 24 hours ending 10/01/16 0918    Filed Weights   10/01/16 0742  Weight: 196 lb (88.9 kg)    Body mass index is 26.58 kg/m.  General:  Well nourished, well developed, in no acute distress.  HEENT: normal Lymph: no adenopathy Neck: no JVD Endocrine:  No thryomegaly Vascular: No carotid bruits; FA pulses 2+ bilaterally without bruits  Cardiac:  IRRR; no murmur  Lungs:  clear to auscultation bilaterally, no wheezing, rhonchi or rales  Abd: soft, nontender, no hepatomegaly  Ext: no edema Musculoskeletal:  No deformities, BUE and BLE strength normal and equal, 1+ to 2+ pitting team a bilateral lower extremities pretibially, right greater than left. Well-healed scar on the left knee from prior surgery. Skin: warm and dry  Neuro:  CNs 2-12 intact, no focal abnormalities noted Psych:  Normal affect   EKG:  The EKG was personally reviewed and demonstrates: Atrial fibrillation with T-wave inversions laterally compared to prior EKG on 09/25/2016   Telemetry:  Telemetry was personally reviewed and demonstrates: Atrial fibrillation.  Relevant CV Studies: Echocardiogram 02/21/2016 Left ventricle: Diffuse hypokinesis worse in the septum and apex. The cavity size was moderately dilated. Wall thickness was increased in a pattern of mild LVH. Systolic function was mildly to moderately reduced. The estimated ejection fraction was in the range of 40% to 45%. The study is not technically sufficient to allow evaluation of LV diastolic function. - Aortic valve: There was mild regurgitation. - Atrial septum: No defect or patent foramen ovale was identified. - Impressions: EF has decreased since echo done 2016 but better than 2013 when it was 20-25%  Impressions: EF has decreased since echo done 2016 but better than 2013 when it was 20-25%  Stress MPI: 06/19/2011 IMPRESSION: Abnormal Lexiscan Myoview as outlined. The patient remained in atrial fibrillation throughout, had no diagnostic ST-segment changes, and reported no chest pain. He was hemodynamically stable.  Perfusion imaging shows diaphragmatic attenuation with probable soft tissue attenuation versus scar affecting the inferior wall, however no clear ischemia. LVEF is reduced to 22% with diffuse hypokinesis and dilated ventricle. Overall pattern could be consistent with nonischemic cardiomyopathy.  Laboratory Data:  Chemistry Last Labs    Recent Labs Lab 10/01/16 0813  NA 135  K 3.4*  CL 104  CO2 22  GLUCOSE 124*  BUN 31*  CREATININE 1.51*  CALCIUM 8.0*  GFRNONAA 51*  GFRAA 59*  ANIONGAP 9       Last Labs    Recent Labs Lab 10/01/16 0813  PROT 5.5*  ALBUMIN 2.7*  AST 38  ALT 73*  ALKPHOS 150*  BILITOT 0.7     Hematology Last Labs    Recent Labs Lab 10/01/16 0813  WBC 9.7  RBC 4.43  HGB 13.6  HCT 41.4  MCV 93.5  MCH 30.7  MCHC 32.9  RDW 13.4  PLT 264     Cardiac Enzymes Last Labs    Recent Labs Lab 10/01/16 0813  TROPONINI 0.50*      Last Labs   No results for input(s): TROPIPOC in the last 168 hours.    BNP Last Labs    Recent Labs Lab 10/01/16 0813  BNP 1,287.0*      DDimer  Last Labs   No results for input(s): DDIMER in the last 168 hours.    Radiology/Studies:  Dg Chest 2 View  Result Date: 10/01/2016 CLINICAL DATA:  SOB AND BIL LEG SWELLING X 1+ WEEKS, A-FIB, NON SMOKER EXAM: CHEST  2 VIEW COMPARISON:  Chest x-ray dated 12/05/2013. FINDINGS: Stable cardiomegaly. Overall cardiomediastinal silhouette is stable. Questionable small pleural effusion and/or mild atelectasis at the left lung base. Lungs otherwise clear. No pleural effusion or pneumothorax. No acute or suspicious osseous finding. IMPRESSION: Stable cardiomegaly. No active cardiopulmonary disease. Questionable small pleural effusion and/or atelectasis at the left lung base. No evidence of CHF or pneumonia. Electronically Signed   By: Bary Richard M.D.   On: 10/01/2016 08:47    Assessment and Plan:   1. Dyspnea: Worsening symptoms over the last week. He  saw Dr. Eden Emms on 09/25/2016 and was restarted on all medications that he had been taking in the past but had not taken for approximate 3 months. This did include new Rx for Coumadin as he was  unable to afford DOAC.   The patient states she was taking his medications as directed, and getting diuresis from return to Lasix 20 mg daily. However he began to have worsening dyspnea on exertion, waking up at night flushed, feeling hot, with symptoms of PND and orthopnea. He also states that walking across a room has caused him to have worsening dyspnea. Chest x-ray does not reveal decompensated CHF, he does have elevated BNP. He states his weight was 10 pounds higher in our office compared to prior weight of 186 pounds (weight in our office 196 pounds).    2.  Elevated troponin: Troponin level 0.050 on first left. He does have changes in his EKG with T-wave inversion laterally which are new compared to prior EKG in our office one week ago. Would continue to cycle enzymes. With symptoms of worsening dyspnea and "sticking" chest pain along with fatigue, may need to consider ischemic testing with cardiac catheterization. We'll discuss this further with Dr. Wyline Mood, he is currently not having any complaints of chest pain at this time.  3. History of nonischemic cardiopathy: Most recent echocardiogram was in 2017 with improvement in LV systolic function to 40% to 45%, compared to prior studies with EF of 20-25%. We'll repeat echocardiogram for changes in LV function to assist with medical management.  4. Atrial fibrillation: Rate is currently Mildly elevated, 100s to 90s. He has not yet taken his medications today. He is recently been started on Coumadin. PT 19.1 INR 1.58. Continue Coumadin therapy per pharmacy unless catheterization is planned. Continue carvedilol.  5. Worsening lower extremity edema: Patient has been taking Lasix 20 mg as directed. He states his edema has worsened. We'll give one dose of IV  Lasix, potassium replacement.   6. History of chronic kidney disease stage II: Creatinine on admission 1.51, compared to prior creatinine in August 2016 at 1.37.  Signed, Joni Reining DNP, ANP.  10/01/2016 9:18 AM   Patient seen and discussed with DNP Lyman Bishop, I agree with her documentation above. 56 yo male history of afib, EtOH abuse, CKD, prior TIA, chronic systolic HF with LVEF as low as 20-25% but most recent LVEF 40-45%. Notes mention a history of NICM that looks to be based on a Nuclear stress 06/2011 inferior scar vs attenuation, no clear ischemia. Cr around 1.7 at the time of stress, looks like with noninvasive results and poor renal function did not have cath.   Presents with progressing SOB and LE edema. Also for 1 month intermittent sharp left sided chest pain into left shoulder, 7/10, worst with positioning. Recent poor medication compliance.    Hgb 13.6, Plt 264, K 3.4, Cr 1.51, BNP 1287, INR 1.6 CXR no acute process EKG afib, chronic lateral precordial ST depressions/TWI more prominent.  Acute on chronic systolic HF in setting of recent medication noncompliance. Worst swelling he reports in years, I am worried about worsening of his LV function. Will require admission for IV diuresis. Trop of 0.50 in setting of CHF and atypical chest pain. We will repeat troponin in ER and also obtain echo in ER, if significant changes would transfer to [facility]. If stable or trending down trop and stable echo, would consider admission here for initial medical therapy. Start heparin gtt, hold coumadin at this time in case invasive testing is indicated. ER dispo pending f/u troponin and echo.    Dominga Ferry MD  Addendum 140pm Repeat troponin trending down. Echo however shows drop in LVEF to 15-20%. He will require transfer  to [facility], will need RHC/LHC pending renal function and once more euvolemic. Some soft bp's in ER, we have downtitrated his coreg to 3.125mg  bid and held  lisinopril, adjust as tolerated this admission.   Dominga Ferry MD

## 2016-10-02 NOTE — Progress Notes (Signed)
Pharmacist Heart Failure Core Measure Documentation  Assessment: David [name] has an EF documented as 15-20% on 6/25 by Echo.  Rationale: Heart failure patients with left ventricular systolic dysfunction (LVSD) and an EF < 40% should be prescribed an angiotensin converting enzyme inhibitor (ACEI) or angiotensin receptor blocker (ARB) at discharge unless a contraindication is documented in the medical record.  This patient is not currently on an ACEI or ARB for HF.  This note is being placed in the record in order to provide documentation that a contraindication to the use of these agents is present for this encounter.  ACE Inhibitor or Angiotensin Receptor Blocker is contraindicated (specify all that apply)  []   ACEI allergy AND ARB allergy []   Angioedema []   Moderate or severe aortic stenosis []   Hyperkalemia [x]   Hypotension []   Renal artery stenosis [x]   Worsening renal function, preexisting renal disease or dysfunction   Riki Rusk 10/02/2016 10:14 AM

## 2016-10-02 NOTE — Progress Notes (Signed)
ANTICOAGULATION CONSULT NOTE  Pharmacy Consult for Heparin Indication: ACS / afib  Allergies  Allergen Reactions  . Azithromycin Itching   Patient Measurements: Height: 5\' 11"  (180.3 cm) Weight: 204 lb 3.2 oz (92.6 kg) IBW/kg (Calculated) : 75.3 HEPARIN DW (KG): 92.4  Vital Signs: Temp: 98.3 F (36.8 C) (06/26 0816) Temp Source: Oral (06/26 0816) BP: 97/71 (06/26 1500) Pulse Rate: 95 (06/26 1500)  Labs:  Recent Labs  10/01/16 0813 10/01/16 0823 10/01/16 1016 10/01/16 1556 10/02/16 0347 10/02/16 0517  HGB 13.6  --   --   --   --  12.5*  HCT 41.4  --   --   --   --  38.6*  PLT 264  --   --   --   --  266  LABPROT  --  19.0*  --  20.0* 19.3*  --   INR  --  1.58  --  1.69 1.61  --   HEPARINUNFRC  --   --   --  0.51 0.26*  --   CREATININE 1.51*  --   --  1.50*  --  1.56*  TROPONINI 0.50*  --  0.41* 0.34*  --   --    Estimated Creatinine Clearance: 62.9 mL/min (A) (by C-G formula based on SCr of 1.56 mg/dL (H)).  Assessment: 56 YOM on warfarin PTA, currently on IV heparin for ACS. S/p cath today, and is to restart heparin 12h post sheath removal. Cath note documents two sheaths removed- one at 1350 and the other at 1355.  This morning's heparin level was low at 0.26units/mL on 1250 units/hr- rate was increased prior to cath without level obtained pre-cath. No excessive bleeding noted.  Goal of Therapy:  Heparin level 0.3-0.7 units/ml Monitor platelets by anticoagulation protocol: Yes     Plan:  Restart heparin at 1350 unit/hr at 0200 on 6/27 6h heparin level after restart Daily heparin level and CBC Follow surgical and cardiology plans  Shyheem Whitham D. Tekla Malachowski, PharmD, BCPS Clinical Pharmacist Pager: [phone] 10/02/2016 3:06 PM

## 2016-10-02 NOTE — Progress Notes (Signed)
ANTICOAGULATION CONSULT NOTE - Initial Consult  Pharmacy Consult for Heparin / Warfarin Indication: ACS / afib / resume home med / warfarin  Allergies  Allergen Reactions  . Azithromycin Itching   Patient Measurements: Height: 5\' 11"  (180.3 cm) Weight: 204 lb 3.2 oz (92.6 kg) IBW/kg (Calculated) : 75.3 HEPARIN DW (KG): 92.4  Vital Signs: Temp: 98.3 F (36.8 C) (06/26 0816) Temp Source: Oral (06/26 0816) BP: 92/74 (06/26 0816) Pulse Rate: 82 (06/26 0816)  Labs:  Recent Labs  10/01/16 0813 10/01/16 0823 10/01/16 1016 10/01/16 1556 10/02/16 0347 10/02/16 0517  HGB 13.6  --   --   --   --  12.5*  HCT 41.4  --   --   --   --  38.6*  PLT 264  --   --   --   --  266  LABPROT  --  19.0*  --  20.0* 19.3*  --   INR  --  1.58  --  1.69 1.61  --   HEPARINUNFRC  --   --   --  0.51 0.26*  --   CREATININE 1.51*  --   --  1.50*  --  1.56*  TROPONINI 0.50*  --  0.41* 0.34*  --   --    Estimated Creatinine Clearance: 62.9 mL/min (A) (by C-G formula based on SCr of 1.56 mg/dL (H)).  Medical History: Past Medical History:  Diagnosis Date  . Alcohol abuse   . Arthritis    Bilateral knees   . Atrial fibrillation (HCC)   . Chronic kidney disease, stage 2, mildly decreased GFR   . GERD (gastroesophageal reflux disease)   . Gout   . Nonischemic cardiomyopathy (HCC)    LVEF 20-25% improved to 50% on medical therapy  . TIA (transient ischemic attack)    Medications:  Prescriptions Prior to Admission  Medication Sig Dispense Refill Last Dose  . allopurinol (ZYLOPRIM) 300 MG tablet Take 1 tablet (300 mg total) by mouth daily. 30 tablet 5 09/30/2016 at Unknown time  . carvedilol (COREG) 12.5 MG tablet Take 1 tablet (12.5 mg total) by mouth 2 (two) times daily. 60 tablet 3 09/30/2016 at 2030  . furosemide (LASIX) 40 MG tablet Take 0.5 tablets (20 mg total) by mouth daily. 30 tablet 6 09/30/2016 at Unknown time  . potassium chloride (KLOR-CON M10) 10 MEQ tablet TAKE ONE TABLET BY MOUTH  ONCE DAILY IN THE MORNING 30 tablet 6 unknown    Assessment: Pt on chronic Coumadin PTA.  Now on IV heparin for ACS, plan for cath at around noon today. Heparin level 0.26, slightly subtherapeutic on 1250 units/hr, INR 1.61, CBC stable, No bleeding noted per chart.  Goal of Therapy:  Heparin level 0.3-0.7 units/ml Monitor platelets by anticoagulation protocol: Yes     Plan:  Increase heparin rate to 1350 unit/hr F/u plans after cath F/U restart warfarin  Bayard Hugger, PharmD, BCPS  Clinical Pharmacist  Pager: [phone]   10/02/2016 8:35 AM

## 2016-10-02 NOTE — Progress Notes (Addendum)
After talking to cath lab nurse patient verbalized understanding. Requested to sign consent. Consent signed and placed in the chart.  Will continue to monitor.   Niamya Vittitow, RN

## 2016-10-03 ENCOUNTER — Encounter (HOSPITAL_COMMUNITY): Payer: Self-pay | Admitting: Cardiovascular Disease

## 2016-10-03 DIAGNOSIS — I482 Chronic atrial fibrillation: Secondary | ICD-10-CM

## 2016-10-03 DIAGNOSIS — I5043 Acute on chronic combined systolic (congestive) and diastolic (congestive) heart failure: Secondary | ICD-10-CM

## 2016-10-03 DIAGNOSIS — I248 Other forms of acute ischemic heart disease: Secondary | ICD-10-CM

## 2016-10-03 LAB — PROTIME-INR
INR: 1.33
Prothrombin Time: 16.6 seconds — ABNORMAL HIGH (ref 11.4–15.2)

## 2016-10-03 LAB — CBC
HCT: 41.4 % (ref 39.0–52.0)
HCT: 41.4 % (ref 39.0–52.0)
Hemoglobin: 12.8 g/dL — ABNORMAL LOW (ref 13.0–17.0)
Hemoglobin: 13.3 g/dL (ref 13.0–17.0)
MCH: 29.8 pg (ref 26.0–34.0)
MCH: 30.6 pg (ref 26.0–34.0)
MCHC: 30.9 g/dL (ref 30.0–36.0)
MCHC: 32.1 g/dL (ref 30.0–36.0)
MCV: 96.5 fL (ref 78.0–100.0)
MCV: 95.2 fL (ref 78.0–100.0)
Platelets: 296 10*3/uL (ref 150–400)
Platelets: 274 10*3/uL (ref 150–400)
RBC: 4.29 MIL/uL (ref 4.22–5.81)
RBC: 4.35 MIL/uL (ref 4.22–5.81)
RDW: 13.9 % (ref 11.5–15.5)
RDW: 13.6 % (ref 11.5–15.5)
WBC: 8.2 10*3/uL (ref 4.0–10.5)
WBC: 8.9 10*3/uL (ref 4.0–10.5)

## 2016-10-03 LAB — LIPID PANEL
Cholesterol: 104 mg/dL (ref 0–200)
HDL: 30 mg/dL — ABNORMAL LOW (ref >40)
LDL Cholesterol: 67 mg/dL (ref 0–99)
Total CHOL/HDL Ratio: 3.5 RATIO
Triglycerides: 34 mg/dL (ref <150)
VLDL: 7 mg/dL (ref 0–40)

## 2016-10-03 LAB — BASIC METABOLIC PANEL
Anion gap: 4 — ABNORMAL LOW (ref 5–15)
BUN: 25 mg/dL — ABNORMAL HIGH (ref 6–20)
CO2: 25 mmol/L (ref 22–32)
Calcium: 7.9 mg/dL — ABNORMAL LOW (ref 8.9–10.3)
Chloride: 108 mmol/L (ref 101–111)
Creatinine, Ser: 1.36 mg/dL — ABNORMAL HIGH (ref 0.61–1.24)
GFR calc Af Amer: >60 mL/min (ref >60)
GFR calc non Af Amer: 58 mL/min — ABNORMAL LOW (ref >60)
Glucose, Bld: 110 mg/dL — ABNORMAL HIGH (ref 65–99)
Potassium: 4.1 mmol/L (ref 3.5–5.1)
Sodium: 137 mmol/L (ref 135–145)

## 2016-10-03 LAB — HEPARIN LEVEL (UNFRACTIONATED)
Heparin Unfractionated: 0.26 IU/mL — ABNORMAL LOW (ref 0.30–0.70)
Heparin Unfractionated: 0.43 IU/mL (ref 0.30–0.70)

## 2016-10-03 MED ORDER — METOPROLOL TARTRATE 12.5 MG HALF TABLET
12.5000 mg | ORAL_TABLET | Freq: Two times a day (BID) | ORAL | Status: DC
  Administered 2016-10-03 – 2016-10-05 (×5): 12.5 mg via ORAL
  Filled 2016-10-03 (×5): qty 1

## 2016-10-03 MED ORDER — WARFARIN SODIUM 10 MG PO TABS
10.0000 mg | ORAL_TABLET | Freq: Once | ORAL | Status: AC
  Administered 2016-10-03: 10 mg via ORAL
  Filled 2016-10-03: qty 1

## 2016-10-03 MED ORDER — FUROSEMIDE 10 MG/ML IJ SOLN
40.0000 mg | Freq: Two times a day (BID) | INTRAMUSCULAR | Status: DC
  Administered 2016-10-03 – 2016-10-05 (×4): 40 mg via INTRAVENOUS
  Filled 2016-10-03 (×4): qty 4

## 2016-10-03 MED ORDER — WARFARIN - PHARMACIST DOSING INPATIENT
Freq: Every day | Status: DC
  Administered 2016-10-03 – 2016-10-05 (×3)

## 2016-10-03 MED ORDER — ISOSORBIDE MONONITRATE ER 30 MG PO TB24
30.0000 mg | ORAL_TABLET | Freq: Every day | ORAL | Status: AC
Start: 2016-10-03 — End: 2016-10-03
  Administered 2016-10-03: 30 mg via ORAL
  Filled 2016-10-03: qty 1

## 2016-10-03 MED FILL — Heparin Sodium (Porcine) 2 Unit/ML in Sodium Chloride 0.9%: INTRAMUSCULAR | Qty: 500 | Status: AC

## 2016-10-03 NOTE — Progress Notes (Signed)
Progress Note  Patient Name: David [name] Date of Encounter: 10/03/2016  Primary Cardiologist: Dr. Diona Browner  Subjective   Feeling short of breath with ambulation.  + orthopnea or PND.     Inpatient Medications    Scheduled Meds: . allopurinol  300 mg Oral Daily  . aspirin  81 mg Oral Daily  . atorvastatin  80 mg Oral q1800  . carvedilol  3.125 mg Oral BID WC  . furosemide  40 mg Intravenous BID  . potassium chloride  20 mEq Oral Daily  . potassium chloride  20 mEq Oral Daily  . sodium chloride flush  3 mL Intravenous Q12H   Continuous Infusions: . sodium chloride    . heparin 1,500 Units/hr (10/03/16 0901)   PRN Meds: sodium chloride, acetaminophen, diazepam, ondansetron (ZOFRAN) IV, sodium chloride flush   Vital Signs    Vitals:   10/03/16 0326 10/03/16 0400 10/03/16 0500 10/03/16 0844  BP: 103/84 (!) 88/69 (!) 119/93 (!) 120/107  Pulse: (!) 101   (!) 113  Resp: (!) 35 20 (!) 27 (!) 27  Temp: 97.9 F (36.6 C)   99 F (37.2 C)  TempSrc: Oral   Oral  SpO2: 100%     Weight:      Height:        Intake/Output Summary (Last 24 hours) at 10/03/16 1042 Last data filed at 10/03/16 0600  Gross per 24 hour  Intake           591.65 ml  Output              650 ml  Net           -58.35 ml   Filed Weights   10/01/16 0742 10/01/16 1730 10/02/16 0619  Weight: 88.9 kg (196 lb) 92.4 kg (203 lb 12.8 oz) 92.6 kg (204 lb 3.2 oz)    Telemetry    Atrial fibrillation. PVCs. Rate 80s to 90s - Personally Reviewed  ECG    09/21/16: Intradermal fibrillation. Rate 100 bpm. F T-wave inversions.- Personally Reviewed  Physical Exam   GEN: Well-appearing. No acute distress.   Neck: JVP 2cm above the clavicle at 45 degrees.  Prominent EJ.  No carotid bruits Cardiac: RRR, no murmurs, rubs, or gallops.  Respiratory: Bibasilar crackles.  Diminished at L base. No wheezes or rhonchi.  GI: Soft, nontender, non-distended  MS: No deformity. Neuro:  Nonfocal  Psych: Normal  affect  Ext: WWP.  2+ pitting edema in bilateral feet; 2+ radial, DP, PT pulses   Labs    Chemistry  Recent Labs Lab 10/01/16 0813 10/01/16 1556 10/02/16 0517 10/03/16 0249  NA 135 132* 137 137  K 3.4* 3.7 3.7 4.1  CL 104 102 105 108  CO2 22 21* 23 25  GLUCOSE 124* 152* 120* 110*  BUN 31* 30* 28* 25*  CREATININE 1.51* 1.50* 1.56* 1.36*  CALCIUM 8.0* 7.9* 7.9* 7.9*  PROT 5.5*  --   --   --   ALBUMIN 2.7*  --   --   --   AST 38  --   --   --   ALT 73*  --   --   --   ALKPHOS 150*  --   --   --   BILITOT 0.7  --   --   --   GFRNONAA 51* 51* 49* 58*  GFRAA 59* 59* 56* >60  ANIONGAP 9 9 9  4*     Hematology  Recent Labs Lab 10/02/16  4709 10/03/16 0249 10/03/16 0744  WBC 9.5 8.2 8.9  RBC 4.13* 4.29 4.35  HGB 12.5* 12.8* 13.3  HCT 38.6* 41.4 41.4  MCV 93.5 96.5 95.2  MCH 30.3 29.8 30.6  MCHC 32.4 30.9 32.1  RDW 13.5 13.9 13.6  PLT 266 296 274    Cardiac Enzymes  Recent Labs Lab 10/01/16 0813 10/01/16 1016 10/01/16 1556  TROPONINI 0.50* 0.41* 0.34*   No results for input(s): TROPIPOC in the last 168 hours.   BNP  Recent Labs Lab 10/01/16 0813  BNP 1,287.0*     DDimer No results for input(s): DDIMER in the last 168 hours.   Radiology    No results found.  Cardiac Studies   Echo 10/02/16: Study Conclusions  - Left ventricle: The cavity size was mildly dilated. Wall   thickness was normal. Systolic function was severely reduced. The   estimated ejection fraction was in the range of 15% to 20%.   Diffuse hypok inesis. The study is not technically sufficient to   allow evaluation of LV diastolic function. - Aortic valve: Mildly calcified annulus. Trileaflet; mildly   thickened leaflets. There was moderate regurgitation. Valve area   (VTI): 1.73 cm^2. Valve area (Vmax): 1.41 cm^2. - Mitral valve: Mildly calcified annulus. Normal thickness leaflets   . - Left atrium: The atrium was severely dilated. - Right ventricle: The cavity size was  mildly dilated. Systolic   function was mildly reduced. - Right atrium: The atrium was severely dilated. - Tricuspid valve: There was moderate regurgitation. - Pulmonary arteries: Systolic pressure was mildly to moderately   increased. PA peak pressure: 39 mm Hg (S). - Technically adequate study.  RHC 10/02/16: RA: A 20; v 21; mean 19 RV: 50/17 PA: 49/27; mean 37 PW: a 27;v 32; mean 26  LV: 93/17/27 AO: 100/69   Oxygen saturation in the PA 42% AO 99% with the patient on supplemental oxygen. By the [facility] method cardiac output was 2.9 L/m and cardiac index was 1.4 L/m/m.  LHC 10/02/16: normal coronary arteries.  Patient Profile     56 y.o. male hypertension, long-standing-persistent atrial fibrillation, alcohol abuse, and noncompliance here with acute on chronic systolic and diastolic heart failure.  Assessment & Plan    # Acute on chronic systolic and diastolic heart failure: LVEF is reduced to 30-20% from 40-45% previously. This occurred in the setting of medication noncompliance for the last 3 months. He also reports chest pain. LHC showed no CAD.  Right and left sided pressures are elevated.  We will start lasix 40 mg iv bid.  Switch carvedilol to metoprolol for improved HR control.  No ARB/ARNI today given renal function. Will given Imdur 30 mg today for afterload reduction.  If renal function improves tomorrow, will switch this to losartan.  He will need an echo in 3 months.  If his LVEF remains low he will need an ICD.  # Longstanding-persistent atrial fibrillation: Rates are poorly-controlled.  Switch carvedilol to metoprolol as above.  Warfarin was held for cardiac catheterization.  Resume warfarin and continue heparin.  INR was 1.58 on admission.  # Demand ischemia:  Troponin is mildly elevated. His EKG shows lateral T-wave inversions that are more prominent than 09/25/16. Left heart catheterization showed no CAD.  Stop aspirin given that he is on warfarin.  LDL was 67 on  admission.  Atorvastatin was stopped this admission.  We will stop this medication.  # Noncompliance: Stressed the importance of taking medications as prescribed.   Signed, Levester Waldridge  Duke Salvia, MD  10/03/2016, 10:42 AM

## 2016-10-03 NOTE — Progress Notes (Signed)
ANTICOAGULATION CONSULT NOTE  Pharmacy Consult for Heparin Indication: ACS / afib  Allergies  Allergen Reactions  . Azithromycin Itching   Patient Measurements: Height: 5\' 11"  (180.3 cm) Weight: 204 lb 3.2 oz (92.6 kg) IBW/kg (Calculated) : 75.3 HEPARIN DW (KG): 92.4  Vital Signs: Temp: 98.5 F (36.9 C) (06/27 1746) Temp Source: Oral (06/27 1746) BP: 118/49 (06/27 1746) Pulse Rate: 100 (06/27 1746)  Labs:  Recent Labs  10/01/16 0813  10/01/16 1016  10/01/16 1556 10/02/16 0347 10/02/16 0517 10/02/16 1625 10/03/16 0249 10/03/16 0744 10/03/16 1535  HGB 13.6  --   --   --   --   --  12.5*  --  12.8* 13.3  --   HCT 41.4  --   --   --   --   --  38.6*  --  41.4 41.4  --   PLT 264  --   --   --   --   --  266  --  296 274  --   LABPROT  --   < >  --   --  20.0* 19.3*  --   --  16.6*  --   --   INR  --   < >  --   --  1.69 1.61  --   --  1.33  --   --   HEPARINUNFRC  --   --   --   < > 0.51 0.26*  --  0.28*  --  0.26* 0.43  CREATININE 1.51*  --   --   --  1.50*  --  1.56*  --  1.36*  --   --   TROPONINI 0.50*  --  0.41*  --  0.34*  --   --   --   --   --   --   < > = values in this interval not displayed. Estimated Creatinine Clearance: 72.2 mL/min (A) (by C-G formula based on SCr of 1.36 mg/dL (H)).  Assessment: 56 YOM on warfarin PTA, currently on IV heparin for ACS and AFib s/p cath transitioning back to warfarin.  Heparin level this afternoon is therapeutic at 0.43 units/mL after a rate increase earlier today. No bleeding noted.  Goal of Therapy:  Heparin level 0.3-0.7 units/ml Monitor platelets by anticoagulation protocol: Yes     Plan:  Continue heparin at 1500 units/hr Daily heparin level and CBC  Taunja Brickner D. Gillis Boardley, PharmD, BCPS Clinical Pharmacist [phone] 10/03/2016 5:50 PM

## 2016-10-03 NOTE — Care Management Note (Signed)
Case Management Note  Patient Details  Name: David [name] MRN: [id number] Date of Birth: 07-17-1960  Subjective/Objective:       Pt admitted with SOB             Action/Plan:   PTA independent from home with wife.  Pt states he has PCP and active medical/prescription insurance.  Pt states "before I was unable to buy all of my medications because they added up to over $100".  However, pt now denies barriers to paying copays for prescribed medications.  CM will continue to follow for discharge needs   Expected Discharge Date:  10/03/16               Expected Discharge Plan:  Home/Self Care  In-House Referral:     Discharge planning Services  CM Consult  Post Acute Care Choice:    Choice offered to:     DME Arranged:    DME Agency:     HH Arranged:    HH Agency:     Status of Service:     If discussed at [employer], dates discussed:    Additional Comments:  Cherylann Parr, RN 10/03/2016, 4:03 PM

## 2016-10-03 NOTE — Progress Notes (Addendum)
ANTICOAGULATION CONSULT NOTE  Pharmacy Consult for Heparin Indication: ACS / afib  Allergies  Allergen Reactions  . Azithromycin Itching   Patient Measurements: Height: 5\' 11"  (180.3 cm) Weight: 204 lb 3.2 oz (92.6 kg) IBW/kg (Calculated) : 75.3 HEPARIN DW (KG): 92.4  Vital Signs: Temp: 99 F (37.2 C) (06/27 0844) Temp Source: Oral (06/27 0844) BP: 120/107 (06/27 0844) Pulse Rate: 113 (06/27 0844)  Labs:  Recent Labs  10/01/16 0813  10/01/16 1016  10/01/16 1556 10/02/16 0347 10/02/16 0517 10/02/16 1625 10/03/16 0249 10/03/16 0744  HGB 13.6  --   --   --   --   --  12.5*  --  12.8* 13.3  HCT 41.4  --   --   --   --   --  38.6*  --  41.4 41.4  PLT 264  --   --   --   --   --  266  --  296 274  LABPROT  --   < >  --   --  20.0* 19.3*  --   --  16.6*  --   INR  --   < >  --   --  1.69 1.61  --   --  1.33  --   HEPARINUNFRC  --   --   --   < > 0.51 0.26*  --  0.28*  --  0.26*  CREATININE 1.51*  --   --   --  1.50*  --  1.56*  --  1.36*  --   TROPONINI 0.50*  --  0.41*  --  0.34*  --   --   --   --   --   < > = values in this interval not displayed. Estimated Creatinine Clearance: 72.2 mL/min (A) (by C-G formula based on SCr of 1.36 mg/dL (H)).  Assessment: 56 YOM on warfarin PTA for afib, currently on IV heparin for ACS. S/p cath 6/26 and heparin resumed 12h post-sheath removal.  Heparin level slightly subtherapeutic at 0.26. CBC stable. No bleed or IV line issues per RN but notes drip was not started until 0400 post-op due to drainage from sheath - now resolved.  Goal of Therapy:  Heparin level 0.3-0.7 units/ml Monitor platelets by anticoagulation protocol: Yes     Plan:  Increase heparin to 1500 units/hr 6h heparin level Daily heparin level and CBC Monitor for s/sx bleeding Follow surgical and cardiology plans - warfarin on hold   Babs Bertin, PharmD, BCPS Clinical Pharmacist Rx Phone # for today: [phone] After 3:30PM, please call Main Rx:  [phone] 10/03/2016 8:57 AM    ADDENDUM:  Pharmacy consulted to resume warfarin 6/27. INR down to 1.33 after held for cath.  PTA dose: 5mg  daily except 7.5mg  on Mon/Thurs (from last outpatient anticoagulation note 6/19)   Plan: Heparin at 1500 units/h while subtherapeutic Warfarin 10mg  PO x 1 Daily heparin level/INR/CBC Monitor for s/sx bleeding   Babs Bertin, PharmD, BCPS Clinical Pharmacist Rx Phone # for today: [phone] After 3:30PM, please call Main Rx: #[id number] 10/03/2016 11:18 AM

## 2016-10-04 LAB — BASIC METABOLIC PANEL
Anion gap: 3 — ABNORMAL LOW (ref 5–15)
BUN: 23 mg/dL — ABNORMAL HIGH (ref 6–20)
CO2: 29 mmol/L (ref 22–32)
Calcium: 8.4 mg/dL — ABNORMAL LOW (ref 8.9–10.3)
Chloride: 103 mmol/L (ref 101–111)
Creatinine, Ser: 1.45 mg/dL — ABNORMAL HIGH (ref 0.61–1.24)
GFR calc Af Amer: >60 mL/min (ref >60)
GFR calc non Af Amer: 53 mL/min — ABNORMAL LOW (ref >60)
Glucose, Bld: 159 mg/dL — ABNORMAL HIGH (ref 65–99)
Potassium: 4.4 mmol/L (ref 3.5–5.1)
Sodium: 135 mmol/L (ref 135–145)

## 2016-10-04 LAB — CBC
HCT: 38.4 % — ABNORMAL LOW (ref 39.0–52.0)
Hemoglobin: 12 g/dL — ABNORMAL LOW (ref 13.0–17.0)
MCH: 29.7 pg (ref 26.0–34.0)
MCHC: 31.3 g/dL (ref 30.0–36.0)
MCV: 95 fL (ref 78.0–100.0)
Platelets: 269 10*3/uL (ref 150–400)
RBC: 4.04 MIL/uL — ABNORMAL LOW (ref 4.22–5.81)
RDW: 13.6 % (ref 11.5–15.5)
WBC: 8 10*3/uL (ref 4.0–10.5)

## 2016-10-04 LAB — PROTIME-INR
INR: 1.38
Prothrombin Time: 17.1 seconds — ABNORMAL HIGH (ref 11.4–15.2)

## 2016-10-04 LAB — HEPARIN LEVEL (UNFRACTIONATED): Heparin Unfractionated: 0.51 IU/mL (ref 0.30–0.70)

## 2016-10-04 LAB — MRSA PCR SCREENING: MRSA by PCR: NEGATIVE

## 2016-10-04 MED ORDER — WARFARIN SODIUM 10 MG PO TABS
10.0000 mg | ORAL_TABLET | Freq: Once | ORAL | Status: AC
  Administered 2016-10-04: 10 mg via ORAL
  Filled 2016-10-04: qty 1

## 2016-10-04 MED ORDER — POTASSIUM CHLORIDE CRYS ER 20 MEQ PO TBCR
40.0000 meq | EXTENDED_RELEASE_TABLET | Freq: Every day | ORAL | Status: DC
  Administered 2016-10-05 – 2016-10-09 (×5): 40 meq via ORAL
  Filled 2016-10-04 (×5): qty 2

## 2016-10-04 NOTE — Progress Notes (Signed)
ANTICOAGULATION CONSULT NOTE  Pharmacy Consult for Heparin/warfarin Indication: ACS / afib  Allergies  Allergen Reactions  . Azithromycin Itching   Patient Measurements: Height: 5\' 11"  (180.3 cm) Weight: 204 lb 3.2 oz (92.6 kg) IBW/kg (Calculated) : 75.3 HEPARIN DW (KG): 92.4  Vital Signs: Temp: 98.5 F (36.9 C) (06/28 0823) Temp Source: Oral (06/28 0823) BP: 109/85 (06/28 0823) Pulse Rate: 95 (06/28 0823)  Labs:  Recent Labs  10/01/16 1016  10/01/16 1556 10/02/16 0347  10/02/16 0517  10/03/16 0249 10/03/16 0744 10/03/16 1535 10/04/16 0236  HGB  --   --   --   --   < > 12.5*  --  12.8* 13.3  --  12.0*  HCT  --   --   --   --   < > 38.6*  --  41.4 41.4  --  38.4*  PLT  --   --   --   --   < > 266  --  296 274  --  269  LABPROT  --   < > 20.0* 19.3*  --   --   --  16.6*  --   --  17.1*  INR  --   < > 1.69 1.61  --   --   --  1.33  --   --  1.38  HEPARINUNFRC  --   < > 0.51 0.26*  --   --   < >  --  0.26* 0.43 0.51  CREATININE  --   --  1.50*  --   --  1.56*  --  1.36*  --   --   --   TROPONINI 0.41*  --  0.34*  --   --   --   --   --   --   --   --   < > = values in this interval not displayed. Estimated Creatinine Clearance: 72.2 mL/min (A) (by C-G formula based on SCr of 1.36 mg/dL (H)).  Assessment: 56 YOM on warfarin PTA for afib, transitioned to IV heparin for ACS and AFib. Now s/p cath and transitioning back to warfarin.  Heparin level remains therapeutic. INR subtherapeutic at 1.38, stable. Hg down 12, plt wnl. No bleed documented.  PTA dose: 5mg  daily except 7.5mg  on Mon/Thurs (from last outpatient anticoagulation note 6/19)  Goal of Therapy:  Heparin level 0.3-0.7 units/ml Monitor platelets by anticoagulation protocol: Yes     Plan:  Continue heparin at 1500 units/hr Warfarin 10mg  PO x 1 Daily heparin level/INR/CBC Monitor for s/sx bleeding   Babs Bertin, PharmD, BCPS Clinical Pharmacist Rx Phone # for today: [phone] After 3:30PM, please call  Main Rx: #[id number] 10/04/2016 9:54 AM

## 2016-10-04 NOTE — Discharge Instructions (Signed)

## 2016-10-04 NOTE — Progress Notes (Signed)
Progress Note  Patient Name: David [name] Date of Encounter: 10/04/2016  Primary Cardiologist: Dr. Diona Browner  Subjective   Breathing is improving. Endorses orthopnea.  No chest pain.   Inpatient Medications    Scheduled Meds: . allopurinol  300 mg Oral Daily  . furosemide  40 mg Intravenous BID  . metoprolol tartrate  12.5 mg Oral BID  . [START ON 10/05/2016] potassium chloride  40 mEq Oral Daily  . sodium chloride flush  3 mL Intravenous Q12H  . warfarin  10 mg Oral ONCE-1800  . Warfarin - Pharmacist Dosing Inpatient   Does not apply q1800   Continuous Infusions: . sodium chloride    . heparin 1,500 Units/hr (10/03/16 2304)   PRN Meds: sodium chloride, acetaminophen, diazepam, ondansetron (ZOFRAN) IV, sodium chloride flush   Vital Signs    Vitals:   10/03/16 2335 10/04/16 0300 10/04/16 0535 10/04/16 0823  BP: 95/76  90/65 109/85  Pulse:   94 95  Resp:  (!) 26  16  Temp: 99 F (37.2 C)  98.2 F (36.8 C) 98.5 F (36.9 C)  TempSrc: Oral  Oral Oral  SpO2: 96%  98% 94%  Weight:      Height:        Intake/Output Summary (Last 24 hours) at 10/04/16 1051 Last data filed at 10/04/16 0536  Gross per 24 hour  Intake           254.48 ml  Output             2225 ml  Net         -1970.52 ml   Filed Weights   10/01/16 0742 10/01/16 1730 10/02/16 0619  Weight: 88.9 kg (196 lb) 92.4 kg (203 lb 12.8 oz) 92.6 kg (204 lb 3.2 oz)    Telemetry    Atrial fibrillation. PVCs. Rate <100.  Personally Reviewed  ECG    09/21/16: Intradermal fibrillation. Rate 100 bpm. F T-wave inversions.- Personally Reviewed  Physical Exam   GEN: Well-appearing. No acute distress.   Neck: JVP to mid neck. Prominent EJ.  No carotid bruits Cardiac: RRR, no murmurs, rubs, or gallops.  Respiratory: Clear to auscultation bilaterally. Faint bibasilar crackles.  No wheezes or rhonchi.  GI: Soft, nontender, non-distended  MS: No deformity. Neuro:  Nonfocal  Psych: Normal affect  Ext:  WWP.  2+ pitting edema to mid tibia bilaterally.  2+ radial, DP, PT pulses   Labs    Chemistry  Recent Labs Lab 10/01/16 0813 10/01/16 1556 10/02/16 0517 10/03/16 0249  NA 135 132* 137 137  K 3.4* 3.7 3.7 4.1  CL 104 102 105 108  CO2 22 21* 23 25  GLUCOSE 124* 152* 120* 110*  BUN 31* 30* 28* 25*  CREATININE 1.51* 1.50* 1.56* 1.36*  CALCIUM 8.0* 7.9* 7.9* 7.9*  PROT 5.5*  --   --   --   ALBUMIN 2.7*  --   --   --   AST 38  --   --   --   ALT 73*  --   --   --   ALKPHOS 150*  --   --   --   BILITOT 0.7  --   --   --   GFRNONAA 51* 51* 49* 58*  GFRAA 59* 59* 56* >60  ANIONGAP 9 9 9  4*     Hematology  Recent Labs Lab 10/03/16 0249 10/03/16 0744 10/04/16 0236  WBC 8.2 8.9 8.0  RBC 4.29 4.35 4.04*  HGB 12.8* 13.3 12.0*  HCT 41.4 41.4 38.4*  MCV 96.5 95.2 95.0  MCH 29.8 30.6 29.7  MCHC 30.9 32.1 31.3  RDW 13.9 13.6 13.6  PLT 296 274 269    Cardiac Enzymes  Recent Labs Lab 10/01/16 0813 10/01/16 1016 10/01/16 1556  TROPONINI 0.50* 0.41* 0.34*   No results for input(s): TROPIPOC in the last 168 hours.   BNP  Recent Labs Lab 10/01/16 0813  BNP 1,287.0*     DDimer No results for input(s): DDIMER in the last 168 hours.   Radiology    No results found.  Cardiac Studies   Echo 10/02/16: Study Conclusions  - Left ventricle: The cavity size was mildly dilated. Wall   thickness was normal. Systolic function was severely reduced. The   estimated ejection fraction was in the range of 15% to 20%.   Diffuse hypok inesis. The study is not technically sufficient to   allow evaluation of LV diastolic function. - Aortic valve: Mildly calcified annulus. Trileaflet; mildly   thickened leaflets. There was moderate regurgitation. Valve area   (VTI): 1.73 cm^2. Valve area (Vmax): 1.41 cm^2. - Mitral valve: Mildly calcified annulus. Normal thickness leaflets   . - Left atrium: The atrium was severely dilated. - Right ventricle: The cavity size was mildly  dilated. Systolic   function was mildly reduced. - Right atrium: The atrium was severely dilated. - Tricuspid valve: There was moderate regurgitation. - Pulmonary arteries: Systolic pressure was mildly to moderately   increased. PA peak pressure: 39 mm Hg (S). - Technically adequate study.  RHC 10/02/16: RA: A 20; v 21; mean 19 RV: 50/17 PA: 49/27; mean 37 PW: a 27;v 32; mean 26  LV: 93/17/27 AO: 100/69   Oxygen saturation in the PA 42% AO 99% with the patient on supplemental oxygen. By the [facility] method cardiac output was 2.9 L/m and cardiac index was 1.4 L/m/m.  LHC 10/02/16: normal coronary arteries.  Patient Profile     56 y.o. male hypertension, long-standing-persistent atrial fibrillation, alcohol abuse, and noncompliance here with acute on chronic systolic and diastolic heart failure.  Assessment & Plan    # Acute on chronic systolic and diastolic heart failure: LVEF is reduced to 30-20% from 40-45% previously. This occurred in the setting of medication noncompliance for the last 3 months. He also reports chest pain. LHC showed no CAD.  Right and left sided pressures are elevated.  He was diuresed -2L yesterday.  Renal function stable and he remains volume overloaded.  Carvedilol was switched to metoprolol for improved HR control.  We will start an ARB and spironolactone once he is diuresed and renal function is stable.  He had HA with Imdur.  He will need an echo in 3 months.  If his LVEF remains low he will need an ICD.  # Longstanding-persistent atrial fibrillation: Rates are better-controlled.  Carvedilol was switched to metoprolol as above.  Warfarin was held for cardiac catheterization.  Resumed warfarin 6/27. INR was 1.58 on admission.  # Demand ischemia:  Troponin was mildly elevated. His EKG shows lateral T-wave inversions that are more prominent than 09/25/16. Left heart catheterization showed no CAD.  Stopped aspirin given that he is on warfarin.  LDL was 67 on  admission.  Atorvastatin was started and stopped this admission.   # Noncompliance: Stressed the importance of taking medications as prescribed.   Signed, Chilton Si, MD  10/04/2016, 10:51 AM

## 2016-10-05 DIAGNOSIS — Z9119 Patient's noncompliance with other medical treatment and regimen: Secondary | ICD-10-CM

## 2016-10-05 LAB — BASIC METABOLIC PANEL
Anion gap: 7 (ref 5–15)
BUN: 22 mg/dL — ABNORMAL HIGH (ref 6–20)
CO2: 25 mmol/L (ref 22–32)
Calcium: 8 mg/dL — ABNORMAL LOW (ref 8.9–10.3)
Chloride: 103 mmol/L (ref 101–111)
Creatinine, Ser: 1.43 mg/dL — ABNORMAL HIGH (ref 0.61–1.24)
GFR calc Af Amer: >60 mL/min (ref >60)
GFR calc non Af Amer: 54 mL/min — ABNORMAL LOW (ref >60)
Glucose, Bld: 108 mg/dL — ABNORMAL HIGH (ref 65–99)
Potassium: 4.1 mmol/L (ref 3.5–5.1)
Sodium: 135 mmol/L (ref 135–145)

## 2016-10-05 LAB — CBC
HCT: 39.6 % (ref 39.0–52.0)
Hemoglobin: 12.5 g/dL — ABNORMAL LOW (ref 13.0–17.0)
MCH: 29.9 pg (ref 26.0–34.0)
MCHC: 31.6 g/dL (ref 30.0–36.0)
MCV: 94.7 fL (ref 78.0–100.0)
Platelets: 270 10*3/uL (ref 150–400)
RBC: 4.18 MIL/uL — ABNORMAL LOW (ref 4.22–5.81)
RDW: 13.6 % (ref 11.5–15.5)
WBC: 7.7 10*3/uL (ref 4.0–10.5)

## 2016-10-05 LAB — PROTIME-INR
INR: 1.35
Prothrombin Time: 16.8 seconds — ABNORMAL HIGH (ref 11.4–15.2)

## 2016-10-05 LAB — HEPARIN LEVEL (UNFRACTIONATED): Heparin Unfractionated: 0.54 IU/mL (ref 0.30–0.70)

## 2016-10-05 MED ORDER — TRAZODONE HCL 50 MG PO TABS
100.0000 mg | ORAL_TABLET | Freq: Every evening | ORAL | Status: DC | PRN
  Administered 2016-10-05 – 2016-10-08 (×4): 100 mg via ORAL
  Filled 2016-10-05 (×4): qty 2

## 2016-10-05 MED ORDER — METOPROLOL TARTRATE 25 MG PO TABS
25.0000 mg | ORAL_TABLET | Freq: Two times a day (BID) | ORAL | Status: DC
  Administered 2016-10-05 – 2016-10-06 (×2): 25 mg via ORAL
  Filled 2016-10-05 (×2): qty 1

## 2016-10-05 MED ORDER — FUROSEMIDE 10 MG/ML IJ SOLN
80.0000 mg | Freq: Two times a day (BID) | INTRAMUSCULAR | Status: DC
Start: 2016-10-05 — End: 2016-10-07
  Administered 2016-10-05 – 2016-10-07 (×4): 80 mg via INTRAVENOUS
  Filled 2016-10-05 (×4): qty 8

## 2016-10-05 MED ORDER — WARFARIN SODIUM 2.5 MG PO TABS
12.5000 mg | ORAL_TABLET | Freq: Once | ORAL | Status: AC
  Administered 2016-10-05: 18:00:00 12.5 mg via ORAL
  Filled 2016-10-05: qty 1

## 2016-10-05 NOTE — Plan of Care (Signed)
Problem: Safety: Goal: Ability to remain free from injury will improve Outcome: Progressing Patient verbalized understanding of calling for assistance when needed.  Steady gait observed when pt ambulated to bathroom.  Pt demonstrated correct use of call bell.  Progressing towards goal.

## 2016-10-05 NOTE — Progress Notes (Signed)
ANTICOAGULATION CONSULT NOTE  Pharmacy Consult for Heparin/warfarin Indication: ACS / afib  Allergies  Allergen Reactions  . Azithromycin Itching   Patient Measurements: Height: 5\' 11"  (180.3 cm) Weight: 200 lb 4.8 oz (90.9 kg) IBW/kg (Calculated) : 75.3 HEPARIN DW (KG): 92.4  Vital Signs: Temp: 98.3 F (36.8 C) (06/29 0415) Temp Source: Oral (06/29 0415) BP: 91/74 (06/29 0415) Pulse Rate: 87 (06/29 0415)  Labs:  Recent Labs  10/03/16 0249 10/03/16 0744 10/03/16 1535 10/04/16 0236 10/04/16 1106 10/05/16 0243  HGB 12.8* 13.3  --  12.0*  --  12.5*  HCT 41.4 41.4  --  38.4*  --  39.6  PLT 296 274  --  269  --  270  LABPROT 16.6*  --   --  17.1*  --  16.8*  INR 1.33  --   --  1.38  --  1.35  HEPARINUNFRC  --  0.26* 0.43 0.51  --  0.54  CREATININE 1.36*  --   --   --  1.45* 1.43*   Estimated Creatinine Clearance: 68.1 mL/min (A) (by C-G formula based on SCr of 1.43 mg/dL (H)).  Assessment: 56 YOM on warfarin PTA for afib, transitioned to IV heparin for ACS and AFib. Now s/p cath and transitioning back to warfarin.  Heparin level remains therapeutic. INR subtherapeutic. Hg down 12, plt wnl. No bleed documented.  PTA dose: 5mg  daily except 7.5mg  on Mon/Thurs (from last outpatient anticoagulation note 6/19)  Goal of Therapy:  Heparin level 0.3-0.7 units/ml Monitor platelets by anticoagulation protocol: Yes     Plan:  Continue heparin at 1500 units/hr Warfarin 12.5 mg PO x 1 Daily heparin level/INR/CBC Monitor for s/sx bleeding  Thank you Okey Regal, PharmD [phone] 10/05/2016 8:06 AM

## 2016-10-05 NOTE — Progress Notes (Signed)
Progress Note  Patient Name: David [name] Date of Encounter: 10/05/2016  Primary Cardiologist: Dr. Diona Browner  Subjective   Feeling well. Denies chest pain.  Shortness of breath is improving.  Trouble sleeping overnight.   Inpatient Medications    Scheduled Meds: . allopurinol  300 mg Oral Daily  . furosemide  40 mg Intravenous BID  . metoprolol tartrate  12.5 mg Oral BID  . potassium chloride  40 mEq Oral Daily  . sodium chloride flush  3 mL Intravenous Q12H  . warfarin  12.5 mg Oral ONCE-1800  . Warfarin - Pharmacist Dosing Inpatient   Does not apply q1800   Continuous Infusions: . sodium chloride    . heparin 1,500 Units/hr (10/05/16 0606)   PRN Meds: sodium chloride, acetaminophen, diazepam, ondansetron (ZOFRAN) IV, sodium chloride flush   Vital Signs    Vitals:   10/05/16 0300 10/05/16 0415 10/05/16 0500 10/05/16 0831  BP:  91/74  105/84  Pulse: (!) 33 87  100  Resp: (!) 38 16  (!) 22  Temp:  98.3 F (36.8 C)  98 F (36.7 C)  TempSrc:  Oral  Oral  SpO2: (!) 88%   100%  Weight:   90.9 kg (200 lb 4.8 oz)   Height:        Intake/Output Summary (Last 24 hours) at 10/05/16 1049 Last data filed at 10/05/16 0416  Gross per 24 hour  Intake              978 ml  Output             3250 ml  Net            -2272 ml   Filed Weights   10/01/16 1730 10/02/16 0619 10/05/16 0500  Weight: 92.4 kg (203 lb 12.8 oz) 92.6 kg (204 lb 3.2 oz) 90.9 kg (200 lb 4.8 oz)    Telemetry    Atrial fibrillation. PVCs. Rate 90-120s.   Personally Reviewed  ECG    No new results. - Personally Reviewed  Physical Exam   GEN: Well-appearing. No acute distress.   Neck: JVP 2cm above clavicle at 45 degrees.  EJ less prominent.  No carotid bruits Cardiac: Tachycardic.  Irregularly irregular.  No murmurs, rubs, or gallops.  Respiratory: Faint bibasilar crackles.  No wheezes or rhonchi.  GI: Soft, nontender, non-distended  MS: No deformity. Neuro:  Nonfocal  Psych: Normal  affect  Ext: WWP.  2+ pitting edema to mid tibia bilaterally.  2+ radial, DP, PT pulses   Labs    Chemistry  Recent Labs Lab 10/01/16 0813  10/03/16 0249 10/04/16 1106 10/05/16 0243  NA 135  < > 137 135 135  K 3.4*  < > 4.1 4.4 4.1  CL 104  < > 108 103 103  CO2 22  < > 25 29 25   GLUCOSE 124*  < > 110* 159* 108*  BUN 31*  < > 25* 23* 22*  CREATININE 1.51*  < > 1.36* 1.45* 1.43*  CALCIUM 8.0*  < > 7.9* 8.4* 8.0*  PROT 5.5*  --   --   --   --   ALBUMIN 2.7*  --   --   --   --   AST 38  --   --   --   --   ALT 73*  --   --   --   --   ALKPHOS 150*  --   --   --   --  BILITOT 0.7  --   --   --   --   GFRNONAA 51*  < > 58* 53* 54*  GFRAA 59*  < > >60 >60 >60  ANIONGAP 9  < > 4* 3* 7  < > = values in this interval not displayed.   Hematology  Recent Labs Lab 10/03/16 0744 10/04/16 0236 10/05/16 0243  WBC 8.9 8.0 7.7  RBC 4.35 4.04* 4.18*  HGB 13.3 12.0* 12.5*  HCT 41.4 38.4* 39.6  MCV 95.2 95.0 94.7  MCH 30.6 29.7 29.9  MCHC 32.1 31.3 31.6  RDW 13.6 13.6 13.6  PLT 274 269 270    Cardiac Enzymes  Recent Labs Lab 10/01/16 0813 10/01/16 1016 10/01/16 1556  TROPONINI 0.50* 0.41* 0.34*   No results for input(s): TROPIPOC in the last 168 hours.   BNP  Recent Labs Lab 10/01/16 0813  BNP 1,287.0*     DDimer No results for input(s): DDIMER in the last 168 hours.   Radiology    No results found.  Cardiac Studies   Echo 10/02/16: Study Conclusions  - Left ventricle: The cavity size was mildly dilated. Wall   thickness was normal. Systolic function was severely reduced. The   estimated ejection fraction was in the range of 15% to 20%.   Diffuse hypok inesis. The study is not technically sufficient to   allow evaluation of LV diastolic function. - Aortic valve: Mildly calcified annulus. Trileaflet; mildly   thickened leaflets. There was moderate regurgitation. Valve area   (VTI): 1.73 cm^2. Valve area (Vmax): 1.41 cm^2. - Mitral valve: Mildly  calcified annulus. Normal thickness leaflets   . - Left atrium: The atrium was severely dilated. - Right ventricle: The cavity size was mildly dilated. Systolic   function was mildly reduced. - Right atrium: The atrium was severely dilated. - Tricuspid valve: There was moderate regurgitation. - Pulmonary arteries: Systolic pressure was mildly to moderately   increased. PA peak pressure: 39 mm Hg (S). - Technically adequate study.  RHC 10/02/16: RA: A 20; v 21; mean 19 RV: 50/17 PA: 49/27; mean 37 PW: a 27;v 32; mean 26  LV: 93/17/27 AO: 100/69   Oxygen saturation in the PA 42% AO 99% with the patient on supplemental oxygen. By the [facility] method cardiac output was 2.9 L/m and cardiac index was 1.4 L/m/m.  LHC 10/02/16: normal coronary arteries.  Patient Profile     56 y.o. male hypertension, long-standing-persistent atrial fibrillation, alcohol abuse, and noncompliance here with acute on chronic systolic and diastolic heart failure.  Assessment & Plan    # Acute on chronic systolic and diastolic heart failure: LVEF is reduced to 30-20% from 40-45% previously. This occurred in the setting of medication noncompliance for the last 3 months. He also reported chest pain. LHC showed no CAD.  Right and left sided pressures were elevated.  He continues to diurese and was net negative 1.8L yesterday.  Renal function continued to improve. We'll increase Lasix to 80 mg IV twice a day.   We will start an ARB and spironolactone once he is diuresed and renal function is stable.  He had HA with Imdur.  He will need an echo in 3 months.  If his LVEF remains low he will need an ICD.  Carvedilol was switched to metoprolol for improved HR control.   Heart rates remain poorly-controlled. Increase metoprolol to 25 mg twice daily.  Will need to consolidate this to succinate prior to discharge.  # Longstanding-persistent atrial fibrillation:  Rates are still elevated. Carvedilol was switched to metoprolol as  above.  Warfarin was held for cardiac catheterization.  Resumed warfarin 6/27. INR was 1.58 on admission.  # Demand ischemia:  Troponin was mildly elevated. His EKG shows lateral T-wave inversions that are more prominent than 09/25/16. Left heart catheterization showed no CAD.  Stopped aspirin given that he is on warfarin.  LDL was 67 on admission.  Atorvastatin was started and stopped this admission.   # Noncompliance: Stressed the importance of taking medications as prescribed.   Signed, Chilton Si, MD  10/05/2016, 10:49 AM

## 2016-10-06 DIAGNOSIS — I481 Persistent atrial fibrillation: Secondary | ICD-10-CM

## 2016-10-06 DIAGNOSIS — I428 Other cardiomyopathies: Secondary | ICD-10-CM

## 2016-10-06 DIAGNOSIS — Z9114 Patient's other noncompliance with medication regimen: Secondary | ICD-10-CM

## 2016-10-06 LAB — BASIC METABOLIC PANEL
Anion gap: 8 (ref 5–15)
BUN: 22 mg/dL — ABNORMAL HIGH (ref 6–20)
CO2: 27 mmol/L (ref 22–32)
Calcium: 7.9 mg/dL — ABNORMAL LOW (ref 8.9–10.3)
Chloride: 101 mmol/L (ref 101–111)
Creatinine, Ser: 1.35 mg/dL — ABNORMAL HIGH (ref 0.61–1.24)
GFR calc Af Amer: >60 mL/min (ref >60)
GFR calc non Af Amer: 58 mL/min — ABNORMAL LOW (ref >60)
Glucose, Bld: 120 mg/dL — ABNORMAL HIGH (ref 65–99)
Potassium: 3.6 mmol/L (ref 3.5–5.1)
Sodium: 136 mmol/L (ref 135–145)

## 2016-10-06 LAB — CBC
HCT: 38.3 % — ABNORMAL LOW (ref 39.0–52.0)
Hemoglobin: 11.9 g/dL — ABNORMAL LOW (ref 13.0–17.0)
MCH: 29.8 pg (ref 26.0–34.0)
MCHC: 31.1 g/dL (ref 30.0–36.0)
MCV: 95.8 fL (ref 78.0–100.0)
Platelets: 246 10*3/uL (ref 150–400)
RBC: 4 MIL/uL — ABNORMAL LOW (ref 4.22–5.81)
RDW: 13.8 % (ref 11.5–15.5)
WBC: 6.3 10*3/uL (ref 4.0–10.5)

## 2016-10-06 LAB — PROTIME-INR
INR: 1.76
Prothrombin Time: 20.8 seconds — ABNORMAL HIGH (ref 11.4–15.2)

## 2016-10-06 LAB — HEPARIN LEVEL (UNFRACTIONATED): Heparin Unfractionated: 0.45 IU/mL (ref 0.30–0.70)

## 2016-10-06 MED ORDER — METOPROLOL SUCCINATE ER 50 MG PO TB24
50.0000 mg | ORAL_TABLET | Freq: Every day | ORAL | Status: DC
  Administered 2016-10-06 – 2016-10-09 (×4): 50 mg via ORAL
  Filled 2016-10-06 (×4): qty 1

## 2016-10-06 MED ORDER — WARFARIN SODIUM 2.5 MG PO TABS
12.5000 mg | ORAL_TABLET | Freq: Once | ORAL | Status: AC
  Administered 2016-10-06: 17:00:00 12.5 mg via ORAL
  Filled 2016-10-06: qty 1

## 2016-10-06 NOTE — Progress Notes (Signed)
Progress Note  Patient Name: David [name] Date of Encounter: 10/06/2016  Primary Cardiologist: Dr. Jonelle Sidle  Subjective   Breathing is gradually improving, still has leg edema. Some insomnia.  Inpatient Medications    Scheduled Meds: . allopurinol  300 mg Oral Daily  . furosemide  80 mg Intravenous BID  . metoprolol tartrate  25 mg Oral BID  . potassium chloride  40 mEq Oral Daily  . sodium chloride flush  3 mL Intravenous Q12H  . Warfarin - Pharmacist Dosing Inpatient   Does not apply q1800   Continuous Infusions: . sodium chloride    . heparin 1,500 Units/hr (10/06/16 0112)   PRN Meds: sodium chloride, acetaminophen, diazepam, ondansetron (ZOFRAN) IV, sodium chloride flush, traZODone   Vital Signs    Vitals:   10/06/16 0800 10/06/16 0853 10/06/16 0900 10/06/16 1000  BP: 97/77     Pulse: 89 98 83 96  Resp: (!) 25 (!) 30 (!) 24 (!) 26  Temp:  98 F (36.7 C)    TempSrc:  Oral    SpO2: 98%  100% 100%  Weight:      Height:        Intake/Output Summary (Last 24 hours) at 10/06/16 1019 Last data filed at 10/06/16 1000  Gross per 24 hour  Intake              600 ml  Output             6300 ml  Net            -5700 ml   Filed Weights   10/02/16 0619 10/05/16 0500 10/06/16 0351  Weight: 204 lb 3.2 oz (92.6 kg) 200 lb 4.8 oz (90.9 kg) 195 lb 8.8 oz (88.7 kg)    Telemetry    Atrial fibrillation. Personally reviewed.  ECG    Tracing from 10/03/2016 showed rate-controlled atrial fibrillation with nonspecific ST changes. Personally reviewed.  Physical Exam   GEN: No acute distress.   Neck: Mild JVD. Cardiac: Irregularly irregular, no gallop.  Respiratory:  No wheezing. Decreased breath sounds left base. GI: Soft, nontender, bowel sounds present. MS: 2+ lower leg edema; No deformity.  Labs    Chemistry Recent Labs Lab 10/01/16 0813  10/04/16 1106 10/05/16 0243 10/06/16 0606  NA 135  < > 135 135 136  K 3.4*  < > 4.4 4.1 3.6  CL  104  < > 103 103 101  CO2 22  < > 29 25 27   GLUCOSE 124*  < > 159* 108* 120*  BUN 31*  < > 23* 22* 22*  CREATININE 1.51*  < > 1.45* 1.43* 1.35*  CALCIUM 8.0*  < > 8.4* 8.0* 7.9*  PROT 5.5*  --   --   --   --   ALBUMIN 2.7*  --   --   --   --   AST 38  --   --   --   --   ALT 73*  --   --   --   --   ALKPHOS 150*  --   --   --   --   BILITOT 0.7  --   --   --   --   GFRNONAA 51*  < > 53* 54* 58*  GFRAA 59*  < > >60 >60 >60  ANIONGAP 9  < > 3* 7 8  < > = values in this interval not displayed.   Hematology  Recent Labs  Lab 10/04/16 0236 10/05/16 0243 10/06/16 0606  WBC 8.0 7.7 6.3  RBC 4.04* 4.18* 4.00*  HGB 12.0* 12.5* 11.9*  HCT 38.4* 39.6 38.3*  MCV 95.0 94.7 95.8  MCH 29.7 29.9 29.8  MCHC 31.3 31.6 31.1  RDW 13.6 13.6 13.8  PLT 269 270 246    Cardiac Enzymes  Recent Labs Lab 10/01/16 0813 10/01/16 1016 10/01/16 1556  TROPONINI 0.50* 0.41* 0.34*   No results for input(s): TROPIPOC in the last 168 hours.   BNP  Recent Labs Lab 10/01/16 0813  BNP 1,287.0*     Radiology    No results found.  Cardiac Studies   Cardiac catheterization 10/02/2016: Echo documentation of ejection fraction of 15-20%.  Dilated left ventricle with an LVEDP of 27 mm.  Moderate right heart pressure elevation with moderate pulmonary hypertension.  Normal epicardial coronary arteries.  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  RECOMMENDATION: Aggressive medical therapy for a nonischemic dilated cardiomyopathy.  Consider ARB therapy with future transition to entresto, spironolactone,  carvedilol, and consideration for initial LifeVest with possible need for future prophylactic ICD implantation.  Echocardiogram 10/01/2016: Study Conclusions  - Left ventricle: The cavity size was mildly dilated. Wall   thickness was normal. Systolic function was severely reduced. The   estimated ejection fraction was in the range of 15% to 20%.   Diffuse  hypokinesis. The study is not technically sufficient to   allow evaluation of LV diastolic function. - Aortic valve: Mildly calcified annulus. Trileaflet; mildly   thickened leaflets. There was moderate regurgitation. Valve area   (VTI): 1.73 cm^2. Valve area (Vmax): 1.41 cm^2. - Mitral valve: Mildly calcified annulus. Normal thickness leaflets. - Left atrium: The atrium was severely dilated. - Right ventricle: The cavity size was mildly dilated. Systolic   function was mildly reduced. - Right atrium: The atrium was severely dilated. - Tricuspid valve: There was moderate regurgitation. - Pulmonary arteries: Systolic pressure was mildly to moderately   increased. PA peak pressure: 39 mm Hg (S). - Technically adequate study.  Patient Profile     56 y.o. male with a history of hypertension, nonischemic cardiomyopathy, persistent atrial fibrillation, noncompliance, now presenting with worsening LV dysfunction and systolic heart failure. Cardiac catheterization shows no significant CAD. LVEF had previously normalized on medical therapy, although has declined in the setting of noncompliance.  Assessment & Plan    1. Acute on chronic combined heart failure with volume overload. Improving with diuresis.  2. Dilated nonischemic cardiomyopathy, LVEF approximately 20%. In the past patient had normalized LVEF on medical therapy.  3. Medication noncompliance contributing to the above problems.  4. Persistent atrial fibrillation, strategies heart rate control and anticoagulation. He has had prior cardioversions with recurring arrhythmia.  5. Mild elevation in troponin I. Coronary arteries are normal at cardiac catheterization.  Discussed with patient and wife. Plan to continue IV diuresis. Hold off on adding ARB with low-normal blood pressure. We will try to uptitrate beta blocker with change to Toprol-XL. Continue Coumadin per pharmacy.  Signed, Nona Dell, MD  10/06/2016, 10:19 AM

## 2016-10-06 NOTE — Progress Notes (Signed)
ANTICOAGULATION CONSULT NOTE  Pharmacy Consult for Heparin/warfarin Indication: ACS / afib  Allergies  Allergen Reactions  . Azithromycin Itching   Patient Measurements: Height: 5\' 11"  (180.3 cm) Weight: 195 lb 8.8 oz (88.7 kg) IBW/kg (Calculated) : 75.3 HEPARIN DW (KG): 92.4  Vital Signs: Temp: 98 F (36.7 C) (06/30 0853) Temp Source: Oral (06/30 0853) BP: 97/77 (06/30 0800) Pulse Rate: 96 (06/30 1000)  Labs:  Recent Labs  10/04/16 0236 10/04/16 1106 10/05/16 0243 10/06/16 0606  HGB 12.0*  --  12.5* 11.9*  HCT 38.4*  --  39.6 38.3*  PLT 269  --  270 246  LABPROT 17.1*  --  16.8* 20.8*  INR 1.38  --  1.35 1.76  HEPARINUNFRC 0.51  --  0.54 0.45  CREATININE  --  1.45* 1.43* 1.35*   Estimated Creatinine Clearance: 66.6 mL/min (A) (by C-G formula based on SCr of 1.35 mg/dL (H)).  Assessment: 56 YOM on warfarin PTA for afib, transitioned to IV heparin for ACS and AFib. Now s/p cath and transitioning back to warfarin.  Heparin level remains therapeutic. INR subtherapeutic, trending up to 1.76 with increased doses. CBC stable. No bleed documented.  PTA dose: 5mg  daily except 7.5mg  on Mon/Thurs (from last outpatient anticoagulation note 6/19)  Goal of Therapy:  Heparin level 0.3-0.7 units/ml  INR 2-3 Monitor platelets by anticoagulation protocol: Yes     Plan:  Continue heparin at 1500 units/hr Warfarin 12.5 mg PO x 1 Daily heparin level/INR/CBC Monitor for s/sx bleeding   Babs Bertin, PharmD, BCPS Clinical Pharmacist Rx Phone # for today: [phone] After 3:30PM, please call Main Rx: #[id number] 10/06/2016 11:20 AM

## 2016-10-06 NOTE — Progress Notes (Signed)
Received call at 0543 from telemetry with notification of patient having a 1.50 sec svr.  Patient asymptomatic, sleep at time of rhythm change.  BP 90/59, HR 73.

## 2016-10-07 LAB — CBC
HCT: 39.8 % (ref 39.0–52.0)
Hemoglobin: 12.6 g/dL — ABNORMAL LOW (ref 13.0–17.0)
MCH: 30.3 pg (ref 26.0–34.0)
MCHC: 31.7 g/dL (ref 30.0–36.0)
MCV: 95.7 fL (ref 78.0–100.0)
Platelets: 260 10*3/uL (ref 150–400)
RBC: 4.16 MIL/uL — ABNORMAL LOW (ref 4.22–5.81)
RDW: 13.8 % (ref 11.5–15.5)
WBC: 7.2 10*3/uL (ref 4.0–10.5)

## 2016-10-07 LAB — BASIC METABOLIC PANEL
Anion gap: 8 (ref 5–15)
BUN: 22 mg/dL — ABNORMAL HIGH (ref 6–20)
CO2: 33 mmol/L — ABNORMAL HIGH (ref 22–32)
Calcium: 8.3 mg/dL — ABNORMAL LOW (ref 8.9–10.3)
Chloride: 96 mmol/L — ABNORMAL LOW (ref 101–111)
Creatinine, Ser: 1.49 mg/dL — ABNORMAL HIGH (ref 0.61–1.24)
GFR calc Af Amer: 60 mL/min — ABNORMAL LOW (ref >60)
GFR calc non Af Amer: 52 mL/min — ABNORMAL LOW (ref >60)
Glucose, Bld: 89 mg/dL (ref 65–99)
Potassium: 3.9 mmol/L (ref 3.5–5.1)
Sodium: 137 mmol/L (ref 135–145)

## 2016-10-07 LAB — PROTIME-INR
INR: 2.24
Prothrombin Time: 25.1 seconds — ABNORMAL HIGH (ref 11.4–15.2)

## 2016-10-07 LAB — HEPARIN LEVEL (UNFRACTIONATED): Heparin Unfractionated: 0.46 IU/mL (ref 0.30–0.70)

## 2016-10-07 MED ORDER — DIGOXIN 125 MCG PO TABS
0.1250 mg | ORAL_TABLET | Freq: Every day | ORAL | Status: DC
  Administered 2016-10-07 – 2016-10-09 (×3): 0.125 mg via ORAL
  Filled 2016-10-07 (×3): qty 1

## 2016-10-07 MED ORDER — WARFARIN SODIUM 7.5 MG PO TABS
7.5000 mg | ORAL_TABLET | Freq: Once | ORAL | Status: AC
  Administered 2016-10-07: 7.5 mg via ORAL
  Filled 2016-10-07: qty 1

## 2016-10-07 MED ORDER — FUROSEMIDE 10 MG/ML IJ SOLN
80.0000 mg | Freq: Every day | INTRAMUSCULAR | Status: DC
  Administered 2016-10-08: 80 mg via INTRAVENOUS
  Filled 2016-10-07 (×2): qty 8

## 2016-10-07 NOTE — Progress Notes (Signed)
ANTICOAGULATION CONSULT NOTE  Pharmacy Consult for Heparin/warfarin Indication: ACS / afib  Allergies  Allergen Reactions  . Azithromycin Itching   Patient Measurements: Height: 5\' 11"  (180.3 cm) Weight: 185 lb 6.5 oz (84.1 kg) IBW/kg (Calculated) : 75.3 HEPARIN DW (KG): 92.4  Vital Signs: Temp: 97.7 F (36.5 C) (07/01 0849) Temp Source: Oral (07/01 0849) BP: 120/87 (07/01 1022) Pulse Rate: 104 (07/01 1022)  Labs:  Recent Labs  10/05/16 0243 10/06/16 0606 10/07/16 0519  HGB 12.5* 11.9* 12.6*  HCT 39.6 38.3* 39.8  PLT 270 246 260  LABPROT 16.8* 20.8* 25.1*  INR 1.35 1.76 2.24  HEPARINUNFRC 0.54 0.45 0.46  CREATININE 1.43* 1.35* 1.49*   Estimated Creatinine Clearance: 60.4 mL/min (A) (by C-G formula based on SCr of 1.49 mg/dL (H)).  Assessment: 56 YOM on warfarin PTA for afib, transitioned to IV heparin for ACS and AFib. Now s/p cath and transitioning back to warfarin.  Heparin level remains therapeutic. INR now therapeutic x1, trending up to 2.24 - will reduce dose today. CBC stable. No bleed documented.  PTA dose: 5mg  daily except 7.5mg  on Mon/Thurs (from last outpatient anticoagulation note 6/19)  Goal of Therapy:  Heparin level 0.3-0.7 units/ml  INR 2-3 Monitor platelets by anticoagulation protocol: Yes     Plan:  Continue heparin at 1500 units/hr - d/c when INR therapeutic >24hr (likely tomorrow) Warfarin 7.5mg  PO x 1 Daily heparin level/INR/CBC Monitor for s/sx bleeding   Babs Bertin, PharmD, BCPS Clinical Pharmacist Rx Phone # for today: [phone] After 3:30PM, please call Main Rx: #[id number] 10/07/2016 10:58 AM

## 2016-10-07 NOTE — Progress Notes (Signed)
Progress Note  Patient Name: David [name] Date of Encounter: 10/07/2016  Primary Cardiologist: Dr. Jonelle Sidle  Subjective   No chest pain or shortness of breath. Leg swelling improving. Appetite good.  Inpatient Medications    Scheduled Meds: . allopurinol  300 mg Oral Daily  . furosemide  80 mg Intravenous BID  . metoprolol succinate  50 mg Oral Daily  . potassium chloride  40 mEq Oral Daily  . sodium chloride flush  3 mL Intravenous Q12H  . Warfarin - Pharmacist Dosing Inpatient   Does not apply q1800   Continuous Infusions: . sodium chloride 250 mL (10/06/16 2000)  . heparin 1,500 Units/hr (10/06/16 1723)   PRN Meds: sodium chloride, acetaminophen, diazepam, ondansetron (ZOFRAN) IV, sodium chloride flush, traZODone   Vital Signs    Vitals:   10/06/16 2332 10/07/16 0345 10/07/16 0608 10/07/16 0849  BP: (!) 76/52 (!) 83/59  96/80  Pulse: 94 85  100  Resp: (!) 30 20  (!) 24  Temp: 97.8 F (36.6 C) 97.9 F (36.6 C)  97.7 F (36.5 C)  TempSrc: Oral Oral  Oral  SpO2: 94% 96%  99%  Weight:   185 lb 6.5 oz (84.1 kg)   Height:        Intake/Output Summary (Last 24 hours) at 10/07/16 0928 Last data filed at 10/07/16 0851  Gross per 24 hour  Intake              365 ml  Output             5775 ml  Net            -5410 ml   Filed Weights   10/05/16 0500 10/06/16 0351 10/07/16 0608  Weight: 200 lb 4.8 oz (90.9 kg) 195 lb 8.8 oz (88.7 kg) 185 lb 6.5 oz (84.1 kg)    Telemetry    Atrial fibrillation. Personally reviewed.  ECG    Tracing from 10/03/2016 showed rate-controlled atrial fibrillation with nonspecific ST changes. Personally reviewed.  Physical Exam   GEN: No acute distress.   Neck: Mild JVD. Cardiac: Irregularly irregular, no gallop.  Respiratory:  No wheezing. Decreased breath sounds left base. GI: Soft, nontender, bowel sounds present. MS: 2+ lower leg edema; No deformity.  Labs    Chemistry Recent Labs Lab 10/01/16 0813   10/05/16 0243 10/06/16 0606 10/07/16 0519  NA 135  < > 135 136 137  K 3.4*  < > 4.1 3.6 3.9  CL 104  < > 103 101 96*  CO2 22  < > 25 27 33*  GLUCOSE 124*  < > 108* 120* 89  BUN 31*  < > 22* 22* 22*  CREATININE 1.51*  < > 1.43* 1.35* 1.49*  CALCIUM 8.0*  < > 8.0* 7.9* 8.3*  PROT 5.5*  --   --   --   --   ALBUMIN 2.7*  --   --   --   --   AST 38  --   --   --   --   ALT 73*  --   --   --   --   ALKPHOS 150*  --   --   --   --   BILITOT 0.7  --   --   --   --   GFRNONAA 51*  < > 54* 58* 52*  GFRAA 59*  < > >60 >60 60*  ANIONGAP 9  < > 7 8 8   < > =  values in this interval not displayed.   Hematology  Recent Labs Lab 10/05/16 0243 10/06/16 0606 10/07/16 0519  WBC 7.7 6.3 7.2  RBC 4.18* 4.00* 4.16*  HGB 12.5* 11.9* 12.6*  HCT 39.6 38.3* 39.8  MCV 94.7 95.8 95.7  MCH 29.9 29.8 30.3  MCHC 31.6 31.1 31.7  RDW 13.6 13.8 13.8  PLT 270 246 260    Cardiac Enzymes  Recent Labs Lab 10/01/16 0813 10/01/16 1016 10/01/16 1556  TROPONINI 0.50* 0.41* 0.34*   No results for input(s): TROPIPOC in the last 168 hours.   BNP  Recent Labs Lab 10/01/16 0813  BNP 1,287.0*     Radiology    No results found.  Cardiac Studies   Cardiac catheterization 10/02/2016: Echo documentation of ejection fraction of 15-20%.  Dilated left ventricle with an LVEDP of 27 mm.  Moderate right heart pressure elevation with moderate pulmonary hypertension.  Normal epicardial coronary arteries.  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  RECOMMENDATION: Aggressive medical therapy for a nonischemic dilated cardiomyopathy.  Consider ARB therapy with future transition to entresto, spironolactone,  carvedilol, and consideration for initial LifeVest with possible need for future prophylactic ICD implantation.  Echocardiogram 10/01/2016: Study Conclusions  - Left ventricle: The cavity size was mildly dilated. Wall   thickness was normal. Systolic function was  severely reduced. The   estimated ejection fraction was in the range of 15% to 20%.   Diffuse hypokinesis. The study is not technically sufficient to   allow evaluation of LV diastolic function. - Aortic valve: Mildly calcified annulus. Trileaflet; mildly   thickened leaflets. There was moderate regurgitation. Valve area   (VTI): 1.73 cm^2. Valve area (Vmax): 1.41 cm^2. - Mitral valve: Mildly calcified annulus. Normal thickness leaflets. - Left atrium: The atrium was severely dilated. - Right ventricle: The cavity size was mildly dilated. Systolic   function was mildly reduced. - Right atrium: The atrium was severely dilated. - Tricuspid valve: There was moderate regurgitation. - Pulmonary arteries: Systolic pressure was mildly to moderately   increased. PA peak pressure: 39 mm Hg (S). - Technically adequate study.  Patient Profile     56 y.o. male with a history of hypertension, nonischemic cardiomyopathy, persistent atrial fibrillation, noncompliance, now presenting with worsening LV dysfunction and systolic heart failure. Cardiac catheterization shows no significant CAD. LVEF had previously normalized on medical therapy, although has declined in the setting of noncompliance.  Assessment & Plan    1. Acute on chronic combined heart failure with volume overload. Improving with diuresis. Weight down to 185 pounds.  2. Dilated nonischemic cardiomyopathy, LVEF approximately 20%. In the past patient had normalized LVEF on medical therapy.  3. Medication noncompliance contributing to the above problems.  4. Persistent atrial fibrillation, strategies heart rate control and anticoagulation. He has had prior cardioversions with recurring arrhythmia.  5. Mild elevation in troponin I. Coronary arteries are normal at cardiac catheterization.  Discussed with patient and wife. Continue Coumadin per pharmacy with heparin bridge. Beta blocker was changed to Toprol-XL at 50 mg daily, depending on  blood pressure he may need further up titration. Will start low-dose Lanoxin. Hold off ARB at present. Also plan to reduce Lasix to 80 mg IV once daily. Increase activity as tolerated.  Signed, Nona Dell, MD  10/07/2016, 9:28 AM

## 2016-10-08 LAB — CBC
HCT: 37.8 % — ABNORMAL LOW (ref 39.0–52.0)
Hemoglobin: 12.2 g/dL — ABNORMAL LOW (ref 13.0–17.0)
MCH: 31.1 pg (ref 26.0–34.0)
MCHC: 32.3 g/dL (ref 30.0–36.0)
MCV: 96.4 fL (ref 78.0–100.0)
Platelets: 254 10*3/uL (ref 150–400)
RBC: 3.92 MIL/uL — ABNORMAL LOW (ref 4.22–5.81)
RDW: 14 % (ref 11.5–15.5)
WBC: 6.3 10*3/uL (ref 4.0–10.5)

## 2016-10-08 LAB — BASIC METABOLIC PANEL
Anion gap: 6 (ref 5–15)
BUN: 18 mg/dL (ref 6–20)
CO2: 28 mmol/L (ref 22–32)
Calcium: 8 mg/dL — ABNORMAL LOW (ref 8.9–10.3)
Chloride: 102 mmol/L (ref 101–111)
Creatinine, Ser: 1.16 mg/dL (ref 0.61–1.24)
GFR calc Af Amer: >60 mL/min (ref >60)
GFR calc non Af Amer: >60 mL/min (ref >60)
Glucose, Bld: 113 mg/dL — ABNORMAL HIGH (ref 65–99)
Potassium: 3.7 mmol/L (ref 3.5–5.1)
Sodium: 136 mmol/L (ref 135–145)

## 2016-10-08 LAB — PROTIME-INR
INR: 2.25
Prothrombin Time: 25.3 seconds — ABNORMAL HIGH (ref 11.4–15.2)

## 2016-10-08 LAB — HEPARIN LEVEL (UNFRACTIONATED): Heparin Unfractionated: 0.34 IU/mL (ref 0.30–0.70)

## 2016-10-08 MED ORDER — SODIUM CHLORIDE 0.9 % IV SOLN
INTRAVENOUS | Status: DC
  Administered 2016-10-09: 08:00:00 via INTRAVENOUS

## 2016-10-08 MED ORDER — SODIUM CHLORIDE 0.9% FLUSH: 3.0000 mL | INTRAVENOUS | Status: DC | PRN

## 2016-10-08 MED ORDER — WARFARIN VIDEO: Freq: Once | Status: DC

## 2016-10-08 MED ORDER — SODIUM CHLORIDE 0.9 % IV SOLN: 250.0000 mL | INTRAVENOUS | Status: DC

## 2016-10-08 MED ORDER — WARFARIN SODIUM 7.5 MG PO TABS
7.5000 mg | ORAL_TABLET | Freq: Every day | ORAL | Status: DC
  Administered 2016-10-08: 7.5 mg via ORAL
  Filled 2016-10-08: qty 1

## 2016-10-08 MED ORDER — COUMADIN BOOK
Freq: Once | Status: AC
  Administered 2016-10-08: 10:00:00
  Filled 2016-10-08 (×2): qty 1

## 2016-10-08 MED ORDER — SODIUM CHLORIDE 0.9 % IV SOLN: INTRAVENOUS | Status: DC

## 2016-10-08 MED ORDER — SODIUM CHLORIDE 0.9% FLUSH
3.0000 mL | Freq: Two times a day (BID) | INTRAVENOUS | Status: DC
  Administered 2016-10-08: 3 mL via INTRAVENOUS

## 2016-10-08 MED ORDER — PATIENT'S GUIDE TO USING COUMADIN BOOK
Freq: Once | Status: DC
  Filled 2016-10-08: qty 1

## 2016-10-08 MED ORDER — LIVING BETTER WITH HEART FAILURE BOOK
Freq: Once | Status: AC
  Administered 2016-10-08: 10:00:00

## 2016-10-08 NOTE — Progress Notes (Signed)
Progress Note  Patient Name: David [name] Date of Encounter: 10/08/2016  Primary Cardiologist: Dr. Diona Browner  Subjective   Patient is feeling well; denies chest pain, SOB, and palpitations.   Inpatient Medications    Scheduled Meds: . allopurinol  300 mg Oral Daily  . digoxin  0.125 mg Oral Daily  . furosemide  80 mg Intravenous Daily  . metoprolol succinate  50 mg Oral Daily  . potassium chloride  40 mEq Oral Daily  . sodium chloride flush  3 mL Intravenous Q12H  . warfarin   Does not apply Once  . Warfarin - Pharmacist Dosing Inpatient   Does not apply q1800   Continuous Infusions: . sodium chloride 250 mL (10/06/16 2000)  . heparin 1,500 Units/hr (10/08/16 0700)   PRN Meds: sodium chloride, acetaminophen, diazepam, ondansetron (ZOFRAN) IV, sodium chloride flush, traZODone   Vital Signs    Vitals:   10/07/16 1613 10/07/16 1926 10/08/16 0343 10/08/16 0722  BP: 97/76 108/62 99/64 98/74   Pulse: 100 (!) 103 91 80  Resp: (!) 23 (!) 26 (!) 28 20  Temp:  98 F (36.7 C) 98.3 F (36.8 C) 97.6 F (36.4 C)  TempSrc:  Oral Oral Oral  SpO2:  99% 100% 100%  Weight:   184 lb 12.8 oz (83.8 kg)   Height:        Intake/Output Summary (Last 24 hours) at 10/08/16 0955 Last data filed at 10/08/16 0739  Gross per 24 hour  Intake           664.58 ml  Output             1600 ml  Net          -935.42 ml   Filed Weights   10/06/16 0351 10/07/16 0608 10/08/16 0343  Weight: 195 lb 8.8 oz (88.7 kg) 185 lb 6.5 oz (84.1 kg) 184 lb 12.8 oz (83.8 kg)     Physical Exam  Appears comfortable at rest lying flat, marked tachycardia with minimal activity General: Well developed, well nourished, male appearing in no acute distress. Head: Normocephalic, atraumatic.  Neck: Supple without bruits, JVP flat. Lungs:  Resp regular and unlabored, CTA. Heart: rapid and irregular, S1, S2, no S3, S4, or murmur; no rub. Abdomen: Soft, non-tender, non-distended with normoactive bowel  sounds. No hepatomegaly. No rebound/guarding. No obvious abdominal masses. Extremities: No clubbing, cyanosis, no edema. Distal pedal pulses are 2+ bilaterally. Neuro: Alert and oriented X 3. Moves all extremities spontaneously. Psych: Normal affect.  Labs    Chemistry Recent Labs Lab 10/06/16 0606 10/07/16 0519 10/08/16 0521  NA 136 137 136  K 3.6 3.9 3.7  CL 101 96* 102  CO2 27 33* 28  GLUCOSE 120* 89 113*  BUN 22* 22* 18  CREATININE 1.35* 1.49* 1.16  CALCIUM 7.9* 8.3* 8.0*  GFRNONAA 58* 52* >60  GFRAA >60 60* >60  ANIONGAP 8 8 6      Hematology Recent Labs Lab 10/06/16 0606 10/07/16 0519 10/08/16 0521  WBC 6.3 7.2 6.3  RBC 4.00* 4.16* 3.92*  HGB 11.9* 12.6* 12.2*  HCT 38.3* 39.8 37.8*  MCV 95.8 95.7 96.4  MCH 29.8 30.3 31.1  MCHC 31.1 31.7 32.3  RDW 13.8 13.8 14.0  PLT 246 260 254    Cardiac Enzymes Recent Labs Lab 10/01/16 1016 10/01/16 1556  TROPONINI 0.41* 0.34*    Radiology    No results found.   Telemetry    AFib with RVR - Personally Reviewed  ECG  AFib, probably LVH, secondary repol changes - Personally Reviewed   Cardiac Studies   Cardiac catheterization 10/02/2016: Echo documentation of ejection fraction of 15-20%.  Dilated left ventricle with an LVEDP of 27 mm.  Moderate right heart pressure elevation with moderate pulmonary hypertension.  Normal epicardial coronary arteries.  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  RECOMMENDATION: Aggressive medical therapy for a nonischemic dilated cardiomyopathy. Consider ARB therapy with future transition to entresto, spironolactone, carvedilol, and consideration for initial LifeVest with possible need for future prophylactic ICD implantation.  Echocardiogram 10/01/2016: Study Conclusions  - Left ventricle: The cavity size was mildly dilated. Wall thickness was normal. Systolic function was severely reduced. The estimated ejection fraction  was in the range of 15% to 20%. Diffuse hypokinesis. The study is not technically sufficient to allow evaluation of LV diastolic function. - Aortic valve: Mildly calcified annulus. Trileaflet; mildly thickened leaflets. There was moderate regurgitation. Valve area (VTI): 1.73 cm^2. Valve area (Vmax): 1.41 cm^2. - Mitral valve: Mildly calcified annulus. Normal thickness leaflets. - Left atrium: The atrium was severely dilated. - Right ventricle: The cavity size was mildly dilated. Systolic function was mildly reduced. - Right atrium: The atrium was severely dilated. - Tricuspid valve: There was moderate regurgitation. - Pulmonary arteries: Systolic pressure was mildly to moderately increased. PA peak pressure: 39 mm Hg (S). - Technically adequate study.  Patient Profile     56 y.o. male with a history of hypertension, nonischemic cardiomyopathy, persistent atrial fibrillation, noncompliance, now presenting with worsening LV dysfunction and systolic heart failure. Cardiac catheterization shows no significant CAD. LVEF had previously normalized on medical therapy, although has declined in the setting of noncompliance.  Assessment & Plan    1. Acute on chronic combined heart failure - diuresing on 80 mg IV lasix SID - wt down to 184 lbs from 196 lbs at admission, dry weight may be in the low 200s - overall net negative 18 L with 2.7 L urine output yesterday - hold  Lasix tomorrow, will likely need a lower dose   2. Persistent Atrial Fibrillation - per telemetry, not rate-controlled - 80-130s, currently in the 120-130s - consider increasing toprol to 75 mg daily - stopped heparin drip, INR therapeutic at 2.25 (2.24) - plan for TEE/DCCV tomorrow - last documented SR by [place] 04/2015 following DCCV   3. Medication noncompliance - coumadin is a better choice for anticoagulation in a pt with medication noncompliance - discussed importance of taking medications to prevent  progression of CHF and recurrence of Afib RVR   Signed, Marcelino Duster , PA-C 9:55 AM 10/08/2016 Pager: [phone]  I have seen and examined the patient along with Marcelino Duster , PA-C.  I have reviewed the chart, notes and new data.  I agree with PA's note.  Key new complaints: denies dizziness, but has exertional dyspnea. Edema is gone completely. Drinks 25 oz beer daily Key examination changes: no JVD, no edema, rapid irregular rhythm Key new findings / data: creatinine back to normal range  PLAN: Difficult to achieve consistent rate control since BP limits there amount of AV node blocking agents. Schedule for TEE DCCV in AM. This procedure has been fully reviewed with the patient and written informed consent has been obtained.   Thurmon Fair, MD, [facility] [phone] 10/08/2016, 10:19 AM

## 2016-10-08 NOTE — Progress Notes (Signed)
Provided education regarding what heart failure is, low sodium diet, fluid restrictions,I/O's,  A-fib, risk for stroke, and importance of weighing himself daily. Patient would benefit from attending Heart Failure clinic as an outpatient with wife. Patient had a bag of Original style Lays potato chips at bedside. Patient also concerned with his ability to work where he works d/t it being approximately 115 degrees in the summer at his job which requires him to drink a lot to stay hydrated. Patient was eating a Whopper hamburger Saturday night. Wife was very interested in learning about heart failure and learning what she needed to do to keep him out of the hospital.

## 2016-10-08 NOTE — Progress Notes (Signed)
    [facility] has been requested to perform a transesophageal echocardiogram on David [name] for DCCV.  After careful review of history and examination, the risks and benefits of transesophageal echocardiogram have been explained including risks of esophageal damage, perforation (1:10,000 risk), bleeding, pharyngeal hematoma as well as other potential complications associated with conscious sedation including aspiration, arrhythmia, respiratory failure and death. Alternatives to treatment were discussed, questions were answered. Patient is willing to proceed.   Roe Rutherford Janika Jedlicka, [place]  10/08/2016 1:29 PM

## 2016-10-08 NOTE — Progress Notes (Signed)
Text paged Dr.Qureshi to notify him of his BP 77/52, MAP 62. Patient asymptomatic, sleeping. Took a Valium last night prior to bed which may have effected BP. Will continue to monitor closely.

## 2016-10-08 NOTE — Progress Notes (Signed)
ANTICOAGULATION CONSULT NOTE  Pharmacy Consult for Heparin/warfarin Indication: ACS / afib  Allergies  Allergen Reactions  . Azithromycin Itching   Patient Measurements: Height: 5\' 11"  (180.3 cm) Weight: 184 lb 12.8 oz (83.8 kg) IBW/kg (Calculated) : 75.3 HEPARIN DW (KG): 92.4  Vital Signs: Temp: 97.7 F (36.5 C) (07/02 1223) Temp Source: Oral (07/02 1223) BP: 89/69 (07/02 1223) Pulse Rate: 119 (07/02 1300)  Labs:  Recent Labs  10/06/16 0606 10/07/16 0519 10/08/16 0521  HGB 11.9* 12.6* 12.2*  HCT 38.3* 39.8 37.8*  PLT 246 260 254  LABPROT 20.8* 25.1* 25.3*  INR 1.76 2.24 2.25  HEPARINUNFRC 0.45 0.46 0.34  CREATININE 1.35* 1.49* 1.16   Estimated Creatinine Clearance: 77.5 mL/min (by C-G formula based on SCr of 1.16 mg/dL).  Assessment: David [name] on warfarin PTA for afib, transitioned to IV heparin for ACS and AFib. Now s/p cath and transitioning back to warfarin.  S/p Cath NICM EF 15%   Heparin level remains therapeutic 0.34 on heparin drip 1500 uts/hr - now off. INR now therapeutic x2, trending up to 2.25 - CBC stable. No bleed documented.  PTA dose: 5mg  daily except 7.5mg  on Mon/Thurs (from last outpatient anticoagulation note 6/19)  Goal of Therapy:  Heparin level 0.3-0.7 units/ml  INR 2-3 Monitor platelets by anticoagulation protocol: Yes     Plan:  Stop heparin Warfarin 7.5mg  PO daily Daily Protime Monitor for s/sx bleeding  [name] Pharm.D. CPP, BCPS Clinical Pharmacist [phone] 10/08/2016 2:30 PM

## 2016-10-08 NOTE — Care Management Note (Signed)
Case Management Note  Patient Details  Name: David [name] MRN: [id number] Date of Birth: [birth year]  Subjective/Objective:       Pt admitted with SOB             Action/Plan:   PTA independent from home with wife.  Pt states he has PCP and active medical/prescription insurance.  Pt states "before I was unable to buy all of my medications because they added up to over $100".  However, pt now denies barriers to paying copays for prescribed medications.  CM will continue to follow for discharge needs   Expected Discharge Date:  10/03/16               Expected Discharge Plan:  Home/Self Care  In-House Referral:     Discharge planning Services  CM Consult  Post Acute Care Choice:    Choice offered to:     DME Arranged:    DME Agency:     HH Arranged:    HH Agency:     Status of Service:     If discussed at [employer], dates discussed:    Additional Comments: CM received consult to arrange [facility] appt - pt is not yet active with [facility] services , states once discharged he will initiate process with [facility] -  Daughter will assist him.   Cherylann Parr, RN 10/08/2016, 4:08 PM

## 2016-10-08 NOTE — Progress Notes (Signed)
Pt is scheduled for TEE/DCCV tomorrow, 10/09/16, at 11:AM with Dr. Elease Hashimoto. NPO at MN with KVO.  Thanks  Roe Rutherford Duke, PA-C 10/08/2016, 10:32 AM [phone] [facility]

## 2016-10-09 ENCOUNTER — Telehealth: Payer: Self-pay

## 2016-10-09 ENCOUNTER — Inpatient Hospital Stay (HOSPITAL_COMMUNITY): Payer: 59 | Admitting: Anesthesiology

## 2016-10-09 ENCOUNTER — Inpatient Hospital Stay (HOSPITAL_COMMUNITY): Payer: 59

## 2016-10-09 ENCOUNTER — Encounter (HOSPITAL_COMMUNITY)
Admission: EM | Disposition: A | Discharge status: Home / Self Care | Payer: Self-pay | Source: Home / Self Care | Attending: Cardiology

## 2016-10-09 ENCOUNTER — Encounter (HOSPITAL_COMMUNITY): Payer: Self-pay | Admitting: *Deleted

## 2016-10-09 DIAGNOSIS — I4891 Unspecified atrial fibrillation: Secondary | ICD-10-CM

## 2016-10-09 DIAGNOSIS — I340 Nonrheumatic mitral (valve) insufficiency: Secondary | ICD-10-CM

## 2016-10-09 DIAGNOSIS — N179 Acute kidney failure, unspecified: Secondary | ICD-10-CM

## 2016-10-09 HISTORY — PX: TEE WITHOUT CARDIOVERSION: SHX5443

## 2016-10-09 LAB — BASIC METABOLIC PANEL
Anion gap: 7 (ref 5–15)
Anion gap: 9 (ref 5–15)
BUN: 17 mg/dL (ref 6–20)
BUN: 18 mg/dL (ref 6–20)
CO2: 28 mmol/L (ref 22–32)
CO2: 30 mmol/L (ref 22–32)
Calcium: 8.5 mg/dL — ABNORMAL LOW (ref 8.9–10.3)
Calcium: 8.5 mg/dL — ABNORMAL LOW (ref 8.9–10.3)
Chloride: 100 mmol/L — ABNORMAL LOW (ref 101–111)
Chloride: 100 mmol/L — ABNORMAL LOW (ref 101–111)
Creatinine, Ser: 1.17 mg/dL (ref 0.61–1.24)
Creatinine, Ser: 1.19 mg/dL (ref 0.61–1.24)
GFR calc Af Amer: >60 mL/min (ref >60)
GFR calc Af Amer: >60 mL/min (ref >60)
GFR calc non Af Amer: >60 mL/min (ref >60)
GFR calc non Af Amer: >60 mL/min (ref >60)
Glucose, Bld: 103 mg/dL — ABNORMAL HIGH (ref 65–99)
Glucose, Bld: 103 mg/dL — ABNORMAL HIGH (ref 65–99)
Potassium: 4.2 mmol/L (ref 3.5–5.1)
Potassium: 4.2 mmol/L (ref 3.5–5.1)
Sodium: 137 mmol/L (ref 135–145)
Sodium: 137 mmol/L (ref 135–145)

## 2016-10-09 LAB — PROTIME-INR
INR: 2.05
INR: 2.07
Prothrombin Time: 23.5 seconds — ABNORMAL HIGH (ref 11.4–15.2)
Prothrombin Time: 23.6 seconds — ABNORMAL HIGH (ref 11.4–15.2)

## 2016-10-09 LAB — CBC
HCT: 39.5 % (ref 39.0–52.0)
Hemoglobin: 12.3 g/dL — ABNORMAL LOW (ref 13.0–17.0)
MCH: 29.9 pg (ref 26.0–34.0)
MCHC: 31.1 g/dL (ref 30.0–36.0)
MCV: 96.1 fL (ref 78.0–100.0)
Platelets: 256 10*3/uL (ref 150–400)
RBC: 4.11 MIL/uL — ABNORMAL LOW (ref 4.22–5.81)
RDW: 13.8 % (ref 11.5–15.5)
WBC: 6.4 10*3/uL (ref 4.0–10.5)

## 2016-10-09 LAB — HEPARIN LEVEL (UNFRACTIONATED): Heparin Unfractionated: <0.1 IU/mL — ABNORMAL LOW (ref 0.30–0.70)

## 2016-10-09 SURGERY — ECHOCARDIOGRAM, TRANSESOPHAGEAL
Anesthesia: Monitor Anesthesia Care

## 2016-10-09 MED ORDER — FENTANYL CITRATE (PF) 100 MCG/2ML IJ SOLN: 25.0000 ug | INTRAMUSCULAR | Status: DC | PRN

## 2016-10-09 MED ORDER — OXYCODONE HCL 5 MG PO TABS: 5.0000 mg | ORAL_TABLET | Freq: Once | ORAL | Status: DC | PRN

## 2016-10-09 MED ORDER — MIDAZOLAM HCL 5 MG/5ML IJ SOLN
INTRAMUSCULAR | Status: DC | PRN
  Administered 2016-10-09: 2 mg via INTRAVENOUS

## 2016-10-09 MED ORDER — ROSUVASTATIN CALCIUM 20 MG PO TABS: 40.0000 mg | ORAL_TABLET | Freq: Every day | ORAL | Status: DC

## 2016-10-09 MED ORDER — OXYCODONE HCL 5 MG/5ML PO SOLN: 5.0000 mg | Freq: Once | ORAL | Status: DC | PRN

## 2016-10-09 MED ORDER — ONDANSETRON HCL 4 MG/2ML IJ SOLN: 4.0000 mg | Freq: Once | INTRAMUSCULAR | Status: DC | PRN

## 2016-10-09 MED ORDER — WARFARIN SODIUM 5 MG PO TABS: ORAL_TABLET | ORAL | 0 refills | Status: DC

## 2016-10-09 MED ORDER — POTASSIUM CHLORIDE CRYS ER 20 MEQ PO TBCR: EXTENDED_RELEASE_TABLET | ORAL | 1 refills | Status: DC

## 2016-10-09 MED ORDER — DIGOXIN 125 MCG PO TABS: 0.1250 mg | ORAL_TABLET | Freq: Every day | ORAL | 3 refills | Status: DC

## 2016-10-09 MED ORDER — PHENYLEPHRINE HCL 10 MG/ML IJ SOLN
INTRAVENOUS | Status: DC | PRN
  Administered 2016-10-09: 60 ug/min via INTRAVENOUS

## 2016-10-09 MED ORDER — BUTAMBEN-TETRACAINE-BENZOCAINE 2-2-14 % EX AERO
INHALATION_SPRAY | CUTANEOUS | Status: DC | PRN
  Administered 2016-10-09: 2 via TOPICAL

## 2016-10-09 MED ORDER — METOPROLOL SUCCINATE ER 50 MG PO TB24: 50.0000 mg | ORAL_TABLET | Freq: Every day | ORAL | 3 refills | Status: DC

## 2016-10-09 MED ORDER — PROPOFOL 10 MG/ML IV BOLUS
INTRAVENOUS | Status: DC | PRN
  Administered 2016-10-09: 50 mg via INTRAVENOUS

## 2016-10-09 MED ORDER — PROPOFOL 500 MG/50ML IV EMUL
INTRAVENOUS | Status: DC | PRN
  Administered 2016-10-09: 100 ug/kg/min via INTRAVENOUS

## 2016-10-09 MED ORDER — FUROSEMIDE 40 MG PO TABS: 40.0000 mg | ORAL_TABLET | Freq: Two times a day (BID) | ORAL | 6 refills | Status: DC

## 2016-10-09 NOTE — Care Management Note (Addendum)
Case Management Note  Patient Details  Name: GHALI HUFFMAN MRN: [id number] Date of Birth: [birth year]  Subjective/Objective:       Pt admitted with SOB             Action/Plan:   PTA independent from home with wife.  Pt states he has PCP and active medical/prescription insurance.  Pt states "before I was unable to buy all of my medications because they added up to over $100".  However, pt now denies barriers to paying copays for prescribed medications.  CM will continue to follow for discharge needs   Expected Discharge Date:  10/03/16               Expected Discharge Plan:  Home/Self Care  In-House Referral:     Discharge planning Services  CM Consult  Post Acute Care Choice:    Choice offered to:     DME Arranged:    DME Agency:     HH Arranged:    HH Agency:     Status of Service:     If discussed at [employer], dates discussed:    Additional Comments: 10/09/2016  CM spoke with pts wife - wife will take pt to [facility] to begin process of getting pt established at discharge -  Per wife ; she knows where to take him and what information to provide.  Until then, pt will continue to utilize his PCP Dr Jeanice Lim.  Pt continues to deny barriers to obtaining medications as prescribed   CM spoke with cardiology - pt will not be appropriate for Life vest.  Discussed in LOS 10/09/16 - pt remains appropriate for continued stay.  Pt scheduled for TEE and cardioversion today for uncontrolled a fib  10/08/16 CM received consult to arrange [facility] appt - pt is not yet active with [facility] services , states once discharged he will initiate process with [facility] -  Daughter will assist him.   Cherylann Parr, RN 10/09/2016, 11:23 AM

## 2016-10-09 NOTE — Discharge Summary (Signed)
Discharge Summary    Patient ID: David [name],  MRN: [id number], DOB/AGE: 10/28/1960 56 y.o.  Admit date: 10/01/2016 Discharge date: 10/09/2016   Primary Care Provider: Milinda Antis F Primary Cardiologist: Dr. Diona Browner  Discharge Diagnoses    Principal Problem:   NSTEMI (non-ST elevated myocardial infarction) [facility]) Active Problems:   Atrial fibrillation (HCC)   Systolic congestive heart failure (HCC)   CKD (chronic kidney disease) stage 2, GFR 60-89 ml/min   Hyperlipidemia   Dyspnea   Acute renal failure superimposed on stage 2 chronic kidney disease (HCC)   Acute on chronic systolic and diastolic heart failure, NYHA class 3 (HCC)   Demand ischemia (HCC)   Allergies Allergies  Allergen Reactions  . Azithromycin Itching     History of Present Illness     David BRINKMEIER IIIis a 56 y.o.malewith a hx of persistent atrial fib, ICM, HTN, and medical non-compliance,who is being seen for the evaluation of dyspnea and elevated troponin.  DavidRosseris a 4 male recently seen by Dr. Eden Emms in the office on 09/25/2016 for ongoing assessment of atrial fibrillation, nonischemic cardiopathy with last EF 40-45% on 02/21/2016, hypertension, with history of alcohol abuse.   The patient is normally followed by Dr. Diona Browner, but he had been out of his medications for 3 months, was beginning to have issues with chest pain, and some shortness of breath, therefore his wife requested that he be seen sooner than scheduled appointment.   Dr. Eden Emms restarted him on his medications that he had been taking in the past, but increased carvedilol to 12.5 mg twice a day and increased Zestril to 10 mg daily. CHADSVASC score was 3, he was unable to afford DOAC, and therefore restarted on Coumadin with follow-up in our office Coumadin clinic. At that time he denied any chest pain or palpitations.   The patient presented to the emergency room with complaints of lower extremity edema,  along with PND and worsening dyspnea on exertion. He states that walking across room causes significant dyspnea, he awakes at night time as he is unable to breathe and has to sit up, he also noted worsening lower extremity edema compared to last week when he was seen by Dr. Eden Emms. He denies medical noncompliance and has been taking the diuretic as prescribed, with no improvement in his symptoms. He also complains of "sticking chest pain" moving around his chest. He does state that he wasn't able to fill his potassium prescription yet.  On arrival to ER blood pressure 124/82 O2 sat 94% heart rate 101. CBC was within normal limits. He was found to be mildly hypokalemic with potassium of 3.4, glucose 124, BUN 31, creatinine 1.51. Protein and albumin were also low. BNP elevated at 1.287. Troponin elevated at 0.50. Chest x-ray revealed stable cardiomegaly, no evidence of CHF or pneumonia, questionable small pleural effusion or atelectasis in the left lung base. EKG revealed atrial fibrillation with T-wave inversion in the lateral leads compared to EKG on 09/25/2016, with no T-wave inversions noted at that time.  Hospital Course     Consultants: None  NSTEMI, acute on chronic systolic and diastolic heart failure: David [name] was admitted to Cardiology and underwent right and left heart catheterization on 10/02/16 without CAD and without intervention (see studies below). Elevated troponin likely due to demand ischemia in the setting of heart failure exacerbation. Echo showed LVEF of 15-20% and diffuse hypokinesis. He was kept inpatient for aggressive diuresis given his progressive chronic systolic heart failure  and dyspnea. This decline is likely secondary to his medication noncompliance. He was discharged on 40 mg PO lasix BID with conservative potassium replacement in the setting of elevated creatinine (1.19 at discharge). He was not discharged with a LifeVest given his history of noncompliance. This may be  reassessed with an echo in three months if he remains compliant on his medications. His discharge weight was 184 lbs.   He was discharged on toprol, digoxin, and lasix. His pressures were marginal at discharge (sBP 90-100s), ACEI/ARB and spironolactone were not started. Continue to assess ability to titrate heart failure medications and lasix regimen.  Atrial fibrillation: He was reate-controlled for the majority of his hospitalization using toprol. Digoxin was also initiated, given his chronic heart failure with reduced EF. Coumadin was used for [facility] for cost and as a better choice in a patient with a history of noncompliance. TEE and DCCV were scheduled for 10/09/16. TEE with positive LAA thrombus, so DCCV was aborted. His INR has been therapeutic on his coumadin regimen, managed by pharmacy. Will plan for DCCV in 4 weeks if he has complied with coumadin dosing with verified weekly therapeutic INRs.   Patient seen and examined by Dr. Royann Shivers [name] and was stable for discharge. All follow up has been arranged.  _____________  Discharge Vitals Blood pressure (!) 83/69, pulse 99, temperature 98 F (36.7 C), temperature source Oral, resp. rate (!) 28, height 5\' 11"  (1.803 m), weight 184 lb 12.8 oz (83.8 kg), SpO2 92 %.  Filed Weights   10/06/16 0351 10/07/16 0608 10/08/16 0343  Weight: 195 lb 8.8 oz (88.7 kg) 185 lb 6.5 oz (84.1 kg) 184 lb 12.8 oz (83.8 kg)    Labs & Radiologic Studies    CBC  Recent Labs  10/08/16 0521 10/09/16 0516  WBC 6.3 6.4  HGB 12.2* 12.3*  HCT 37.8* 39.5  MCV 96.4 96.1  PLT 254 256   Basic Metabolic Panel  Recent Labs  10/08/16 0521 10/09/16 0516  NA 136 137  137  K 3.7 4.2  4.2  CL 102 100*  100*  CO2 28 30  28   GLUCOSE 113* 103*  103*  BUN 18 17  18   CREATININE 1.16 1.19  1.17  CALCIUM 8.0* 8.5*  8.5*   Liver Function Tests No results for input(s): AST, ALT, ALKPHOS, BILITOT, PROT, ALBUMIN in the last 72 hours. No results for input(s):  LIPASE, AMYLASE in the last 72 hours. Cardiac Enzymes No results for input(s): CKTOTAL, CKMB, CKMBINDEX, TROPONINI in the last 72 hours. BNP Invalid input(s): POCBNP D-Dimer No results for input(s): DDIMER in the last 72 hours. Hemoglobin A1C No results for input(s): HGBA1C in the last 72 hours. Fasting Lipid Panel No results for input(s): CHOL, HDL, LDLCALC, TRIG, CHOLHDL, LDLDIRECT in the last 72 hours. Thyroid Function Tests No results for input(s): TSH, T4TOTAL, T3FREE, THYROIDAB in the last 72 hours.  Invalid input(s): FREET3 _____________  Dg Chest 2 View  Result Date: 10/01/2016 CLINICAL DATA:  SOB AND BIL LEG SWELLING X 1+ WEEKS, A-FIB, NON SMOKER EXAM: CHEST  2 VIEW COMPARISON:  Chest x-ray dated 12/05/2013. FINDINGS: Stable cardiomegaly. Overall cardiomediastinal silhouette is stable. Questionable small pleural effusion and/or mild atelectasis at the left lung base. Lungs otherwise clear. No pleural effusion or pneumothorax. No acute or suspicious osseous finding. IMPRESSION: Stable cardiomegaly. No active cardiopulmonary disease. Questionable small pleural effusion and/or atelectasis at the left lung base. No evidence of CHF or pneumonia. Electronically Signed   By: Weyman Croon  Linde Gillis M.D.   On: 10/01/2016 08:47     Diagnostic Studies/Procedures    TEE/DCCV 10/09/16: Left Atrium/ Left atrial appendage: + LAA thrombus.  Extensive spontansous contrast    Echocardiogram 10/01/16: Study Conclusions - Left ventricle: The cavity size was mildly dilated. Wall   thickness was normal. Systolic function was severely reduced. The   estimated ejection fraction was in the range of 15% to 20%.   Diffuse hypokinesis. The study is not technically sufficient to   allow evaluation of LV diastolic function. - Aortic valve: Mildly calcified annulus. Trileaflet; mildly   thickened leaflets. There was moderate regurgitation. Valve area   (VTI): 1.73 cm^2. Valve area (Vmax): 1.41 cm^2. - Mitral  valve: Mildly calcified annulus. Normal thickness leaflets   . - Left atrium: The atrium was severely dilated. - Right ventricle: The cavity size was mildly dilated. Systolic   function was mildly reduced. - Right atrium: The atrium was severely dilated. - Tricuspid valve: There was moderate regurgitation. - Pulmonary arteries: Systolic pressure was mildly to moderately   increased. PA peak pressure: 39 mm Hg (S). - Technically adequate study.   Left and Right Heart Catheterization 10/02/16: Echo documentation of ejection fraction of 15-20%. Dilated left ventricle with an LVEDP of 27 mm. Moderate right heart pressure elevation with moderate pulmonary hypertension. Normal epicardial coronary arteries. 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 RECOMMENDATION: Aggressive medical therapy for a nonischemic dilated cardiomyopathy.  Consider ARB therapy with future transition to entresto, spironolactone,  carvedilol, and consideration for initial LifeVest with possible need for future prophylactic ICD implantation.    Disposition   David [name] in good condition.  Follow-up Plans & Appointments    Follow-up Information    Jodelle Gross, NP Follow up on 10/18/2016.   Specialties:  Nurse Practitioner, Radiology, Cardiology Why:  1:30 pm for hospital follow up and TCM, adjust lasix Contact information: [address] [place] [address] [phone]        [facility] [place] Follow up on 10/15/2016.   Specialty:  Cardiology Why:  3:30 for coumadin clinic Contact information: [address] [place] [address] [phone]         Discharge Instructions    Diet - low sodium heart healthy    Complete by:  As directed    Increase activity slowly    Complete by:  As directed       Discharge Medications   Current Discharge Medication List    START taking these medications   Details  digoxin (LANOXIN)  0.125 MG tablet Take 1 tablet (0.125 mg total) by mouth daily. Qty: 30 tablet, Refills: 3    metoprolol succinate (TOPROL-XL) 50 MG 24 hr tablet Take 1 tablet (50 mg total) by mouth daily. Take with or immediately following a meal. Qty: 30 tablet, Refills: 3    warfarin (COUMADIN) 5 MG tablet Take 1 tablet (5mg ) daily. Take an extra one-half tablet (7.5 mg) on Monday and Thursday. Qty: 45 tablet, Refills: 0      CONTINUE these medications which have CHANGED   Details  furosemide (LASIX) 40 MG tablet Take 1 tablet (40 mg total) by mouth 2 (two) times daily. Qty: 60 tablet, Refills: 6    potassium chloride SA (K-DUR,KLOR-CON) 20 MEQ tablet TAKE ONE TABLET BY MOUTH ONCE DAILY IN THE MORNING Qty: 30 tablet, Refills: 1      CONTINUE these medications which have NOT CHANGED   Details  allopurinol (  ZYLOPRIM) 300 MG tablet Take 1 tablet (300 mg total) by mouth daily. Qty: 30 tablet, Refills: 5      STOP taking these medications     carvedilol (COREG) 12.5 MG tablet           Outstanding Labs/Studies   Follow up in clinic with BMP for lasix regimen  Coumadin clinic follow up  Will need to be scheduled for repeat DCCV after 4 weeks of consistent anticoagulation on coumadin.  Repeat echocardiogram in three months.  Duration of Discharge Encounter   Greater than 30 minutes including physician time.  Signed, Roe Rutherford Rayshon Albaugh PA-C 10/09/2016, 3:15 PM

## 2016-10-09 NOTE — Anesthesia Procedure Notes (Signed)
Procedure Name: MAC Date/Time: 10/09/2016 11:07 AM Performed by: Izora Gala Pre-anesthesia Checklist: Patient identified, Emergency Drugs available, Suction available and Patient being monitored Patient Re-evaluated:Patient Re-evaluated prior to inductionOxygen Delivery Method: Nasal cannula Placement Confirmation: positive ETCO2

## 2016-10-09 NOTE — Progress Notes (Signed)
Pt and spouse educated on diet (pizza at bedside) regarding cardiac diet. Also educated on coumadin diet consistency, taking medication at 6pm every day, and getting finger pricked to level coumadin therapy. Pt and spouse both verbalize understanding and gave RN teach back on proper diet and medication regimen.

## 2016-10-09 NOTE — Anesthesia Preprocedure Evaluation (Signed)
Anesthesia Evaluation  Patient identified by MRN, date of birth, ID band Patient awake    Reviewed: Allergy & Precautions, NPO status , Patient's Chart, lab work & pertinent test results  Airway Mallampati: II  TM Distance: >3 FB Neck ROM: Full    Dental  (+) Teeth Intact   Pulmonary    breath sounds clear to auscultation       Cardiovascular  Rhythm:Irregular Rate:Normal     Neuro/Psych    GI/Hepatic   Endo/Other    Renal/GU      Musculoskeletal   Abdominal   Peds  Hematology   Anesthesia Other Findings   Reproductive/Obstetrics                             Anesthesia Physical Anesthesia Plan  ASA: III  Anesthesia Plan: General and MAC   Post-op Pain Management:    Induction: Intravenous  PONV Risk Score and Plan:   Airway Management Planned: Mask  Additional Equipment:   Intra-op Plan:   Post-operative Plan:   Informed Consent: I have reviewed the patients History and Physical, chart, labs and discussed the procedure including the risks, benefits and alternatives for the proposed anesthesia with the patient or authorized representative who has indicated his/her understanding and acceptance.     Plan Discussed with: CRNA and Anesthesiologist  Anesthesia Plan Comments:         Anesthesia Quick Evaluation

## 2016-10-09 NOTE — Telephone Encounter (Signed)
-----   Message from Dyane Dustman sent at 10/09/2016 10:46 AM EDT ----- Regarding: TCM  TCM appt/to be d/c'd 7/3/tg.Marland Kitchen  Thanks, Aurther Loft

## 2016-10-09 NOTE — Transfer of Care (Signed)
Immediate Anesthesia Transfer of Care Note  Patient: BRANTLEY WILEY III  Procedure(s) Performed: Procedure(s): TRANSESOPHAGEAL ECHOCARDIOGRAM (TEE) (N/A)  Patient Location: PACU and Endoscopy Unit  Anesthesia Type:MAC  Level of Consciousness: awake, alert , sedated and patient cooperative  Airway & Oxygen Therapy: Patient Spontanous Breathing and Patient connected to nasal cannula oxygen  Post-op Assessment: Report given to RN, Post -op Vital signs reviewed and stable, Patient moving all extremities and Patient moving all extremities X 4  Post vital signs: Reviewed and stable  Last Vitals:  Vitals:   10/09/16 1020 10/09/16 1146  BP: 107/60 92/72  Pulse: 90 (!) 101  Resp: 17 (!) 33  Temp: 36.8 C 36.7 C    Last Pain:  Vitals:   10/09/16 1146  TempSrc: Oral  PainSc:       Patients Stated Pain Goal: 0 (82/50/53 [id number])  Complications: No apparent anesthesia complications

## 2016-10-09 NOTE — Progress Notes (Signed)
Patient discharged home with spouse. Discharge instructions thoroughly went over by RN to patient and his wife. Patient states he does not have any questions and that this medication regimen is what he is use to. He states he will stay compliant with his medications now that he knows his heart is sicker than he thought. IV's removed, patient taken to lobby by NT.

## 2016-10-09 NOTE — H&P (View-Only) (Signed)
Progress Note  Patient Name: David [name] Date of Encounter: 10/09/2016  Primary Cardiologist: Dr. Diona Browner  Subjective   Slept well last night. Wants same sleeping pill at home.  Inpatient Medications    Scheduled Meds: . allopurinol  300 mg Oral Daily  . digoxin  0.125 mg Oral Daily  . furosemide  80 mg Intravenous Daily  . metoprolol succinate  50 mg Oral Daily  . potassium chloride  40 mEq Oral Daily  . sodium chloride flush  3 mL Intravenous Q12H  . sodium chloride flush  3 mL Intravenous Q12H  . warfarin  7.5 mg Oral q1800  . warfarin   Does not apply Once  . Warfarin - Pharmacist Dosing Inpatient   Does not apply q1800   Continuous Infusions: . sodium chloride 250 mL (10/06/16 2000)  . sodium chloride    . sodium chloride    . sodium chloride 10 mL/hr at 10/09/16 0745   PRN Meds: sodium chloride, acetaminophen, diazepam, ondansetron (ZOFRAN) IV, sodium chloride flush, sodium chloride flush, traZODone   Vital Signs    Vitals:   10/09/16 0400 10/09/16 0410 10/09/16 0800 10/09/16 0836  BP:   100/75 98/82  Pulse: (!) 29  98 97  Resp: (!) 26  (!) 22 20  Temp:  97.9 F (36.6 C)  98.3 F (36.8 C)  TempSrc:  Axillary  Oral  SpO2: (!) 80%  98% (!) 83%  Weight:      Height:        Intake/Output Summary (Last 24 hours) at 10/09/16 1007 Last data filed at 10/09/16 0800  Gross per 24 hour  Intake            722.5 ml  Output             4200 ml  Net          -3477.5 ml   Filed Weights   10/06/16 0351 10/07/16 0608 10/08/16 0343  Weight: 195 lb 8.8 oz (88.7 kg) 185 lb 6.5 oz (84.1 kg) 184 lb 12.8 oz (83.8 kg)     Physical Exam  Comfortable lying flat General: Well developed, well nourished, male appearing in no acute distress. Head: Normocephalic, atraumatic.  Neck: Supple without bruits, JVP normal. Lungs:  Resp regular and unlabored, CTA. Heart: irregular, S1, S2, no S3, S4, or murmur; no rub. Abdomen: Soft, non-tender, non-distended with  normoactive bowel sounds. No hepatomegaly. No rebound/guarding. No obvious abdominal masses. Extremities: No clubbing, cyanosis, no edema. Distal pedal pulses are 2+ bilaterally. Neuro: Alert and oriented X 3. Moves all extremities spontaneously. Psych: Normal affect.  Labs    Chemistry Recent Labs Lab 10/07/16 0519 10/08/16 0521 10/09/16 0516  NA 137 136 137  137  K 3.9 3.7 4.2  4.2  CL 96* 102 100*  100*  CO2 33* 28 30  28   GLUCOSE 89 113* 103*  103*  BUN 22* 18 17  18   CREATININE 1.49* 1.16 1.19  1.17  CALCIUM 8.3* 8.0* 8.5*  8.5*  GFRNONAA 52* >60 >60  >60  GFRAA 60* >60 >60  >60  ANIONGAP 8 6 7  9      Hematology Recent Labs Lab 10/07/16 0519 10/08/16 0521 10/09/16 0516  WBC 7.2 6.3 6.4  RBC 4.16* 3.92* 4.11*  HGB 12.6* 12.2* 12.3*  HCT 39.8 37.8* 39.5  MCV 95.7 96.4 96.1  MCH 30.3 31.1 29.9  MCHC 31.7 32.3 31.1  RDW 13.8 14.0 13.8  PLT 260 254 256  Radiology    No results found.   Telemetry    Rate-controlled Afib with PVCs - Personally Reviewed  ECG    No new tracings - Personally Reviewed   Cardiac Studies   TEE/DCCV 10/09/16: pending   Cardiac catheterization 10/02/2016: Echo documentation of ejection fraction of 15-20%.  Dilated left ventricle with an LVEDP of 27 mm.  Moderate right heart pressure elevation with moderate pulmonary hypertension.  Normal epicardial coronary arteries.  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  RECOMMENDATION: Aggressive medical therapy for a nonischemic dilated cardiomyopathy. Consider ARB therapy with future transition to entresto, spironolactone, carvedilol, and consideration for initial LifeVest with possible need for future prophylactic ICD implantation.  Echocardiogram 10/01/2016: Study Conclusions  - Left ventricle: The cavity size was mildly dilated. Wall thickness was normal. Systolic function was severely reduced. The estimated ejection  fraction was in the range of 15% to 20%. Diffuse hypokinesis. The study is not technically sufficient to allow evaluation of LV diastolic function. - Aortic valve: Mildly calcified annulus. Trileaflet; mildly thickened leaflets. There was moderate regurgitation. Valve area (VTI): 1.73 cm^2. Valve area (Vmax): 1.41 cm^2. - Mitral valve: Mildly calcified annulus. Normal thickness leaflets. - Left atrium: The atrium was severely dilated. - Right ventricle: The cavity size was mildly dilated. Systolic function was mildly reduced. - Right atrium: The atrium was severely dilated. - Tricuspid val/ve: There was moderate regurgitation. - Pulmonary arteries: Systolic pressure was mildly to moderately increased. PA peak pressure: 39 mm Hg (S). - Technically adequate study.  Patient Profile     56 y.o. male with a history of hypertension, nonischemic cardiomyopathy, persistent atrial fibrillation, noncompliance, now presenting with worsening LV dysfunction and systolic heart failure. Cardiac catheterization shows no significant CAD. LVEF had previously normalized on medical therapy, although has declined in the setting of noncompliance.  Assessment & Plan    1. Acute on chronic combined heart failure - pt may be dry - wt today pending, dry weight closer to 200 lbs - holding lasix today - may restart lower dose tomorrow - he is overall net negative 21 L with 4.7 L urine output yesterday (received 80 mg IV lasix yesterday) - no LifeVest at discharge - he is not a candidate for ICD at this time due to his medical noncompliance   2. Persistent Afib - rate-controlled on telemetry - scheduled for TEE/DCCV today - INR 2.07 (2.24), off heparin drip, dosing per pharmacy - toprol remains at 50 mg daily   3. Medication noncompliance - coumadin is a better choice for anticoagulation in a pt with medication noncompliance - discussed importance of taking medications to prevent progression  of CHF and recurrence of Afib RVR   Signed, Marcelino Duster , PA-C 10:07 AM 10/09/2016 Pager: [phone]  I have seen and examined the patient along with Marcelino Duster , PA-C.  I have reviewed the chart, notes and new data.  I agree with PA's note.  Key new complaints: breathing better, slept well Key examination changes: no overt hypervolemia. Borderline rate control at rest Key new findings / data: INR>2.0  PLAN: TEE DCCV today. Impressed upon him the critical importance of compliance with anticoagulation and INR monitoring, particularly in next 30 days. Discussed sodium restriction, alcohol reduction - preferably complete abstinence (did not seem eager to discuss this) and weight monitoring. I do not think he is an appropriate candidate for LifeVest. Cardioversion today. If uncomplicated, plan DC in next 24 hours, on oral diuretics with early follow up (  1-2 weeks with Dr. Diona Browner, labs in [place]).  Thurmon Fair, MD, [facility] [phone] 10/09/2016, 10:38 AM

## 2016-10-09 NOTE — Progress Notes (Signed)
Progress Note  Patient Name: David [name] Date of Encounter: 10/09/2016  Primary Cardiologist: Dr. Diona Browner  Subjective   Slept well last night. Wants same sleeping pill at home.  Inpatient Medications    Scheduled Meds: . allopurinol  300 mg Oral Daily  . digoxin  0.125 mg Oral Daily  . furosemide  80 mg Intravenous Daily  . metoprolol succinate  50 mg Oral Daily  . potassium chloride  40 mEq Oral Daily  . sodium chloride flush  3 mL Intravenous Q12H  . sodium chloride flush  3 mL Intravenous Q12H  . warfarin  7.5 mg Oral q1800  . warfarin   Does not apply Once  . Warfarin - Pharmacist Dosing Inpatient   Does not apply q1800   Continuous Infusions: . sodium chloride 250 mL (10/06/16 2000)  . sodium chloride    . sodium chloride    . sodium chloride 10 mL/hr at 10/09/16 0745   PRN Meds: sodium chloride, acetaminophen, diazepam, ondansetron (ZOFRAN) IV, sodium chloride flush, sodium chloride flush, traZODone   Vital Signs    Vitals:   10/09/16 0400 10/09/16 0410 10/09/16 0800 10/09/16 0836  BP:   100/75 98/82  Pulse: (!) 29  98 97  Resp: (!) 26  (!) 22 20  Temp:  97.9 F (36.6 C)  98.3 F (36.8 C)  TempSrc:  Axillary  Oral  SpO2: (!) 80%  98% (!) 83%  Weight:      Height:        Intake/Output Summary (Last 24 hours) at 10/09/16 1007 Last data filed at 10/09/16 0800  Gross per 24 hour  Intake            722.5 ml  Output             4200 ml  Net          -3477.5 ml   Filed Weights   10/06/16 0351 10/07/16 0608 10/08/16 0343  Weight: 195 lb 8.8 oz (88.7 kg) 185 lb 6.5 oz (84.1 kg) 184 lb 12.8 oz (83.8 kg)     Physical Exam  Comfortable lying flat General: Well developed, well nourished, male appearing in no acute distress. Head: Normocephalic, atraumatic.  Neck: Supple without bruits, JVP normal. Lungs:  Resp regular and unlabored, CTA. Heart: irregular, S1, S2, no S3, S4, or murmur; no rub. Abdomen: Soft, non-tender, non-distended with  normoactive bowel sounds. No hepatomegaly. No rebound/guarding. No obvious abdominal masses. Extremities: No clubbing, cyanosis, no edema. Distal pedal pulses are 2+ bilaterally. Neuro: Alert and oriented X 3. Moves all extremities spontaneously. Psych: Normal affect.  Labs    Chemistry Recent Labs Lab 10/07/16 0519 10/08/16 0521 10/09/16 0516  NA 137 136 137  137  K 3.9 3.7 4.2  4.2  CL 96* 102 100*  100*  CO2 33* 28 30  28   GLUCOSE 89 113* 103*  103*  BUN 22* 18 17  18   CREATININE 1.49* 1.16 1.19  1.17  CALCIUM 8.3* 8.0* 8.5*  8.5*  GFRNONAA 52* >60 >60  >60  GFRAA 60* >60 >60  >60  ANIONGAP 8 6 7  9      Hematology Recent Labs Lab 10/07/16 0519 10/08/16 0521 10/09/16 0516  WBC 7.2 6.3 6.4  RBC 4.16* 3.92* 4.11*  HGB 12.6* 12.2* 12.3*  HCT 39.8 37.8* 39.5  MCV 95.7 96.4 96.1  MCH 30.3 31.1 29.9  MCHC 31.7 32.3 31.1  RDW 13.8 14.0 13.8  PLT 260 254 256  Radiology    No results found.   Telemetry    Rate-controlled Afib with PVCs - Personally Reviewed  ECG    No new tracings - Personally Reviewed   Cardiac Studies   TEE/DCCV 10/09/16: pending   Cardiac catheterization 10/02/2016: Echo documentation of ejection fraction of 15-20%.  Dilated left ventricle with an LVEDP of 27 mm.  Moderate right heart pressure elevation with moderate pulmonary hypertension.  Normal epicardial coronary arteries.  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  RECOMMENDATION: Aggressive medical therapy for a nonischemic dilated cardiomyopathy. Consider ARB therapy with future transition to entresto, spironolactone, carvedilol, and consideration for initial LifeVest with possible need for future prophylactic ICD implantation.  Echocardiogram 10/01/2016: Study Conclusions  - Left ventricle: The cavity size was mildly dilated. Wall thickness was normal. Systolic function was severely reduced. The estimated ejection  fraction was in the range of 15% to 20%. Diffuse hypokinesis. The study is not technically sufficient to allow evaluation of LV diastolic function. - Aortic valve: Mildly calcified annulus. Trileaflet; mildly thickened leaflets. There was moderate regurgitation. Valve area (VTI): 1.73 cm^2. Valve area (Vmax): 1.41 cm^2. - Mitral valve: Mildly calcified annulus. Normal thickness leaflets. - Left atrium: The atrium was severely dilated. - Right ventricle: The cavity size was mildly dilated. Systolic function was mildly reduced. - Right atrium: The atrium was severely dilated. - Tricuspid val/ve: There was moderate regurgitation. - Pulmonary arteries: Systolic pressure was mildly to moderately increased. PA peak pressure: 39 mm Hg (S). - Technically adequate study.  Patient Profile     56 y.o. male with a history of hypertension, nonischemic cardiomyopathy, persistent atrial fibrillation, noncompliance, now presenting with worsening LV dysfunction and systolic heart failure. Cardiac catheterization shows no significant CAD. LVEF had previously normalized on medical therapy, although has declined in the setting of noncompliance.  Assessment & Plan    1. Acute on chronic combined heart failure - pt may be dry - wt today pending, dry weight closer to 200 lbs - holding lasix today - may restart lower dose tomorrow - he is overall net negative 21 L with 4.7 L urine output yesterday (received 80 mg IV lasix yesterday) - no LifeVest at discharge - he is not a candidate for ICD at this time due to his medical noncompliance   2. Persistent Afib - rate-controlled on telemetry - scheduled for TEE/DCCV today - INR 2.07 (2.24), off heparin drip, dosing per pharmacy - toprol remains at 50 mg daily   3. Medication noncompliance - coumadin is a better choice for anticoagulation in a pt with medication noncompliance - discussed importance of taking medications to prevent progression  of CHF and recurrence of Afib RVR   Signed, Marcelino Duster , PA-C 10:07 AM 10/09/2016 Pager: [phone]  I have seen and examined the patient along with Marcelino Duster , PA-C.  I have reviewed the chart, notes and new data.  I agree with PA's note.  Key new complaints: breathing better, slept well Key examination changes: no overt hypervolemia. Borderline rate control at rest Key new findings / data: INR>2.0  PLAN: TEE DCCV today. Impressed upon him the critical importance of compliance with anticoagulation and INR monitoring, particularly in next 30 days. Discussed sodium restriction, alcohol reduction - preferably complete abstinence (did not seem eager to discuss this) and weight monitoring. I do not think he is an appropriate candidate for LifeVest. Cardioversion today. If uncomplicated, plan DC in next 24 hours, on oral diuretics with early follow up (  1-2 weeks with Dr. Diona Browner, labs in [place]).  Thurmon Fair, MD, [facility] [phone] 10/09/2016, 10:38 AM

## 2016-10-09 NOTE — Interval H&P Note (Signed)
History and Physical Interval Note:  10/09/2016 10:49 AM  David [name]  has presented today for surgery, with the diagnosis of Afib  The various methods of treatment have been discussed with the patient and family. After consideration of risks, benefits and other options for treatment, the patient has consented to  Procedure(s): CARDIOVERSION (N/A) TRANSESOPHAGEAL ECHOCARDIOGRAM (TEE) (N/A) as a surgical intervention .  The patient's history has been reviewed, patient examined, no change in status, stable for surgery.  I have reviewed the patient's chart and labs.  Questions were answered to the patient's satisfaction.     Kristeen Miss

## 2016-10-09 NOTE — CV Procedure (Signed)
    Transesophageal Echocardiogram Note  OBI OGAZ [id number] 1960/05/03  Procedure: Transesophageal Echocardiogram Indications: atrial fib   Procedure Details Consent: Obtained Time Out: Verified patient identification, verified procedure, site/side was marked, verified correct patient position, special equipment/implants available, Radiology Safety Procedures followed,  medications/allergies/relevent history reviewed, required imaging and test results available.  Performed  Medications:  During this procedure the patient is administered a  Propofol drip  ( total of 125 mg ) for  sedation.  The patient's heart rate, blood pressure, and oxygen saturation are monitored continuously during the procedure.   Left Ventrical:  Severe LV dysfunction   Mitral Valve: mild MR   Aortic Valve: normal   Tricuspid Valve: normal   Pulmonic Valve: poorly visualized   Left Atrium/ Left atrial appendage: + LAA thrombus.  Extensive spontansous contrast .   Atrial septum: not visualized   Aorta:  Not visualized    Complications: No apparent complications Patient did tolerate procedure well.   Vesta Mixer, Montez Hageman., MD, [facility] 10/09/2016, 11:19 AM

## 2016-10-09 NOTE — Telephone Encounter (Signed)
Patient contacted regarding discharge from [facility] on 10/09/16.  Patient understands to follow up with provider Harriet Pho on 10/18/16 at 130 pm at [facility]. Patient understands discharge instructions? yes Patient understands medications and regiment? yes Patient understands to bring all medications to this visit? yes  I spoke  with wife, they are still waiting to be discharged,they will call back for any questions

## 2016-10-10 ENCOUNTER — Encounter (HOSPITAL_COMMUNITY): Payer: Self-pay | Admitting: Cardiovascular Disease

## 2016-10-11 ENCOUNTER — Encounter (HOSPITAL_COMMUNITY): Payer: Self-pay | Admitting: Cardiovascular Disease

## 2016-10-11 NOTE — Anesthesia Postprocedure Evaluation (Signed)
Anesthesia Post Note  Patient: David [name]  Procedure(s) Performed: Procedure(s) (LRB): TRANSESOPHAGEAL ECHOCARDIOGRAM (TEE) (N/A)     Patient location during evaluation: Endoscopy Anesthesia Type: MAC Level of consciousness: awake, awake and alert and oriented Pain management: pain level controlled Vital Signs Assessment: post-procedure vital signs reviewed and stable Respiratory status: spontaneous breathing, nonlabored ventilation and respiratory function stable Cardiovascular status: blood pressure returned to baseline Anesthetic complications: no    Last Vitals:  Vitals:   10/09/16 1200 10/09/16 1304  BP: 90/69 (!) 83/69  Pulse: 88 99  Resp: 17 (!) 28  Temp:      Last Pain:  Vitals:   10/09/16 1304  TempSrc: Axillary  PainSc:                  Ora Bollig COKER

## 2016-10-15 ENCOUNTER — Ambulatory Visit (INDEPENDENT_AMBULATORY_CARE_PROVIDER_SITE_OTHER): Payer: 59 | Admitting: *Deleted

## 2016-10-15 DIAGNOSIS — Z5181 Encounter for therapeutic drug level monitoring: Secondary | ICD-10-CM

## 2016-10-15 LAB — POCT INR: INR: 1.3

## 2016-10-18 ENCOUNTER — Encounter: Payer: Self-pay | Admitting: Adult Health

## 2016-10-18 ENCOUNTER — Encounter: Payer: Self-pay | Admitting: *Deleted

## 2016-10-18 ENCOUNTER — Ambulatory Visit (INDEPENDENT_AMBULATORY_CARE_PROVIDER_SITE_OTHER): Payer: 59 | Admitting: Adult Health

## 2016-10-18 ENCOUNTER — Ambulatory Visit: Payer: 59 | Admitting: Cardiology

## 2016-10-18 VITALS — BP 110/68 | HR 71 | Ht 71.0 in | Wt 174.0 lb

## 2016-10-18 DIAGNOSIS — I10 Essential (primary) hypertension: Secondary | ICD-10-CM

## 2016-10-18 DIAGNOSIS — I4819 Other persistent atrial fibrillation: Secondary | ICD-10-CM

## 2016-10-18 DIAGNOSIS — I43 Cardiomyopathy in diseases classified elsewhere: Secondary | ICD-10-CM | POA: Diagnosis not present

## 2016-10-18 DIAGNOSIS — I481 Persistent atrial fibrillation: Secondary | ICD-10-CM | POA: Diagnosis not present

## 2016-10-18 NOTE — Progress Notes (Signed)
Cardiology Office Note   Date:  10/18/2016   ID:  David [name], DOB 08/27/1960, MRN [id number]  PCP:  David Scarlet, MD  Cardiologist: David [name]  Chief Complaint  Patient presents with  . Atrial Fibrillation  . Cardiomyopathy  . Congestive Heart Failure      History of Present Illness: David [name] is a 56 y.o. male who presents for posthospitalization follow-up after admission for lower extremity edema, PND, and dyspnea. Other history includes ischemic  cardiomyopathy, persistent atrial fibrillation, hypertension, history of medical noncompliance.  The patient was diagnosed with non-ST elevation MI, acute on chronic systolic heart failure. The patient was scheduled for a cardiac catheterization due to significantly reduced EF of 15-20% with diffuse hypokinesis. Left and right heart catheterization revealed EF of 15-20%, moderate right pressure elevation moderate pulmonary hypertension. Coronary arteries were angiographically normal with no evidence of coronary artery disease.  Heart rate control continue to be an issue during hospitalization along with medical noncompliance. There was a plan TEE with cardioversion however the TEE revealed positive left atrial thrombus. The patient was continued on Coumadin therapy, with repeat cardioversion planned in one month if INR was consistently therapeutic.   Past Medical History:  Diagnosis Date  . Alcohol abuse   . Arthritis    Bilateral knees   . Atrial fibrillation (HCC)   . Chronic kidney disease, stage 2, mildly decreased GFR   . GERD (gastroesophageal reflux disease)   . Gout   . Nonischemic cardiomyopathy (HCC)    LVEF 20-25% improved to 50% on medical therapy  . TIA (transient ischemic attack)     Past Surgical History:  Procedure Laterality Date  . Arthroscopic knee surgery Right 1987  . CARDIOVERSION  10/08/2011   Procedure: CARDIOVERSION;  Surgeon: David Sidle, MD;  Location: [facility];  Service:  Cardiovascular;  Laterality: N/A;  Done in PACU  . CARDIOVERSION  01/10/2012   Procedure: CARDIOVERSION;  Surgeon: David Maw, MD;  Location: [facility] ENDOSCOPY;  Service: Cardiovascular;  Laterality: N/A;  . CARDIOVERSION N/A 11/17/2014   Procedure: CARDIOVERSION;  Surgeon: David Linden, MD;  Location: [facility];  Service: Cardiovascular;  Laterality: N/A;  . RIGHT/LEFT HEART CATH AND CORONARY ANGIOGRAPHY N/A 10/02/2016   Procedure: Right/Left Heart Cath and Coronary Angiography;  Surgeon: David Bihari, MD;  [facility]: [facility] INVASIVE CV LAB;  Service: Cardiovascular;  Laterality: N/A;  . TEE WITHOUT CARDIOVERSION N/A 11/17/2014   Procedure: TRANSESOPHAGEAL ECHOCARDIOGRAM (TEE);  Surgeon: David Linden, MD;  Location: [facility];  Service: Cardiovascular;  Laterality: N/A;  . TEE WITHOUT CARDIOVERSION N/A 10/09/2016   Procedure: TRANSESOPHAGEAL ECHOCARDIOGRAM (TEE);  Surgeon: David Hashimoto Deloris Ping, MD;  Location: [facility] ENDOSCOPY;  Service: Cardiovascular;  Laterality: N/A;  . TOTAL KNEE ARTHROPLASTY Left 01/16/2013   Procedure: TOTAL KNEE ARTHROPLASTY;  Surgeon: David Hearing, MD;  Location: [facility];  Service: Orthopedics;  Laterality: Left;     Current Outpatient Prescriptions  Medication Sig Dispense Refill  . allopurinol (ZYLOPRIM) 300 MG tablet Take 1 tablet (300 mg total) by mouth daily. 30 tablet 5  . digoxin (LANOXIN) 0.125 MG tablet Take 1 tablet (0.125 mg total) by mouth daily. 30 tablet 3  . furosemide (LASIX) 40 MG tablet Take 1 tablet (40 mg total) by mouth 2 (two) times daily. 60 tablet 6  . metoprolol succinate (TOPROL-XL) 50 MG 24 hr tablet Take 1 tablet (50 mg total) by mouth daily. Take with or immediately following a meal. 30 tablet 3  .  potassium chloride SA (K-DUR,KLOR-CON) 20 MEQ tablet TAKE ONE TABLET BY MOUTH ONCE DAILY IN THE MORNING 30 tablet 1  . warfarin (COUMADIN) 5 MG tablet Take 1 tablet (5mg ) daily. Take an extra one-half tablet (7.5 mg) on Monday and Thursday. 45  tablet 0   No current facility-administered medications for this visit.     Allergies:   Azithromycin    Social History:  The patient  reports that he has never smoked. He has quit using smokeless tobacco. His smokeless tobacco use included Snuff. He reports that he drinks about 0.6 oz of alcohol per week . He reports that he does not use drugs.   Family History:  The patient's family history includes Hypertension in his brother, mother, and sister.    ROS: All other systems are reviewed and negative. Unless otherwise mentioned in H&P    PHYSICAL EXAM: VS:  BP 110/68   Pulse 71   Ht 5\' 11"  (1.803 m)   Wt 174 lb (78.9 kg)   SpO2 98%   BMI 24.27 kg/m  , BMI Body mass index is 24.27 kg/m. GEN: Well nourished, well developed, in no acute distress  HEENT: normal  Neck: no JVD, carotid bruits, or masses Cardiac: IRRR; no murmurs, rubs, or gallops,no edema  Respiratory:  clear to auscultation bilaterally, normal work of breathing GI: soft, nontender, nondistended, + BS MS: no deformity or atrophy  Skin: warm and dry, no rash Neuro:  Strength and sensation are intact Psych: euthymic mood, full affect   Recent Labs: 10-12-2016: ALT 73; B Natriuretic Peptide 1,287.0 10/09/2016: BUN 17; BUN 18; Creatinine, Ser 1.19; Creatinine, Ser 1.17; Hemoglobin 12.3; Platelets 256; Potassium 4.2; Potassium 4.2; Sodium 137; Sodium 137    Lipid Panel    Component Value Date/Time   CHOL 104 10/03/2016 0744   TRIG 34 10/03/2016 0744   HDL 30 (L) 10/03/2016 0744   CHOLHDL 3.5 10/03/2016 0744   VLDL 7 10/03/2016 0744   LDLCALC 67 10/03/2016 0744      Wt Readings from Last 3 Encounters:  10/18/16 174 lb (78.9 kg)  10/08/16 184 lb 12.8 oz (83.8 kg)  09/25/16 196 lb (88.9 kg)      Other studies Reviewed: Echocardiogram 10/12/16 Left ventricle: The cavity size was mildly dilated. Wall   thickness was normal. Systolic function was severely reduced. The   estimated ejection fraction was in  the range of 15% to 20%.   Diffuse hypokinesis. The study is not technically sufficient to   allow evaluation of LV diastolic function. - Aortic valve: Mildly calcified annulus. Trileaflet; mildly   thickened leaflets. There was moderate regurgitation. Valve area   (VTI): 1.73 cm^2. Valve area (Vmax): 1.41 cm^2. - Mitral valve: Mildly calcified annulus. Normal thickness leaflets  ASSESSMENT AND PLAN:  1. Left atrial thrombus: This has prohibited DCCV while hospitalized. He remains on coumadin therapy, and is being seen in our coumadin clinic for dosing. Repeat TEE is planned after 30 days of therapy.   2. Atrial fib: Heart rate is well controlled on metoprolol and digoxin. No changes in regimen. He is to be planned for DCCV in 3-4 weeks. He has appointment with Dr. Diona [name] on  11/05/2016. Can be planned on that visit. He requests that Dr. Diona Browner do the procedure.   3. Chronic Systolic CHF with ICM: He remains on diuretic therapy. No evidence of decompensation. Weight is stable.    Current medicines are reviewed at length with the patient today.    Labs/ tests  ordered today include:   Bettey Mare. Liborio Nixon, ANP, AACC   10/18/2016 1:52 PM     [facility] [address], [place], [place] [address] Phone: [phone]; Fax: [phone]

## 2016-10-18 NOTE — Patient Instructions (Signed)
Your physician recommends that you schedule a follow-up appointment with Dr. Diona Browner   Your physician recommends that you continue on your current medications as directed. Please refer to the Current Medication list given to you today.  If you need a refill on your cardiac medications before your next appointment, please call your pharmacy.  You and your wife have been given work letters   Thank you for choosing [facility]!

## 2016-10-22 ENCOUNTER — Ambulatory Visit (INDEPENDENT_AMBULATORY_CARE_PROVIDER_SITE_OTHER): Payer: 59 | Admitting: *Deleted

## 2016-10-22 DIAGNOSIS — Z5181 Encounter for therapeutic drug level monitoring: Secondary | ICD-10-CM

## 2016-10-22 LAB — POCT INR: INR: 1.3

## 2016-10-24 ENCOUNTER — Ambulatory Visit (INDEPENDENT_AMBULATORY_CARE_PROVIDER_SITE_OTHER): Payer: 59 | Admitting: *Deleted

## 2016-10-24 DIAGNOSIS — Z5181 Encounter for therapeutic drug level monitoring: Secondary | ICD-10-CM

## 2016-10-24 LAB — POCT INR: INR: 1.8

## 2016-10-29 ENCOUNTER — Ambulatory Visit (INDEPENDENT_AMBULATORY_CARE_PROVIDER_SITE_OTHER): Payer: 59 | Admitting: *Deleted

## 2016-10-29 DIAGNOSIS — Z5181 Encounter for therapeutic drug level monitoring: Secondary | ICD-10-CM | POA: Diagnosis not present

## 2016-10-29 LAB — POCT INR: INR: 3.5

## 2016-11-05 ENCOUNTER — Ambulatory Visit (INDEPENDENT_AMBULATORY_CARE_PROVIDER_SITE_OTHER): Payer: 59 | Admitting: *Deleted

## 2016-11-05 DIAGNOSIS — Z5181 Encounter for therapeutic drug level monitoring: Secondary | ICD-10-CM | POA: Diagnosis not present

## 2016-11-05 LAB — POCT INR: INR: 2.4

## 2016-11-05 NOTE — Progress Notes (Signed)
Cardiology Office Note  Date: 11/06/2016   ID: [name], DOB [birth year], MRN [id number]  PCP: Salley Scarlet, MD  Primary Cardiologist: Nona Dell, MD   Chief Complaint  Patient presents with  . Atrial Fibrillation  . Cardiomyopathy    History of Present Illness: David [name] is a 56 y.o. male seen recently in the office by Ms. Lawrence DNP following hospitalization. I reviewed the records. He has a history of nonischemic cardiomyopathy with worsening LVEF in the setting of medication noncompliance and rapid atrial fibrillation. Recent cardiac catheterization revealed normal coronary arteries. He was to undergo a TEE guided cardioversion, however had a left atrial appendage thrombus and has been placed back on Coumadin with deferred cardioversion.  He presents today with his wife for follow-up. I reviewed his records and discussed the situation with them in detail. My concern about pursuing a follow-up cardioversion, is that we have already gone down this path a few times in the past, and he always returns to atrial fibrillation, even with antiarrhythmic therapy. I think the chances of making a long-term positive impact by pursuing a cardioversion are fairly low. Also, he had actually tolerated atrial fibrillation well when he was taking his medicines regularly and his heart rate was controlled.  He states that he is pursuing disability determinations, he is worried about returning to work. Feels weak. I told him that he would keep him out of work until next clinical assessment, but I am hopeful that he will actually improve as he has done in the past when compliant with medications. His LVEF had actually normalized with this strategy in the past.  INR on 7/9 was 1.3, 7/16 was 1.3, 7/18 was 1.8, 7/23 was 3.5, and 7/30 was 2.4  Past Medical History:  Diagnosis Date  . Alcohol abuse   . Arthritis    Bilateral knees   . Atrial fibrillation (HCC)   . Chronic  kidney disease, stage 2, mildly decreased GFR   . GERD (gastroesophageal reflux disease)   . Gout   . Nonischemic cardiomyopathy (HCC)    LVEF 20-25% improved to 50% on medical therapy  . TIA (transient ischemic attack)     Past Surgical History:  Procedure Laterality Date  . Arthroscopic knee surgery Right 1987  . CARDIOVERSION  10/08/2011   Procedure: CARDIOVERSION;  Surgeon: Jonelle Sidle, MD;  Location: [facility];  Service: Cardiovascular;  Laterality: N/A;  Done in PACU  . CARDIOVERSION  01/10/2012   Procedure: CARDIOVERSION;  Surgeon: Marinus Maw, MD;  Location: [facility] ENDOSCOPY;  Service: Cardiovascular;  Laterality: N/A;  . CARDIOVERSION N/A 11/17/2014   Procedure: CARDIOVERSION;  Surgeon: Laqueta Linden, MD;  Location: [facility];  Service: Cardiovascular;  Laterality: N/A;  . RIGHT/LEFT HEART CATH AND CORONARY ANGIOGRAPHY N/A 10/02/2016   Procedure: Right/Left Heart Cath and Coronary Angiography;  Surgeon: Lennette Bihari, MD;  Location: [facility] INVASIVE CV LAB;  Service: Cardiovascular;  Laterality: N/A;  . TEE WITHOUT CARDIOVERSION N/A 11/17/2014   Procedure: TRANSESOPHAGEAL ECHOCARDIOGRAM (TEE);  Surgeon: Laqueta Linden, MD;  Location: [facility];  Service: Cardiovascular;  Laterality: N/A;  . TEE WITHOUT CARDIOVERSION N/A 10/09/2016   Procedure: TRANSESOPHAGEAL ECHOCARDIOGRAM (TEE);  Surgeon: Elease Hashimoto Deloris Ping, MD;  Location: [facility] ENDOSCOPY;  Service: Cardiovascular;  Laterality: N/A;  . TOTAL KNEE ARTHROPLASTY Left 01/16/2013   Procedure: TOTAL KNEE ARTHROPLASTY;  Surgeon: Vickki Hearing, MD;  Location: [facility];  Service: Orthopedics;  Laterality: Left;  Current Outpatient Prescriptions  Medication Sig Dispense Refill  . allopurinol (ZYLOPRIM) 300 MG tablet Take 1 tablet (300 mg total) by mouth daily. 30 tablet 5  . digoxin (LANOXIN) 0.125 MG tablet Take 1 tablet (0.125 mg total) by mouth daily. 30 tablet 3  . furosemide (LASIX) 40 MG tablet Take 1 tablet (40 mg total) by mouth 2  (two) times daily. 60 tablet 6  . metoprolol succinate (TOPROL-XL) 50 MG 24 hr tablet Take 1 tablet (50 mg total) by mouth daily. Take with or immediately following a meal. 30 tablet 3  . potassium chloride SA (K-DUR,KLOR-CON) 20 MEQ tablet TAKE ONE TABLET BY MOUTH ONCE DAILY IN THE MORNING 30 tablet 1  . warfarin (COUMADIN) 5 MG tablet Take 1 tablet (5mg ) daily. Take an extra one-half tablet (7.5 mg) on Monday and Thursday. 45 tablet 0   No current facility-administered medications for this visit.    Allergies:  Azithromycin   Social History: The patient  reports that he has never smoked. He has quit using smokeless tobacco. His smokeless tobacco use included Snuff. He reports that he drinks about 0.6 oz of alcohol per week . He reports that he does not use drugs.   ROS:  Please see the history of present illness. Otherwise, complete review of systems is positive for fatigue.  All other systems are reviewed and negative.   Physical Exam: VS:  BP 102/60 (BP Location: Right Arm)   Pulse 90   Ht 5\' 11"  (1.803 m)   Wt 180 lb (81.6 kg)   SpO2 98%   BMI 25.10 kg/m , BMI Body mass index is 25.1 kg/m.  Wt Readings from Last 3 Encounters:  11/06/16 180 lb (81.6 kg)  10/18/16 174 lb (78.9 kg)  10/08/16 184 lb 12.8 oz (83.8 kg)    General: Appears comfortable at rest. HEENT: Conjunctiva and lids normal, oropharynx clear. Neck: Supple, no elevated JVP or carotid bruits, no thyromegaly. Lungs: Clear to auscultation, nonlabored breathing at rest. Cardiac: Irregularly irregular, no S3 or significant systolic murmur, no pericardial rub. Abdomen: Soft, nontender, bowel sounds present, no guarding or rebound. Extremities: No pitting edema, distal pulses 2+. Skin: Warm and dry. Musculoskeletal: No kyphosis. Neuropsychiatric: Alert and oriented 3, affect appropriate.  ECG: I personally reviewed the tracing from 10/03/2016 which showed rate-controlled atrial fibrillation with diffuse nonspecific  ST-T changes.  Recent Labwork: 10/01/2016: ALT 73; AST 38; B Natriuretic Peptide 1,287.0 10/09/2016: BUN 17; BUN 18; Creatinine, Ser 1.19; Creatinine, Ser 1.17; Hemoglobin 12.3; Platelets 256; Potassium 4.2; Potassium 4.2; Sodium 137; Sodium 137     Component Value Date/Time   CHOL 104 10/03/2016 0744   TRIG 34 10/03/2016 0744   HDL 30 (L) 10/03/2016 0744   CHOLHDL 3.5 10/03/2016 0744   VLDL 7 10/03/2016 0744   LDLCALC 67 10/03/2016 0744    Other Studies Reviewed Today:  TEE 10/09/2016: Study Conclusions  - Left ventricle: Systolic function was severely reduced. The   estimated ejection fraction was in the range of 20% to 25%. - Aortic valve: Bicuspid. - Mitral valve: There was mild regurgitation. - Left atrium: There was a medium-sizedthrombus. There was   spontaneous echo contrast (&quot;smoke&quot;). - Tricuspid valve: There was moderate regurgitation.  Impressions:  - There is evidence of thrombus in the left atrial appendage.  Cardiac catheterization 10/02/2016: Echo documentation of ejection fraction of 15-20%.  Dilated left ventricle with an LVEDP of 27 mm.  Moderate right heart pressure elevation with moderate pulmonary hypertension.  Normal epicardial  coronary arteries.  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  Assessment and Plan:  1. Persistent atrial fibrillation, heart rate is better controlled and recent INR therapeutic. He states that he has been compliant with his medications, and I reinforced this with him again today. As discussed above, it is not clear to me that pursuing elective cardioversion at this point would make much positive impact in the long run. We have already tried this strategy before and he has always returned to atrial fibrillation even with antiarrhythmic therapy. With strategy of heart rate control and anticoagulation he actually did well previously when compliant with his medications. We will bring him back  to the office for clinical reassessment in the next few weeks. He will be out of work until that time, I am hopeful that he will show improvement however.  2. Nonischemic cardiomyopathy, normal coronary arteries by recent cardiac catheterization. In the past he had normalized LVEF when compliant with medical therapy, and I hope this will be the case again. I reinforced compliance with him again and we will eventually plan to reassess by echocardiography at some point in the next 6-8 weeks.  3. Prior history of alcohol abuse, reports cessation.  4. Left atrial appendage thrombus by recent TEE. Reinforced compliance with anticoagulation.  Current medicines were reviewed with the patient today.  Disposition: Follow-up in 2-3 weeks.  Signed, Jonelle Sidle, MD, [facility] 11/06/2016 4:29 PM    [facility] at [facility] [address], [place], [place] [address] Phone: [phone]; Fax: [phone]

## 2016-11-06 ENCOUNTER — Encounter: Payer: Self-pay | Admitting: Cardiology

## 2016-11-06 ENCOUNTER — Ambulatory Visit (INDEPENDENT_AMBULATORY_CARE_PROVIDER_SITE_OTHER): Payer: 59 | Admitting: Cardiology

## 2016-11-06 VITALS — BP 102/60 | HR 90 | Ht 71.0 in | Wt 180.0 lb

## 2016-11-06 DIAGNOSIS — I481 Persistent atrial fibrillation: Secondary | ICD-10-CM | POA: Diagnosis not present

## 2016-11-06 DIAGNOSIS — Z87898 Personal history of other specified conditions: Secondary | ICD-10-CM | POA: Diagnosis not present

## 2016-11-06 DIAGNOSIS — F1011 Alcohol abuse, in remission: Secondary | ICD-10-CM

## 2016-11-06 DIAGNOSIS — I4819 Other persistent atrial fibrillation: Secondary | ICD-10-CM

## 2016-11-06 DIAGNOSIS — I513 Intracardiac thrombosis, not elsewhere classified: Secondary | ICD-10-CM | POA: Diagnosis not present

## 2016-11-06 DIAGNOSIS — I428 Other cardiomyopathies: Secondary | ICD-10-CM

## 2016-11-06 NOTE — Patient Instructions (Signed)
Your physician recommends that you schedule a follow-up appointment in: 2-3  weeks with Dr Diona Browner    Your physician recommends that you continue on your current medications as directed. Please refer to the Current Medication list given to you today.     Please remain off work until follow up visit.     Thank you for choosing [facility] !

## 2016-11-12 ENCOUNTER — Ambulatory Visit (INDEPENDENT_AMBULATORY_CARE_PROVIDER_SITE_OTHER): Payer: 59 | Admitting: *Deleted

## 2016-11-12 DIAGNOSIS — Z5181 Encounter for therapeutic drug level monitoring: Secondary | ICD-10-CM | POA: Diagnosis not present

## 2016-11-12 LAB — POCT INR: INR: 1.3

## 2016-11-21 ENCOUNTER — Ambulatory Visit (INDEPENDENT_AMBULATORY_CARE_PROVIDER_SITE_OTHER): Payer: 59 | Admitting: *Deleted

## 2016-11-21 DIAGNOSIS — Z5181 Encounter for therapeutic drug level monitoring: Secondary | ICD-10-CM

## 2016-11-21 DIAGNOSIS — I4891 Unspecified atrial fibrillation: Secondary | ICD-10-CM

## 2016-11-21 LAB — POCT INR: INR: 1.8

## 2016-11-27 ENCOUNTER — Ambulatory Visit (INDEPENDENT_AMBULATORY_CARE_PROVIDER_SITE_OTHER): Payer: 59 | Admitting: Cardiology

## 2016-11-27 ENCOUNTER — Encounter: Payer: Self-pay | Admitting: Cardiology

## 2016-11-27 ENCOUNTER — Ambulatory Visit (INDEPENDENT_AMBULATORY_CARE_PROVIDER_SITE_OTHER): Payer: 59 | Admitting: *Deleted

## 2016-11-27 DIAGNOSIS — I481 Persistent atrial fibrillation: Secondary | ICD-10-CM | POA: Diagnosis not present

## 2016-11-27 DIAGNOSIS — Z7901 Long term (current) use of anticoagulants: Secondary | ICD-10-CM

## 2016-11-27 DIAGNOSIS — Z0389 Encounter for observation for other suspected diseases and conditions ruled out: Secondary | ICD-10-CM

## 2016-11-27 DIAGNOSIS — I428 Other cardiomyopathies: Secondary | ICD-10-CM | POA: Diagnosis not present

## 2016-11-27 DIAGNOSIS — I4819 Other persistent atrial fibrillation: Secondary | ICD-10-CM

## 2016-11-27 DIAGNOSIS — IMO0001 Reserved for inherently not codable concepts without codable children: Secondary | ICD-10-CM

## 2016-11-27 DIAGNOSIS — I5022 Chronic systolic (congestive) heart failure: Secondary | ICD-10-CM | POA: Diagnosis not present

## 2016-11-27 DIAGNOSIS — Z5181 Encounter for therapeutic drug level monitoring: Secondary | ICD-10-CM | POA: Diagnosis not present

## 2016-11-27 LAB — POCT INR: INR: 3.6

## 2016-11-27 MED ORDER — FUROSEMIDE 40 MG PO TABS: 40.0000 mg | ORAL_TABLET | Freq: Every day | ORAL | 6 refills | Status: DC

## 2016-11-27 NOTE — Assessment & Plan Note (Signed)
Pt appears compensated. I will decrease his Lasix to 40 mg daily

## 2016-11-27 NOTE — Assessment & Plan Note (Signed)
CAF- rate controlled 

## 2016-11-27 NOTE — Assessment & Plan Note (Signed)
Coumadin Rx- INR goal 2-3

## 2016-11-27 NOTE — Progress Notes (Signed)
11/27/2016 David [name]   May 21, 1960  [id number]  Primary Physician Salley Scarlet, MD Primary Cardiologist: Dr Diona Browner  HPI:  56 y/o male with a history of NICM CHF, and CAF. He is on Coumadin and had an LVT on TEE in June 2018 (not cardioverted then) when he was admitted with CHF after he had been out of his medications. The pt has had rate related cardiomyopathy in the past, also related to medication non compliance. Dr Diona Browner decided not to pursue cardioversion as the pt's LVF has improved in the past with rate control.   He is in the office today for follow up. He had been out of work (worked at a [employer]) because he physically couldn't do it with his AF and chronic CHF. He is now working doing apartment renovations and he says this is a much better situation for him. He denies orthopnea or LE edema. His wife accompanied him and says she watches his diet carefully.    Current Outpatient Prescriptions  Medication Sig Dispense Refill  . allopurinol (ZYLOPRIM) 300 MG tablet Take 1 tablet (300 mg total) by mouth daily. 30 tablet 5  . digoxin (LANOXIN) 0.125 MG tablet Take 1 tablet (0.125 mg total) by mouth daily. 30 tablet 3  . furosemide (LASIX) 40 MG tablet Take 1 tablet (40 mg total) by mouth daily. 30 tablet 6  . metoprolol succinate (TOPROL-XL) 50 MG 24 hr tablet Take 1 tablet (50 mg total) by mouth daily. Take with or immediately following a meal. 30 tablet 3  . potassium chloride SA (K-DUR,KLOR-CON) 20 MEQ tablet TAKE ONE TABLET BY MOUTH ONCE DAILY IN THE MORNING 30 tablet 1  . warfarin (COUMADIN) 5 MG tablet Take 1 tablet (5mg ) daily. Take an extra one-half tablet (7.5 mg) on Monday and Thursday. 45 tablet 0   No current facility-administered medications for this visit.     Allergies  Allergen Reactions  . Azithromycin Itching    Past Medical History:  Diagnosis Date  . Alcohol abuse   . Arthritis    Bilateral knees   . Atrial fibrillation (HCC)     . Chronic kidney disease, stage 2, mildly decreased GFR   . GERD (gastroesophageal reflux disease)   . Gout   . Nonischemic cardiomyopathy (HCC)    LVEF 20-25% improved to 50% on medical therapy  . TIA (transient ischemic attack)     Social History   Social History  . Marital status: Married    Spouse name: N/A  . Number of children: N/A  . Years of education: N/A   Occupational History  . Not on file.   Social History Main Topics  . Smoking status: Never Smoker  . Smokeless tobacco: Former Neurosurgeon    Types: Snuff     Comment: quit 1980's  . Alcohol use 0.6 oz/week    1 Cans of beer per week     Comment: last beer 01/12/2013 but sts i am quitting  . Drug use: No     Comment: last time 2 years ago  . Sexual activity: Yes    Partners: Male    Birth control/ protection: None   Other Topics Concern  . Not on file   Social History Narrative   Lives in [place] with his sister   Carmela Hurt off from [employer].     Family History  Problem Relation Age of Onset  . Hypertension Mother   . Hypertension Sister   . Hypertension Brother  Review of Systems: General: negative for chills, fever, night sweats or weight changes.  Cardiovascular: negative for chest pain, dyspnea on exertion, edema, orthopnea, palpitations, paroxysmal nocturnal dyspnea or shortness of breath Dermatological: negative for rash Respiratory: negative for cough or wheezing Urologic: negative for hematuria Abdominal: negative for nausea, vomiting, diarrhea, bright red blood per rectum, melena, or hematemesis Neurologic: negative for visual changes, syncope, or dizziness All other systems reviewed and are otherwise negative except as noted above.    Blood pressure 118/64, pulse (!) 50, height 5\' 11"  (1.803 m), weight 178 lb (80.7 kg), SpO2 98 %.  General appearance: alert, cooperative and no distress Neck: no JVD Lungs: clear to auscultation bilaterally Heart: irregularly irregular  rhythm Extremities: no edema Skin: Skin color, texture, turgor normal. No rashes or lesions Neurologic: Grossly normal   ASSESSMENT AND PLAN:   Chronic systolic (congestive) heart failure (HCC) Pt appears compensated. I will decrease his Lasix to 40 mg daily  Atrial fibrillation (HCC) CAF- rate controlled  NICM (nonischemic cardiomyopathy) (HCC) EF 15-20% June 2018 Normal coronaries June 2018  Long term (current) use of anticoagulants Coumadin Rx- INR goal 2-3   PLAN  Same Rx except decrease Lasix to 40 mg daily. F/U with Dr Diona Browner in 3 months, consider f/u echo then.  Corine Shelter PA-C 11/27/2016 4:02 PM

## 2016-11-27 NOTE — Assessment & Plan Note (Signed)
EF 15-20% June 2018 Normal coronaries June 2018

## 2016-11-27 NOTE — Patient Instructions (Addendum)
Medication Instructions:  DECREASE LASIX TO 40 MG DAILY  Coumadin- Take 5 mg tonight , then take 7.5 mg daily  Labwork: NONE  Testing/Procedures: NONE  Follow-Up: Your physician recommends that you schedule a follow-up appointment in: 3 MONTHS    Any Other Special Instructions Will Be Listed Below (If Applicable).     If you need a refill on your cardiac medications before your next appointment, please call your pharmacy.

## 2016-12-05 ENCOUNTER — Ambulatory Visit (INDEPENDENT_AMBULATORY_CARE_PROVIDER_SITE_OTHER): Payer: 59 | Admitting: *Deleted

## 2016-12-05 DIAGNOSIS — Z5181 Encounter for therapeutic drug level monitoring: Secondary | ICD-10-CM | POA: Diagnosis not present

## 2016-12-05 DIAGNOSIS — I4891 Unspecified atrial fibrillation: Secondary | ICD-10-CM | POA: Diagnosis not present

## 2016-12-05 LAB — POCT INR: INR: 2.2

## 2017-01-15 ENCOUNTER — Other Ambulatory Visit: Payer: Self-pay

## 2017-01-15 MED ORDER — POTASSIUM CHLORIDE CRYS ER 20 MEQ PO TBCR: EXTENDED_RELEASE_TABLET | ORAL | 1 refills | Status: DC

## 2017-01-21 ENCOUNTER — Other Ambulatory Visit: Payer: Self-pay

## 2017-01-21 MED ORDER — POTASSIUM CHLORIDE CRYS ER 20 MEQ PO TBCR: EXTENDED_RELEASE_TABLET | ORAL | 1 refills | Status: DC

## 2017-02-09 ENCOUNTER — Other Ambulatory Visit: Payer: Self-pay | Admitting: Cardiovascular Disease

## 2017-02-12 ENCOUNTER — Other Ambulatory Visit: Payer: Self-pay

## 2017-02-12 MED ORDER — DIGOXIN 125 MCG PO TABS: 0.1250 mg | ORAL_TABLET | Freq: Every day | ORAL | 6 refills | Status: DC

## 2017-02-12 NOTE — Telephone Encounter (Signed)
Refilled lanoxin per fax request

## 2017-02-25 ENCOUNTER — Other Ambulatory Visit: Payer: Self-pay | Admitting: Cardiovascular Disease

## 2017-03-01 ENCOUNTER — Other Ambulatory Visit: Payer: Self-pay | Admitting: Cardiovascular Disease

## 2017-03-05 ENCOUNTER — Telehealth: Payer: Self-pay | Admitting: Cardiology

## 2017-03-05 NOTE — Telephone Encounter (Signed)
I cannot find where patient is on lisinopril anymore. I will message last provider to clarify. It looks like it was d/c'd on 10/01/16

## 2017-03-05 NOTE — Telephone Encounter (Signed)
Per phone call from pt's wife--pt is needing his lisinopril refilled, needs to go to [facility]

## 2017-03-05 NOTE — Telephone Encounter (Signed)
I have reviewed his chart as well.  He was last seen by Corine Shelter PA in August.  At that time he was not on lisinopril.  I do not see where he was ever restarted on that by our practice.  Will not restart any lisinopril until he is seen.  He does have cardiomyopathy with reduced EF and it would be beneficial for him to be on it , however , we will not start it without him being seen.  He was to follow-up in 3 months with Dr. Diona Browner after August appointment.

## 2017-03-05 NOTE — Telephone Encounter (Signed)
LM on pt's private VM Dr.Lawrence's message.He has apt in 5 days on 03/12/17 with Dr Diona Browner

## 2017-03-11 ENCOUNTER — Ambulatory Visit: Payer: 59 | Admitting: Cardiology

## 2017-03-22 ENCOUNTER — Telehealth: Payer: Self-pay | Admitting: *Deleted

## 2017-03-22 ENCOUNTER — Other Ambulatory Visit: Payer: Self-pay | Admitting: Cardiovascular Disease

## 2017-03-22 NOTE — Telephone Encounter (Signed)
Called pt d/t refill on Warfarin received however pt has not been seen since August 2018. Advised pt that he needs an appt & no refill can be sent until he comes to an appt. Pt stated he would make his own appt & advise pt that I cannot send in any meds without him being seen or making an appt until he can come in & he verbalized understanding.

## 2017-04-22 NOTE — Progress Notes (Signed)
Cardiology Office Note  Date: 04/24/2017   ID: David [name], DOB 30-Nov-1960, MRN [id number]  PCP: Salley Scarlet, MD  Primary Cardiologist: Nona Dell, MD   Chief Complaint  Patient presents with  . Atrial Fibrillation  . Cardiomyopathy    History of Present Illness: David [name] is a 57 y.o. male last seen by Mr. Lenn Cal in August 2018. He has not followed up since that time. Presents today with his wife. States that he has had some lightheadedness intermittently, we went over his medications and he is no longer on lisinopril or Toprol-XL. He states that he has been taking Coumadin regularly, but has not followed up to have a PT/INR. Fortunately, reports no bleeding episodes. He is not working at this time.  We discussed getting a follow-up echocardiogram previously to reassess LVEF with nonischemic cardiomyopathy that is suspected to be tachycardia-mediated. He did not follow-up for this study.  Today we discussed the importance of adequate heart rate control of his atrial fibrillation and also compliance with follow-up on Coumadin. We will get a PT/INR checked today.  Past Medical History:  Diagnosis Date  . Alcohol abuse   . Arthritis    Bilateral knees   . Atrial fibrillation (HCC)   . Chronic kidney disease, stage 2, mildly decreased GFR   . GERD (gastroesophageal reflux disease)   . Gout   . Nonischemic cardiomyopathy (HCC)    LVEF 20-25% improved to 50% on medical therapy  . TIA (transient ischemic attack)     Past Surgical History:  Procedure Laterality Date  . Arthroscopic knee surgery Right 1987  . CARDIOVERSION  10/08/2011   Procedure: CARDIOVERSION;  Surgeon: Jonelle Sidle, MD;  Location: [facility];  Service: Cardiovascular;  Laterality: N/A;  Done in PACU  . CARDIOVERSION  01/10/2012   Procedure: CARDIOVERSION;  Surgeon: Marinus Maw, MD;  Location[facility] ENDOSCOPY;  Service: Cardiovascular;  Laterality: N/A;  . CARDIOVERSION N/A  11/17/2014   Procedure: CARDIOVERSION;  Surgeon: Laqueta Linden, MD;  Location: [facility];  Service: Cardiovascular;  Laterality: N/A;  . RIGHT/LEFT HEART CATH AND CORONARY ANGIOGRAPHY N/A 10/02/2016   Procedure: Right/Left Heart Cath and Coronary Angiography;  Surgeon: David Bihari, MD;  Location: [facility] INVASIVE CV LAB;  Service: Cardiovascular;  Laterality: N/A;  . TEE WITHOUT CARDIOVERSION N/A 11/17/2014   Procedure: TRANSESOPHAGEAL ECHOCARDIOGRAM (TEE);  Surgeon: Laqueta Linden, MD;  Location: [facility];  Service: Cardiovascular;  Laterality: N/A;  . TEE WITHOUT CARDIOVERSION N/A 10/09/2016   Procedure: TRANSESOPHAGEAL ECHOCARDIOGRAM (TEE);  Surgeon: Elease Hashimoto Deloris Ping, MD;  Location: [facility] ENDOSCOPY;  Service: Cardiovascular;  Laterality: N/A;  . TOTAL KNEE ARTHROPLASTY Left 01/16/2013   Procedure: TOTAL KNEE ARTHROPLASTY;  Surgeon: Vickki Hearing, MD;  Location: [facility];  Service: Orthopedics;  Laterality: Left;    Current Outpatient Medications  Medication Sig Dispense Refill  . allopurinol (ZYLOPRIM) 300 MG tablet Take 1 tablet (300 mg total) by mouth daily. 30 tablet 5  . digoxin (LANOXIN) 0.125 MG tablet Take 1 tablet (0.125 mg total) daily by mouth. 30 tablet 6  . furosemide (LASIX) 40 MG tablet Take 1 tablet (40 mg total) by mouth daily. 30 tablet 6  . warfarin (COUMADIN) 5 MG tablet Take 1 tablet (5mg ) daily. Take an extra one-half tablet (7.5 mg) on Monday and Thursday. 45 tablet 0  . metoprolol succinate (TOPROL XL) 25 MG 24 hr tablet Take 1 tablet (25 mg total) by mouth daily. 30  tablet 0   No current facility-administered medications for this visit.    Allergies:  Azithromycin   Social History: The patient  reports that  has never smoked. He has quit using smokeless tobacco. His smokeless tobacco use included snuff. He reports that he drinks about 0.6 oz of alcohol per week. He reports that he does not use drugs.   ROS:  Please see the history of present illness. Otherwise,  complete review of systems is positive for intermittent arthritic symptoms.  All other systems are reviewed and negative.   Physical Exam: VS:  BP (!) 130/92   Pulse 91   Ht 5\' 11"  (1.803 m)   Wt 191 lb (86.6 kg)   SpO2 96%   BMI 26.64 kg/m , BMI Body mass index is 26.64 kg/m.  Wt Readings from Last 3 Encounters:  04/24/17 191 lb (86.6 kg)  11/27/16 178 lb (80.7 kg)  11/06/16 180 lb (81.6 kg)    General: Patient appears comfortable at rest. HEENT: Conjunctiva and lids normal, oropharynx clear. Neck: Supple, no elevated JVP or carotid bruits, no thyromegaly. Lungs: Clear to auscultation, nonlabored breathing at rest. Cardiac: Irregularly irregular, no S3 or significant systolic murmur, no pericardial rub. Abdomen: Soft, nontender, bowel sounds present, no guarding or rebound. Extremities: No pitting edema, distal pulses 2+. Skin: Warm and dry. Musculoskeletal: No kyphosis. Neuropsychiatric: Alert and oriented x3, affect grossly appropriate.  ECG: I personally reviewed the tracing from 10/03/2016 which showed rate-controlled atrial fibrillation with nonspecific ST-T changes.  Recent Labwork: 10/01/2016: ALT 73; AST 38; B Natriuretic Peptide 1,287.0 10/09/2016: BUN 17; BUN 18; Creatinine, Ser 1.19; Creatinine, Ser 1.17; Hemoglobin 12.3; Platelets 256; Potassium 4.2; Potassium 4.2; Sodium 137; Sodium 137     Component Value Date/Time   CHOL 104 10/03/2016 0744   TRIG 34 10/03/2016 0744   HDL 30 (L) 10/03/2016 0744   CHOLHDL 3.5 10/03/2016 0744   VLDL 7 10/03/2016 0744   LDLCALC 67 10/03/2016 0744    Other Studies Reviewed Today:  TEE 10/09/2016: Study Conclusions  - Left ventricle: Systolic function was severely reduced. The   estimated ejection fraction was in the range of 20% to 25%. - Aortic valve: Bicuspid. - Mitral valve: There was mild regurgitation. - Left atrium: There was a medium-sizedthrombus. There was   spontaneous echo contrast (&quot;smoke&quot;). -  Tricuspid valve: There was moderate regurgitation.  Impressions:  - There is evidence of thrombus in the left atrial appendage.     Cardioversion was not performed.  Cardiac catheterization 10/02/2016: Echo documentation of ejection fraction of 15-20%.  Dilated left ventricle with an LVEDP of 27 mm.  Moderate right heart pressure elevation with moderate pulmonary hypertension.  Normal epicardial coronary arteries.  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  Assessment and Plan:  1. Persistent atrial fibrillation. As noted previously, our plan is to continue with strategy of heart rate control and anticoagulation. This is been challenging in the setting of recurring noncompliance. Plan to resume Toprol-XL starting at 25 mg daily, may need higher dose depending on heart rate control. We'll have PT/INR checked today on Coumadin and get him reestablished for regular follow-up in the anticoagulation clinic.  2. Nonischemic cardiomyopathy with normal coronary arteries, LVEF 20-25% in July 2018. I reinforced compliance with medications for heart rate control as this is most likely tachycardia-mediated. We have discussed getting a follow-up echocardiogram, but will hold off on this until his medications are stabilized.  3. History of left atrial appendage thrombus by TEE  in July 2018.  4. History of alcohol abuse, continues to report cessation.  Current medicines were reviewed with the patient today.  Disposition: Follow-up in the next 3 weeks in the [place] office.  Signed, Jonelle Sidle, MD, [facility] 04/24/2017 9:39 AM    [facility] [address] [place], [place], [place] [address] Phone: [phone]; Fax: [phone]

## 2017-04-24 ENCOUNTER — Ambulatory Visit (INDEPENDENT_AMBULATORY_CARE_PROVIDER_SITE_OTHER): Payer: Medicaid Other | Admitting: *Deleted

## 2017-04-24 ENCOUNTER — Encounter: Payer: Self-pay | Admitting: Cardiology

## 2017-04-24 ENCOUNTER — Ambulatory Visit: Payer: 59 | Admitting: Cardiology

## 2017-04-24 ENCOUNTER — Telehealth: Payer: Self-pay | Admitting: *Deleted

## 2017-04-24 VITALS — BP 130/92 | HR 91 | Ht 71.0 in | Wt 191.0 lb

## 2017-04-24 DIAGNOSIS — I513 Intracardiac thrombosis, not elsewhere classified: Secondary | ICD-10-CM

## 2017-04-24 DIAGNOSIS — F1011 Alcohol abuse, in remission: Secondary | ICD-10-CM

## 2017-04-24 DIAGNOSIS — I428 Other cardiomyopathies: Secondary | ICD-10-CM | POA: Diagnosis not present

## 2017-04-24 DIAGNOSIS — I481 Persistent atrial fibrillation: Secondary | ICD-10-CM

## 2017-04-24 DIAGNOSIS — I4819 Other persistent atrial fibrillation: Secondary | ICD-10-CM

## 2017-04-24 DIAGNOSIS — Z5181 Encounter for therapeutic drug level monitoring: Secondary | ICD-10-CM

## 2017-04-24 DIAGNOSIS — I4891 Unspecified atrial fibrillation: Secondary | ICD-10-CM

## 2017-04-24 DIAGNOSIS — Z87898 Personal history of other specified conditions: Secondary | ICD-10-CM

## 2017-04-24 LAB — POCT INR: INR: 1.1

## 2017-04-24 MED ORDER — METOPROLOL SUCCINATE ER 25 MG PO TB24: 25.0000 mg | ORAL_TABLET | Freq: Every day | ORAL | 0 refills | Status: DC

## 2017-04-24 NOTE — Patient Instructions (Signed)
Medication Instructions:  Your physician has recommended you make the following change in your medication:   BEGIN Toprol xl 25 mg daily  Please continue all other medications as prescribed  Labwork: NONE  Testing/Procedures: NONE  Follow-Up: Your physician recommends that you schedule a follow-up appointment in: 3 WEEKS WITH BRITTANY STRADER  Any Other Special Instructions Will Be Listed Below (If Applicable).  If you need a refill on your cardiac medications before your next appointment, please call your pharmacy.

## 2017-04-24 NOTE — Telephone Encounter (Signed)
INR 1.1 / please call patient with instructions / tg

## 2017-04-24 NOTE — Telephone Encounter (Signed)
Done.  See coumadin note. 

## 2017-04-24 NOTE — Patient Instructions (Signed)
Take 2 tablets tonight and tomorrow night then resume 1 1/2 tablets daily.  Recheck in 3 weeks at MD appt. States he has been take 5mg  every other day trying to make Rx last.  Per [facility] pt has not had warfarin filled since 02/25/17

## 2017-04-26 ENCOUNTER — Telehealth: Payer: Self-pay | Admitting: *Deleted

## 2017-04-26 MED ORDER — WARFARIN SODIUM 5 MG PO TABS: ORAL_TABLET | ORAL | 0 refills | Status: DC

## 2017-04-26 NOTE — Telephone Encounter (Signed)
°*  STAT* If patient is at the pharmacy, call can be transferred to refill team.   1. Which medications need to be refilled? (please list name of each medication and dose if known)  warfarin (COUMADIN) 5 MG tablet [[id number]]   2. Which pharmacy/location (including street and city if local pharmacy) is medication to be sent to? [facility]   3. Do they need a 30 day or 90 day supply?  90 day

## 2017-04-26 NOTE — Telephone Encounter (Signed)
RX sent - 30 day supply based on adherence to INR checks.

## 2017-05-15 NOTE — Progress Notes (Deleted)
Cardiology Office Note    Date:  05/15/2017   ID:  CYNTHIA CHALLENDER [name], DOB 03-Aug-1960, MRN [id number]  PCP:  Salley Scarlet, MD  Cardiologist: Dr. Diona Browner   No chief complaint on file.   History of Present Illness:    David [name] is a 57 y.o. 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   He was recently examined by Dr. Diona Browner on 04/24/2017 and reported having intermittent lightheadedness and dizziness. Denied any chest pain or palpitations. He had been noncompliant with medications and INR checks and was restarted on Toprol-XL 25mg  daily at the time of his office visit.   Past Medical History:  Diagnosis Date  . Alcohol abuse   . Arthritis    Bilateral knees   . Atrial fibrillation (HCC)   . Chronic kidney disease, stage 2, mildly decreased GFR   . GERD (gastroesophageal reflux disease)   . Gout   . Nonischemic cardiomyopathy (HCC)    LVEF 20-25% improved to 50% on medical therapy  . TIA (transient ischemic attack)     Past Surgical History:  Procedure Laterality Date  . Arthroscopic knee surgery Right 1987  . CARDIOVERSION  10/08/2011   Procedure: CARDIOVERSION;  Surgeon: Jonelle Sidle, MD;  Location: [facility];  Service: Cardiovascular;  Laterality: N/A;  Done in PACU  . CARDIOVERSION  01/10/2012   Procedure: CARDIOVERSION;  Surgeon: Marinus Maw, MD;  Location: [facility] ENDOSCOPY;  Service: Cardiovascular;  Laterality: N/A;  . CARDIOVERSION N/A 11/17/2014   Procedure: CARDIOVERSION;  Surgeon: Laqueta Linden, MD;  Location: [facility];  Service: Cardiovascular;  Laterality: N/A;  . RIGHT/LEFT HEART CATH AND CORONARY ANGIOGRAPHY N/A 10/02/2016   Procedure: Right/Left Heart Cath and Coronary Angiography;  Surgeon: Lennette Bihari, MD;   Location: [facility] INVASIVE CV LAB;  Service: Cardiovascular;  Laterality: N/A;  . TEE WITHOUT CARDIOVERSION N/A 11/17/2014   Procedure: TRANSESOPHAGEAL ECHOCARDIOGRAM (TEE);  Surgeon: Laqueta Linden, MD;  Location: [facility];  Service: Cardiovascular;  Laterality: N/A;  . TEE WITHOUT CARDIOVERSION N/A 10/09/2016   Procedure: TRANSESOPHAGEAL ECHOCARDIOGRAM (TEE);  Surgeon: Elease Hashimoto Deloris Ping, MD;  Location: [facility] ENDOSCOPY;  Service: Cardiovascular;  Laterality: N/A;  . TOTAL KNEE ARTHROPLASTY Left 01/16/2013   Procedure: TOTAL KNEE ARTHROPLASTY;  Surgeon: Vickki Hearing, MD;  Location: [facility];  Service: Orthopedics;  Laterality: Left;    Current Medications: Outpatient Medications Prior to Visit  Medication Sig Dispense Refill  . allopurinol (ZYLOPRIM) 300 MG tablet Take 1 tablet (300 mg total) by mouth daily. 30 tablet 5  . digoxin (LANOXIN) 0.125 MG tablet Take 1 tablet (0.125 mg total) daily by mouth. 30 tablet 6  . furosemide (LASIX) 40 MG tablet Take 1 tablet (40 mg total) by mouth daily. 30 tablet 6  . metoprolol succinate (TOPROL XL) 25 MG 24 hr tablet Take 1 tablet (25 mg total) by mouth daily. 30 tablet 0  . warfarin (COUMADIN) 5 MG tablet Take 1.5 tablets daily or as directed by Coumadin clinic 50 tablet 0   No facility-administered medications prior to visit.      Allergies:   Azithromycin   Social History   Socioeconomic History  . Marital status: Married    Spouse name: Not on file  . Number of children: Not  on file  . Years of education: Not on file  . Highest education level: Not on file  Social Needs  . Financial resource strain: Not on file  . Food insecurity - worry: Not on file  . Food insecurity - inability: Not on file  . Transportation needs - medical: Not on file  . Transportation needs - non-medical: Not on file  Occupational History  . Not on file  Tobacco Use  . Smoking status: Never Smoker  . Smokeless tobacco: Former Neurosurgeon    Types: Snuff  . Tobacco  comment: quit 1980's  Substance and Sexual Activity  . Alcohol use: Yes    Alcohol/week: 0.6 oz    Types: 1 Cans of beer per week    Comment: last beer 01/12/2013 but sts i am quitting  . Drug use: No    Comment: last time 2 years ago  . Sexual activity: Yes    Partners: Male    Birth control/protection: None  Other Topics Concern  . Not on file  Social History Narrative   Lives in [place] with his sister   Carmela Hurt off from [employer].     Family History:  The patient's ***family history includes Hypertension in his brother, mother, and sister.   Review of Systems:   Please see the history of present illness.     General:  No chills, fever, night sweats or weight changes.  Cardiovascular:  No chest pain, dyspnea on exertion, edema, orthopnea, palpitations, paroxysmal nocturnal dyspnea. Dermatological: No rash, lesions/masses Respiratory: No cough, dyspnea Urologic: No hematuria, dysuria Abdominal:   No nausea, vomiting, diarrhea, bright red blood per rectum, melena, or hematemesis Neurologic:  No visual changes, wkns, changes in mental status. All other systems reviewed and are otherwise negative except as noted above.   Physical Exam:    VS:  There were no vitals taken for this visit.   General: Well developed, well nourished,male appearing in no acute distress. Head: Normocephalic, atraumatic, sclera non-icteric, no xanthomas, nares are without discharge.  Neck: No carotid bruits. JVD not elevated.  Lungs: Respirations regular and unlabored, without wheezes or rales.  Heart: ***Regular rate and rhythm. No S3 or S4.  No murmur, no rubs, or gallops appreciated. Abdomen: Soft, non-tender, non-distended with normoactive bowel sounds. No hepatomegaly. No rebound/guarding. No obvious abdominal masses. Msk:  Strength and tone appear normal for age. No joint deformities or effusions. Extremities: No clubbing or cyanosis. No edema.  Distal pedal pulses are 2+ bilaterally. Neuro:  Alert and oriented X 3. Moves all extremities spontaneously. No focal deficits noted. Psych:  Responds to questions appropriately with a normal affect. Skin: No rashes or lesions noted  Wt Readings from Last 3 Encounters:  04/24/17 191 lb (86.6 kg)  11/27/16 178 lb (80.7 kg)  11/06/16 180 lb (81.6 kg)        Studies/Labs Reviewed:   EKG:  EKG is*** ordered today.  The ekg ordered today demonstrates ***  Recent Labs: 10/01/2016: ALT 73; B Natriuretic Peptide 1,287.0 10/09/2016: BUN 17; BUN 18; Creatinine, Ser 1.19; Creatinine, Ser 1.17; Hemoglobin 12.3; Platelets 256; Potassium 4.2; Potassium 4.2; Sodium 137; Sodium 137   Lipid Panel    Component Value Date/Time   CHOL 104 10/03/2016 0744   TRIG 34 10/03/2016 0744   HDL 30 (L) 10/03/2016 0744   CHOLHDL 3.5 10/03/2016 0744   VLDL 7 10/03/2016 0744   LDLCALC 67 10/03/2016 0744    Additional studies/ records that were reviewed today include:  Cardiac Catheterization: 09/2016 Echo documentation of ejection fraction of 15-20%.  Dilated left ventricle with an LVEDP of 27 mm.  Moderate right heart pressure elevation with moderate pulmonary hypertension.  Normal epicardial coronary arteries.  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  RECOMMENDATION: Aggressive medical therapy for a nonischemic dilated cardiomyopathy.  Consider ARB therapy with future transition to entresto, spironolactone,  carvedilol, and consideration for initial LifeVest with possible need for future prophylactic ICD implantation.   Echocardiogram: 09/2016 Study Conclusions  - Left ventricle: The cavity size was mildly dilated. Wall   thickness was normal. Systolic function was severely reduced. The   estimated ejection fraction was in the range of 15% to 20%.   Diffuse hypokinesis. The study is not technically sufficient to   allow evaluation of LV diastolic function. - Aortic valve: Mildly calcified annulus.  Trileaflet; mildly   thickened leaflets. There was moderate regurgitation. Valve area   (VTI): 1.73 cm^2. Valve area (Vmax): 1.41 cm^2. - Mitral valve: Mildly calcified annulus. Normal thickness leaflets   . - Left atrium: The atrium was severely dilated. - Right ventricle: The cavity size was mildly dilated. Systolic   function was mildly reduced. - Right atrium: The atrium was severely dilated. - Tricuspid valve: There was moderate regurgitation. - Pulmonary arteries: Systolic pressure was mildly to moderately   increased. PA peak pressure: 39 mm Hg (S). - Technically adequate study.   Assessment:    No diagnosis found.   Plan:   In order of problems listed above:  1. ***    Medication Adjustments/Labs and Tests Ordered: Current medicines are reviewed at length with the patient today.  Concerns regarding medicines are outlined above.  Medication changes, Labs and Tests ordered today are listed in the Patient Instructions below. There are no Patient Instructions on file for this visit.   Signed, Ellsworth Lennox, PA-C  05/15/2017 7:38 PM    [facility] [address] [place], [place] [address] Phone: [phone]

## 2017-05-16 ENCOUNTER — Encounter: Payer: Self-pay | Admitting: Student

## 2017-05-16 ENCOUNTER — Ambulatory Visit: Payer: Medicaid Other | Admitting: Student

## 2017-05-16 NOTE — Progress Notes (Signed)
Cardiology Office Note    Date:  05/17/2017   ID:  David [name], DOB 1960/08/03, MRN [id number]  PCP:  Salley Scarlet, MD  Cardiologist: Dr. Diona Browner    Chief Complaint  Patient presents with  . Follow-up    3 week visit    History of Present Illness:    David [name] is a 57 y.o. 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   He was recently examined by Dr. Diona Browner on 04/24/2017 and reported having intermittent lightheadedness and dizziness. Denied any chest pain or palpitations. He had been noncompliant with medications and INR checks (INR 1.1 at the time of his visit) and was restarted on Toprol-XL 25mg  daily at the time of his office visit.   In talking with the patient today, he reports overall doing well since his last office visit. He denies any recent chest discomfort, dyspnea on exertion, orthopnea, PND, lower extremity edema, palpitations, or presyncope. He reports good compliance with his medication regimen and denies missing any recent doses.  He is anxious to return to work and has been increasing his level of activity at home without any noted symptoms. He has not checked his blood pressure since the last office visit but this is soft today at 104/62 denies any symptoms at this time. Denies any evidence of active bleeding while on Coumadin.    Past Medical History:  Diagnosis Date  . Alcohol abuse   . Arthritis    Bilateral knees   . Atrial fibrillation (HCC)   . Chronic kidney disease, stage 2, mildly decreased GFR   . GERD (gastroesophageal reflux disease)   . Gout   . Nonischemic cardiomyopathy (HCC)    a. LVEF 20-25% in 2013, improved to 50% on medical therapy. b. 09/2016: echo showing reduced EF of 15-20% and cath  showing normal cors.   Marland Kitchen TIA (transient ischemic attack)     Past Surgical History:  Procedure Laterality Date  . Arthroscopic knee surgery Right 1987  . CARDIOVERSION  10/08/2011   Procedure: CARDIOVERSION;  Surgeon: Jonelle Sidle, MD;  Location: [facility];  Service: Cardiovascular;  Laterality: N/A;  Done in PACU  . CARDIOVERSION  01/10/2012   Procedure: CARDIOVERSION;  Surgeon: Marinus Maw, MD;  Location: [facility] ENDOSCOPY;  Service: Cardiovascular;  Laterality: N/A;  . CARDIOVERSION N/A 11/17/2014   Procedure: CARDIOVERSION;  Surgeon: Laqueta Linden, MD;  Location: [facility]S;  Service: Cardiovascular;  Laterality: N/A;  . RIGHT/LEFT HEART CATH AND CORONARY ANGIOGRAPHY N/A 10/02/2016   Procedure: Right/Left Heart Cath and Coronary Angiography;  Surgeon: Lennette Bihari, MD;  Location: [facility] INVASIVE CV LAB;  Service: Cardiovascular;  Laterality: N/A;  . TEE WITHOUT CARDIOVERSION N/A 11/17/2014   Procedure: TRANSESOPHAGEAL ECHOCARDIOGRAM (TEE);  Surgeon: Laqueta Linden, MD;  Location: [facility];  Service: Cardiovascular;  Laterality: N/A;  . TEE WITHOUT CARDIOVERSION N/A 10/09/2016   Procedure: TRANSESOPHAGEAL ECHOCARDIOGRAM (TEE);  Surgeon: Elease Hashimoto Deloris Ping, MD;  Location: [facility] ENDOSCOPY;  Service: Cardiovascular;  Laterality: N/A;  . TOTAL KNEE ARTHROPLASTY Left 01/16/2013   Procedure: TOTAL KNEE ARTHROPLASTY;  Surgeon: Vickki Hearing, MD;  Location: [facility];  Service: Orthopedics;  Laterality: Left;    Current Medications: Outpatient Medications Prior to Visit  Medication Sig Dispense Refill  .  allopurinol (ZYLOPRIM) 300 MG tablet Take 1 tablet (300 mg total) by mouth daily. 30 tablet 5  . warfarin (COUMADIN) 5 MG tablet Take 1.5 tablets daily or as directed by Coumadin clinic 50 tablet 0  . digoxin (LANOXIN) 0.125 MG tablet Take 1 tablet (0.125 mg total) daily by mouth. 30 tablet 6  . furosemide (LASIX) 40 MG tablet Take 1 tablet (40 mg total) by mouth daily. 30 tablet 6  . metoprolol  succinate (TOPROL XL) 25 MG 24 hr tablet Take 1 tablet (25 mg total) by mouth daily. 30 tablet 0   No facility-administered medications prior to visit.      Allergies:   Azithromycin   Social History   Socioeconomic History  . Marital status: Married    Spouse name: None  . Number of children: None  . Years of education: None  . Highest education level: None  Social Needs  . Financial resource strain: None  . Food insecurity - worry: None  . Food insecurity - inability: None  . Transportation needs - medical: None  . Transportation needs - non-medical: None  Occupational History  . None  Tobacco Use  . Smoking status: Never Smoker  . Smokeless tobacco: Former Neurosurgeon    Types: Snuff  . Tobacco comment: quit 1980's  Substance and Sexual Activity  . Alcohol use: Yes    Alcohol/week: 0.6 oz    Types: 1 Cans of beer per week    Comment: daily   . Drug use: No    Comment: last time 2 years ago  . Sexual activity: Yes    Partners: Male    Birth control/protection: None  Other Topics Concern  . None  Social History Narrative   Lives in [place] with his sister   Carmela Hurt off from [employer].     Family History:  The patient's family history includes Hypertension in his brother, mother, and sister.   Review of Systems:   Please see the history of present illness.     General:  No chills, fever, night sweats or weight changes.  Cardiovascular:  No chest pain, dyspnea on exertion, edema, orthopnea, palpitations, paroxysmal nocturnal dyspnea. Dermatological: No rash, lesions/masses Respiratory: No cough, dyspnea Urologic: No hematuria, dysuria Abdominal:   No nausea, vomiting, diarrhea, bright red blood per rectum, melena, or hematemesis Neurologic:  No visual changes, wkns, changes in mental status.  He denies any of the above symptoms.   All other systems reviewed and are otherwise negative except as noted above.   Physical Exam:    VS:  BP 104/62   Pulse 88    Ht 5\' 11"  (1.803 m)   Wt 180 lb 6.4 oz (81.8 kg)   SpO2 98%   BMI 25.16 kg/m    General: Well developed, well nourished [place] American male appearing in no acute distress. Head: Normocephalic, atraumatic, sclera non-icteric, no xanthomas, nares are without discharge.  Neck: No carotid bruits. JVD not elevated.  Lungs: Respirations regular and unlabored, without wheezes or rales.  Heart: Irregularly irregular. No S3 or S4.  No murmur, no rubs, or gallops appreciated. Abdomen: Soft, non-tender, non-distended with normoactive bowel sounds. No hepatomegaly. No rebound/guarding. No obvious abdominal masses. Msk:  Strength and tone appear normal for age. No joint deformities or effusions. Extremities: No clubbing or cyanosis. No lower extremity edema.  Distal pedal pulses are 2+ bilaterally. Neuro: Alert and oriented X 3. Moves all extremities spontaneously. No focal deficits noted. Psych:  Responds to questions appropriately with  a normal affect. Skin: No rashes or lesions noted  Wt Readings from Last 3 Encounters:  05/17/17 180 lb 6.4 oz (81.8 kg)  04/24/17 191 lb (86.6 kg)  11/27/16 178 lb (80.7 kg)     Studies/Labs Reviewed:   EKG:  EKG is not ordered today.   Recent Labs: 10/01/2016: ALT 73; B Natriuretic Peptide 1,287.0 10/09/2016: BUN 17; BUN 18; Creatinine, Ser 1.19; Creatinine, Ser 1.17; Hemoglobin 12.3; Platelets 256; Potassium 4.2; Potassium 4.2; Sodium 137; Sodium 137   Lipid Panel    Component Value Date/Time   CHOL 104 10/03/2016 0744   TRIG 34 10/03/2016 0744   HDL 30 (L) 10/03/2016 0744   CHOLHDL 3.5 10/03/2016 0744   VLDL 7 10/03/2016 0744   LDLCALC 67 10/03/2016 0744    Additional studies/ records that were reviewed today include:   Echocardiogram: 09/2016 Study Conclusions  - Left ventricle: The cavity size was mildly dilated. Wall   thickness was normal. Systolic function was severely reduced. The   estimated ejection fraction was in the range of 15% to  20%.   Diffuse hypokinesis. The study is not technically sufficient to   allow evaluation of LV diastolic function. - Aortic valve: Mildly calcified annulus. Trileaflet; mildly   thickened leaflets. There was moderate regurgitation. Valve area   (VTI): 1.73 cm^2. Valve area (Vmax): 1.41 cm^2. - Mitral valve: Mildly calcified annulus. Normal thickness leaflets   . - Left atrium: The atrium was severely dilated. - Right ventricle: The cavity size was mildly dilated. Systolic   function was mildly reduced. - Right atrium: The atrium was severely dilated. - Tricuspid valve: There was moderate regurgitation. - Pulmonary arteries: Systolic pressure was mildly to moderately   increased. PA peak pressure: 39 mm Hg (S). - Technically adequate study.  Cardiac Catheterization: 09/2016 Echo documentation of ejection fraction of 15-20%.  Dilated left ventricle with an LVEDP of 27 mm.  Moderate right heart pressure elevation with moderate pulmonary hypertension.  Normal epicardial coronary arteries.  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  RECOMMENDATION: Aggressive medical therapy for a nonischemic dilated cardiomyopathy.  Consider ARB therapy with future transition to entresto, spironolactone,  carvedilol, and consideration for initial LifeVest with possible need for future prophylactic ICD implantation.   Assessment:    1. Persistent atrial fibrillation (HCC)   2. Long term (current) use of anticoagulants   3. Chronic systolic (congestive) heart failure (HCC)   4. NICM (nonischemic cardiomyopathy) (HCC)   5. Thrombus of left atrial appendage without antecedent myocardial infarction   6. Essential hypertension   7. Medication management   8. Encounter for monitoring digoxin therapy      Plan:   In order of problems listed above:  1. Persistent Atrial Fibrillation/ Use of Long-term Anticoagulation - he denies any recent palpitations and HR is  well-controlled in the 80's during today's visit. Continue Toprol-XL and Digoxin for rate-control.  - he denies any evidence of active bleeding. Remains on Coumadin for anticoagulation. INR checked in clinic and at 1.8 (goal 2.0 - 3.0). Results sent to the Coumadin Clinic who have helped to adjust his dosing to achieve a therapeutic goal.   2. Chronic Systolic CHF/ Nonischemic Cardiomyopathy - the patient had an EF of 20-25% by echo in 2013 which improved to 50% with medical therapy. EF found to be reduced to 15-20% by echo in 09/2016 with catheterization at that time showing normal coronary arteries.  - he denies any recent dyspnea on exertion, orthopnea, PND, or  lower extremity edema. Does not appear volume overloaded by physical examination.  - continue on Toprol-XL and Lasix 40mg  daily. Unable to titrate dosing or add ACE-I/ARB at this time secondary to soft blood pressure. I have asked him to follow BP at home. Remains on Digoxin (will check Dig Level). Also recheck BMET following re-initiation of Lasix to assess kidney function and potassium levels.  - he wishes to return to work and his CHF appears overall compensated. Will provide a return to work note.   3. Left Atrial Appendage Thrombus - noted on TEE in 10/2016. Remains on Coumadin for anticoagulation. Will recheck INR today as he missed his Coumadin Clinic appointment yesterday.   4. HTN - BP is soft today at 104/62. - will continue on current medication regimen.    Medication Adjustments/Labs and Tests Ordered: Current medicines are reviewed at length with the patient today.  Concerns regarding medicines are outlined above.  Medication changes, Labs and Tests ordered today are listed in the Patient Instructions below. Patient Instructions  Medication Instructions:  Your physician recommends that you continue on your current medications as directed. Please refer to the Current Medication list given to you today.  Labwork: Your  physician recommends that you return for lab work today.  Testing/Procedures: NONE   Follow-Up: Your physician recommends that you schedule a follow-up appointment in: 6-8 weeks.  Any Other Special Instructions Will Be Listed Below (If Applicable).  If you need a refill on your cardiac medications before your next appointment, please call your pharmacy.  Thank you for choosing [facility]!      Signed, Ellsworth Lennox, PA-C  05/17/2017 2:10 PM    [facility] [address] [place], [place] [address] Phone: [phone]

## 2017-05-17 ENCOUNTER — Other Ambulatory Visit (HOSPITAL_COMMUNITY)
Admission: RE | Admit: 2017-05-17 | Discharge: 2017-05-17 | Disposition: A | Discharge status: Home / Self Care | Payer: Medicaid Other | Source: Ambulatory Visit | Attending: Student | Admitting: Student

## 2017-05-17 ENCOUNTER — Ambulatory Visit (INDEPENDENT_AMBULATORY_CARE_PROVIDER_SITE_OTHER): Payer: Self-pay | Admitting: Student

## 2017-05-17 ENCOUNTER — Encounter: Payer: Self-pay | Admitting: Student

## 2017-05-17 ENCOUNTER — Ambulatory Visit (INDEPENDENT_AMBULATORY_CARE_PROVIDER_SITE_OTHER): Payer: Self-pay | Admitting: Internal Medicine

## 2017-05-17 VITALS — BP 104/62 | HR 88 | Ht 71.0 in | Wt 180.4 lb

## 2017-05-17 DIAGNOSIS — Z5181 Encounter for therapeutic drug level monitoring: Secondary | ICD-10-CM

## 2017-05-17 DIAGNOSIS — I481 Persistent atrial fibrillation: Secondary | ICD-10-CM | POA: Diagnosis not present

## 2017-05-17 DIAGNOSIS — Z79899 Other long term (current) drug therapy: Secondary | ICD-10-CM | POA: Insufficient documentation

## 2017-05-17 DIAGNOSIS — I428 Other cardiomyopathies: Secondary | ICD-10-CM

## 2017-05-17 DIAGNOSIS — Z7901 Long term (current) use of anticoagulants: Secondary | ICD-10-CM

## 2017-05-17 DIAGNOSIS — I10 Essential (primary) hypertension: Secondary | ICD-10-CM | POA: Diagnosis present

## 2017-05-17 DIAGNOSIS — I4819 Other persistent atrial fibrillation: Secondary | ICD-10-CM

## 2017-05-17 DIAGNOSIS — I513 Intracardiac thrombosis, not elsewhere classified: Secondary | ICD-10-CM | POA: Diagnosis present

## 2017-05-17 DIAGNOSIS — I5022 Chronic systolic (congestive) heart failure: Secondary | ICD-10-CM | POA: Diagnosis present

## 2017-05-17 LAB — BASIC METABOLIC PANEL
Anion gap: 9 (ref 5–15)
BUN: 23 mg/dL — ABNORMAL HIGH (ref 6–20)
CO2: 24 mmol/L (ref 22–32)
Calcium: 9.1 mg/dL (ref 8.9–10.3)
Chloride: 105 mmol/L (ref 101–111)
Creatinine, Ser: 1.34 mg/dL — ABNORMAL HIGH (ref 0.61–1.24)
GFR calc Af Amer: >60 mL/min (ref >60)
GFR calc non Af Amer: 58 mL/min — ABNORMAL LOW (ref >60)
Glucose, Bld: 79 mg/dL (ref 65–99)
Potassium: 4 mmol/L (ref 3.5–5.1)
Sodium: 138 mmol/L (ref 135–145)

## 2017-05-17 LAB — POCT INR: INR: 1.8

## 2017-05-17 LAB — DIGOXIN LEVEL: Digoxin Level: 0.6 ng/mL — ABNORMAL LOW (ref 0.8–2.0)

## 2017-05-17 MED ORDER — DIGOXIN 125 MCG PO TABS: 0.1250 mg | ORAL_TABLET | Freq: Every day | ORAL | 3 refills | Status: DC

## 2017-05-17 MED ORDER — FUROSEMIDE 40 MG PO TABS: 40.0000 mg | ORAL_TABLET | Freq: Every day | ORAL | 3 refills | Status: DC

## 2017-05-17 MED ORDER — METOPROLOL SUCCINATE ER 25 MG PO TB24: 25.0000 mg | ORAL_TABLET | Freq: Every day | ORAL | 3 refills | Status: DC

## 2017-05-17 NOTE — Patient Instructions (Signed)
Medication Instructions:  Your physician recommends that you continue on your current medications as directed. Please refer to the Current Medication list given to you today.   Labwork: Your physician recommends that you return for lab work today.   Testing/Procedures: NONE   Follow-Up: Your physician recommends that you schedule a follow-up appointment in: 6-8 weeks.    Any Other Special Instructions Will Be Listed Below (If Applicable).     If you need a refill on your cardiac medications before your next appointment, please call your pharmacy.  Thank you for choosing [facility]!

## 2017-05-17 NOTE — Patient Instructions (Signed)
Description   Take an extra 1/2 tablet tonight then resume 1.5 tablets daily.  Recheck in 2 weeks.

## 2017-05-23 ENCOUNTER — Other Ambulatory Visit: Payer: Self-pay | Admitting: Cardiovascular Disease

## 2017-05-23 ENCOUNTER — Other Ambulatory Visit: Payer: Self-pay | Admitting: Cardiology

## 2017-05-27 ENCOUNTER — Other Ambulatory Visit: Payer: Self-pay | Admitting: Cardiovascular Disease

## 2017-05-27 ENCOUNTER — Ambulatory Visit (INDEPENDENT_AMBULATORY_CARE_PROVIDER_SITE_OTHER): Payer: Self-pay | Admitting: *Deleted

## 2017-05-27 DIAGNOSIS — I4891 Unspecified atrial fibrillation: Secondary | ICD-10-CM

## 2017-05-27 DIAGNOSIS — Z5181 Encounter for therapeutic drug level monitoring: Secondary | ICD-10-CM

## 2017-05-27 LAB — POCT INR: INR: 2.6

## 2017-05-27 NOTE — Patient Instructions (Signed)
Continue coumadin 1.5 tablets daily.  Recheck in 3 weeks.

## 2017-06-17 ENCOUNTER — Ambulatory Visit (INDEPENDENT_AMBULATORY_CARE_PROVIDER_SITE_OTHER): Payer: Self-pay | Admitting: *Deleted

## 2017-06-17 DIAGNOSIS — I4891 Unspecified atrial fibrillation: Secondary | ICD-10-CM

## 2017-06-17 DIAGNOSIS — Z5181 Encounter for therapeutic drug level monitoring: Secondary | ICD-10-CM

## 2017-06-17 LAB — POCT INR: INR: 5.2

## 2017-06-17 NOTE — Patient Instructions (Addendum)
Hold coumadin tomorrow and Wednesday then resume 1.5 tablets daily.  Recheck in 2 weeks. Took coumadin this morning

## 2017-07-11 NOTE — Progress Notes (Deleted)
Cardiology Office Note  Date: 07/11/2017   ID: [name], DOB 1960/07/27, MRN [id number]  PCP: Salley Scarlet, MD  Primary Cardiologist: Nona Dell, MD   No chief complaint on file.   History of Present Illness: David [name] is a 57 y.o. male last seen by Ms. Strader PA-C in February.  At the previous visit heart rate control in atrial fibrillation was adequate on combination of Toprol-XL and Lanoxin.  He continues on Coumadin with follow-up in the anticoagulation clinic.  Past Medical History:  Diagnosis Date  . Alcohol abuse   . Arthritis    Bilateral knees   . Atrial fibrillation (HCC)   . Chronic kidney disease, stage 2, mildly decreased GFR   . GERD (gastroesophageal reflux disease)   . Gout   . Nonischemic cardiomyopathy (HCC)    a. LVEF 20-25% in 2013, improved to 50% on medical therapy. b. 09/2016: echo showing reduced EF of 15-20% and cath showing normal cors.   Marland Kitchen TIA (transient ischemic attack)     Past Surgical History:  Procedure Laterality Date  . Arthroscopic knee surgery Right 1987  . CARDIOVERSION  10/08/2011   Procedure: CARDIOVERSION;  Surgeon: Jonelle Sidle, MD;  Location: [facility];  Service: Cardiovascular;  Laterality: N/A;  Done in PACU  . CARDIOVERSION  01/10/2012   Procedure: CARDIOVERSION;  Surgeon: Marinus Maw, MD;  Location: [facility] ENDOSCOPY;  Service: Cardiovascular;  Laterality: N/A;  . CARDIOVERSION N/A 11/17/2014   Procedure: CARDIOVERSION;  Surgeon: Laqueta Linden, MD;  Location: [facility];  Service: Cardiovascular;  Laterality: N/A;  . RIGHT/LEFT HEART CATH AND CORONARY ANGIOGRAPHY N/A 10/02/2016   Procedure: Right/Left Heart Cath and Coronary Angiography;  Surgeon: Lennette Bihari, MD;  Location: [facility] INVASIVE CV LAB;  Service: Cardiovascular;  Laterality: N/A;  . TEE WITHOUT CARDIOVERSION N/A 11/17/2014   Procedure: TRANSESOPHAGEAL ECHOCARDIOGRAM (TEE);  Surgeon: Laqueta Linden, MD;  Location: [facility]S;  Service:  Cardiovascular;  Laterality: N/A;  . TEE WITHOUT CARDIOVERSION N/A 10/09/2016   Procedure: TRANSESOPHAGEAL ECHOCARDIOGRAM (TEE);  Surgeon: Elease Hashimoto Deloris Ping, MD;  Location: [facility] ENDOSCOPY;  Service: Cardiovascular;  Laterality: N/A;  . TOTAL KNEE ARTHROPLASTY Left 01/16/2013   Procedure: TOTAL KNEE ARTHROPLASTY;  Surgeon: Vickki Hearing, MD;  Location: [facility]S;  Service: Orthopedics;  Laterality: Left;    Current Outpatient Medications  Medication Sig Dispense Refill  . allopurinol (ZYLOPRIM) 300 MG tablet TAKE 1 TABLET BY MOUTH ONCE DAILY 30 tablet 5  . digoxin (LANOXIN) 0.125 MG tablet Take 1 tablet (0.125 mg total) by mouth daily. 90 tablet 3  . furosemide (LASIX) 40 MG tablet Take 1 tablet (40 mg total) by mouth daily. 90 tablet 3  . metoprolol succinate (TOPROL XL) 25 MG 24 hr tablet Take 1 tablet (25 mg total) by mouth daily. 90 tablet 3  . warfarin (COUMADIN) 5 MG tablet TAKE 1 & 1/2 (ONE & ONE-HALF) TABLETS BY MOUTH ONCE DAILY OR AS DIRECTED BY COUMADIN CLINIC 50 tablet 1   No current facility-administered medications for this visit.    Allergies:  Azithromycin   Social History: The patient  reports that he has never smoked. He has quit using smokeless tobacco. His smokeless tobacco use included snuff. He reports that he drinks about 0.6 oz of alcohol per week. He reports that he does not use drugs.   Family History: The patient's family history includes Hypertension in his brother, mother, and sister.   ROS:  Please see  the history of present illness. Otherwise, complete review of systems is positive for {NONE DEFAULTED:18576::"none"}.  All other systems are reviewed and negative.   Physical Exam: VS:  There were no vitals taken for this visit., BMI There is no height or weight on file to calculate BMI.  Wt Readings from Last 3 Encounters:  05/17/17 180 lb 6.4 oz (81.8 kg)  04/24/17 191 lb (86.6 kg)  11/27/16 178 lb (80.7 kg)    General: Patient appears comfortable at  rest. HEENT: Conjunctiva and lids normal, oropharynx clear with moist mucosa. Neck: Supple, no elevated JVP or carotid bruits, no thyromegaly. Lungs: Clear to auscultation, nonlabored breathing at rest. Cardiac: Regular rate and rhythm, no S3 or significant systolic murmur, no pericardial rub. Abdomen: Soft, nontender, no hepatomegaly, bowel sounds present, no guarding or rebound. Extremities: No pitting edema, distal pulses 2+. Skin: Warm and dry. Musculoskeletal: No kyphosis. Neuropsychiatric: Alert and oriented x3, affect grossly appropriate.  ECG: I personally reviewed the tracing from  Recent Labwork: 10/01/2016: ALT 73; AST 38; B Natriuretic Peptide 1,287.0 10/09/2016: Hemoglobin 12.3; Platelets 256 05/17/2017: BUN 23; Creatinine, Ser 1.34; Potassium 4.0; Sodium 138     Component Value Date/Time   CHOL 104 10/03/2016 0744   TRIG 34 10/03/2016 0744   HDL 30 (L) 10/03/2016 0744   CHOLHDL 3.5 10/03/2016 0744   VLDL 7 10/03/2016 0744   LDLCALC 67 10/03/2016 0744    Other Studies Reviewed Today:  TEE 10/09/2016: Study Conclusions  - Left ventricle: Systolic function was severely reduced. The estimated ejection fraction was in the range of 20% to 25%. - Aortic valve: Bicuspid. - Mitral valve: There was mild regurgitation. - Left atrium: There was a medium-sizedthrombus. There was spontaneous echo contrast (&quot;smoke&quot;). - Tricuspid valve: There was moderate regurgitation.  Impressions:  - There is evidence of thrombus in the left atrial appendage.  Cardioversion was not performed.  Cardiac catheterization 10/02/2016: Echo documentation of ejection fraction of 15-20%.  Dilated left ventricle with an LVEDP of 27 mm.  Moderate right heart pressure elevation with moderate pulmonary hypertension.  Normal epicardial coronary arteries.  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  Assessment and Plan:   Current medicines were  reviewed with the patient today.  No orders of the defined types were placed in this encounter.   Disposition:  Signed, Jonelle Sidle, MD, [facility] 07/11/2017 10:45 AM    [facility] [address] [place], [place] [address] Phone: [phone]; Fax: [phone]

## 2017-07-12 ENCOUNTER — Ambulatory Visit: Payer: Self-pay | Admitting: Cardiology

## 2017-07-16 ENCOUNTER — Encounter: Payer: Self-pay | Admitting: Cardiology

## 2017-07-18 ENCOUNTER — Encounter (HOSPITAL_COMMUNITY): Payer: Self-pay | Admitting: *Deleted

## 2017-07-18 ENCOUNTER — Other Ambulatory Visit: Payer: Self-pay

## 2017-07-18 ENCOUNTER — Observation Stay (HOSPITAL_COMMUNITY)
Admission: EM | Admit: 2017-07-18 | Discharge: 2017-07-19 | Disposition: A | Discharge status: Home / Self Care | Payer: Medicaid Other | Attending: Internal Medicine | Admitting: Internal Medicine

## 2017-07-18 ENCOUNTER — Emergency Department (HOSPITAL_COMMUNITY): Payer: Medicaid Other

## 2017-07-18 DIAGNOSIS — I252 Old myocardial infarction: Secondary | ICD-10-CM | POA: Insufficient documentation

## 2017-07-18 DIAGNOSIS — Z96652 Presence of left artificial knee joint: Secondary | ICD-10-CM | POA: Diagnosis not present

## 2017-07-18 DIAGNOSIS — I5043 Acute on chronic combined systolic (congestive) and diastolic (congestive) heart failure: Principal | ICD-10-CM

## 2017-07-18 DIAGNOSIS — N182 Chronic kidney disease, stage 2 (mild): Secondary | ICD-10-CM | POA: Insufficient documentation

## 2017-07-18 DIAGNOSIS — I509 Heart failure, unspecified: Secondary | ICD-10-CM

## 2017-07-18 DIAGNOSIS — I4891 Unspecified atrial fibrillation: Secondary | ICD-10-CM | POA: Diagnosis present

## 2017-07-18 DIAGNOSIS — Z8673 Personal history of transient ischemic attack (TIA), and cerebral infarction without residual deficits: Secondary | ICD-10-CM | POA: Diagnosis not present

## 2017-07-18 DIAGNOSIS — F1010 Alcohol abuse, uncomplicated: Secondary | ICD-10-CM | POA: Diagnosis present

## 2017-07-18 DIAGNOSIS — R0602 Shortness of breath: Secondary | ICD-10-CM | POA: Diagnosis present

## 2017-07-18 LAB — CBC WITH DIFFERENTIAL/PLATELET
Basophils Absolute: 0 10*3/uL (ref 0.0–0.1)
Basophils Relative: 0 %
Eosinophils Absolute: 0 10*3/uL (ref 0.0–0.7)
Eosinophils Relative: 0 %
HCT: 40.9 % (ref 39.0–52.0)
Hemoglobin: 13.2 g/dL (ref 13.0–17.0)
Lymphocytes Relative: 25 %
Lymphs Abs: 1.8 10*3/uL (ref 0.7–4.0)
MCH: 30.4 pg (ref 26.0–34.0)
MCHC: 32.3 g/dL (ref 30.0–36.0)
MCV: 94.2 fL (ref 78.0–100.0)
Monocytes Absolute: 0.7 10*3/uL (ref 0.1–1.0)
Monocytes Relative: 9 %
Neutro Abs: 4.8 10*3/uL (ref 1.7–7.7)
Neutrophils Relative %: 66 %
Platelets: 305 10*3/uL (ref 150–400)
RBC: 4.34 MIL/uL (ref 4.22–5.81)
RDW: 15.5 % (ref 11.5–15.5)
WBC: 7.4 10*3/uL (ref 4.0–10.5)

## 2017-07-18 LAB — COMPREHENSIVE METABOLIC PANEL
ALT: 213 U/L — ABNORMAL HIGH (ref 17–63)
AST: 118 U/L — ABNORMAL HIGH (ref 15–41)
Albumin: 2.9 g/dL — ABNORMAL LOW (ref 3.5–5.0)
Alkaline Phosphatase: 132 U/L — ABNORMAL HIGH (ref 38–126)
Anion gap: 11 (ref 5–15)
BUN: 32 mg/dL — ABNORMAL HIGH (ref 6–20)
CO2: 22 mmol/L (ref 22–32)
Calcium: 8.4 mg/dL — ABNORMAL LOW (ref 8.9–10.3)
Chloride: 106 mmol/L (ref 101–111)
Creatinine, Ser: 1.41 mg/dL — ABNORMAL HIGH (ref 0.61–1.24)
GFR calc Af Amer: >60 mL/min (ref >60)
GFR calc non Af Amer: 55 mL/min — ABNORMAL LOW (ref >60)
Glucose, Bld: 143 mg/dL — ABNORMAL HIGH (ref 65–99)
Potassium: 3.1 mmol/L — ABNORMAL LOW (ref 3.5–5.1)
Sodium: 139 mmol/L (ref 135–145)
Total Bilirubin: 1.2 mg/dL (ref 0.3–1.2)
Total Protein: 6.4 g/dL — ABNORMAL LOW (ref 6.5–8.1)

## 2017-07-18 LAB — PROTIME-INR
INR: 2.95
Prothrombin Time: 30.5 seconds — ABNORMAL HIGH (ref 11.4–15.2)

## 2017-07-18 LAB — TROPONIN I: Troponin I: 0.04 ng/mL (ref <0.03)

## 2017-07-18 LAB — DIGOXIN LEVEL: Digoxin Level: <0.2 ng/mL — ABNORMAL LOW (ref 0.8–2.0)

## 2017-07-18 LAB — BRAIN NATRIURETIC PEPTIDE: B Natriuretic Peptide: 1352 pg/mL — ABNORMAL HIGH (ref 0.0–100.0)

## 2017-07-18 MED ORDER — POTASSIUM CHLORIDE 20 MEQ/15ML (10%) PO SOLN
40.0000 meq | Freq: Two times a day (BID) | ORAL | Status: DC
  Administered 2017-07-18: 40 meq via ORAL
  Filled 2017-07-18: qty 30

## 2017-07-18 MED ORDER — ONDANSETRON HCL 4 MG/2ML IJ SOLN: 4.0000 mg | Freq: Four times a day (QID) | INTRAMUSCULAR | Status: DC | PRN

## 2017-07-18 MED ORDER — SODIUM CHLORIDE 0.9% FLUSH
3.0000 mL | Freq: Two times a day (BID) | INTRAVENOUS | Status: DC
  Administered 2017-07-18 – 2017-07-19 (×3): 3 mL via INTRAVENOUS

## 2017-07-18 MED ORDER — METOPROLOL SUCCINATE ER 25 MG PO TB24
25.0000 mg | ORAL_TABLET | Freq: Every day | ORAL | Status: DC
  Administered 2017-07-19: 25 mg via ORAL
  Filled 2017-07-18: qty 1

## 2017-07-18 MED ORDER — WARFARIN - PHARMACIST DOSING INPATIENT: Freq: Every day | Status: DC

## 2017-07-18 MED ORDER — ONDANSETRON HCL 4 MG PO TABS: 4.0000 mg | ORAL_TABLET | Freq: Four times a day (QID) | ORAL | Status: DC | PRN

## 2017-07-18 MED ORDER — FUROSEMIDE 10 MG/ML IJ SOLN
80.0000 mg | Freq: Once | INTRAMUSCULAR | Status: AC
  Administered 2017-07-18: 80 mg via INTRAVENOUS
  Filled 2017-07-18: qty 8

## 2017-07-18 MED ORDER — SODIUM CHLORIDE 0.9 % IV SOLN: 250.0000 mL | INTRAVENOUS | Status: DC | PRN

## 2017-07-18 MED ORDER — WARFARIN SODIUM 5 MG PO TABS: 5.0000 mg | ORAL_TABLET | Freq: Every day | ORAL | Status: DC

## 2017-07-18 MED ORDER — POTASSIUM CHLORIDE CRYS ER 20 MEQ PO TBCR
40.0000 meq | EXTENDED_RELEASE_TABLET | Freq: Two times a day (BID) | ORAL | Status: DC
  Administered 2017-07-18 – 2017-07-19 (×2): 40 meq via ORAL
  Filled 2017-07-18 (×3): qty 2

## 2017-07-18 MED ORDER — POTASSIUM CHLORIDE CRYS ER 20 MEQ PO TBCR
40.0000 meq | EXTENDED_RELEASE_TABLET | Freq: Once | ORAL | Status: AC
  Administered 2017-07-18: 40 meq via ORAL
  Filled 2017-07-18: qty 2

## 2017-07-18 MED ORDER — FUROSEMIDE 10 MG/ML IJ SOLN
40.0000 mg | Freq: Once | INTRAMUSCULAR | Status: AC
  Administered 2017-07-18: 40 mg via INTRAVENOUS
  Filled 2017-07-18: qty 4

## 2017-07-18 MED ORDER — ALLOPURINOL 300 MG PO TABS
300.0000 mg | ORAL_TABLET | Freq: Every day | ORAL | Status: DC
  Administered 2017-07-18 – 2017-07-19 (×2): 300 mg via ORAL
  Filled 2017-07-18 (×2): qty 1

## 2017-07-18 MED ORDER — DIGOXIN 125 MCG PO TABS
0.1250 mg | ORAL_TABLET | Freq: Every day | ORAL | Status: DC
  Administered 2017-07-18 – 2017-07-19 (×2): 0.125 mg via ORAL
  Filled 2017-07-18 (×2): qty 1

## 2017-07-18 MED ORDER — WARFARIN SODIUM 5 MG PO TABS: 5.0000 mg | ORAL_TABLET | Freq: Once | ORAL | Status: DC

## 2017-07-18 MED ORDER — SODIUM CHLORIDE 0.9% FLUSH: 3.0000 mL | INTRAVENOUS | Status: DC | PRN

## 2017-07-18 MED ORDER — ACETAMINOPHEN 325 MG PO TABS: 650.0000 mg | ORAL_TABLET | Freq: Four times a day (QID) | ORAL | Status: DC | PRN

## 2017-07-18 MED ORDER — ACETAMINOPHEN 650 MG RE SUPP: 650.0000 mg | Freq: Four times a day (QID) | RECTAL | Status: DC | PRN

## 2017-07-18 NOTE — ED Notes (Signed)
Patient able to ambulate around nurses station with no noted SOB, oxygen saturation at 86-87% at room air.

## 2017-07-18 NOTE — Progress Notes (Signed)
ANTICOAGULATION CONSULT NOTE - Initial Consult  Pharmacy Consult for Coumadin Indication: atrial fibrillation  Allergies  Allergen Reactions  . Azithromycin Itching    Patient Measurements: Height: 5' 11.5" (181.6 cm) Weight: 190 lb (86.2 kg) IBW/kg (Calculated) : 76.45  Vital Signs: Temp: 97.8 F (36.6 C) (04/11 0910) Temp Source: Oral (04/11 0910) BP: 114/88 (04/11 1400) Pulse Rate: 65 (04/11 1400)  Labs: Recent Labs    07/18/17 1030  HGB 13.2  HCT 40.9  PLT 305  LABPROT 30.5*  INR 2.95  CREATININE 1.41*  TROPONINI 0.04*    Estimated Creatinine Clearance: 64.1 mL/min (A) (by C-G formula based on SCr of 1.41 mg/dL (H)).   Medical History: Past Medical History:  Diagnosis Date  . Alcohol abuse   . Arthritis    Bilateral knees   . Atrial fibrillation (HCC)   . Chronic kidney disease, stage 2, mildly decreased GFR   . GERD (gastroesophageal reflux disease)   . Gout   . Nonischemic cardiomyopathy (HCC)    a. LVEF 20-25% in 2013, improved to 50% on medical therapy. b. 09/2016: echo showing reduced EF of 15-20% and cath showing normal cors.   Marland Kitchen TIA (transient ischemic attack)     Medications:  See med rec  Assessment: 57 y.o. male with medical history significant for persistent atrial fibrillation, CKD stage II, gout, GERD, alcohol abuse, and nonischemic cardiomyopathy with EF 20-25% and prior left atrial appendage thrombus who presented to the emergency department with worsening bilateral lower extremity edema as well as persistent shortness of breath over the last 2-3 days.   He is compliant with his warfarin, metoprolol, and daily Lasix, but recently ran out of his Medicaid and has not been able to afford his digoxin and therefore has not been taking this over the last few days. Pharmacy asked to monitor and dose Coumadin. INR is at goal but upper end of range. Last anticoag visit was 06/17/17 where he had an elevated INR.  Home dose is 7.5mg  po daily   Goal  of Therapy:  INR 2-3 Monitor platelets by anticoagulation protocol: Yes   Plan:  Coumadin 5mg  po x 1 today PT-INR daily Monitor for S/S of bleeding  Elder Cyphers, BS Loura Back, BCPS Clinical Pharmacist Pager [phone] 07/18/2017,3:52 PM

## 2017-07-18 NOTE — ED Triage Notes (Addendum)
Pt c/o bilateral leg swelling and pain, difficulty breathing with exertion x couple of days. Pt reports hx of CHF.

## 2017-07-18 NOTE — H&P (Addendum)
History and Physical    David [name] OTR:[id number] DOB: May 18, 1960 DOA: 07/18/2017  PCP: Salley Scarlet, MD   Patient coming from: Home  Chief Complaint: Leg edema and dyspnea  HPI: David [name] is a 57 y.o. male with medical history significant for persistent atrial fibrillation, CKD stage II, gout, GERD, alcohol abuse, and nonischemic cardiomyopathy with EF 20-25% and prior left atrial appendage thrombus who presented to the emergency department with worsening bilateral lower extremity edema as well as persistent shortness of breath over the last 2-3 days.  He actually states that he has been more fatigued and somewhat short of breath over the last 1 month.  He appears to be compliant with his warfarin, metoprolol, and daily Lasix, but recently ran out of his Medicaid and has not been able to afford his digoxin and therefore has not been taking this over the last few days.  Aside from this, he has been carefully watching his fluid intake as well as his sodium intake. He has a mild nonproductive cough along with some orthopnea, denies fever or chills. He denies any chest discomfort, palpitations, lightheadedness, or dizziness.   ED Course: Vital signs are stable and patient is noted to be minimally tachycardic with heart rate in the 100 beat per minute range on telemetry.  Laboratory data remarkable for potassium 3.1, creatinine 1.4 (which is near his baseline), troponin 0.04, and BNP 1352.  Two-view chest x-ray with no acute findings noted.  EKG with atrial fibrillation at 80 bpm.  He has received an 80 mg IV Lasix dose with great improvement noted thus far but continues to have some shortness of breath and hypoxemia with O2 saturation in the high 80th percentile with ambulation.  Review of Systems: All others reviewed and otherwise negative.  Past Medical History:  Diagnosis Date  . Alcohol abuse   . Arthritis    Bilateral knees   . Atrial fibrillation (HCC)   . Chronic  kidney disease, stage 2, mildly decreased GFR   . GERD (gastroesophageal reflux disease)   . Gout   . Nonischemic cardiomyopathy (HCC)    a. LVEF 20-25% in 2013, improved to 50% on medical therapy. b. 09/2016: echo showing reduced EF of 15-20% and cath showing normal cors.   Marland Kitchen TIA (transient ischemic attack)     Past Surgical History:  Procedure Laterality Date  . Arthroscopic knee surgery Right 1987  . CARDIOVERSION  10/08/2011   Procedure: CARDIOVERSION;  Surgeon: Jonelle Sidle, MD;  Location: [facility];  Service: Cardiovascular;  Laterality: N/A;  Done in PACU  . CARDIOVERSION  01/10/2012   Procedure: CARDIOVERSION;  Surgeon: Marinus Maw, MD;  Location: [facility] ENDOSCOPY;  Service: Cardiovascular;  Laterality: N/A;  . CARDIOVERSION N/A 11/17/2014   Procedure: CARDIOVERSION;  Surgeon: Laqueta Linden, MD;  Location: [facility]S;  Service: Cardiovascular;  Laterality: N/A;  . RIGHT/LEFT HEART CATH AND CORONARY ANGIOGRAPHY N/A 10/02/2016   Procedure: Right/Left Heart Cath and Coronary Angiography;  Surgeon: Lennette Bihari, MD;  Location: [facility] INVASIVE CV LAB;  Service: Cardiovascular;  Laterality: N/A;  . TEE WITHOUT CARDIOVERSION N/A 11/17/2014   Procedure: TRANSESOPHAGEAL ECHOCARDIOGRAM (TEE);  Surgeon: Laqueta Linden, MD;  Location: [facility];  Service: Cardiovascular;  Laterality: N/A;  . TEE WITHOUT CARDIOVERSION N/A 10/09/2016   Procedure: TRANSESOPHAGEAL ECHOCARDIOGRAM (TEE);  Surgeon: Elease Hashimoto Deloris Ping, MD;  Location: [facility] ENDOSCOPY;  Service: Cardiovascular;  Laterality: N/A;  . TOTAL KNEE ARTHROPLASTY Left 01/16/2013   Procedure: TOTAL  KNEE ARTHROPLASTY;  Surgeon: Vickki Hearing, MD;  Location: [facility];  Service: Orthopedics;  Laterality: Left;     reports that he has never smoked. He has quit using smokeless tobacco. His smokeless tobacco use included snuff. He reports that he drinks about 8.4 oz of alcohol per week. He reports that he has current or past drug history. Drug:  Marijuana.  Allergies  Allergen Reactions  . Azithromycin Itching    Family History  Problem Relation Age of Onset  . Hypertension Mother   . Hypertension Sister   . Hypertension Brother     Prior to Admission medications   Medication Sig Start Date End Date Taking? Authorizing Provider  allopurinol (ZYLOPRIM) 300 MG tablet TAKE 1 TABLET BY MOUTH ONCE DAILY 05/28/17  Yes Wendall Stade, MD  digoxin (LANOXIN) 0.125 MG tablet Take 1 tablet (0.125 mg total) by mouth daily. 05/17/17  Yes Strader, [place] M, PA-C  furosemide (LASIX) 40 MG tablet Take 1 tablet (40 mg total) by mouth daily. 05/17/17  Yes Strader, [place] M, PA-C  metoprolol succinate (TOPROL XL) 25 MG 24 hr tablet Take 1 tablet (25 mg total) by mouth daily. 05/17/17  Yes Strader, Lennart Pall, PA-C  warfarin (COUMADIN) 5 MG tablet TAKE 1 & 1/2 (ONE & ONE-HALF) TABLETS BY MOUTH ONCE DAILY OR AS DIRECTED BY COUMADIN CLINIC 05/24/17  Yes Jonelle Sidle, MD    Physical Exam: Vitals:   07/18/17 0911 07/18/17 1145 07/18/17 1230 07/18/17 1400  BP:  (!) 107/94 108/86 114/88  Pulse:  95 (!) 35 65  Resp:  14 (!) 22 18  Temp:      TempSrc:      SpO2:  95% 97% 100%  Weight: 86.2 kg (190 lb)     Height: 5' 11.5" (1.816 m)       Constitutional: NAD, calm, comfortable Vitals:   07/18/17 0911 07/18/17 1145 07/18/17 1230 07/18/17 1400  BP:  (!) 107/94 108/86 114/88  Pulse:  95 (!) 35 65  Resp:  14 (!) 22 18  Temp:      TempSrc:      SpO2:  95% 97% 100%  Weight: 86.2 kg (190 lb)     Height: 5' 11.5" (1.816 m)      Eyes: lids and conjunctivae normal ENMT: Mucous membranes are moist.  Neck: normal, supple Respiratory: clear to auscultation bilaterally. Normal respiratory effort. No accessory muscle use. Currently on RA. Cardiovascular: Regular rate and rhythm, no murmurs. Mild, 1+ bilateral pitting edema. Abdomen: no tenderness, no distention. Bowel sounds positive.  Musculoskeletal:  No joint deformity upper and lower  extremities.   Skin: no rashes, lesions, ulcers.  Psychiatric: Normal judgment and insight. Alert and oriented x 3. Normal mood.   Labs on Admission: I have personally reviewed following labs and imaging studies  CBC: Recent Labs  Lab 07/18/17 1030  WBC 7.4  NEUTROABS 4.8  HGB 13.2  HCT 40.9  MCV 94.2  PLT 305   Basic Metabolic Panel: Recent Labs  Lab 07/18/17 1030  NA 139  K 3.1*  CL 106  CO2 22  GLUCOSE 143*  BUN 32*  CREATININE 1.41*  CALCIUM 8.4*   GFR: Estimated Creatinine Clearance: 64.1 mL/min (A) (by C-G formula based on SCr of 1.41 mg/dL (H)). Liver Function Tests: Recent Labs  Lab 07/18/17 1030  AST 118*  ALT 213*  ALKPHOS 132*  BILITOT 1.2  PROT 6.4*  ALBUMIN 2.9*   No results for input(s): LIPASE, AMYLASE in the  last 168 hours. No results for input(s): AMMONIA in the last 168 hours. Coagulation Profile: Recent Labs  Lab 07/18/17 1030  INR 2.95   Cardiac Enzymes: Recent Labs  Lab 07/18/17 1030  TROPONINI 0.04*   BNP (last 3 results) No results for input(s): PROBNP in the last 8760 hours. HbA1C: No results for input(s): HGBA1C in the last 72 hours. CBG: No results for input(s): GLUCAP in the last 168 hours. Lipid Profile: No results for input(s): CHOL, HDL, LDLCALC, TRIG, CHOLHDL, LDLDIRECT in the last 72 hours. Thyroid Function Tests: No results for input(s): TSH, T4TOTAL, FREET4, T3FREE, THYROIDAB in the last 72 hours. Anemia Panel: No results for input(s): VITAMINB12, FOLATE, FERRITIN, TIBC, IRON, RETICCTPCT in the last 72 hours. Urine analysis: No results found for: COLORURINE, APPEARANCEUR, LABSPEC, PHURINE, GLUCOSEU, HGBUR, BILIRUBINUR, KETONESUR, PROTEINUR, UROBILINOGEN, NITRITE, LEUKOCYTESUR  Radiological Exams on Admission: Dg Chest 2 View  Result Date: 07/18/2017 CLINICAL DATA:  Shortness of breath.  Lower extremity edema EXAM: CHEST - 2 VIEW COMPARISON:  October 01, 2016 FINDINGS: There is no edema or consolidation. Heart is  mildly enlarged with pulmonary vascularity within normal limits. No adenopathy. No bone lesions. IMPRESSION: Stable cardiac prominence.  No edema or consolidation. Electronically Signed   By: Bretta Bang [name] M.D.   On: 07/18/2017 10:22    EKG: Independently reviewed. Afib at 88bpm.  Assessment/Plan Principal Problem:   Acute on chronic systolic and diastolic heart failure, NYHA class 3 (HCC) Active Problems:   Atrial fibrillation (HCC)   Alcohol abuse    1. Acute on chronic combined heart failure.  Patient given 80 mg of IV Lasix in ED with close to 70% improvement already.  Will give 1 more dose of IV 40 mg Lasix during admission.  Continue to monitor weights as well as intake and output.  Repeat 2D echocardiogram will be avoided at this time due to his elevated heart rate and poor compliance with digoxin.  Prior echo was on 10/2016 with EF 20-25%.  Will ensure follow-up with Dr. Diona Browner shortly after this visit. 2. Persistent atrial fibrillation.  Continue on warfarin for anticoagulation with noted therapeutic INR.  Maintain on metoprolol for rate control.  Maintain on telemetry. 3. Hypokalemia. Replete orally. Recheck in am with Mg. 4. Chronic gout.  Maintain on allopurinol. 5. History of alcohol abuse.  He does not appear to have DTs or any form of agitation.  Ativan as needed.  Monitor closely.   DVT prophylaxis: Warfarin Code Status: Full Family Communication: None at bedside; lives with wife at home Disposition Plan:Diuresis to symptomatic improvement Consults called:None Admission status: Obs, tele   Shana Younge Hoover Brunette DO [facility] Pager [phone]  If 7PM-7AM, please contact night-coverage [email or web address] Password [facility]  07/18/2017, 3:03 PM

## 2017-07-18 NOTE — Progress Notes (Signed)
Pt given urinal to monitor output. Pt stated he was told in the ER that his urine did not need to be measure. Adv pt MD placed or for I&O's. Pt verbalized understanding. Will continue to monitor pt

## 2017-07-18 NOTE — ED Provider Notes (Signed)
Emergency Department Provider Note   I have reviewed the triage vital signs and the nursing notes.   HISTORY  Chief Complaint Leg Swelling   HPI David [name] is a 57 y.o. male With a past medical history of CHF most recent echocardiogram in July with a EF of 20-25%.  Also has a history of atrial fibrillation.  Has been on and off some of his medications secondary to some insurance and pharmacy issues.  Has not been taking digoxin recently.  Has missed a couple doses of warfarin but thinks that he is taking his metoprolol and Lasix as scheduled.  He is here with a month of worsening shortness of breath but yesterday he has significant leg swelling as well and his boss told him he is see Dr. for coming back if it had not improved.  His breathing seems to be worse when he exerts himself but does not have any chest pain with it.  This is been intermittently worsening over the last month but is been severe over the last couple days.  He is requiring multiple breaks just to take his breath. No other associated or modifying symptoms.    Past Medical History:  Diagnosis Date  . Alcohol abuse   . Arthritis    Bilateral knees   . Atrial fibrillation (HCC)   . Chronic kidney disease, stage 2, mildly decreased GFR   . GERD (gastroesophageal reflux disease)   . Gout   . Nonischemic cardiomyopathy (HCC)    a. LVEF 20-25% in 2013, improved to 50% on medical therapy. b. 09/2016: echo showing reduced EF of 15-20% and cath showing normal cors.   Marland Kitchen TIA (transient ischemic attack)     Patient Active Problem List   Diagnosis Date Noted  . CHF (congestive heart failure) (HCC) 07/18/2017  . Normal coronary arteries 11/27/2016  . Demand ischemia (HCC)   . NSTEMI (non-ST elevated myocardial infarction) (HCC) 10/02/2016  . Acute on chronic systolic and diastolic heart failure, NYHA class 3 (HCC) 10/02/2016  . Dyspnea 10/01/2016  . Persistent atrial fibrillation (HCC)   . BPH (benign  prostatic hypertrophy) 02/17/2014  . Head injury, unspecified 12/09/2013  . Alcohol abuse 12/09/2013  . Neck strain 12/09/2013  . Acid reflux 12/09/2013  . Encounter for therapeutic drug monitoring 05/28/2013  . S/P total knee replacement 04/07/2013  . Stiffness of left knee 02/05/2013  . Status post left knee replacement 01/29/2013  . OA (osteoarthritis) of knee 10/28/2012  . Routine general medical examination at a health care facility 09/25/2012  . Hyperlipidemia 09/25/2012  . Chronic systolic (congestive) heart failure (HCC) 01/21/2012  . Long term (current) use of anticoagulants 06/21/2011  . Atrial fibrillation (HCC) 06/15/2011  . NICM (nonischemic cardiomyopathy) (HCC) 06/15/2011    Past Surgical History:  Procedure Laterality Date  . Arthroscopic knee surgery Right 1987  . CARDIOVERSION  10/08/2011   Procedure: CARDIOVERSION;  Surgeon: Jonelle Sidle, MD;  Location: [facility]S;  Service: Cardiovascular;  Laterality: N/A;  Done in PACU  . CARDIOVERSION  01/10/2012   Procedure: CARDIOVERSION;  Surgeon: Marinus Maw, MD;  Location: [facility] ENDOSCOPY;  Service: Cardiovascular;  Laterality: N/A;  . CARDIOVERSION N/A 11/17/2014   Procedure: CARDIOVERSION;  Surgeon: Laqueta Linden, MD;  Location: [facility]S;  Service: Cardiovascular;  Laterality: N/A;  . RIGHT/LEFT HEART CATH AND CORONARY ANGIOGRAPHY N/A 10/02/2016   Procedure: Right/Left Heart Cath and Coronary Angiography;  Surgeon: Lennette Bihari, MD;  Location: [facility] INVASIVE CV LAB;  Service: Cardiovascular;  Laterality: N/A;  . TEE WITHOUT CARDIOVERSION N/A 11/17/2014   Procedure: TRANSESOPHAGEAL ECHOCARDIOGRAM (TEE);  Surgeon: Laqueta Linden, MD;  Location: [facility]S;  Service: Cardiovascular;  Laterality: N/A;  . TEE WITHOUT CARDIOVERSION N/A 10/09/2016   Procedure: TRANSESOPHAGEAL ECHOCARDIOGRAM (TEE);  Surgeon: Elease Hashimoto Deloris Ping, MD;  Location: [facility] ENDOSCOPY;  Service: Cardiovascular;  Laterality: N/A;  . TOTAL KNEE ARTHROPLASTY Left  01/16/2013   Procedure: TOTAL KNEE ARTHROPLASTY;  Surgeon: Vickki Hearing, MD;  Location: [facility];  Service: Orthopedics;  Laterality: Left;    Current Outpatient Rx  . Order #: [id number] Class: Normal  . Order #: [id number] Class: Normal  . Order #: [id number] Class: Normal  . Order #: [id number] Class: Normal  . Order #: [id number] Class: Normal    Allergies Azithromycin  Family History  Problem Relation Age of Onset  . Hypertension Mother   . Hypertension Sister   . Hypertension Brother     Social History Social History   Tobacco Use  . Smoking status: Never Smoker  . Smokeless tobacco: Former Neurosurgeon    Types: Snuff  . Tobacco comment: quit 1980's  Substance Use Topics  . Alcohol use: Yes    Alcohol/week: 8.4 oz    Types: 14 Cans of beer per week    Comment: 1-2 beers daily   . Drug use: Not Currently    Types: Marijuana    Comment: last time 2 years ago    Review of Systems  All other systems negative except as documented in the HPI. All pertinent positives and negatives as reviewed in the HPI. ____________________________________________   PHYSICAL EXAM:  VITAL SIGNS: ED Triage Vitals  Enc Vitals Group     BP 07/18/17 0910 (!) 123/99     Pulse Rate 07/18/17 0910 87     Resp 07/18/17 0910 18     Temp 07/18/17 0910 97.8 F (36.6 C)     Temp Source 07/18/17 0910 Oral     SpO2 07/18/17 0910 99 %     Weight 07/18/17 0911 190 lb (86.2 kg)     Height 07/18/17 0911 5' 11.5" (1.816 m)    Constitutional: Alert and oriented. Well appearing and in no acute distress. Eyes: Conjunctivae are normal. PERRL. EOMI. Head: Atraumatic. Nose: No congestion/rhinnorhea. Mouth/Throat: Mucous membranes are moist.  Oropharynx non-erythematous. Neck: No stridor.  No meningeal signs.   Cardiovascular: Normal rate, irregular rhythm. Good peripheral circulation. Grossly normal heart sounds.   Respiratory: Normal respiratory effort.  No retractions. Lungs very mild rales to the  bases. Gastrointestinal: Soft and nontender. No distention.  Musculoskeletal: No lower extremity tenderness but has bilateral lower extremity edema pitting 1+ to the mid shin. No gross deformities of extremities. Neurologic:  Normal speech and language. No gross focal neurologic deficits are appreciated.  Skin:  Skin is warm, dry and intact. No rash noted.   ____________________________________________   LABS (all labs ordered are listed, but only abnormal results are displayed)  Labs Reviewed  COMPREHENSIVE METABOLIC PANEL - Abnormal; Notable for the following components:      Result Value   Potassium 3.1 (*)    Glucose, Bld 143 (*)    BUN 32 (*)    Creatinine, Ser 1.41 (*)    Calcium 8.4 (*)    Total Protein 6.4 (*)    Albumin 2.9 (*)    AST 118 (*)    ALT 213 (*)    Alkaline Phosphatase 132 (*)    GFR calc non Af Amer 55 (*)  All other components within normal limits  DIGOXIN LEVEL - Abnormal; Notable for the following components:   Digoxin Level <0.2 (*)    All other components within normal limits  PROTIME-INR - Abnormal; Notable for the following components:   Prothrombin Time 30.5 (*)    All other components within normal limits  TROPONIN I - Abnormal; Notable for the following components:   Troponin I 0.04 (*)    All other components within normal limits  BRAIN NATRIURETIC PEPTIDE - Abnormal; Notable for the following components:   B Natriuretic Peptide 1,352.0 (*)    All other components within normal limits  CBC WITH DIFFERENTIAL/PLATELET  HIV ANTIBODY (ROUTINE TESTING)   ____________________________________________  EKG   EKG Interpretation  Date/Time:  Thursday July 18 2017 10:24:37 EDT Ventricular Rate:  88 PR Interval:    QRS Duration: 101 QT Interval:  389 QTC Calculation: 471 R Axis:   68 Text Interpretation:  Atrial fibrillation Ventricular premature complex Consider anterior infarct Abnormal T, consider ischemia, lateral leads no change  since june 2018 Confirmed by Marily Memos [id number]) on 07/18/2017 10:53:13 AM       ____________________________________________  RADIOLOGY  Dg Chest 2 View  Result Date: 07/18/2017 CLINICAL DATA:  Shortness of breath.  Lower extremity edema EXAM: CHEST - 2 VIEW COMPARISON:  October 01, 2016 FINDINGS: There is no edema or consolidation. Heart is mildly enlarged with pulmonary vascularity within normal limits. No adenopathy. No bone lesions. IMPRESSION: Stable cardiac prominence.  No edema or consolidation. Electronically Signed   By: Bretta Bang [name] M.D.   On: 07/18/2017 10:22    ____________________________________________   PROCEDURES  Procedure(s) performed:   Procedures   ____________________________________________   INITIAL IMPRESSION / ASSESSMENT AND PLAN / ED COURSE  eval digoxin level, INR, bnp/troponin/cxr. If all relatively unremarkable will proceed with ED diuresis and likely cardiology follow up.  Patient with what appears to be acute CHF exacerbation.  80 of IV Lasix given and he diuresed well however had persistent tachypnea especially on exertion along with hypoxia of 86% while ambulating.  Discussed with the patient who states he does not feel much improved overall so will admit for the hospital to continue diuresis.  Pertinent labs & imaging results that were available during my care of the patient were reviewed by me and considered in my medical decision making (see chart for details).  ____________________________________________  FINAL CLINICAL IMPRESSION(S) / ED DIAGNOSES  Final diagnoses:  Acute on chronic congestive heart failure, unspecified heart failure type (HCC)     MEDICATIONS GIVEN DURING THIS VISIT:  Medications  allopurinol (ZYLOPRIM) tablet 300 mg (has no administration in time range)  digoxin (LANOXIN) tablet 0.125 mg (has no administration in time range)  metoprolol succinate (TOPROL-XL) 24 hr tablet 25 mg (has no administration  in time range)  sodium chloride flush (NS) 0.9 % injection 3 mL (has no administration in time range)  sodium chloride flush (NS) 0.9 % injection 3 mL (has no administration in time range)  0.9 %  sodium chloride infusion (has no administration in time range)  acetaminophen (TYLENOL) tablet 650 mg (has no administration in time range)    Or  acetaminophen (TYLENOL) suppository 650 mg (has no administration in time range)  ondansetron (ZOFRAN) tablet 4 mg (has no administration in time range)    Or  ondansetron (ZOFRAN) injection 4 mg (has no administration in time range)  furosemide (LASIX) injection 40 mg (has no administration in time range)  potassium chloride 20  MEQ/15ML (10%) solution 40 mEq (has no administration in time range)  warfarin (COUMADIN) tablet 5 mg (has no administration in time range)  Warfarin - Pharmacist Dosing Inpatient (has no administration in time range)  furosemide (LASIX) injection 80 mg (80 mg Intravenous Given 07/18/17 1204)  potassium chloride SA (K-DUR,KLOR-CON) CR tablet 40 mEq (40 mEq Oral Given 07/18/17 1322)     NEW OUTPATIENT MEDICATIONS STARTED DURING THIS VISIT:  New Prescriptions   No medications on file    Note:  This note was prepared with assistance of Dragon voice recognition software. Occasional wrong-word or sound-a-like substitutions may have occurred due to the inherent limitations of voice recognition software.   Marily Memos, MD 07/18/17 [phone]

## 2017-07-18 NOTE — ED Notes (Signed)
CRITICAL VALUE ALERT  Critical Value:  Troponin 0.04  Date & Time Notied:  07/18/17 1121  Provider Notified: Dr. Clayborne Dana  Orders Received/Actions taken: EDP notified, no further orders given

## 2017-07-18 NOTE — Progress Notes (Signed)
Central Telemetry contacted this nurse, reported patient had a 6 beat run of V-tach, MD notified.

## 2017-07-19 DIAGNOSIS — I5043 Acute on chronic combined systolic (congestive) and diastolic (congestive) heart failure: Secondary | ICD-10-CM | POA: Diagnosis not present

## 2017-07-19 LAB — PROTIME-INR
INR: 3.22
Prothrombin Time: 32.7 seconds — ABNORMAL HIGH (ref 11.4–15.2)

## 2017-07-19 LAB — BASIC METABOLIC PANEL
Anion gap: 11 (ref 5–15)
BUN: 31 mg/dL — ABNORMAL HIGH (ref 6–20)
CO2: 24 mmol/L (ref 22–32)
Calcium: 8.1 mg/dL — ABNORMAL LOW (ref 8.9–10.3)
Chloride: 104 mmol/L (ref 101–111)
Creatinine, Ser: 1.4 mg/dL — ABNORMAL HIGH (ref 0.61–1.24)
GFR calc Af Amer: >60 mL/min (ref >60)
GFR calc non Af Amer: 55 mL/min — ABNORMAL LOW (ref >60)
Glucose, Bld: 143 mg/dL — ABNORMAL HIGH (ref 65–99)
Potassium: 3.3 mmol/L — ABNORMAL LOW (ref 3.5–5.1)
Sodium: 139 mmol/L (ref 135–145)

## 2017-07-19 LAB — HIV ANTIBODY (ROUTINE TESTING W REFLEX): HIV Screen 4th Generation wRfx: NONREACTIVE

## 2017-07-19 LAB — CBC
HCT: 38.6 % — ABNORMAL LOW (ref 39.0–52.0)
Hemoglobin: 12 g/dL — ABNORMAL LOW (ref 13.0–17.0)
MCH: 29.8 pg (ref 26.0–34.0)
MCHC: 31.1 g/dL (ref 30.0–36.0)
MCV: 95.8 fL (ref 78.0–100.0)
Platelets: 308 10*3/uL (ref 150–400)
RBC: 4.03 MIL/uL — ABNORMAL LOW (ref 4.22–5.81)
RDW: 15.9 % — ABNORMAL HIGH (ref 11.5–15.5)
WBC: 7.5 10*3/uL (ref 4.0–10.5)

## 2017-07-19 LAB — MAGNESIUM: Magnesium: 1.3 mg/dL — ABNORMAL LOW (ref 1.7–2.4)

## 2017-07-19 MED ORDER — POTASSIUM CHLORIDE 20 MEQ/15ML (10%) PO SOLN
40.0000 meq | Freq: Once | ORAL | Status: DC
  Filled 2017-07-19: qty 30

## 2017-07-19 MED ORDER — DIGOXIN 125 MCG PO TABS: 0.1250 mg | ORAL_TABLET | Freq: Every day | ORAL | 3 refills | Status: DC

## 2017-07-19 MED ORDER — METOPROLOL TARTRATE 25 MG PO TABS: 25.0000 mg | ORAL_TABLET | Freq: Three times a day (TID) | ORAL | 11 refills | Status: DC

## 2017-07-19 MED ORDER — MAGNESIUM SULFATE 2 GM/50ML IV SOLN
2.0000 g | Freq: Once | INTRAVENOUS | Status: AC
  Administered 2017-07-19: 2 g via INTRAVENOUS
  Filled 2017-07-19: qty 50

## 2017-07-19 MED ORDER — POTASSIUM CHLORIDE CRYS ER 20 MEQ PO TBCR
40.0000 meq | EXTENDED_RELEASE_TABLET | Freq: Once | ORAL | Status: AC
  Administered 2017-07-19: 40 meq via ORAL

## 2017-07-19 NOTE — Care Management Note (Signed)
Case Management Note  Patient Details  Name: David [name] MRN: [id number] Date of Birth: 03/30/1961  Subjective/Objective:          Admitted with CHF. Pt from home. CM consult for medication assistance, unable to afford Dignoxin per MD.           Action/Plan: Per pt his Medicaid had lapsed but is now active again. He will be able to afford all medications at DC.   Expected Discharge Date:  07/19/17               Expected Discharge Plan:  Home/Self Care  In-House Referral:  NA  Discharge planning Services  CM Consult  Post Acute Care Choice:  NA Choice offered to:  NA  Status of Service:  Completed, signed off  Malcolm Metro, RN 07/19/2017, 1:24 PM

## 2017-07-19 NOTE — Progress Notes (Signed)
ANTICOAGULATION CONSULT NOTE   Pharmacy Consult for Coumadin Indication: atrial fibrillation  Allergies  Allergen Reactions  . Azithromycin Itching   Patient Measurements: Height: 5' 11.5" (181.6 cm) Weight: 190 lb 11.2 oz (86.5 kg) IBW/kg (Calculated) : 76.45  Vital Signs: BP: 129/102 (04/12 0838) Pulse Rate: 91 (04/12 0838)  Labs: Recent Labs    07/18/17 1030 07/19/17 0419  HGB 13.2 12.0*  HCT 40.9 38.6*  PLT 305 308  LABPROT 30.5* 32.7*  INR 2.95 3.22  CREATININE 1.41* 1.40*  TROPONINI 0.04*  --    Estimated Creatinine Clearance: 64.5 mL/min (A) (by C-G formula based on SCr of 1.4 mg/dL (H)).  Medical History: Past Medical History:  Diagnosis Date  . Alcohol abuse   . Arthritis    Bilateral knees   . Atrial fibrillation (HCC)   . Chronic kidney disease, stage 2, mildly decreased GFR   . GERD (gastroesophageal reflux disease)   . Gout   . Nonischemic cardiomyopathy (HCC)    a. LVEF 20-25% in 2013, improved to 50% on medical therapy. b. 09/2016: echo showing reduced EF of 15-20% and cath showing normal cors.   Marland Kitchen TIA (transient ischemic attack)    Medications:  See med rec  Assessment: 57 y.o. male with medical history significant for persistent atrial fibrillation, CKD stage II, gout, GERD, alcohol abuse, and nonischemic cardiomyopathy with EF 20-25% and prior left atrial appendage thrombus who presented to the emergency department with worsening bilateral lower extremity edema as well as persistent shortness of breath over the last 2-3 days.   He is compliant with his warfarin, metoprolol, and daily Lasix, but recently ran out of his Medicaid and has not been able to afford his digoxin and therefore has not been taking this over the last few days. Pharmacy asked to monitor and dose Coumadin. INR is at goal but upper end of range. Last anticoag visit was 06/17/17 where he had an elevated INR.  Home dose is 7.5mg  po daily   Goal of Therapy:  INR 2-3 Monitor  platelets by anticoagulation protocol: Yes   Plan:  HOLD coumadin today, allow INR to trend down PT-INR daily Monitor for S/S of bleeding  Valrie Hart, PharmD Clinical Pharmacist Pager:  [phone] 07/19/2017   07/19/2017,10:57 AM

## 2017-07-19 NOTE — Progress Notes (Signed)
Pt ambulated in hallway without difficulty, SPO2 is 97-100% if pt not talking while ambulating.  If pt attempts conversation while ambulating SPO2 93-97%.  Pt SPO2 99-100% at rest.  AKingBSNRN

## 2017-07-19 NOTE — Progress Notes (Signed)
Pt discharged to home in stable condition.  All discharge instructions and Rx's reviewed with pt.  RX's to be picked up from pharmacy.  AKingBSNRN

## 2017-07-19 NOTE — Discharge Summary (Addendum)
Physician Discharge Summary  [name]:[id number] DOB: 15-Sep-1960 DOA: 07/18/2017  PCP: Salley Scarlet, MD  Admit date: 07/18/2017  Discharge date: 07/19/2017  Admitted From:Home  Disposition:  Home  Recommendations for Outpatient Follow-up:  1. Follow up with PCP in 1 month 2. Follow-up with cardiologist Dr. Diona Browner as scheduled on 4/26  Home Health: None  Equipment/Devices: None  Discharge Condition:Stable  CODE STATUS: Full  Diet recommendation: Heart Healthy  Brief/Interim Summary:  David [name] is a 57 y.o. male with medical history significant for persistent atrial fibrillation, CKD stage II, gout, GERD, alcohol abuse, and nonischemic cardiomyopathy with EF 20-25% and prior left atrial appendage thrombus who presented to the emergency department with worsening bilateral lower extremity edema as well as persistent shortness of breath over the last 2-3 days.  He was admitted with acute on chronic combined heart failure and was diuresed with IV Lasix with great improvement.  He no longer has any hypoxemia with ambulation.  He was noted to have poor compliance with his home digoxin which has been addressed by care management.  He will continue the rest of his home medications as otherwise prescribed and may return back to work.  He will have follow-up arranged with his cardiologist Dr. Diona Browner on 4/26.   Discharge Diagnoses:  Principal Problem:   Acute on chronic systolic and diastolic heart failure, NYHA class 3 (HCC) Active Problems:   Atrial fibrillation (HCC)   Alcohol abuse   CHF (congestive heart failure) (HCC)  1. Acute on chronic combined heart failure.    Resolved with diuresis.  Follow-up with cardiologist Dr. Diona Browner as scheduled.  Continue home Lasix as previously prescribed and monitor fluid and sodium intake as well as daily weights.  May consider repeat 2D echocardiogram and follow-up visit once patient noted to be stable on all home  medications. 2. Non-sustained Vtach episodes-asymptomatic. Changed home metoprolol XL 25mg  to metoprolol 25mg  TID. 3. Persistent atrial fibrillation.  Continue on warfarin for anticoagulation with noted therapeutic INR.  Maintain on metoprolol for rate control.  4. Hypokalemia.  Repleted along with magnesium. 5. Chronic gout.  Maintain on allopurinol. 6. History of alcohol abuse.    Counseled on cessation.   Discharge Instructions  Discharge Instructions    (HEART FAILURE PATIENTS) Call MD:  Anytime you have any of the following symptoms: 1) 3 pound weight gain in 24 hours or 5 pounds in 1 week 2) shortness of breath, with or without a dry hacking cough 3) swelling in the hands, feet or stomach 4) if you have to sleep on extra pillows at night in order to breathe.   Complete by:  As directed    Diet - low sodium heart healthy   Complete by:  As directed    Increase activity slowly   Complete by:  As directed      Allergies as of 07/19/2017      Reactions   Azithromycin Itching      Medication List    TAKE these medications   allopurinol 300 MG tablet Commonly known as:  ZYLOPRIM TAKE 1 TABLET BY MOUTH ONCE DAILY   digoxin 0.125 MG tablet Commonly known as:  LANOXIN Take 1 tablet (0.125 mg total) by mouth daily.   furosemide 40 MG tablet Commonly known as:  LASIX Take 1 tablet (40 mg total) by mouth daily.   metoprolol succinate 25 MG 24 hr tablet Commonly known as:  TOPROL XL Take 1 tablet (25 mg total) by  mouth daily.   warfarin 5 MG tablet Commonly known as:  COUMADIN Take as directed. If you are unsure how to take this medication, talk to your nurse or doctor. Original instructions:  TAKE 1 & 1/2 (ONE & ONE-HALF) TABLETS BY MOUTH ONCE DAILY OR AS DIRECTED BY COUMADIN CLINIC      Follow-up Information    Ellsworth Lennox, PA-C Follow up.   Specialties:  Physician Assistant, Cardiology Why:  Cardiology Hospital Follow-up on 08/02/2017 at 1:30PM.  Contact  information: [address] [place] [address] [phone]        Salley Scarlet, MD Follow up in 1 month(s).   Specialty:  Family Medicine Contact information: [address] [place] [address] [phone]        Jonelle Sidle, MD .   Specialty:  Cardiology Contact information: [address] [place] [address] [phone]          Allergies  Allergen Reactions  . Azithromycin Itching    Consultations:  None   Procedures/Studies: Dg Chest 2 View  Result Date: 07/18/2017 CLINICAL DATA:  Shortness of breath.  Lower extremity edema EXAM: CHEST - 2 VIEW COMPARISON:  October 01, 2016 FINDINGS: There is no edema or consolidation. Heart is mildly enlarged with pulmonary vascularity within normal limits. No adenopathy. No bone lesions. IMPRESSION: Stable cardiac prominence.  No edema or consolidation. Electronically Signed   By: Bretta Bang [name] M.D.   On: 07/18/2017 10:22     Discharge Exam: Vitals:   07/19/17 0838 07/19/17 1114  BP: (!) 129/102   Pulse: 91   Resp:    Temp:    SpO2: 100% 96%   Vitals:   07/18/17 2123 07/19/17 0553 07/19/17 0838 07/19/17 1114  BP:   (!) 129/102   Pulse: 61  91   Resp: 16     Temp:      TempSrc:      SpO2: 100%  100% 96%  Weight:  86.5 kg (190 lb 11.2 oz)    Height:        General: Pt is alert, awake, not in acute distress Cardiovascular: RRR, S1/S2 +, no rubs, no gallops Respiratory: CTA bilaterally, no wheezing, no rhonchi Abdominal: Soft, NT, ND, bowel sounds + Extremities: no edema, no cyanosis    The results of significant diagnostics from this hospitalization (including imaging, microbiology, ancillary and laboratory) are listed below for reference.     Microbiology: No results found for this or any previous visit (from the past 240 hour(s)).   Labs: BNP (last 3 results) Recent Labs    10/01/16 0813 07/18/17 1030  BNP 1,287.0* 1,352.0*   Basic Metabolic Panel: Recent Labs   Lab 07/18/17 1030 07/19/17 0419  NA 139 139  K 3.1* 3.3*  CL 106 104  CO2 22 24  GLUCOSE 143* 143*  BUN 32* 31*  CREATININE 1.41* 1.40*  CALCIUM 8.4* 8.1*  MG  --  1.3*   Liver Function Tests: Recent Labs  Lab 07/18/17 1030  AST 118*  ALT 213*  ALKPHOS 132*  BILITOT 1.2  PROT 6.4*  ALBUMIN 2.9*   No results for input(s): LIPASE, AMYLASE in the last 168 hours. No results for input(s): AMMONIA in the last 168 hours. CBC: Recent Labs  Lab 07/18/17 1030 07/19/17 0419  WBC 7.4 7.5  NEUTROABS 4.8  --   HGB 13.2 12.0*  HCT 40.9 38.6*  MCV 94.2 95.8  PLT 305 308   Cardiac  Enzymes: Recent Labs  Lab 07/18/17 1030  TROPONINI 0.04*   BNP: Invalid input(s): POCBNP CBG: No results for input(s): GLUCAP in the last 168 hours. D-Dimer No results for input(s): DDIMER in the last 72 hours. Hgb A1c No results for input(s): HGBA1C in the last 72 hours. Lipid Profile No results for input(s): CHOL, HDL, LDLCALC, TRIG, CHOLHDL, LDLDIRECT in the last 72 hours. Thyroid function studies No results for input(s): TSH, T4TOTAL, T3FREE, THYROIDAB in the last 72 hours.  Invalid input(s): FREET3 Anemia work up No results for input(s): VITAMINB12, FOLATE, FERRITIN, TIBC, IRON, RETICCTPCT in the last 72 hours. Urinalysis No results found for: COLORURINE, APPEARANCEUR, LABSPEC, PHURINE, GLUCOSEU, HGBUR, BILIRUBINUR, KETONESUR, PROTEINUR, UROBILINOGEN, NITRITE, LEUKOCYTESUR Sepsis Labs Invalid input(s): PROCALCITONIN,  WBC,  LACTICIDVEN Microbiology No results found for this or any previous visit (from the past 240 hour(s)).   Time coordinating discharge: 35 minutes  SIGNED:   Erick Blinks, DO [facility] 07/19/2017, 12:26 PM Pager [phone]  If 7PM-7AM, please contact night-coverage [email or web address] Password [email or web address]

## 2017-07-19 NOTE — Progress Notes (Signed)
Spoke CCMD pt had 5 beat run of Vtach pt is asymptomatic, MD aware, no new orders.  AKingBSNRN

## 2017-08-01 NOTE — Progress Notes (Signed)
Cardiology Office Note    Date:  08/02/2017   ID:  [name], DOB 10-23-1960, MRN [id number]  PCP:  Salley Scarlet, MD  Cardiologist: Nona Dell, MD    Chief Complaint  Patient presents with  . Hospitalization Follow-up    History of Present Illness:    David [name] is a 57 y.o. 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  He was last examined by myself in 05/2017 reporting overall doing well from a cardiac perspective at that time. Weight was stable at 180 lbs and he did not appear volume overloaded by physical examination. He was continued on his current medication regimen as BP did not allow for further titration of his Toprol-XL or the addition of ACE-I or ARB. He has since cancelled or no-showed for multiple follow-up visits.   In the interim, he presented to [facility] ED on 07/18/2017 for worsening dyspnea on exertion and lower extremity edema over the past several days. Reported missing several days of his medication prior to admission. BNP was found to be elevated at 1352 and CXR showed stable cardiac prominence with no edema or consolidation. He was given multiple doses of IV Lasix upon admission and was discharged the following day on PTA Lasix 40mg  daily.   In talking with the patient today, he reports that breathing has been at baseline but he has noticed worsening lower extremity edema since hospital discharge. He has not been following weights daily at home but weight was down to 190 lbs on 07/19/2017 and is now elevated to 203 lbs on the office scales today. He denies any specific orthopnea, PND, chest discomfort, or palpitations. He has noticed worsening fatigue and questions if this is due to his medication changes that  were made in the hospital.  He remains on Coumadin for anticoagulation and denies any evidence of active bleeding at this time.    Past Medical History:  Diagnosis Date  . Alcohol abuse   . Arthritis    Bilateral knees   . Atrial fibrillation (HCC)   . Chronic kidney disease, stage 2, mildly decreased GFR   . GERD (gastroesophageal reflux disease)   . Gout   . Nonischemic cardiomyopathy (HCC)    a. LVEF 20-25% in 2013, improved to 50% on medical therapy. b. 09/2016: echo showing reduced EF of 15-20% and cath showing normal cors.   Marland Kitchen TIA (transient ischemic attack)     Past Surgical History:  Procedure Laterality Date  . Arthroscopic knee surgery Right 1987  . CARDIOVERSION  10/08/2011   Procedure: CARDIOVERSION;  Surgeon: Jonelle Sidle, MD;  Location: [facility] ORS;  Service: Cardiovascular;  Laterality: N/A;  Done in PACU  . CARDIOVERSION  01/10/2012   Procedure: CARDIOVERSION;  Surgeon: Marinus Maw, MD;  Location: [facility] ENDOSCOPY;  Service: Cardiovascular;  Laterality: N/A;  . CARDIOVERSION N/A 11/17/2014   Procedure: CARDIOVERSION;  Surgeon: Laqueta Linden, MD;  Location: [facility] ORS;  Service: Cardiovascular;  Laterality: N/A;  . RIGHT/LEFT HEART CATH AND CORONARY ANGIOGRAPHY N/A 10/02/2016   Procedure: Right/Left Heart Cath and Coronary Angiography;  Surgeon: Lennette Bihari, MD;  Location: [facility] INVASIVE CV LAB;  Service: Cardiovascular;  Laterality: N/A;  . TEE WITHOUT CARDIOVERSION N/A 11/17/2014   Procedure: TRANSESOPHAGEAL ECHOCARDIOGRAM (TEE);  Surgeon: Laqueta Linden, MD;  Location: [facility] ORS;  Service: Cardiovascular;  Laterality: N/A;  . TEE WITHOUT CARDIOVERSION N/A 10/09/2016   Procedure: TRANSESOPHAGEAL ECHOCARDIOGRAM (TEE);  Surgeon: Elease Hashimoto Deloris Ping, MD;  Location: [facility] ENDOSCOPY;  Service: Cardiovascular;  Laterality: N/A;  . TOTAL KNEE ARTHROPLASTY Left 01/16/2013   Procedure: TOTAL KNEE ARTHROPLASTY;  Surgeon: Vickki Hearing, MD;  Location: [facility] ORS;  Service:  Orthopedics;  Laterality: Left;    Current Medications: Outpatient Medications Prior to Visit  Medication Sig Dispense Refill  . allopurinol (ZYLOPRIM) 300 MG tablet TAKE 1 TABLET BY MOUTH ONCE DAILY 30 tablet 5  . digoxin (LANOXIN) 0.125 MG tablet Take 1 tablet (0.125 mg total) by mouth daily. 90 tablet 3  . furosemide (LASIX) 40 MG tablet Take 1 tablet (40 mg total) by mouth daily. 90 tablet 3  . warfarin (COUMADIN) 5 MG tablet TAKE 1 & 1/2 (ONE & ONE-HALF) TABLETS BY MOUTH ONCE DAILY OR AS DIRECTED BY COUMADIN CLINIC 50 tablet 1  . metoprolol tartrate (LOPRESSOR) 25 MG tablet Take 1 tablet (25 mg total) by mouth 3 (three) times daily. 90 tablet 11   No facility-administered medications prior to visit.      Allergies:   Azithromycin   Social History   Socioeconomic History  . Marital status: Married    Spouse name: Not on file  . Number of children: Not on file  . Years of education: Not on file  . Highest education level: Not on file  Occupational History  . Not on file  Social Needs  . Financial resource strain: Not on file  . Food insecurity:    Worry: Not on file    Inability: Not on file  . Transportation needs:    Medical: Not on file    Non-medical: Not on file  Tobacco Use  . Smoking status: Never Smoker  . Smokeless tobacco: Former Neurosurgeon    Types: Snuff  . Tobacco comment: quit 1980's  Substance and Sexual Activity  . Alcohol use: Yes    Alcohol/week: 8.4 oz    Types: 14 Cans of beer per week    Comment: 1-2 beers daily   . Drug use: Not Currently    Types: Marijuana    Comment: last time 2 years ago  . Sexual activity: Yes    Partners: Male    Birth control/protection: None  Lifestyle  . Physical activity:    Days per week: Not on file    Minutes per session: Not on file  . Stress: Not on file  Relationships  . Social connections:    Talks on phone: Not on file    Gets together: Not on file    Attends religious service: Not on file    Active  member of club or organization: Not on file    Attends meetings of clubs or organizations: Not on file    Relationship status: Not on file  Other Topics Concern  . Not on file  Social History Narrative   Lives in [place] with his sister   Carmela Hurt off from [employer].     Family History:  The patient's family history includes Hypertension in his brother, mother, and sister.   Review of Systems:   Please see the history of present illness.     General:  No chills, fever, or night sweats. Positive for weight gain.  Cardiovascular:  No chest pain, dyspnea on exertion, orthopnea, palpitations, paroxysmal nocturnal dyspnea. Positive for edema.  Dermatological: No  rash, lesions/masses Respiratory: No cough, dyspnea Urologic: No hematuria, dysuria Abdominal:   No nausea, vomiting, diarrhea, bright red blood per rectum, melena, or hematemesis Neurologic:  No visual changes, wkns, changes in mental status. All other systems reviewed and are otherwise negative except as noted above.   Physical Exam:    VS:  BP 136/78   Pulse 88   Ht 5\' 11"  (1.803 m)   Wt 203 lb (92.1 kg)   SpO2 98%   BMI 28.31 kg/m    General: Well developed, well nourished [place] American male appearing in no acute distress. Head: Normocephalic, atraumatic, sclera non-icteric, no xanthomas, nares are without discharge.  Neck: No carotid bruits. JVD not elevated.  Lungs: Respirations regular and unlabored, without wheezes or rales.  Heart: Irregularly irregular. No S3 or S4.  No murmur, no rubs, or gallops appreciated. Abdomen: Soft, non-tender, non-distended with normoactive bowel sounds. No hepatomegaly. No rebound/guarding. No obvious abdominal masses. Msk:  Strength and tone appear normal for age. No joint deformities or effusions. Extremities: No clubbing or cyanosis. 2+ pitting edema up to mid-shins bilaterally. Distal pedal pulses are 2+ bilaterally. Neuro: Alert and oriented X 3. Moves all extremities  spontaneously. No focal deficits noted. Psych:  Responds to questions appropriately with a normal affect. Skin: No rashes or lesions noted  Wt Readings from Last 3 Encounters:  08/02/17 203 lb (92.1 kg)  07/19/17 190 lb 11.2 oz (86.5 kg)  05/17/17 180 lb 6.4 oz (81.8 kg)     Studies/Labs Reviewed:   EKG:  EKG is not ordered today.   Recent Labs: 07/18/2017: ALT 213; B Natriuretic Peptide 1,352.0 07/19/2017: BUN 31; Creatinine, Ser 1.40; Hemoglobin 12.0; Magnesium 1.3; Platelets 308; Potassium 3.3; Sodium 139   Lipid Panel    Component Value Date/Time   CHOL 104 10/03/2016 0744   TRIG 34 10/03/2016 0744   HDL 30 (L) 10/03/2016 0744   CHOLHDL 3.5 10/03/2016 0744   VLDL 7 10/03/2016 0744   LDLCALC 67 10/03/2016 0744    Additional studies/ records that were reviewed today include:   Echocardiogram: 09/2016 Study Conclusions  - Left ventricle: The cavity size was mildly dilated. Wall   thickness was normal. Systolic function was severely reduced. The   estimated ejection fraction was in the range of 15% to 20%.   Diffuse hypokinesis. The study is not technically sufficient to   allow evaluation of LV diastolic function. - Aortic valve: Mildly calcified annulus. Trileaflet; mildly   thickened leaflets. There was moderate regurgitation. Valve area   (VTI): 1.73 cm^2. Valve area (Vmax): 1.41 cm^2. - Mitral valve: Mildly calcified annulus. Normal thickness leaflets   . - Left atrium: The atrium was severely dilated. - Right ventricle: The cavity size was mildly dilated. Systolic   function was mildly reduced. - Right atrium: The atrium was severely dilated. - Tricuspid valve: There was moderate regurgitation. - Pulmonary arteries: Systolic pressure was mildly to moderately   increased. PA peak pressure: 39 mm Hg (S). - Technically adequate study.  Cardiac Catheterization: 09/2016 Echo documentation of ejection fraction of 15-20%.  Dilated left ventricle with an LVEDP of  27 mm.  Moderate right heart pressure elevation with moderate pulmonary hypertension.  Normal epicardial coronary arteries.  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  RECOMMENDATION: Aggressive medical therapy for a nonischemic dilated cardiomyopathy.  Consider ARB therapy with future transition to entresto, spironolactone,  carvedilol, and consideration for initial LifeVest with possible need for future prophylactic ICD implantation.  Assessment:  1. Chronic systolic (congestive) heart failure (HCC)   2. Nonischemic cardiomyopathy (HCC)   3. Persistent atrial fibrillation (HCC)   4. 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 Essential hypertension      Plan:   In order of problems listed above:  1. Chronic Systolic CHF/ Nonischemic Cardiomyopathy - the patient has a known reduced EF of 15-20% by echo in 09/2016 with cath showing normal cors. He was recently admitted for an acute CHF exacerbation in the setting of medication noncompliance. Diuresed well with IV Lasix and was discharged on Lasix 40 mg daily. - Reports breathing has been at baseline but he has experienced worsening lower extremity edema. He has 2+ pitting edema bilaterally at the time of today's examination but lungs are clear. I have asked him to take an additional Lasix tablet for the next 5 days then resume normal dosing. He is to report back if symptoms do not improve with this. Sodium and fluid restriction reviewed along with the importance of medication compliance. Will arrange close follow-up within 3-4 weeks.  - Was switched from Metoprolol Succinate to Metoprolol Tartrate during his recent admission. In the setting of his cardiomyopathy, guideline directed therapy would indicate that he be on Toprol-XL or Carvedilol. Will switch back to Toprol-XL and titrate dosing to 50 mg daily which should hopefully improve compliance as well. Consider addition  of ACE-I or ARB pending BP response.   2. Persistent Atrial Fibrillation/ Use of Long-Term Anticoagulation - Denies any recent palpitations but has experienced worsening fatigue since being switched from Toprol-XL to short-acting Lopressor. Will switch back to Toprol-XL but further titrate dosing from previous dose of 25mg  daily to 50mg  daily.  - Continue Coumadin for anticoagulation.  3. LAA - denies any evidence of active bleeding. Remains on Coumadin for anticoagulation. - He missed his last INR check due to being in the hospital. INR variable at 2.95 to 3.22 at that time. Will arrange for follow-up in the Coumadin Clinic.   4. HTN - BP is well-controlled at 136/78 during today's visit. - Will stop Metoprolol Tartrate as outlined above and switch to Metoprolol Succinate in the setting of his cardiomyopathy and noncompliance with TID dosing.   Medication Adjustments/Labs and Tests Ordered: Current medicines are reviewed at length with the patient today.  Concerns regarding medicines are outlined above.  Medication changes, Labs and Tests ordered today are listed in the Patient Instructions below. Patient Instructions  Medication Instructions:  STOP METOPROLOL TARTRATE.  START METOPROLOL SUCCINATE (Toprol-XL) 50mg  daily. May take two 25mg  tablets until you need a refill.   TAKE LASIX 40mg  TWICE DAILY for the next 5 days then resume 40mg  ONCE DAILY.    If you need a refill on your cardiac medications before your next appointment, please call your pharmacy.  Labwork: No scheduled labs today.   Testing/Procedures: None  Special Instructions: None  Follow-Up: Your physician wants you to follow-up in: 2 week for INR Check and 3-4 weeks with Dr. Diona Browner or Randall An, PA-C   Thank you for choosing [facility]!      Signed, Ellsworth Lennox, PA-C  08/02/2017 3:51 PM    [facility] [address] [place], [place] [address] Phone: [phone]

## 2017-08-02 ENCOUNTER — Ambulatory Visit (INDEPENDENT_AMBULATORY_CARE_PROVIDER_SITE_OTHER): Payer: Medicaid Other | Admitting: Student

## 2017-08-02 ENCOUNTER — Encounter: Payer: Self-pay | Admitting: Student

## 2017-08-02 VITALS — BP 136/78 | HR 88 | Ht 71.0 in | Wt 203.0 lb

## 2017-08-02 DIAGNOSIS — I428 Other cardiomyopathies: Secondary | ICD-10-CM | POA: Diagnosis not present

## 2017-08-02 DIAGNOSIS — I513 Intracardiac thrombosis, not elsewhere classified: Secondary | ICD-10-CM | POA: Diagnosis not present

## 2017-08-02 DIAGNOSIS — I4819 Other persistent atrial fibrillation: Secondary | ICD-10-CM

## 2017-08-02 DIAGNOSIS — I10 Essential (primary) hypertension: Secondary | ICD-10-CM

## 2017-08-02 DIAGNOSIS — I5022 Chronic systolic (congestive) heart failure: Secondary | ICD-10-CM

## 2017-08-02 DIAGNOSIS — I481 Persistent atrial fibrillation: Secondary | ICD-10-CM | POA: Diagnosis not present

## 2017-08-02 MED ORDER — METOPROLOL SUCCINATE ER 50 MG PO TB24: 50.0000 mg | ORAL_TABLET | Freq: Every day | ORAL | 3 refills | Status: DC

## 2017-08-02 NOTE — Patient Instructions (Signed)
Medication Instructions:  STOP METOPROLOL TARTRATE.  START METOPROLOL SUCCINATE (Toprol-XL) 50mg  daily. May take two 25mg  tablets until you need a refill.   TAKE LASIX 40mg  TWICE DAILY for the next 5 days then resume 40mg  ONCE DAILY.    If you need a refill on your cardiac medications before your next appointment, please call your pharmacy.  Labwork: No scheduled labs today.   Testing/Procedures: None  Special Instructions: None  Follow-Up: Your physician wants you to follow-up in: 2 week for INR Check and 3-4 weeks with Dr. Diona Browner or Randall An, PA-C   Thank you for choosing [facility]!           Low-Sodium Eating Plan Sodium, which is an element that makes up salt, helps you maintain a healthy balance of fluids in your body. Too much sodium can increase your blood pressure and cause fluid and waste to be held in your body. Your health care provider or dietitian may recommend following this plan if you have high blood pressure (hypertension), kidney disease, liver disease, or heart failure. Eating less sodium can help lower your blood pressure, reduce swelling, and protect your heart, liver, and kidneys. What are tips for following this plan? General guidelines  Most people on this plan should limit their sodium intake to 1,500-2,000 mg (milligrams) of sodium each day. Reading food labels  The Nutrition Facts label lists the amount of sodium in one serving of the food. If you eat more than one serving, you must multiply the listed amount of sodium by the number of servings.  Choose foods with less than 140 mg of sodium per serving.  Avoid foods with 300 mg of sodium or more per serving. Shopping  Look for lower-sodium products, often labeled as "low-sodium" or "no salt added."  Always check the sodium content even if foods are labeled as "unsalted" or "no salt added".  Buy fresh foods. ? Avoid canned foods and premade or frozen meals. ? Avoid canned,  cured, or processed meats  Buy breads that have less than 80 mg of sodium per slice. Cooking  Eat more home-cooked food and less restaurant, buffet, and fast food.  Avoid adding salt when cooking. Use salt-free seasonings or herbs instead of table salt or sea salt. Check with your health care provider or pharmacist before using salt substitutes.  Cook with plant-based oils, such as canola, sunflower, or olive oil. Meal planning  When eating at a restaurant, ask that your food be prepared with less salt or no salt, if possible.  Avoid foods that contain MSG (monosodium glutamate). MSG is sometimes added to [place] food, bouillon, and some canned foods. What foods are recommended? The items listed may not be a complete list. Talk with your dietitian about what dietary choices are best for you. Grains Low-sodium cereals, including oats, puffed wheat and rice, and shredded wheat. Low-sodium crackers. Unsalted rice. Unsalted pasta. Low-sodium bread. Whole-grain breads and whole-grain pasta. Vegetables Fresh or frozen vegetables. "No salt added" canned vegetables. "No salt added" tomato sauce and paste. Low-sodium or reduced-sodium tomato and vegetable juice. Fruits Fresh, frozen, or canned fruit. Fruit juice. Meats and other protein foods Fresh or frozen (no salt added) meat, poultry, seafood, and fish. Low-sodium canned tuna and salmon. Unsalted nuts. Dried peas, beans, and lentils without added salt. Unsalted canned beans. Eggs. Unsalted nut butters. Dairy Milk. Soy milk. Cheese that is naturally low in sodium, such as ricotta cheese, fresh mozzarella, or Swiss cheese Low-sodium or reduced-sodium cheese. Cream cheese.  Yogurt. Fats and oils Unsalted butter. Unsalted margarine with no trans fat. Vegetable oils such as canola or olive oils. Seasonings and other foods Fresh and dried herbs and spices. Salt-free seasonings. Low-sodium mustard and ketchup. Sodium-free salad dressing. Sodium-free  light mayonnaise. Fresh or refrigerated horseradish. Lemon juice. Vinegar. Homemade, reduced-sodium, or low-sodium soups. Unsalted popcorn and pretzels. Low-salt or salt-free chips. What foods are not recommended? The items listed may not be a complete list. Talk with your dietitian about what dietary choices are best for you. Grains Instant hot cereals. Bread stuffing, pancake, and biscuit mixes. Croutons. Seasoned rice or pasta mixes. Noodle soup cups. Boxed or frozen macaroni and cheese. Regular salted crackers. Self-rising flour. Vegetables Sauerkraut, pickled vegetables, and relishes. Olives. [place] fries. Onion rings. Regular canned vegetables (not low-sodium or reduced-sodium). Regular canned tomato sauce and paste (not low-sodium or reduced-sodium). Regular tomato and vegetable juice (not low-sodium or reduced-sodium). Frozen vegetables in sauces. Meats and other protein foods Meat or fish that is salted, canned, smoked, spiced, or pickled. Bacon, ham, sausage, hotdogs, corned beef, chipped beef, packaged lunch meats, salt pork, jerky, pickled herring, anchovies, regular canned tuna, sardines, salted nuts. Dairy Processed cheese and cheese spreads. Cheese curds. Blue cheese. Feta cheese. String cheese. Regular cottage cheese. Buttermilk. Canned milk. Fats and oils Salted butter. Regular margarine. Ghee. Bacon fat. Seasonings and other foods Onion salt, garlic salt, seasoned salt, table salt, and sea salt. Canned and packaged gravies. Worcestershire sauce. Tartar sauce. Barbecue sauce. Teriyaki sauce. Soy sauce, including reduced-sodium. Steak sauce. Fish sauce. Oyster sauce. Cocktail sauce. Horseradish that you find on the shelf. Regular ketchup and mustard. Meat flavorings and tenderizers. Bouillon cubes. Hot sauce and Tabasco sauce. Premade or packaged marinades. Premade or packaged taco seasonings. Relishes. Regular salad dressings. Salsa. Potato and tortilla chips. Corn chips and puffs.  Salted popcorn and pretzels. Canned or dried soups. Pizza. Frozen entrees and pot pies. Summary  Eating less sodium can help lower your blood pressure, reduce swelling, and protect your heart, liver, and kidneys.  Most people on this plan should limit their sodium intake to 1,500-2,000 mg (milligrams) of sodium each day.  Canned, boxed, and frozen foods are high in sodium. Restaurant foods, fast foods, and pizza are also very high in sodium. You also get sodium by adding salt to food.  Try to cook at home, eat more fresh fruits and vegetables, and eat less fast food, canned, processed, or prepared foods. This information is not intended to replace advice given to you by your health care provider. Make sure you discuss any questions you have with your health care provider. Document Released: 09/15/2001 Document Revised: 03/19/2016 Document Reviewed: 03/19/2016 Elsevier Interactive Patient Education  [employer].

## 2017-08-19 ENCOUNTER — Other Ambulatory Visit: Payer: Self-pay | Admitting: Cardiology

## 2017-08-20 ENCOUNTER — Encounter: Payer: Self-pay | Admitting: *Deleted

## 2017-08-28 ENCOUNTER — Ambulatory Visit (INDEPENDENT_AMBULATORY_CARE_PROVIDER_SITE_OTHER): Payer: Medicaid Other | Admitting: *Deleted

## 2017-08-28 DIAGNOSIS — I4891 Unspecified atrial fibrillation: Secondary | ICD-10-CM | POA: Diagnosis not present

## 2017-08-28 DIAGNOSIS — Z5181 Encounter for therapeutic drug level monitoring: Secondary | ICD-10-CM

## 2017-08-28 LAB — POCT INR: INR: 5.7 — AB (ref 2.0–3.0)

## 2017-08-28 NOTE — Patient Instructions (Signed)
Hold coumadin tomorrow and Friday then decrease dose to 1 tablet daily except 1 1/2 tablets on Tuesdays, Thursdays and Saturdays.  Recheck in 2 weeks. Took coumadin this morning

## 2017-09-03 NOTE — Progress Notes (Signed)
Cardiology Office Note    Date:  09/04/2017   ID:  CHEE David [name], DOB [birth year], MRN [id number]  PCP:  Salley Scarlet, MD  Cardiologist: Nona Dell, MD    Chief Complaint  Patient presents with  . Follow-up    4 week visit    History of Present Illness:    David [name] is a 57 y.o. 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   He was last examined by myself on 08/02/2017 following a recent hospitalization for a CHF exacerbation in the setting of medication noncompliance. At the time of his visit, he reported breathing was at baseline but had been experiencing worsening lower extremity edema. Weight had been 190 lbs at the time of hospital discharge and was increased to 203 lbs at his visit. Therefore, he was instructed to take an additional Lasix tablet for the next 5 days then resume his baseline dose of 40mg  daily. During his recent admission, he was switched from Toprol-XL to Metoprolol Tartrate. In the setting of his cardiomyopathy, this was switched back to Toprol-XL with consideration of adding an ACE-I or ARB at his next office visit if BP allows.   In talking with the patient today, he reports overall doing well since his last office visit. He does experience some lower extremity edema at the end of working his shift but says this improves by the following morning. He does not take his Lasix until after 1600 due to his work schedule. Weight is now below his previous baseline of 190 lbs as he is at 185 lbs. 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 Denies any recent dyspnea on exertion, orthopnea, PND, chest pain, or palpitations. He was experiencing fatigue when on short  acting Lopressor but reports this has significantly improved since being switched to Toprol-XL.  Continues to consume alcohol on the weekends but says he is trying to reduce his consumption.    Past Medical History:  Diagnosis Date  . Alcohol abuse   . Arthritis    Bilateral knees   . Atrial fibrillation (HCC)   . Chronic kidney disease, stage 2, mildly decreased GFR   . GERD (gastroesophageal reflux disease)   . Gout   . Nonischemic cardiomyopathy (HCC)    a. LVEF 20-25% in 2013, improved to 50% on medical therapy. b. 09/2016: echo showing reduced EF of 15-20% and cath showing normal cors.   Marland Kitchen TIA (transient ischemic attack)     Past Surgical History:  Procedure Laterality Date  . Arthroscopic knee surgery Right 1987  . CARDIOVERSION  10/08/2011   Procedure: CARDIOVERSION;  Surgeon: Jonelle Sidle, MD;  Location: [facility];  Service: Cardiovascular;  Laterality: N/A;  Done in PACU  . CARDIOVERSION  01/10/2012   Procedure: CARDIOVERSION;  Surgeon: Marinus Maw, MD;  Location: [facility] ENDOSCOPY;  Service: Cardiovascular;  Laterality: N/A;  . CARDIOVERSION N/A 11/17/2014   Procedure: CARDIOVERSION;  Surgeon: Laqueta Linden, MD;  Location: [facility] ORS;  Service: Cardiovascular;  Laterality: N/A;  . RIGHT/LEFT HEART CATH AND CORONARY ANGIOGRAPHY N/A 10/02/2016   Procedure: Right/Left Heart Cath and Coronary Angiography;  Surgeon: Lennette Bihari, MD;  Location: [facility] INVASIVE CV LAB;  Service: Cardiovascular;  Laterality: N/A;  .  TEE WITHOUT CARDIOVERSION N/A 11/17/2014   Procedure: TRANSESOPHAGEAL ECHOCARDIOGRAM (TEE);  Surgeon: Laqueta Linden, MD;  Location: [facility]S;  Service: Cardiovascular;  Laterality: N/A;  . TEE WITHOUT CARDIOVERSION N/A 10/09/2016   Procedure: TRANSESOPHAGEAL ECHOCARDIOGRAM (TEE);  Surgeon: Elease Hashimoto Deloris Ping, MD;  Location: [facility] ENDOSCOPY;  Service: Cardiovascular;  Laterality: N/A;  . TOTAL KNEE ARTHROPLASTY Left 01/16/2013   Procedure: TOTAL KNEE ARTHROPLASTY;  Surgeon:  Vickki Hearing, MD;  Location: [facility]S;  Service: Orthopedics;  Laterality: Left;    Current Medications: Outpatient Medications Prior to Visit  Medication Sig Dispense Refill  . allopurinol (ZYLOPRIM) 300 MG tablet TAKE 1 TABLET BY MOUTH ONCE DAILY 30 tablet 5  . digoxin (LANOXIN) 0.125 MG tablet Take 1 tablet (0.125 mg total) by mouth daily. 90 tablet 3  . furosemide (LASIX) 40 MG tablet Take 1 tablet (40 mg total) by mouth daily. 90 tablet 3  . warfarin (COUMADIN) 5 MG tablet TAKE 1 & 1/2 (ONE & ONE-HALF) TABLETS BY MOUTH ONCE DAILY OR  AS  DIRECTED  BY  COUMADIN  CLINIC 50 tablet 0  . metoprolol succinate (TOPROL-XL) 50 MG 24 hr tablet Take 1 tablet (50 mg total) by mouth daily. Take with or immediately following a meal. 90 tablet 3   No facility-administered medications prior to visit.      Allergies:   Azithromycin   Social History   Socioeconomic History  . Marital status: Married    Spouse name: Not on file  . Number of children: Not on file  . Years of education: Not on file  . Highest education level: Not on file  Occupational History  . Not on file  Social Needs  . Financial resource strain: Not on file  . Food insecurity:    Worry: Not on file    Inability: Not on file  . Transportation needs:    Medical: Not on file    Non-medical: Not on file  Tobacco Use  . Smoking status: Never Smoker  . Smokeless tobacco: Former Neurosurgeon    Types: Snuff  . Tobacco comment: quit 1980's  Substance and Sexual Activity  . Alcohol use: Yes    Alcohol/week: 8.4 oz    Types: 14 Cans of beer per week    Comment: 1-2 beers daily   . Drug use: Not Currently    Types: Marijuana    Comment: last time 2 years ago  . Sexual activity: Yes    Partners: Male    Birth control/protection: None  Lifestyle  . Physical activity:    Days per week: Not on file    Minutes per session: Not on file  . Stress: Not on file  Relationships  . Social connections:    Talks on phone: Not on  file    Gets together: Not on file    Attends religious service: Not on file    Active member of club or organization: Not on file    Attends meetings of clubs or organizations: Not on file    Relationship status: Not on file  Other Topics Concern  . Not on file  Social History Narrative   Lives in [place] with his sister   Carmela Hurt off from [employer].     Family History:  The patient's family history includes Hypertension in his brother, mother, and sister.   Review of Systems:   Please see the history of present illness.     General:  No chills, fever, night sweats or weight  changes. Positive for fatigue (improved).  Cardiovascular:  No chest pain, dyspnea on exertion, orthopnea, palpitations, paroxysmal nocturnal dyspnea. Positive for lower extremity edema.  Dermatological: No rash, lesions/masses Respiratory: No cough, dyspnea Urologic: No hematuria, dysuria Abdominal:   No nausea, vomiting, diarrhea, bright red blood per rectum, melena, or hematemesis Neurologic:  No visual changes, wkns, changes in mental status.  All other systems reviewed and are otherwise negative except as noted above.   Physical Exam:    VS:  BP 120/62   Pulse 90   Ht 5\' 11"  (1.803 m)   Wt 185 lb (83.9 kg)   SpO2 97%   BMI 25.80 kg/m    General: Well developed, well nourished [place] American male appearing in no acute distress. Head: Normocephalic, atraumatic, sclera non-icteric, no xanthomas, nares are without discharge.  Neck: No carotid bruits. JVD not elevated.  Lungs: Respirations regular and unlabored, without wheezes or rales.  Heart: Irregularly irregular. No S3 or S4.  No murmur, no rubs, or gallops appreciated. Abdomen: Soft, non-tender, non-distended with normoactive bowel sounds. No hepatomegaly. No rebound/guarding. No obvious abdominal masses. Msk:  Strength and tone appear normal for age. No joint deformities or effusions. Extremities: No clubbing or cyanosis. Trace lower  extremity edema.  Distal pedal pulses are 2+ bilaterally. Neuro: Alert and oriented X 3. Moves all extremities spontaneously. No focal deficits noted. Psych:  Responds to questions appropriately with a normal affect. Skin: No rashes or lesions noted  Wt Readings from Last 3 Encounters:  09/04/17 185 lb (83.9 kg)  08/02/17 203 lb (92.1 kg)  07/19/17 190 lb 11.2 oz (86.5 kg)     Studies/Labs Reviewed:   EKG:  EKG is not ordered today.   Recent Labs: 07/18/2017: ALT 213; B Natriuretic Peptide 1,352.0 07/19/2017: BUN 31; Creatinine, Ser 1.40; Hemoglobin 12.0; Magnesium 1.3; Platelets 308; Potassium 3.3; Sodium 139   Lipid Panel    Component Value Date/Time   CHOL 104 10/03/2016 0744   TRIG 34 10/03/2016 0744   HDL 30 (L) 10/03/2016 0744   CHOLHDL 3.5 10/03/2016 0744   VLDL 7 10/03/2016 0744   LDLCALC 67 10/03/2016 0744    Additional studies/ records that were reviewed today include:   Echocardiogram: 09/2016 Study Conclusions  - Left ventricle: The cavity size was mildly dilated. Wall   thickness was normal. Systolic function was severely reduced. The   estimated ejection fraction was in the range of 15% to 20%.   Diffuse hypokinesis. The study is not technically sufficient to   allow evaluation of LV diastolic function. - Aortic valve: Mildly calcified annulus. Trileaflet; mildly   thickened leaflets. There was moderate regurgitation. Valve area   (VTI): 1.73 cm^2. Valve area (Vmax): 1.41 cm^2. - Mitral valve: Mildly calcified annulus. Normal thickness leaflets   . - Left atrium: The atrium was severely dilated. - Right ventricle: The cavity size was mildly dilated. Systolic   function was mildly reduced. - Right atrium: The atrium was severely dilated. - Tricuspid valve: There was moderate regurgitation. - Pulmonary arteries: Systolic pressure was mildly to moderately   increased. PA peak pressure: 39 mm Hg (S). - Technically adequate study.  Cardiac  Catheterization: 09/2016 Echo documentation of ejection fraction of 15-20%.  Dilated left ventricle with an LVEDP of 27 mm.  Moderate right heart pressure elevation with moderate pulmonary hypertension.  Normal epicardial coronary arteries.  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  RECOMMENDATION: Aggressive medical therapy for a nonischemic dilated cardiomyopathy.  Consider ARB therapy  with future transition to entresto, spironolactone,  carvedilol, and consideration for initial LifeVest with possible need for future prophylactic ICD implantation.  Assessment:    1. Chronic systolic (congestive) heart failure (HCC)   2. Nonischemic cardiomyopathy (HCC)   3. Persistent atrial fibrillation (HCC)   4. 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 Essential hypertension   6. Medication management      Plan:   In order of problems listed above:  1. Chronic Systolic CHF/ Nonischemic Cardiomyopathy - the patient has a known reduced EF of 15-20% by echo in 09/2016 with cath showing normal cors.  Medical therapy has been limited in the setting of medication noncompliance but this has overall improved over the past several months as he has been compliant with his medication regimen and with office visits. He denies any recent dyspnea on exertion, orthopnea, or PND and only has trace lower extremity edema on examination.  - Will continue Toprol-XL 50 mg daily. He was previously on Lisinopril but this was discontinued during previous admissions secondary to hypotension. Will attempt to add back at this time but at a lower dose of 2.5mg  daily (previously on 5mg  daily). Will repeat BMET in 2 weeks to reassess kidney function and K+ levels.   2. Persistent Atrial Fibrillation/ Use of Long-Term Anticoagulation - He denies any recent palpitations. Continue Toprol-XL 50 mg daily for rate control. - Remains on Coumadin for  anticoagulation.  3. LAA - He remains on Coumadin for anticoagulation. Recent INR on 08/28/2017 was elevated to 5.7. He is scheduled for a repeat Coumadin visit next Monday. Was previously experiencing easy bruising but says this has improved.  4. HTN - BP is well controlled at 120/62 during today's visit. - Continue Toprol-XL with the addition of low-dose Lisinopril as outlined above.   Medication Adjustments/Labs and Tests Ordered: Current medicines are reviewed at length with the patient today.  Concerns regarding medicines are outlined above.  Medication changes, Labs and Tests ordered today are listed in the Patient Instructions below. Patient Instructions  Medication Instructions:  Your physician has recommended you make the following change in your medication: Start Lisinopril 2.5 mg Daily   Labwork: Your physician recommends that you return for lab work in: 2 Weeks   Testing/Procedures: NONE   Follow-Up: Your physician recommends that you schedule a follow-up appointment in: 2-3 Months   Any Other Special Instructions Will Be Listed Below (If Applicable).  If you need a refill on your cardiac medications before your next appointment, please call your pharmacy.  Thank you for choosing [facility]!    Signed, Ellsworth Lennox, PA-C  09/04/2017 5:06 PM    [facility] [address] [place], [place] [address] Phone: [phone]

## 2017-09-04 ENCOUNTER — Ambulatory Visit (INDEPENDENT_AMBULATORY_CARE_PROVIDER_SITE_OTHER): Payer: Medicaid Other | Admitting: Student

## 2017-09-04 ENCOUNTER — Encounter: Payer: Self-pay | Admitting: Student

## 2017-09-04 VITALS — BP 120/62 | HR 90 | Ht 71.0 in | Wt 185.0 lb

## 2017-09-04 DIAGNOSIS — I10 Essential (primary) hypertension: Secondary | ICD-10-CM

## 2017-09-04 DIAGNOSIS — I4819 Other persistent atrial fibrillation: Secondary | ICD-10-CM

## 2017-09-04 DIAGNOSIS — I513 Intracardiac thrombosis, not elsewhere classified: Secondary | ICD-10-CM

## 2017-09-04 DIAGNOSIS — I428 Other cardiomyopathies: Secondary | ICD-10-CM | POA: Diagnosis not present

## 2017-09-04 DIAGNOSIS — I481 Persistent atrial fibrillation: Secondary | ICD-10-CM

## 2017-09-04 DIAGNOSIS — Z79899 Other long term (current) drug therapy: Secondary | ICD-10-CM | POA: Diagnosis not present

## 2017-09-04 DIAGNOSIS — I5022 Chronic systolic (congestive) heart failure: Secondary | ICD-10-CM

## 2017-09-04 MED ORDER — METOPROLOL SUCCINATE ER 50 MG PO TB24: 50.0000 mg | ORAL_TABLET | Freq: Every day | ORAL | 3 refills | Status: DC

## 2017-09-04 MED ORDER — LISINOPRIL 2.5 MG PO TABS: 2.5000 mg | ORAL_TABLET | Freq: Every day | ORAL | 3 refills | Status: DC

## 2017-09-04 NOTE — Patient Instructions (Signed)
Medication Instructions:  Your physician has recommended you make the following change in your medication: Start Lisinopril 2.5 mg Daily    Labwork: Your physician recommends that you return for lab work in: 2 Weeks    Testing/Procedures: NONE   Follow-Up: Your physician recommends that you schedule a follow-up appointment in: 2-3 Months    Any Other Special Instructions Will Be Listed Below (If Applicable).     If you need a refill on your cardiac medications before your next appointment, please call your pharmacy.  Thank you for choosing [facility]!

## 2017-10-18 ENCOUNTER — Telehealth: Payer: Self-pay | Admitting: *Deleted

## 2017-10-18 ENCOUNTER — Other Ambulatory Visit: Payer: Self-pay | Admitting: Cardiology

## 2017-10-18 NOTE — Telephone Encounter (Signed)
Patient has requested a refill on his Warfarin. Pt has not been seen in the Anticoagulation Clinic since 08/28/17. Pt was due to come in on 09/09/17 to the  office for an INR recheck and was a NO SHOW. Tried to call the patient x 2 and there was no answer as the phone continued to ring and no voicemail was set up. Will try again, but at this time cannot send in a refill for his Warfarin.

## 2017-10-21 NOTE — Telephone Encounter (Signed)
Called pt to set up an appt since he is overdue. They phone message states restrictions on this line so the call can't go through. Called alternate mother which is listed as his mother's number and unable to leave a message as no voicemail is set up. Still unable to refill medication.

## 2017-10-22 ENCOUNTER — Emergency Department (HOSPITAL_COMMUNITY)
Admission: EM | Admit: 2017-10-22 | Discharge: 2017-10-22 | Disposition: A | Discharge status: Home / Self Care | Payer: Medicaid Other | Attending: Emergency Medicine | Admitting: Emergency Medicine

## 2017-10-22 ENCOUNTER — Other Ambulatory Visit: Payer: Self-pay

## 2017-10-22 ENCOUNTER — Encounter (HOSPITAL_COMMUNITY): Payer: Self-pay | Admitting: *Deleted

## 2017-10-22 DIAGNOSIS — N182 Chronic kidney disease, stage 2 (mild): Secondary | ICD-10-CM | POA: Insufficient documentation

## 2017-10-22 DIAGNOSIS — Z79899 Other long term (current) drug therapy: Secondary | ICD-10-CM | POA: Diagnosis not present

## 2017-10-22 DIAGNOSIS — T7840XA Allergy, unspecified, initial encounter: Secondary | ICD-10-CM | POA: Diagnosis not present

## 2017-10-22 DIAGNOSIS — I5042 Chronic combined systolic (congestive) and diastolic (congestive) heart failure: Secondary | ICD-10-CM | POA: Insufficient documentation

## 2017-10-22 DIAGNOSIS — R21 Rash and other nonspecific skin eruption: Secondary | ICD-10-CM | POA: Diagnosis present

## 2017-10-22 DIAGNOSIS — Z7901 Long term (current) use of anticoagulants: Secondary | ICD-10-CM | POA: Insufficient documentation

## 2017-10-22 MED ORDER — DIPHENHYDRAMINE HCL 50 MG/ML IJ SOLN
25.0000 mg | Freq: Once | INTRAMUSCULAR | Status: AC
  Administered 2017-10-22: 25 mg via INTRAVENOUS
  Filled 2017-10-22: qty 1

## 2017-10-22 MED ORDER — PREDNISONE 20 MG PO TABS: ORAL_TABLET | ORAL | 0 refills | Status: DC

## 2017-10-22 MED ORDER — FAMOTIDINE IN NACL 20-0.9 MG/50ML-% IV SOLN
20.0000 mg | Freq: Once | INTRAVENOUS | Status: AC
  Administered 2017-10-22: 20 mg via INTRAVENOUS
  Filled 2017-10-22: qty 50

## 2017-10-22 MED ORDER — METHYLPREDNISOLONE SODIUM SUCC 125 MG IJ SOLR
125.0000 mg | Freq: Once | INTRAMUSCULAR | Status: AC
  Administered 2017-10-22: 125 mg via INTRAVENOUS
  Filled 2017-10-22: qty 2

## 2017-10-22 MED ORDER — FAMOTIDINE 20 MG PO TABS: 20.0000 mg | ORAL_TABLET | Freq: Two times a day (BID) | ORAL | 0 refills | Status: AC

## 2017-10-22 NOTE — ED Provider Notes (Signed)
[facility] EMERGENCY DEPARTMENT Provider Note   CSN: [id number] Arrival date & time: 10/22/17  0107  Time seen 01:25 AM   History   Chief Complaint Chief Complaint  Patient presents with  . Rash    HPI David [name] is a 57 y.o. male.  HPI patient states he was sleeping and about 45 minutes prior to arrival he woke up and went to the bathroom and while in the bathroom he started itching all over.  He states he is never had anything like this happen before.  He denies swelling of his lips but he states he felt like his tongue was trying to swell.  He also states he has some mild difficulty swallowing and breathing.  He denies anything different in his routine today and his wife denies any change in her soaps or detergents or anything else in the household.  Patient states he ran out of his lisinopril and Coumadin a few days ago.  He states he is on the Coumadin for atrial fibrillation.  PCP Salley Scarlet, MD Cardiology Dr Diona Browner  Past Medical History:  Diagnosis Date  . Alcohol abuse   . Arthritis    Bilateral knees   . Atrial fibrillation (HCC)   . Chronic kidney disease, stage 2, mildly decreased GFR   . GERD (gastroesophageal reflux disease)   . Gout   . Nonischemic cardiomyopathy (HCC)    a. LVEF 20-25% in 2013, improved to 50% on medical therapy. b. 09/2016: echo showing reduced EF of 15-20% and cath showing normal cors.   Marland Kitchen TIA (transient ischemic attack)     Patient Active Problem List   Diagnosis Date Noted  . CHF (congestive heart failure) (HCC) 07/18/2017  . Normal coronary arteries 11/27/2016  . Demand ischemia (HCC)   . NSTEMI (non-ST elevated myocardial infarction) (HCC) 10/02/2016  . Acute on chronic systolic and diastolic heart failure, NYHA class 3 (HCC) 10/02/2016  . Dyspnea 10/01/2016  . Persistent atrial fibrillation (HCC)   . BPH (benign prostatic hypertrophy) 02/17/2014  . Head injury, unspecified 12/09/2013  . Alcohol abuse  12/09/2013  . Neck strain 12/09/2013  . Acid reflux 12/09/2013  . Encounter for therapeutic drug monitoring 05/28/2013  . S/P total knee replacement 04/07/2013  . Stiffness of left knee 02/05/2013  . Status post left knee replacement 01/29/2013  . OA (osteoarthritis) of knee 10/28/2012  . Routine general medical examination at a health care facility 09/25/2012  . Hyperlipidemia 09/25/2012  . Chronic systolic (congestive) heart failure (HCC) 01/21/2012  . Long term (current) use of anticoagulants 06/21/2011  . Atrial fibrillation (HCC) 06/15/2011  . NICM (nonischemic cardiomyopathy) (HCC) 06/15/2011    Past Surgical History:  Procedure Laterality Date  . Arthroscopic knee surgery Right 1987  . CARDIOVERSION  10/08/2011   Procedure: CARDIOVERSION;  Surgeon: Jonelle Sidle, MD;  Location: [facility]S;  Service: Cardiovascular;  Laterality: N/A;  Done in PACU  . CARDIOVERSION  01/10/2012   Procedure: CARDIOVERSION;  Surgeon: Marinus Maw, MD;  Location: [facility] ENDOSCOPY;  Service: Cardiovascular;  Laterality: N/A;  . CARDIOVERSION N/A 11/17/2014   Procedure: CARDIOVERSION;  Surgeon: Laqueta Linden, MD;  Location: [facility] ORS;  Service: Cardiovascular;  Laterality: N/A;  . RIGHT/LEFT HEART CATH AND CORONARY ANGIOGRAPHY N/A 10/02/2016   Procedure: Right/Left Heart Cath and Coronary Angiography;  Surgeon: Lennette Bihari, MD;  Location: [facility] INVASIVE CV LAB;  Service: Cardiovascular;  Laterality: N/A;  . TEE WITHOUT CARDIOVERSION N/A 11/17/2014   Procedure: TRANSESOPHAGEAL  ECHOCARDIOGRAM (TEE);  Surgeon: Laqueta Linden, MD;  Location: [facility] ORS;  Service: Cardiovascular;  Laterality: N/A;  . TEE WITHOUT CARDIOVERSION N/A 10/09/2016   Procedure: TRANSESOPHAGEAL ECHOCARDIOGRAM (TEE);  Surgeon: Elease Hashimoto Deloris Ping, MD;  Location: [facility] ENDOSCOPY;  Service: Cardiovascular;  Laterality: N/A;  . TOTAL KNEE ARTHROPLASTY Left 01/16/2013   Procedure: TOTAL KNEE ARTHROPLASTY;  Surgeon: Vickki Hearing, MD;   Location: [facility] ORS;  Service: Orthopedics;  Laterality: Left;        Home Medications    Prior to Admission medications   Medication Sig Start Date End Date Taking? Authorizing Provider  allopurinol (ZYLOPRIM) 300 MG tablet TAKE 1 TABLET BY MOUTH ONCE DAILY 05/28/17   Wendall Stade, MD  digoxin (LANOXIN) 0.125 MG tablet Take 1 tablet (0.125 mg total) by mouth daily. 07/19/17   Sherryll Burger, Pratik D, DO  famotidine (PEPCID) 20 MG tablet Take 1 tablet (20 mg total) by mouth 2 (two) times daily. 10/22/17   Devoria Albe, MD  furosemide (LASIX) 40 MG tablet Take 1 tablet (40 mg total) by mouth daily. 05/17/17   Strader, Lennart Pall, PA-C  lisinopril (PRINIVIL,ZESTRIL) 2.5 MG tablet Take 1 tablet (2.5 mg total) by mouth daily. 09/04/17 12/03/17  Strader, Lennart Pall, PA-C  metoprolol succinate (TOPROL-XL) 50 MG 24 hr tablet Take 1 tablet (50 mg total) by mouth daily. Take with or immediately following a meal. 09/04/17 08/30/18  Strader, Lennart Pall, PA-C  predniSONE (DELTASONE) 20 MG tablet Take 3 po QD x 3d , then 2 po QD x 3d then 1 po QD x 3d 10/22/17   Devoria Albe, MD  warfarin (COUMADIN) 5 MG tablet TAKE 1 & 1/2 (ONE & ONE-HALF) TABLETS BY MOUTH ONCE DAILY OR  AS  DIRECTED  BY  COUMADIN  CLINIC 08/20/17   Jonelle Sidle, MD    Family History Family History  Problem Relation Age of Onset  . Hypertension Mother   . Hypertension Sister   . Hypertension Brother     Social History Social History   Tobacco Use  . Smoking status: Never Smoker  . Smokeless tobacco: Former Neurosurgeon    Types: Snuff  . Tobacco comment: quit 1980's  Substance Use Topics  . Alcohol use: Yes    Alcohol/week: 8.4 oz    Types: 14 Cans of beer per week    Comment: 1-2 beers daily   . Drug use: Not Currently    Types: Marijuana    Comment: last time 2 years ago  lives with spouse   Allergies   Azithromycin   Review of Systems Review of Systems  All other systems reviewed and are negative.    Physical Exam Updated  Vital Signs BP 128/87 (BP Location: Right Arm)   Pulse (!) 108   Temp (!) 97.5 F (36.4 C) (Oral)   Resp 20   Ht 5\' 11"  (1.803 m)   Wt 82.6 kg (182 lb)   SpO2 94%   BMI 25.38 kg/m   Vital signs normal    Physical Exam  Constitutional: He is oriented to person, place, and time. He appears well-developed and well-nourished. He appears distressed.  Pt is scratching  HENT:  Head: Normocephalic and atraumatic.  Right Ear: External ear normal.  Left Ear: External ear normal.  Nose: Nose normal.  Mouth/Throat: Oropharynx is clear and moist.  Speech normal  Eyes: Pupils are equal, round, and reactive to light. Conjunctivae and EOM are normal.  Neck: Normal range of motion.  Cardiovascular: Normal rate.  An irregularly irregular rhythm present.  Pulmonary/Chest: Effort normal and breath sounds normal. No respiratory distress.  Musculoskeletal: Normal range of motion.  Neurological: He is alert and oriented to person, place, and time. No cranial nerve deficit.  Skin: Skin is warm and dry. Capillary refill takes less than 2 seconds.  Patient is noted to have small urticarial lesions on his arm, neck with some mild swelling of his ears.  Psychiatric: He has a normal mood and affect. His behavior is normal. Thought content normal.  Nursing note and vitals reviewed.    ED Treatments / Results  Labs (all labs ordered are listed, but only abnormal results are displayed) Labs Reviewed - No data to display  EKG None  Radiology No results found.  Procedures Procedures (including critical care time)  Medications Ordered in ED Medications  methylPREDNISolone sodium succinate (SOLU-MEDROL) 125 mg/2 mL injection 125 mg (125 mg Intravenous Given 10/22/17 0145)  famotidine (PEPCID) IVPB 20 mg premix (0 mg Intravenous Stopped 10/22/17 0216)  diphenhydrAMINE (BENADRYL) injection 25 mg (25 mg Intravenous Given 10/22/17 0147)  diphenhydrAMINE (BENADRYL) injection 25 mg (25 mg Intravenous  Given 10/22/17 0223)     Initial Impression / Assessment and Plan / ED Course  I have reviewed the triage vital signs and the nursing notes.  Pertinent labs & imaging results that were available during my care of the patient were reviewed by me and considered in my medical decision making (see chart for details).    Patient was given IV Solu-Medrol, Pepcid, Benadryl for his acute urticarial/allergic reaction.  Recheck at 3:45 AM patient's rash is gone.  He states he has minimal itching.  He feels ready to be discharged.  The etiology of his allergic reaction at this point is unknown.   Final Clinical Impressions(s) / ED Diagnoses   Final diagnoses:  Acute allergic reaction, initial encounter    ED Discharge Orders        Ordered    predniSONE (DELTASONE) 20 MG tablet     10/22/17 0422    famotidine (PEPCID) 20 MG tablet  2 times daily     10/22/17 0422    OTC claritin or zyrtec, benadryl  Plan discharge  Devoria Albe, MD, Concha Pyo, MD 10/22/17 [id number]

## 2017-10-22 NOTE — ED Triage Notes (Signed)
Pt c/o rash all over body that came up 30 mins pta; pt c/o itching

## 2017-10-22 NOTE — Discharge Instructions (Addendum)
Stay cool, heat will make the itching worse and the rash return. Take the medications until gone. Take zyrtec or claritin once a day. Take benadryl 25-50 mg every 6 hrs as needed for itching not controlled by the other medications. Return to the ED if you get worse.

## 2017-10-22 NOTE — ED Notes (Signed)
Pt states he feels better but still has some itching.

## 2017-11-20 NOTE — Progress Notes (Deleted)
Cardiology Office Note  Date: 11/20/2017   ID: David [name], DOB May 10, 1960, MRN [id number]  PCP: David Scarlet, MD  Primary Cardiologist: David Dell, MD   No chief complaint on file.   History of Present Illness: David [name] is a 57 y.o. male last seen in May by Ms. Strader PA-C.  At the previous visit lisinopril was started at 2.5 mg daily.  He continues on Coumadin for stroke prophylaxis, follows in the anticoagulation clinic.  Past Medical History:  Diagnosis Date  . Alcohol abuse   . Arthritis    Bilateral knees   . Atrial fibrillation (HCC)   . Chronic kidney disease, stage 2, mildly decreased GFR   . GERD (gastroesophageal reflux disease)   . Gout   . Nonischemic cardiomyopathy (HCC)    a. LVEF 20-25% in 2013, improved to 50% on medical therapy. b. 09/2016: echo showing reduced EF of 15-20% and cath showing normal cors.   Marland Kitchen TIA (transient ischemic attack)     Past Surgical History:  Procedure Laterality Date  . Arthroscopic knee surgery Right 1987  . CARDIOVERSION  10/08/2011   Procedure: CARDIOVERSION;  Surgeon: David Sidle, MD;  Location: [facility];  Service: Cardiovascular;  Laterality: N/A;  Done in PACU  . CARDIOVERSION  01/10/2012   Procedure: CARDIOVERSION;  Surgeon: David Maw, MD;  Location: [facility] ENDOSCOPY;  Service: Cardiovascular;  Laterality: N/A;  . CARDIOVERSION N/A 11/17/2014   Procedure: CARDIOVERSION;  Surgeon: David Linden, MD;  Location: [facility];  Service: Cardiovascular;  Laterality: N/A;  . RIGHT/LEFT HEART CATH AND CORONARY ANGIOGRAPHY N/A 10/02/2016   Procedure: Right/Left Heart Cath and Coronary Angiography;  Surgeon: David Bihari, MD;  Location: [facility] INVASIVE CV LAB;  Service: Cardiovascular;  Laterality: N/A;  . TEE WITHOUT CARDIOVERSION N/A 11/17/2014   Procedure: TRANSESOPHAGEAL ECHOCARDIOGRAM (TEE);  Surgeon: David Linden, MD;  Location: [facility];  Service: Cardiovascular;  Laterality: N/A;  .  TEE WITHOUT CARDIOVERSION N/A 10/09/2016   Procedure: TRANSESOPHAGEAL ECHOCARDIOGRAM (TEE);  Surgeon: David Hashimoto Deloris Ping, MD;  Location: [facility] ENDOSCOPY;  Service: Cardiovascular;  Laterality: N/A;  . TOTAL KNEE ARTHROPLASTY Left 01/16/2013   Procedure: TOTAL KNEE ARTHROPLASTY;  Surgeon: David Hearing, MD;  Location: [facility];  Service: Orthopedics;  Laterality: Left;    Current Outpatient Medications  Medication Sig Dispense Refill  . allopurinol (ZYLOPRIM) 300 MG tablet TAKE 1 TABLET BY MOUTH ONCE DAILY 30 tablet 5  . digoxin (LANOXIN) 0.125 MG tablet Take 1 tablet (0.125 mg total) by mouth daily. 90 tablet 3  . famotidine (PEPCID) 20 MG tablet Take 1 tablet (20 mg total) by mouth 2 (two) times daily. 18 tablet 0  . furosemide (LASIX) 40 MG tablet Take 1 tablet (40 mg total) by mouth daily. 90 tablet 3  . lisinopril (PRINIVIL,ZESTRIL) 2.5 MG tablet Take 1 tablet (2.5 mg total) by mouth daily. 90 tablet 3  . metoprolol succinate (TOPROL-XL) 50 MG 24 hr tablet Take 1 tablet (50 mg total) by mouth daily. Take with or immediately following a meal. 90 tablet 3  . predniSONE (DELTASONE) 20 MG tablet Take 3 po QD x 3d , then 2 po QD x 3d then 1 po QD x 3d 18 tablet 0  . warfarin (COUMADIN) 5 MG tablet TAKE 1 & 1/2 (ONE & ONE-HALF) TABLETS BY MOUTH ONCE DAILY OR  AS  DIRECTED  BY  COUMADIN  CLINIC 50 tablet 0   No current facility-administered medications  for this visit.    Allergies:  Azithromycin   Social History: The patient  reports that he has never smoked. He has quit using smokeless tobacco.  His smokeless tobacco use included snuff. He reports that he drinks about 14.0 standard drinks of alcohol per week. He reports that he has current or past drug history. Drug: Marijuana.   Family History: The patient's family history includes Hypertension in his brother, mother, and sister.   ROS:  Please see the history of present illness. Otherwise, complete review of systems is positive for {NONE  DEFAULTED:18576::"none"}.  All other systems are reviewed and negative.   Physical Exam: VS:  There were no vitals taken for this visit., BMI There is no height or weight on file to calculate BMI.  Wt Readings from Last 3 Encounters:  10/22/17 182 lb (82.6 kg)  09/04/17 185 lb (83.9 kg)  08/02/17 203 lb (92.1 kg)    General: Patient appears comfortable at rest. HEENT: Conjunctiva and lids normal, oropharynx clear with moist mucosa. Neck: Supple, no elevated JVP or carotid bruits, no thyromegaly. Lungs: Clear to auscultation, nonlabored breathing at rest. Cardiac: Regular rate and rhythm, no S3 or significant systolic murmur, no pericardial rub. Abdomen: Soft, nontender, no hepatomegaly, bowel sounds present, no guarding or rebound. Extremities: No pitting edema, distal pulses 2+. Skin: Warm and dry. Musculoskeletal: No kyphosis. Neuropsychiatric: Alert and oriented x3, affect grossly appropriate.  ECG: I personally reviewed the tracing from  Recent Labwork: 07/18/2017: ALT 213; AST 118; B Natriuretic Peptide 1,352.0 07/19/2017: BUN 31; Creatinine, Ser 1.40; Hemoglobin 12.0; Magnesium 1.3; Platelets 308; Potassium 3.3; Sodium 139     Component Value Date/Time   CHOL 104 10/03/2016 0744   TRIG 34 10/03/2016 0744   HDL 30 (L) 10/03/2016 0744   CHOLHDL 3.5 10/03/2016 0744   VLDL 7 10/03/2016 0744   LDLCALC 67 10/03/2016 0744    Other Studies Reviewed Today:  TEE 10/09/2016: Study Conclusions  - Left ventricle: Systolic function was severely reduced. The estimated ejection fraction was in the range of 20% to 25%. - Aortic valve: Bicuspid. - Mitral valve: There was mild regurgitation. - Left atrium: There was a medium-sizedthrombus. There was spontaneous echo contrast (&quot;smoke&quot;). - Tricuspid valve: There was moderate regurgitation.  Impressions:  - There is evidence of thrombus in the left atrial appendage.  Cardioversion was not performed.  Cardiac  catheterization 10/02/2016: Echo documentation of ejection fraction of 15-20%.  Dilated left ventricle with an LVEDP of 27 mm.  Moderate right heart pressure elevation with moderate pulmonary hypertension.  Normal epicardial coronary arteries.  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  Assessment and Plan:   Current medicines were reviewed with the patient today.  No orders of the defined types were placed in this encounter.   Disposition:  Signed, David Sidle, MD, [facility] 11/20/2017 3:40 PM    [facility] [address], [place], [place] [address] Phone: [phone]; Fax: [phone]

## 2017-11-21 ENCOUNTER — Ambulatory Visit: Payer: Medicaid Other | Admitting: Cardiology

## 2017-11-22 ENCOUNTER — Encounter: Payer: Self-pay | Admitting: Cardiology

## 2018-01-31 ENCOUNTER — Other Ambulatory Visit: Payer: Self-pay | Admitting: Cardiovascular Disease

## 2018-02-03 ENCOUNTER — Ambulatory Visit (INDEPENDENT_AMBULATORY_CARE_PROVIDER_SITE_OTHER): Payer: Medicaid Other | Admitting: *Deleted

## 2018-02-03 DIAGNOSIS — I4819 Other persistent atrial fibrillation: Secondary | ICD-10-CM | POA: Diagnosis not present

## 2018-02-03 DIAGNOSIS — Z5181 Encounter for therapeutic drug level monitoring: Secondary | ICD-10-CM

## 2018-02-03 DIAGNOSIS — I513 Intracardiac thrombosis, not elsewhere classified: Secondary | ICD-10-CM

## 2018-02-03 LAB — POCT INR: INR: 1.3 — AB (ref 2.0–3.0)

## 2018-02-03 MED ORDER — WARFARIN SODIUM 5 MG PO TABS: ORAL_TABLET | ORAL | 1 refills | Status: DC

## 2018-02-03 NOTE — Patient Instructions (Signed)
Restart coumadin 1 tablet daily except 1 1/2 tablets on Tuesdays, Thursdays and Saturdays.  Recheck in 2 weeks.

## 2018-02-24 ENCOUNTER — Ambulatory Visit (INDEPENDENT_AMBULATORY_CARE_PROVIDER_SITE_OTHER): Payer: Medicaid Other | Admitting: *Deleted

## 2018-02-24 DIAGNOSIS — I513 Intracardiac thrombosis, not elsewhere classified: Secondary | ICD-10-CM | POA: Diagnosis not present

## 2018-02-24 DIAGNOSIS — Z5181 Encounter for therapeutic drug level monitoring: Secondary | ICD-10-CM | POA: Diagnosis not present

## 2018-02-24 DIAGNOSIS — I4819 Other persistent atrial fibrillation: Secondary | ICD-10-CM

## 2018-02-24 LAB — POCT INR: INR: 3.9 — AB (ref 2.0–3.0)

## 2018-02-24 NOTE — Patient Instructions (Addendum)
Took coumadin this morning.  Hold coumadin tomorrow then decrease dose to 1 tablet daily except 1 1/2 tablets on Wednesdays and Saturdays.  Recheck in 2 weeks.

## 2018-03-03 ENCOUNTER — Emergency Department (HOSPITAL_COMMUNITY): Payer: Medicaid Other

## 2018-03-03 ENCOUNTER — Encounter (HOSPITAL_COMMUNITY): Payer: Self-pay | Admitting: Emergency Medicine

## 2018-03-03 ENCOUNTER — Other Ambulatory Visit: Payer: Self-pay

## 2018-03-03 ENCOUNTER — Emergency Department (HOSPITAL_COMMUNITY)
Admission: EM | Admit: 2018-03-03 | Discharge: 2018-03-03 | Disposition: A | Discharge status: Home / Self Care | Payer: Medicaid Other | Attending: Emergency Medicine | Admitting: Emergency Medicine

## 2018-03-03 DIAGNOSIS — Z7901 Long term (current) use of anticoagulants: Secondary | ICD-10-CM | POA: Diagnosis not present

## 2018-03-03 DIAGNOSIS — Z79899 Other long term (current) drug therapy: Secondary | ICD-10-CM | POA: Diagnosis not present

## 2018-03-03 DIAGNOSIS — E785 Hyperlipidemia, unspecified: Secondary | ICD-10-CM | POA: Insufficient documentation

## 2018-03-03 DIAGNOSIS — R05 Cough: Secondary | ICD-10-CM

## 2018-03-03 DIAGNOSIS — I4819 Other persistent atrial fibrillation: Secondary | ICD-10-CM | POA: Insufficient documentation

## 2018-03-03 DIAGNOSIS — I428 Other cardiomyopathies: Secondary | ICD-10-CM | POA: Insufficient documentation

## 2018-03-03 DIAGNOSIS — N182 Chronic kidney disease, stage 2 (mild): Secondary | ICD-10-CM | POA: Insufficient documentation

## 2018-03-03 DIAGNOSIS — R059 Cough, unspecified: Secondary | ICD-10-CM

## 2018-03-03 DIAGNOSIS — I252 Old myocardial infarction: Secondary | ICD-10-CM | POA: Insufficient documentation

## 2018-03-03 DIAGNOSIS — I5023 Acute on chronic systolic (congestive) heart failure: Secondary | ICD-10-CM | POA: Diagnosis not present

## 2018-03-03 LAB — CBC WITH DIFFERENTIAL/PLATELET
Abs Immature Granulocytes: 0.02 10*3/uL (ref 0.00–0.07)
Basophils Absolute: 0 10*3/uL (ref 0.0–0.1)
Basophils Relative: 1 %
Eosinophils Absolute: 0.2 10*3/uL (ref 0.0–0.5)
Eosinophils Relative: 3 %
HCT: 38.4 % — ABNORMAL LOW (ref 39.0–52.0)
Hemoglobin: 11.8 g/dL — ABNORMAL LOW (ref 13.0–17.0)
Immature Granulocytes: 0 %
Lymphocytes Relative: 23 %
Lymphs Abs: 1.4 10*3/uL (ref 0.7–4.0)
MCH: 28.8 pg (ref 26.0–34.0)
MCHC: 30.7 g/dL (ref 30.0–36.0)
MCV: 93.7 fL (ref 80.0–100.0)
Monocytes Absolute: 0.7 10*3/uL (ref 0.1–1.0)
Monocytes Relative: 12 %
Neutro Abs: 3.8 10*3/uL (ref 1.7–7.7)
Neutrophils Relative %: 61 %
Platelets: 271 10*3/uL (ref 150–400)
RBC: 4.1 MIL/uL — ABNORMAL LOW (ref 4.22–5.81)
RDW: 18.5 % — ABNORMAL HIGH (ref 11.5–15.5)
WBC: 6.2 10*3/uL (ref 4.0–10.5)
nRBC: 0 % (ref 0.0–0.2)

## 2018-03-03 LAB — BASIC METABOLIC PANEL
Anion gap: 7 (ref 5–15)
BUN: 21 mg/dL — ABNORMAL HIGH (ref 6–20)
CO2: 23 mmol/L (ref 22–32)
Calcium: 8.3 mg/dL — ABNORMAL LOW (ref 8.9–10.3)
Chloride: 107 mmol/L (ref 98–111)
Creatinine, Ser: 1.26 mg/dL — ABNORMAL HIGH (ref 0.61–1.24)
GFR calc Af Amer: >60 mL/min (ref >60)
GFR calc non Af Amer: >60 mL/min (ref >60)
Glucose, Bld: 98 mg/dL (ref 70–99)
Potassium: 3.8 mmol/L (ref 3.5–5.1)
Sodium: 137 mmol/L (ref 135–145)

## 2018-03-03 LAB — BRAIN NATRIURETIC PEPTIDE: B Natriuretic Peptide: 2155 pg/mL — ABNORMAL HIGH (ref 0.0–100.0)

## 2018-03-03 LAB — TROPONIN I
Troponin I: 0.04 ng/mL (ref <0.03)
Troponin I: 0.04 ng/mL (ref <0.03)

## 2018-03-03 MED ORDER — IPRATROPIUM-ALBUTEROL 0.5-2.5 (3) MG/3ML IN SOLN
3.0000 mL | Freq: Once | RESPIRATORY_TRACT | Status: AC
  Administered 2018-03-03: 3 mL via RESPIRATORY_TRACT
  Filled 2018-03-03: qty 3

## 2018-03-03 MED ORDER — FUROSEMIDE 10 MG/ML IJ SOLN
80.0000 mg | Freq: Once | INTRAMUSCULAR | Status: AC
  Administered 2018-03-03: 80 mg via INTRAVENOUS
  Filled 2018-03-03: qty 8

## 2018-03-03 MED ORDER — POTASSIUM CHLORIDE CRYS ER 20 MEQ PO TBCR
20.0000 meq | EXTENDED_RELEASE_TABLET | Freq: Once | ORAL | Status: AC
  Administered 2018-03-03: 20 meq via ORAL
  Filled 2018-03-03: qty 1

## 2018-03-03 MED ORDER — POTASSIUM CHLORIDE CRYS ER 20 MEQ PO TBCR: 20.0000 meq | EXTENDED_RELEASE_TABLET | Freq: Two times a day (BID) | ORAL | 0 refills | Status: DC

## 2018-03-03 NOTE — Discharge Instructions (Signed)
As discussed, it is important that you take your Lasix daily.  Call Dr. Ival Bible office to arrange a follow-up appointment.  Be sure to return to the ER for any worsening symptoms such as increasing shortness of breath or chest pain.

## 2018-03-03 NOTE — ED Notes (Signed)
Date and time results received: 03/03/18 3:45 PM  (use smartphrase ".now" to insert current time)  Test: Troponin Critical Value: 0.04  Name of Provider Notified: [place]  Orders Received? Or Actions Taken?: Orders Received - See Orders for details

## 2018-03-03 NOTE — ED Provider Notes (Addendum)
[facility] EMERGENCY DEPARTMENT Provider Note   CSN: [id number] Arrival date & time: 03/03/18  7829     History   Chief Complaint Chief Complaint  Patient presents with  . Cough    HPI David [name] is a 57 y.o. male.  HPI   David [name] is a 57 y.o. male with hx of atrial fib, chronic systolic heart failure and nonischemic cardiomyopathy, presents to the Emergency Department complaining of persistent cough for 1 month.  He describes the cough as episodic and productive at times of clear looking sputum.  He does admit to several episodes of posttussive emesis as well.  Cough is usually worse while lying down.  He endorses some shortness of breath with exertion and with coughing.  No chest pain.  No edema of the lower extremities.  He denies fever, chills, abdominal pain, and chest pain.  No recent sick contacts.  Takes lasix daily and denies missing any doses recently, but also states that he did not take his medications today    Past Medical History:  Diagnosis Date  . Alcohol abuse   . Arthritis    Bilateral knees   . Atrial fibrillation (HCC)   . Chronic kidney disease, stage 2, mildly decreased GFR   . GERD (gastroesophageal reflux disease)   . Gout   . Nonischemic cardiomyopathy (HCC)    a. LVEF 20-25% in 2013, improved to 50% on medical therapy. b. 09/2016: echo showing reduced EF of 15-20% and cath showing normal cors.   Marland Kitchen TIA (transient ischemic attack)     Patient Active Problem List   Diagnosis Date Noted  . CHF (congestive heart failure) (HCC) 07/18/2017  . Normal coronary arteries 11/27/2016  . Demand ischemia (HCC)   . NSTEMI (non-ST elevated myocardial infarction) (HCC) 10/02/2016  . Acute on chronic systolic and diastolic heart failure, NYHA class 3 (HCC) 10/02/2016  . Dyspnea 10/01/2016  . Persistent atrial fibrillation   . BPH (benign prostatic hypertrophy) 02/17/2014  . Head injury, unspecified 12/09/2013  . Alcohol abuse  12/09/2013  . Neck strain 12/09/2013  . Acid reflux 12/09/2013  . Encounter for therapeutic drug monitoring 05/28/2013  . S/P total knee replacement 04/07/2013  . Stiffness of left knee 02/05/2013  . Status post left knee replacement 01/29/2013  . OA (osteoarthritis) of knee 10/28/2012  . Routine general medical examination at a health care facility 09/25/2012  . Hyperlipidemia 09/25/2012  . Chronic systolic (congestive) heart failure (HCC) 01/21/2012  . Long term (current) use of anticoagulants 06/21/2011  . Atrial fibrillation (HCC) 06/15/2011  . NICM (nonischemic cardiomyopathy) (HCC) 06/15/2011    Past Surgical History:  Procedure Laterality Date  . Arthroscopic knee surgery Right 1987  . CARDIOVERSION  10/08/2011   Procedure: CARDIOVERSION;  Surgeon: Jonelle Sidle, MD;  Location: [facility] ORS;  Service: Cardiovascular;  Laterality: N/A;  Done in PACU  . CARDIOVERSION  01/10/2012   Procedure: CARDIOVERSION;  Surgeon: Marinus Maw, MD;  Location: [facility] ENDOSCOPY;  Service: Cardiovascular;  Laterality: N/A;  . CARDIOVERSION N/A 11/17/2014   Procedure: CARDIOVERSION;  Surgeon: Laqueta Linden, MD;  Location: [facility] ORS;  Service: Cardiovascular;  Laterality: N/A;  . RIGHT/LEFT HEART CATH AND CORONARY ANGIOGRAPHY N/A 10/02/2016   Procedure: Right/Left Heart Cath and Coronary Angiography;  Surgeon: Lennette Bihari, MD;  Location: [facility] INVASIVE CV LAB;  Service: Cardiovascular;  Laterality: N/A;  . TEE WITHOUT CARDIOVERSION N/A 11/17/2014   Procedure: TRANSESOPHAGEAL ECHOCARDIOGRAM (TEE);  Surgeon: Laqueta Linden,  MD;  Location: [facility] ORS;  Service: Cardiovascular;  Laterality: N/A;  . TEE WITHOUT CARDIOVERSION N/A 10/09/2016   Procedure: TRANSESOPHAGEAL ECHOCARDIOGRAM (TEE);  Surgeon: Elease Hashimoto Deloris Ping, MD;  Location: [facility] ENDOSCOPY;  Service: Cardiovascular;  Laterality: N/A;  . TOTAL KNEE ARTHROPLASTY Left 01/16/2013   Procedure: TOTAL KNEE ARTHROPLASTY;  Surgeon: Vickki Hearing, MD;   Location: [facility] ORS;  Service: Orthopedics;  Laterality: Left;        Home Medications    Prior to Admission medications   Medication Sig Start Date End Date Taking? Authorizing Provider  allopurinol (ZYLOPRIM) 300 MG tablet TAKE 1 TABLET BY MOUTH ONCE DAILY 02/03/18   Wendall Stade, MD  digoxin (LANOXIN) 0.125 MG tablet Take 1 tablet (0.125 mg total) by mouth daily. 07/19/17   Sherryll Burger, Pratik D, DO  famotidine (PEPCID) 20 MG tablet Take 1 tablet (20 mg total) by mouth 2 (two) times daily. 10/22/17   Devoria Albe, MD  furosemide (LASIX) 40 MG tablet Take 1 tablet (40 mg total) by mouth daily. 05/17/17   Strader, Lennart Pall, PA-C  lisinopril (PRINIVIL,ZESTRIL) 2.5 MG tablet Take 1 tablet (2.5 mg total) by mouth daily. 09/04/17 12/03/17  Strader, Lennart Pall, PA-C  metoprolol succinate (TOPROL-XL) 50 MG 24 hr tablet Take 1 tablet (50 mg total) by mouth daily. Take with or immediately following a meal. 09/04/17 08/30/18  Strader, Lennart Pall, PA-C  warfarin (COUMADIN) 5 MG tablet Take 1 tablet daily except 1 1/2 tablets on Tuesdays, Thursdays and Saturdays or as directed 02/03/18   Jonelle Sidle, MD    Family History Family History  Problem Relation Age of Onset  . Hypertension Mother   . Hypertension Sister   . Hypertension Brother     Social History Social History   Tobacco Use  . Smoking status: Never Smoker  . Smokeless tobacco: Former Neurosurgeon    Types: Snuff  . Tobacco comment: quit 1980's  Substance Use Topics  . Alcohol use: Yes    Alcohol/week: 14.0 standard drinks    Types: 14 Cans of beer per week    Comment: 1-2 beers daily   . Drug use: Not Currently    Types: Marijuana    Comment: last time 2 years ago     Allergies   Azithromycin   Review of Systems Review of Systems  Constitutional: Negative for chills and fever.  HENT: Negative for sore throat and trouble swallowing.   Respiratory: Positive for cough and shortness of breath. Negative for wheezing.     Cardiovascular: Negative for chest pain and leg swelling.  Gastrointestinal: Negative for abdominal pain, nausea and vomiting.  Musculoskeletal: Negative for arthralgias and joint swelling.  Skin: Negative for color change and wound.  Neurological: Negative for weakness, numbness and headaches.     Physical Exam Updated Vital Signs BP (!) 133/101 (BP Location: Left Arm)   Pulse 68   Temp (!) 97.4 F (36.3 C) (Oral)   Resp 16   Ht 5\' 11"  (1.803 m)   Wt 85.3 kg   SpO2 100%   BMI 26.22 kg/m   Physical Exam  Constitutional: He appears well-developed and well-nourished. No distress.  HENT:  Head: Normocephalic and atraumatic.  Right Ear: Tympanic membrane and ear canal normal.  Left Ear: Tympanic membrane and ear canal normal.  Mouth/Throat: Uvula is midline, oropharynx is clear and moist and mucous membranes are normal. No oropharyngeal exudate.  Eyes: Pupils are equal, round, and reactive to light. EOM are normal.  Neck: Normal  range of motion, full passive range of motion without pain and phonation normal. Neck supple.  Cardiovascular: Normal rate, regular rhythm and intact distal pulses.  No murmur heard. 1+ pitting edema of the right LE.  No edema or tenderness of the posterior lower legs.    Pulmonary/Chest: Effort normal. No stridor. No respiratory distress. He has no rales. He exhibits no tenderness.  Coarse lungs sounds bilaterally.  No definite crackles.    Abdominal: Soft. He exhibits no distension. There is no tenderness.  Musculoskeletal: He exhibits edema.  1+edema of the LE's, non-tender  Lymphadenopathy:    He has no cervical adenopathy.  Neurological: He is alert. He has normal strength. No sensory deficit. GCS eye subscore is 4. GCS verbal subscore is 5. GCS motor subscore is 6.  CN II-XII grossly intact.  Speech clear.  Skin: Skin is warm and dry. Capillary refill takes less than 2 seconds. No rash noted.  Nursing note and vitals reviewed.    ED  Treatments / Results  Labs (all labs ordered are listed, but only abnormal results are displayed) Labs Reviewed  CBC WITH DIFFERENTIAL/PLATELET - Abnormal; Notable for the following components:      Result Value   RBC 4.10 (*)    Hemoglobin 11.8 (*)    HCT 38.4 (*)    RDW 18.5 (*)    All other components within normal limits  BASIC METABOLIC PANEL - Abnormal; Notable for the following components:   BUN 21 (*)    Creatinine, Ser 1.26 (*)    Calcium 8.3 (*)    All other components within normal limits  TROPONIN I - Abnormal; Notable for the following components:   Troponin I 0.04 (*)    All other components within normal limits  BRAIN NATRIURETIC PEPTIDE - Abnormal; Notable for the following components:   B Natriuretic Peptide 2,155.0 (*)    All other components within normal limits    EKG EKG Interpretation  Date/Time:  Monday March 03 2018 11:10:46 EST Ventricular Rate:  64 PR Interval:    QRS Duration: 106 QT Interval:  431 QTC Calculation: 445 R Axis:   3 Text Interpretation:  Atrial fibrillation Borderline low voltage, extremity leads LVH with secondary repolarization abnormality Confirmed by Vanetta Mulders [phone]) on 03/03/2018 11:15:41 AM   Radiology Dg Chest 2 View  Result Date: 03/03/2018 CLINICAL DATA:  Cough.  History of atrial fibrillation EXAM: CHEST - 2 VIEW COMPARISON:  July 21, 2017 FINDINGS: Lungs are clear. Heart is enlarged with pulmonary vascularity normal. No adenopathy. No bone lesions. IMPRESSION: Cardiomegaly, stable.  No edema or consolidation. Electronically Signed   By: Bretta Bang [name] M.D.   On: 03/03/2018 10:32    Procedures Procedures (including critical care time)  Medications Ordered in ED Medications  ipratropium-albuterol (DUONEB) 0.5-2.5 (3) MG/3ML nebulizer solution 3 mL (3 mLs Nebulization Given 03/03/18 1118)  potassium chloride SA (K-DUR,KLOR-CON) CR tablet 20 mEq (20 mEq Oral Given 03/03/18 1416)  furosemide (LASIX)  injection 80 mg (80 mg Intravenous Given 03/03/18 1415)     Initial Impression / Assessment and Plan / ED Course  I have reviewed the triage vital signs and the nursing notes.  Pertinent labs & imaging results that were available during my care of the patient were reviewed by me and considered in my medical decision making (see chart for details).   CRITICAL CARE Performed by: Jazzy Parmer Total critical care time: 30 minutes Critical care time was exclusive of separately billable procedures and treating  other patients. Critical care was necessary to treat or prevent imminent or life-threatening deterioration. Critical care was time spent personally by me on the following activities: development of treatment plan with patient and/or surrogate as well as nursing, discussions with consultants, evaluation of patient's response to treatment, examination of patient, obtaining history from patient or surrogate, ordering and performing treatments and interventions, ordering and review of laboratory studies, ordering and review of radiographic studies, pulse oximetry and re-evaluation of patient's condition.       Patient with history of CHF and cardiomyopathy, no chest pain or shortness of breath.  Speech is clear and respirations are nonlabored.  Given history, I will obtain labs EKG and evaluate for CHF exacerbation.  BNP elevated as well as troponin.  Patient denies chest pain at this time, of note, previous ER visits troponin also elevated.  Will obtain delta troponin and diuresed with Lasix.  1530 patient reports feeling better after diuresis.  He is requesting discharge home.  Vital signs reassuring.  Repeat troponin stable, this may be his baseline.  He denies chest pain.  Chronic cough likely related to CHF exacerbation.  He is hemodynamically stable and I feel appropriate for discharge home since patient prefers not to be admitted.  He is strongly advised to follow-up with his PCP or  cardiologist and he agrees to this plan.  Agrees to take his regular medications as directed.  He will follow-up with his cardiologist, Dr. Diona Browner.  Strict return precautions discussed.   Final Clinical Impressions(s) / ED Diagnoses   Final diagnoses:  Cough  Acute on chronic systolic CHF (congestive heart failure) [facility])    ED Discharge Orders    None       Pauline Aus, PA-C 03/05/18 0955    Vanetta Mulders, MD 03/11/18 0719    Pauline Aus, PA-C 03/12/18 1155    Vanetta Mulders, MD 03/15/18 [phone]

## 2018-03-03 NOTE — ED Triage Notes (Addendum)
Pt c/o on-going cough x 1 month. Denies fever. States sometimes he can cough up phlegm. States sometimes he coughs so hard he has a hard time keeping medications down. Also reports coughing worsens at night.

## 2018-03-03 NOTE — ED Notes (Signed)
Date and time results received: 03/03/18 11:53 AM (use smartphrase ".now" to insert current time)  Test: troponin Critical Value: 0.04  Name of Provider Notified: [email or web address]  Orders Received? Or Actions Taken?: Orders Received - See Orders for details

## 2018-04-14 ENCOUNTER — Ambulatory Visit: Payer: Self-pay | Admitting: Orthopedic Surgery

## 2018-04-14 ENCOUNTER — Encounter: Payer: Self-pay | Admitting: Orthopedic Surgery

## 2018-04-17 ENCOUNTER — Telehealth: Payer: Self-pay | Admitting: Pharmacist

## 2018-04-17 NOTE — Telephone Encounter (Signed)
LMOM as pt is overdue for follow up. Has not been seen since Nov 2019. Received RX refill request, but no follow up scheduled. Will await call back to schedule appt to send refills.

## 2018-04-21 NOTE — Telephone Encounter (Signed)
LMOM to make sure that has enough medication to make to appt on 05/05/18.

## 2018-05-05 ENCOUNTER — Ambulatory Visit (INDEPENDENT_AMBULATORY_CARE_PROVIDER_SITE_OTHER): Payer: Medicaid Other | Admitting: Pharmacist

## 2018-05-05 DIAGNOSIS — I4891 Unspecified atrial fibrillation: Secondary | ICD-10-CM

## 2018-05-05 DIAGNOSIS — I4819 Other persistent atrial fibrillation: Secondary | ICD-10-CM | POA: Diagnosis not present

## 2018-05-05 DIAGNOSIS — I513 Intracardiac thrombosis, not elsewhere classified: Secondary | ICD-10-CM

## 2018-05-05 DIAGNOSIS — Z5181 Encounter for therapeutic drug level monitoring: Secondary | ICD-10-CM

## 2018-05-05 LAB — POCT INR: INR: 1.7 — AB (ref 2.0–3.0)

## 2018-05-05 MED ORDER — WARFARIN SODIUM 5 MG PO TABS: ORAL_TABLET | ORAL | 0 refills | Status: DC

## 2018-05-05 NOTE — Patient Instructions (Signed)
Description   Took coumadin this morning - take an extra 1/2 tablet when you get home then increase dose to 1 tablet daily except 1 1/2 tablets on Tuesdays, Thursdays and Saturdays.  Recheck in 2 weeks.

## 2018-06-05 ENCOUNTER — Other Ambulatory Visit: Payer: Self-pay | Admitting: Student

## 2018-07-09 ENCOUNTER — Other Ambulatory Visit: Payer: Self-pay | Admitting: Cardiology

## 2018-10-24 ENCOUNTER — Other Ambulatory Visit: Payer: Self-pay | Admitting: Cardiology

## 2018-10-24 ENCOUNTER — Other Ambulatory Visit: Payer: Self-pay | Admitting: Student

## 2018-10-27 ENCOUNTER — Ambulatory Visit (INDEPENDENT_AMBULATORY_CARE_PROVIDER_SITE_OTHER): Payer: Medicaid Other | Admitting: Cardiology

## 2018-10-27 ENCOUNTER — Encounter: Payer: Self-pay | Admitting: Cardiology

## 2018-10-27 ENCOUNTER — Other Ambulatory Visit: Payer: Self-pay

## 2018-10-27 VITALS — BP 118/80 | HR 81 | Temp 98.1°F | Ht 71.0 in | Wt 203.6 lb

## 2018-10-27 DIAGNOSIS — Z9119 Patient's noncompliance with other medical treatment and regimen: Secondary | ICD-10-CM

## 2018-10-27 DIAGNOSIS — I428 Other cardiomyopathies: Secondary | ICD-10-CM | POA: Diagnosis not present

## 2018-10-27 DIAGNOSIS — I10 Essential (primary) hypertension: Secondary | ICD-10-CM

## 2018-10-27 DIAGNOSIS — I4819 Other persistent atrial fibrillation: Secondary | ICD-10-CM | POA: Diagnosis not present

## 2018-10-27 DIAGNOSIS — Z91199 Patient's noncompliance with other medical treatment and regimen due to unspecified reason: Secondary | ICD-10-CM

## 2018-10-27 MED ORDER — POTASSIUM CHLORIDE CRYS ER 20 MEQ PO TBCR: 20.0000 meq | EXTENDED_RELEASE_TABLET | Freq: Two times a day (BID) | ORAL | 0 refills | Status: DC

## 2018-10-27 MED ORDER — METOPROLOL SUCCINATE ER 50 MG PO TB24: 50.0000 mg | ORAL_TABLET | Freq: Every day | ORAL | 3 refills | Status: DC

## 2018-10-27 MED ORDER — POTASSIUM CHLORIDE CRYS ER 20 MEQ PO TBCR: 20.0000 meq | EXTENDED_RELEASE_TABLET | Freq: Two times a day (BID) | ORAL | 1 refills | Status: DC

## 2018-10-27 MED ORDER — LISINOPRIL 2.5 MG PO TABS: 2.5000 mg | ORAL_TABLET | Freq: Every day | ORAL | 3 refills | Status: DC

## 2018-10-27 MED ORDER — WARFARIN SODIUM 5 MG PO TABS: ORAL_TABLET | ORAL | 0 refills | Status: DC

## 2018-10-27 NOTE — Patient Instructions (Addendum)
Your physician recommends that you schedule a follow-up appointment in: [place]  Your physician recommends that you continue on your current medications as directed. Please refer to the Current Medication list given to you today.  WE HAVE REFILLED LISINOPRIL. METOPROLOL. POTASSIUM, WARFARIN   WE WILL SCHEDULE YOU WITH LISA REID, RN COUMADIN NURSE FOR 1 WEEK FOLLOW UP  Your physician recommends that you return for lab work in: 1 WEEK BMP  Thank you for choosing [facility]!!

## 2018-10-27 NOTE — Progress Notes (Signed)
Cardiology Office Note  Date: 10/27/2018   ID: David [name], DOB [birth year], MRN [id number]  PCP:  Salley Scarlet, MD  Cardiologist:  Nona Dell, MD Electrophysiologist:  None   Chief Complaint  Patient presents with  . Cardiac follow-up    History of Present Illness: David [name] is a 58 y.o. male last seen in May 2019 by Ms. Strader PA-C.  He presents overdue for follow-up, ran out of his medications about a week and a half ago.  He tells me that he is now on disability, has Medicaid coverage.  He does have a history of recurring noncompliance with medical therapy and follow-up.  Fortunately, he has had no recent deterioration in symptoms, stable NYHA class II-[name] dyspnea but no exertional chest pain or palpitations.  I reviewed his prior cardiac regimen.  We discussed resumption of therapy with plan to reinitiate regular follow-up and ultimately reassess LVEF by echocardiography.  We will get him follow-up in the anticoagulation clinic as well, with an eye toward converting him to DOAC if he has coverage.  Last assessment of LVEF was by TEE in July 2018, at that point 20 to 25%.  He has normal coronary arteries with known nonischemic cardiomyopathy.  Past Medical History:  Diagnosis Date  . Alcohol abuse   . Arthritis    Bilateral knees   . Atrial fibrillation (HCC)   . Chronic kidney disease, stage 2, mildly decreased GFR   . GERD (gastroesophageal reflux disease)   . Gout   . Nonischemic cardiomyopathy (HCC)    a. LVEF 20-25% in 2013, improved to 50% on medical therapy. b. 09/2016: echo showing reduced EF of 15-20% and cath showing normal cors.   Marland Kitchen TIA (transient ischemic attack)     Past Surgical History:  Procedure Laterality Date  . Arthroscopic knee surgery Right 1987  . CARDIOVERSION  10/08/2011   Procedure: CARDIOVERSION;  Surgeon: Jonelle Sidle, MD;  Location: [facility] ORS;  Service: Cardiovascular;  Laterality: N/A;  Done in PACU  .  CARDIOVERSION  01/10/2012   Procedure: CARDIOVERSION;  Surgeon: Marinus Maw, MD;  Location: [facility] ENDOSCOPY;  Service: Cardiovascular;  Laterality: N/A;  . CARDIOVERSION N/A 11/17/2014   Procedure: CARDIOVERSION;  Surgeon: Laqueta Linden, MD;  Location: [facility] ORS;  Service: Cardiovascular;  Laterality: N/A;  . RIGHT/LEFT HEART CATH AND CORONARY ANGIOGRAPHY N/A 10/02/2016   Procedure: Right/Left Heart Cath and Coronary Angiography;  Surgeon: Lennette Bihari, MD;  Location: [facility] INVASIVE CV LAB;  Service: Cardiovascular;  Laterality: N/A;  . TEE WITHOUT CARDIOVERSION N/A 11/17/2014   Procedure: TRANSESOPHAGEAL ECHOCARDIOGRAM (TEE);  Surgeon: Laqueta Linden, MD;  Location: [facility] ORS;  Service: Cardiovascular;  Laterality: N/A;  . TEE WITHOUT CARDIOVERSION N/A 10/09/2016   Procedure: TRANSESOPHAGEAL ECHOCARDIOGRAM (TEE);  Surgeon: Elease Hashimoto Deloris Ping, MD;  Location: [facility] ENDOSCOPY;  Service: Cardiovascular;  Laterality: N/A;  . TOTAL KNEE ARTHROPLASTY Left 01/16/2013   Procedure: TOTAL KNEE ARTHROPLASTY;  Surgeon: Vickki Hearing, MD;  Location: [facility] ORS;  Service: Orthopedics;  Laterality: Left;    Current Outpatient Medications  Medication Sig Dispense Refill  . allopurinol (ZYLOPRIM) 300 MG tablet TAKE 1 TABLET BY MOUTH ONCE DAILY 30 tablet 5  . famotidine (PEPCID) 20 MG tablet Take 1 tablet (20 mg total) by mouth 2 (two) times daily. 18 tablet 0  . furosemide (LASIX) 40 MG tablet Take 1 tablet by mouth once daily 30 tablet 0  . lisinopril (ZESTRIL) 2.5 MG tablet Take  1 tablet (2.5 mg total) by mouth daily. 90 tablet 3  . metoprolol succinate (TOPROL-XL) 50 MG 24 hr tablet Take 1 tablet (50 mg total) by mouth daily. Take with or immediately following a meal. 90 tablet 3  . potassium chloride SA (K-DUR) 20 MEQ tablet Take 1 tablet (20 mEq total) by mouth 2 (two) times daily. 90 tablet 1  . warfarin (COUMADIN) 5 MG tablet TAKE 1 TABLET BY MOUTH ONCE DAILY EXCEPT  1  1/2  TABLETS  ON  TUESDAYS,  THURSDAYS   AND  SATURDAYS  OR  AS  DIRECTED 45 tablet 0   No current facility-administered medications for this visit.    Allergies:  Azithromycin   Social History: The patient  reports that he has never smoked. He has quit using smokeless tobacco.  His smokeless tobacco use included snuff. He reports current alcohol use of about 14.0 standard drinks of alcohol per week. He reports previous drug use. Drug: Marijuana.   ROS:  Please see the history of present illness. Otherwise, complete review of systems is positive for chronic right knee pain, currently wearing a soft brace.  All other systems are reviewed and negative.   Physical Exam: VS:  BP 118/80   Pulse 81   Temp 98.1 F (36.7 C)   Ht 5\' 11"  (1.803 m)   Wt 203 lb 9.6 oz (92.4 kg)   SpO2 98%   BMI 28.40 kg/m , BMI Body mass index is 28.4 kg/m.  Wt Readings from Last 3 Encounters:  10/27/18 203 lb 9.6 oz (92.4 kg)  03/03/18 188 lb (85.3 kg)  10/22/17 182 lb (82.6 kg)    General: Patient appears comfortable at rest. HEENT: Conjunctiva and lids normal, wearing a mask. Neck: Supple, no elevated JVP or carotid bruits, no thyromegaly. Lungs: Clear to auscultation, nonlabored breathing at rest. Cardiac: Irregular, no S3, soft systolic murmur, no pericardial rub. Abdomen: Soft, nontender, bowel sounds present, no guarding or rebound. Extremities: Trace ankle edema, distal pulses 2+. Skin: Warm and dry. Musculoskeletal: No kyphosis. Neuropsychiatric: Alert and oriented x3, affect grossly appropriate.  ECG:  An ECG dated 03/03/2018 was personally reviewed today and demonstrated:  Rate controlled atrial fibrillation with increased voltage and repolarization abnormalities.  Recent Labwork: 03/03/2018: B Natriuretic Peptide 2,155.0; BUN 21; Creatinine, Ser 1.26; Hemoglobin 11.8; Platelets 271; Potassium 3.8; Sodium 137     Component Value Date/Time   CHOL 104 10/03/2016 0744   TRIG 34 10/03/2016 0744   HDL 30 (L) 10/03/2016 0744    CHOLHDL 3.5 10/03/2016 0744   VLDL 7 10/03/2016 0744   LDLCALC 67 10/03/2016 0744    Other Studies Reviewed Today:  TEE 10/09/2016: Study Conclusions  - Left ventricle: Systolic function was severely reduced. The   estimated ejection fraction was in the range of 20% to 25%. - Aortic valve: Bicuspid. - Mitral valve: There was mild regurgitation. - Left atrium: There was a medium-sizedthrombus. There was   spontaneous echo contrast (&quot;smoke&quot;). - Tricuspid valve: There was moderate regurgitation.  Impressions:  - There is evidence of thrombus in the left atrial appendage.     Cardioversion was not performed.  Cardiac catheterization 10/02/2016: Echo documentation of ejection fraction of 15-20%.  Dilated left ventricle with an LVEDP of 27 mm.  Moderate right heart pressure elevation with moderate pulmonary hypertension.  Normal epicardial coronary arteries.  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  RECOMMENDATION: Aggressive medical therapy for a nonischemic dilated cardiomyopathy.  Consider ARB therapy with future  transition to entresto, spironolactone,  carvedilol, and consideration for initial LifeVest with possible need for future prophylactic ICD implantation.  Assessment and Plan:  1.  Nonischemic cardiomyopathy with LVEF approximately 20% by assessment in 2018.  Management has been suboptimal in the setting of noncompliance and also history of alcohol use.  Situation reviewed with the patient today, we will resume Toprol-XL, lisinopril, Lasix, and potassium supplements at prior dose.  BMET in 1 week.  Follow-up will be arranged with anticipated further medication adjustments, potentially a switch to [place] depending on his prescription coverage, and ultimately a follow-up echocardiogram.  2.  Persistent atrial fibrillation.  Resume Coumadin for now with follow-up in the anticoagulation clinic, although would consider a switch to  [place] if he has adequate prescription coverage.  3.  History of left atrial appendage thrombus.  4.  Essential hypertension.  Medication Adjustments/Labs and Tests Ordered: Current medicines are reviewed at length with the patient today.  Concerns regarding medicines are outlined above.   Tests Ordered: Orders Placed This Encounter  Procedures  . Basic Metabolic Panel (BMET)    Medication Changes: Meds ordered this encounter  Medications  . metoprolol succinate (TOPROL-XL) 50 MG 24 hr tablet    Sig: Take 1 tablet (50 mg total) by mouth daily. Take with or immediately following a meal.    Dispense:  90 tablet    Refill:  3  . DISCONTD: potassium chloride SA (K-DUR) 20 MEQ tablet    Sig: Take 1 tablet (20 mEq total) by mouth 2 (two) times daily.    Dispense:  10 tablet    Refill:  0  . lisinopril (ZESTRIL) 2.5 MG tablet    Sig: Take 1 tablet (2.5 mg total) by mouth daily.    Dispense:  90 tablet    Refill:  3  . warfarin (COUMADIN) 5 MG tablet    Sig: TAKE 1 TABLET BY MOUTH ONCE DAILY EXCEPT  1  1/2  TABLETS  ON  TUESDAYS,  THURSDAYS  AND  SATURDAYS  OR  AS  DIRECTED    Dispense:  45 tablet    Refill:  0  . potassium chloride SA (K-DUR) 20 MEQ tablet    Sig: Take 1 tablet (20 mEq total) by mouth 2 (two) times daily.    Dispense:  90 tablet    Refill:  1    Disposition:  Follow up 4 to 6 weeks in the [place] office.  Signed, Satira Sark, MD, [facility] 10/27/2018 3:10 PM    [facility] at [place], [place], [place] [address] Phone: [phone]; Fax: [phone]

## 2018-10-30 ENCOUNTER — Telehealth: Payer: Self-pay | Admitting: Cardiology

## 2018-10-30 MED ORDER — FUROSEMIDE 40 MG PO TABS: 40.0000 mg | ORAL_TABLET | Freq: Every day | ORAL | 0 refills | Status: DC

## 2018-10-30 NOTE — Telephone Encounter (Signed)
Done

## 2018-10-30 NOTE — Telephone Encounter (Signed)
Needs refill on Furosemide sent to [facility] / tg

## 2018-11-12 ENCOUNTER — Other Ambulatory Visit: Payer: Self-pay | Admitting: Cardiology

## 2018-11-12 LAB — BASIC METABOLIC PANEL WITH GFR
BUN/Creatinine Ratio: 15 (calc) (ref 6–22)
BUN: 27 mg/dL — ABNORMAL HIGH (ref 7–25)
CO2: 28 mmol/L (ref 20–32)
Calcium: 10.4 mg/dL — ABNORMAL HIGH (ref 8.6–10.3)
Chloride: 105 mmol/L (ref 98–110)
Creat: 1.77 mg/dL — ABNORMAL HIGH (ref 0.70–1.33)
GFR, Est African American: 49 mL/min/{1.73_m2} — ABNORMAL LOW (ref >=60)
GFR, Est Non African American: 42 mL/min/{1.73_m2} — ABNORMAL LOW (ref >=60)
Glucose, Bld: 101 mg/dL (ref 65–139)
Potassium: 4.5 mmol/L (ref 3.5–5.3)
Sodium: 142 mmol/L (ref 135–146)

## 2018-11-13 ENCOUNTER — Telehealth: Payer: Self-pay | Admitting: *Deleted

## 2018-11-13 NOTE — Telephone Encounter (Signed)
-----   Message from Satira Sark, MD sent at 11/13/2018 10:32 AM EDT ----- Results reviewed.  Patient recently started back on cardiac medications including ACE inhibitor.  Please have him stop ACE inhibitor for now.

## 2018-11-13 NOTE — Telephone Encounter (Signed)
Patient informed. Copy sent to PCP °

## 2018-11-20 ENCOUNTER — Other Ambulatory Visit: Payer: Self-pay

## 2018-11-20 ENCOUNTER — Ambulatory Visit (INDEPENDENT_AMBULATORY_CARE_PROVIDER_SITE_OTHER): Payer: Medicaid Other | Admitting: *Deleted

## 2018-11-20 DIAGNOSIS — I513 Intracardiac thrombosis, not elsewhere classified: Secondary | ICD-10-CM

## 2018-11-20 DIAGNOSIS — Z5181 Encounter for therapeutic drug level monitoring: Secondary | ICD-10-CM

## 2018-11-20 DIAGNOSIS — I4819 Other persistent atrial fibrillation: Secondary | ICD-10-CM | POA: Diagnosis not present

## 2018-11-20 LAB — POCT INR: INR: 1.5 — AB (ref 2.0–3.0)

## 2018-11-20 NOTE — Patient Instructions (Signed)
Been taking 1 tablet daily Take 2 tablets tonight then increase dose to 1 tablet daily except 1 1/2 tablets on Tuesdays, Thursdays and Saturdays.  Recheck in 2 weeks.

## 2018-11-25 ENCOUNTER — Telehealth: Payer: Self-pay | Admitting: Student

## 2018-11-25 NOTE — Telephone Encounter (Signed)
Virtual Visit Pre-Appointment Phone Call  "(Name), I am calling you today to discuss your upcoming appointment. We are currently trying to limit exposure to the virus that causes COVID-19 by seeing patients at home rather than in the office."  1. "What is the BEST phone number to call the day of the visit?" - include this in appointment notes  2. Do you have or have access to (through a family member/friend) a smartphone with video capability that we can use for your visit?" a. If yes - list this number in appt notes as cell (if different from BEST phone #) and list the appointment type as a VIDEO visit in appointment notes b. If no - list the appointment type as a PHONE visit in appointment notes  3. Confirm consent - "In the setting of the current Covid19 crisis, you are scheduled for a (phone or video) visit with your provider on (date) at (time).  Just as we do with many in-office visits, in order for you to participate in this visit, we must obtain consent.  If you'd like, I can send this to your mychart (if signed up) or email for you to review.  Otherwise, I can obtain your verbal consent now.  All virtual visits are billed to your insurance company just like a normal visit would be.  By agreeing to a virtual visit, we'd like you to understand that the technology does not allow for your provider to perform an examination, and thus may limit your provider's ability to fully assess your condition. If your provider identifies any concerns that need to be evaluated in person, we will make arrangements to do so.  Finally, though the technology is pretty good, we cannot assure that it will always work on either your or our end, and in the setting of a video visit, we may have to convert it to a phone-only visit.  In either situation, we cannot ensure that we have a secure connection.  Are you willing to proceed?" STAFF: Did the patient verbally acknowledge consent to telehealth visit? Document  YES/NO here: Yes  4. Advise patient to be prepared - "Two hours prior to your appointment, go ahead and check your blood pressure, pulse, oxygen saturation, and your weight (if you have the equipment to check those) and write them all down. When your visit starts, your provider will ask you for this information. If you have an Apple Watch or Kardia device, please plan to have heart rate information ready on the day of your appointment. Please have a pen and paper handy nearby the day of the visit as well."  5. Give patient instructions for MyChart download to smartphone OR [email or web address] as below if video visit (depending on what platform provider is using)  6. Inform patient they will receive a phone call 15 minutes prior to their appointment time (may be from unknown caller ID) so they should be prepared to answer    TELEPHONE CALL NOTE  David [name] has been deemed a candidate for a follow-up tele-health visit to limit community exposure during the Covid-19 pandemic. I spoke with the patient via phone to ensure availability of phone/video source, confirm preferred email & phone number, and discuss instructions and expectations.  I reminded David [name] to be prepared with any vital sign and/or heart rhythm information that could potentially be obtained via home monitoring, at the time of his visit. I reminded David [name] to expect a phone  call prior to his visit.  Terry L Goins 11/25/2018 1:03 PM

## 2018-11-26 ENCOUNTER — Telehealth (INDEPENDENT_AMBULATORY_CARE_PROVIDER_SITE_OTHER): Payer: Medicaid Other | Admitting: Student

## 2018-11-26 ENCOUNTER — Encounter: Payer: Self-pay | Admitting: Student

## 2018-11-26 VITALS — Ht 71.0 in | Wt 203.0 lb

## 2018-11-26 DIAGNOSIS — Z79899 Other long term (current) drug therapy: Secondary | ICD-10-CM

## 2018-11-26 DIAGNOSIS — N182 Chronic kidney disease, stage 2 (mild): Secondary | ICD-10-CM

## 2018-11-26 DIAGNOSIS — I5022 Chronic systolic (congestive) heart failure: Secondary | ICD-10-CM

## 2018-11-26 DIAGNOSIS — I513 Intracardiac thrombosis, not elsewhere classified: Secondary | ICD-10-CM

## 2018-11-26 DIAGNOSIS — I428 Other cardiomyopathies: Secondary | ICD-10-CM

## 2018-11-26 DIAGNOSIS — I4819 Other persistent atrial fibrillation: Secondary | ICD-10-CM

## 2018-11-26 NOTE — Patient Instructions (Addendum)
Medication Instructions: Your physician recommends that you continue on your current medications as directed. Please refer to the Current Medication list given to you today.   Labwork: BMET- get done on day you have your ECHO  Procedures/Testing: Your physician has requested that you have an echocardiogram. Echocardiography is a painless test that uses sound waves to create images of your heart. It provides your doctor with information about the size and shape of your heart and how well your heart's chambers and valves are working. This procedure takes approximately one hour. There are no restrictions for this procedure.    Follow-Up:  2 months virtual visit with B.Strader PA-C, or Dr.McDowell  Any Additional Special Instructions Will Be Listed Below (If Applicable).     If you need a refill on your cardiac medications before your next appointment, please call your pharmacy.      Thank you for choosing [facility] !

## 2018-11-26 NOTE — Progress Notes (Signed)
Virtual Visit via Telephone Note   This visit type was conducted due to national recommendations for restrictions regarding the COVID-19 Pandemic (e.g. social distancing) in an effort to limit this patient's exposure and mitigate transmission in our community.  Due to his co-morbid illnesses, this patient is at least at moderate risk for complications without adequate follow up.  This format is felt to be most appropriate for this patient at this time.  The patient did not have access to video technology/had technical difficulties with video requiring transitioning to audio format only (telephone).  All issues noted in this document were discussed and addressed.  No physical exam could be performed with this format.  Please refer to the patient's chart for his  consent to telehealth for [facility].   Date:  11/26/2018   ID:  David [name], DOB Jun 26, 1960, MRN [id number]  Patient Location: Home Provider Location: Office  PCP:  Salley Scarleturham, Kawanta F, MD  Cardiologist:  Nona DellSamuel McDowell, MD  Electrophysiologist:  None   Evaluation Performed:  Follow-Up Visit  Chief Complaint: 1 month visit; No specific complaints  History of Present Illness:    David BihariRussell N Rasnic [name] is a 58 y.o. male with past medical history of chronic systolic CHF/NICM (EF 15-20% in 2018 with cath in 09/2016 showing normal cors), persistent atrial fibrillation, LAA thrombus in 10/2016, HTN, HLD, and medication noncompliance who presents for a 1 month telehealth visit.  He was last examined by Dr. Diona BrownerMcDowell in 10/2018 and reported being out of his medications at that time for at least a week. He reported having baseline dyspnea but denied any recent chest pain or palpitations. He was restarted on Toprol-XL 50 mg daily, Lisinopril 2.5 mg daily, Lasix 40 mg daily, and Coumadin with possible switch to Entresto pending BP response. Follow-up labs on 11/12/2018 showed that his creatinine had increased from 1.26 in in 2019 to  1.77, therefore Lisinopril was discontinued.   In talking with the patient today, he reports overall doing well since his last office visit. Denies any recent chest pain, dyspnea on exertion, or palpitations. No recent orthopnea, PND, or edema. Weight has been stable on his home scales, currently at 203 lbs.   He reports good compliance with his current medication regimen. Did stop Lisinopril as previously instructed. He owns a BP cuff but is unsure of where it is currently located as his wife uses this more frequently than he does. [facility] is [facility]. He denies any recent melena, hematochezia, or hematuria.   The patient does not have symptoms concerning for COVID-19 infection (fever, chills, cough, or new shortness of breath).    Past Medical History:  Diagnosis Date  . Alcohol abuse   . Arthritis    Bilateral knees   . Atrial fibrillation (HCC)   . Chronic kidney disease, stage 2, mildly decreased GFR   . GERD (gastroesophageal reflux disease)   . Gout   . Nonischemic cardiomyopathy (HCC)    a. LVEF 20-25% in 2013, improved to 50% on medical therapy. b. 09/2016: echo showing reduced EF of 15-20% and cath showing normal cors.   Marland Kitchen. TIA (transient ischemic attack)    Past Surgical History:  Procedure Laterality Date  . Arthroscopic knee surgery Right 1987  . CARDIOVERSION  10/08/2011   Procedure: CARDIOVERSION;  Surgeon: Jonelle SidleSamuel G McDowell, MD;  Location: [facility];  Service: Cardiovascular;  Laterality: N/A;  Done in PACU  . CARDIOVERSION  01/10/2012   Procedure: CARDIOVERSION;  Surgeon: Marinus MawGregg W Taylor, MD;  [facility];  Service: Cardiovascular;  Laterality: N/A;  . CARDIOVERSION N/A 11/17/2014   Procedure: CARDIOVERSION;  Surgeon: Laqueta LindenSuresh A Koneswaran, MD;  Location: [facility];  Service: Cardiovascular;  Laterality: N/A;  . RIGHT/LEFT HEART CATH AND CORONARY ANGIOGRAPHY N/A 10/02/2016   Procedure: Right/Left Heart Cath and Coronary Angiography;  Surgeon: David BihariKelly,  Thomas A, MD;  [facility];  Service: Cardiovascular;  Laterality: N/A;  . TEE WITHOUT CARDIOVERSION N/A 11/17/2014   Procedure: TRANSESOPHAGEAL ECHOCARDIOGRAM (TEE);  Surgeon: Laqueta LindenSuresh A Koneswaran, MD;  Location: [facility];  Service: Cardiovascular;  Laterality: N/A;  . TEE WITHOUT CARDIOVERSION N/A 10/09/2016   Procedure: TRANSESOPHAGEAL ECHOCARDIOGRAM (TEE);  Surgeon: Elease HashimotoNahser, Deloris PingPhilip J, MD;  [facility];  Service: Cardiovascular;  Laterality: N/A;  . TOTAL KNEE ARTHROPLASTY Left 01/16/2013   Procedure: TOTAL KNEE ARTHROPLASTY;  Surgeon: Vickki HearingStanley E Harrison, MD;  Location: [facility];  Service: Orthopedics;  Laterality: Left;     Current Meds  Medication Sig  . allopurinol (ZYLOPRIM) 300 MG tablet TAKE 1 TABLET BY MOUTH ONCE DAILY  . famotidine (PEPCID) 20 MG tablet Take 1 tablet (20 mg total) by mouth 2 (two) times daily.  . furosemide (LASIX) 40 MG tablet Take 1 tablet (40 mg total) by mouth daily.  . metoprolol succinate (TOPROL-XL) 50 MG 24 hr tablet Take 1 tablet (50 mg total) by mouth daily. Take with or immediately following a meal.  . potassium chloride SA (K-DUR) 20 MEQ tablet Take 1 tablet (20 mEq total) by mouth 2 (two) times daily.  Marland Kitchen. warfarin (COUMADIN) 5 MG tablet TAKE 1 TABLET BY MOUTH ONCE DAILY EXCEPT  1  1/2  TABLETS  ON  TUESDAYS,  THURSDAYS  AND  SATURDAYS  OR  AS  DIRECTED     Allergies:   Azithromycin   Social History   Tobacco Use  . Smoking status: Never Smoker  . Smokeless tobacco: Former NeurosurgeonUser    Types: Snuff  . Tobacco comment: quit 1980's  Substance Use Topics  . Alcohol use: Yes    Alcohol/week: 14.0 standard drinks    Types: 14 Cans of beer per week    Comment: 1-2 beers daily   . Drug use: Not Currently    Types: Marijuana    Comment: last time 2 years ago     Family Hx: The patient's family history includes Hypertension in his brother, mother, and sister.  ROS:   Please see the history of present illness.     All other  systems reviewed and are negative.   Prior CV studies:   The following studies were reviewed today:  Cardiac Catheterization: 09/2016 Echo documentation of ejection fraction of 15-20%.  Dilated left ventricle with an LVEDP of 27 mm.  Moderate right heart pressure elevation with moderate pulmonary hypertension.  Normal epicardial coronary arteries.  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  RECOMMENDATION: Aggressive medical therapy for a nonischemic dilated cardiomyopathy.  Consider ARB therapy with future transition to entresto, spironolactone,  carvedilol, and consideration for initial LifeVest with possible need for future prophylactic ICD implantation.  TEE: 10/2016 Study Conclusions  - Left ventricle: Systolic function was severely reduced. The   estimated ejection fraction was in the range of 20% to 25%. - Aortic valve: Bicuspid. - Mitral valve: There was mild regurgitation. - Left atrium: There was a medium-sizedthrombus. There was   spontaneous echo contrast (&quot;smoke&quot;). - Tricuspid valve: There was moderate regurgitation.  Impressions:  - There  is evidence of thrombus in the left atrial appendage.     Cardioversion was not performed.  Labs/Other Tests and Data Reviewed:    EKG:  No ECG reviewed.  Recent Labs: 03/03/2018: B Natriuretic Peptide 2,155.0; Hemoglobin 11.8; Platelets 271 11/12/2018: BUN 27; Creat 1.77; Potassium 4.5; Sodium 142   Recent Lipid Panel Lab Results  Component Value Date/Time   CHOL 104 10/03/2016 07:44 AM   TRIG 34 10/03/2016 07:44 AM   HDL 30 (L) 10/03/2016 07:44 AM   CHOLHDL 3.5 10/03/2016 07:44 AM   LDLCALC 67 10/03/2016 07:44 AM    Wt Readings from Last 3 Encounters:  11/26/18 203 lb (92.1 kg)  10/27/18 203 lb 9.6 oz (92.4 kg)  03/03/18 188 lb (85.3 kg)     Objective:    Vital Signs:  Ht 5\' 11"  (1.803 m)   Wt 203 lb (92.1 kg)   BMI 28.31 kg/m    General: Pleasant male  sounding in NAD Psych: Normal affect. Neuro: Alert and oriented X 3.  Lungs:  Resp regular and unlabored while talking on the phone.   ASSESSMENT & PLAN:    1. Chronic Systolic CHF/NICM  - EF 66-06% in 2018 with cath in 09/2016 showing normal cors. Was recently restarted on Toprol-XL, Lasix, and Lisinopril but creatinine acutely changed with the addition of Lisinopril and this was discontinued. He has overall done well and denies any dyspnea on exertion, orthopnea, PND, or edema. Weight has been stable on his home scales. Continued compliance with sodium and fluid restriction reviewed.  - reviewed options with the patient and given the time since his last study and overall compliance with medications at this time (says he had missed less than 1 week of medications at the time of his last visit), will plan to obtain a repeat echocardiogram next month. If EF remains reduced, would try adding low-dose Hydralazine if BP allows given variable renal function which limits the use of ACE-I/ARB/ARNI. Continue Toprol-XL at current dosing. Will recheck BMET as he remains on Lasix 40mg  daily.  2. HTN - he has a BP cuff at home but was unable to locate this for his visit today. I have asked him to check BP a few times per week and record the results as issues with hypotension have limited titration of his cardiac medications in the past. Continue Toprol-XL 50mg  daily for now.   3. Persistent Atrial Fibrillation - he denies any recent palpitations. Continue Toprol-XL for rate-control. - he denies any evidence of active bleeding. On Coumadin for anticoagulation.   4. LAA Thrombus - noted on echo in 2018 as outlined above. Will plan to obtain a repeat echocardiogram. Continue anticoagulation.   5. Stage 2-3 CKD - creatinine 1.2 - 1.4 in 2019. Elevated to 1.77 following recent initiation of Lisinopril. Will obtain a repeat BMET for reassessment. If still above baseline, would consider dose reduction of Lasix to  20mg  daily.    COVID-19 Education: The signs and symptoms of COVID-19 were discussed with the patient and how to seek care for testing (follow up with PCP or arrange E-visit).  The importance of social distancing was discussed today.  Time:   Today, I have spent 16 minutes with the patient with telehealth technology discussing the above problems.     Medication Adjustments/Labs and Tests Ordered: Current medicines are reviewed at length with the patient today.  Concerns regarding medicines are outlined above.   Tests Ordered: Orders Placed This Encounter  Procedures  . Basic Metabolic Panel (  BMET)  . ECHOCARDIOGRAM COMPLETE    Medication Changes: No orders of the defined types were placed in this encounter.   Follow Up:  Virtual Visit in 2-3 months.   Signed, Ellsworth Lennox, PA-C  11/26/2018 1:36 PM    [facility]

## 2018-12-03 ENCOUNTER — Other Ambulatory Visit: Payer: Self-pay

## 2018-12-03 ENCOUNTER — Ambulatory Visit (HOSPITAL_COMMUNITY)
Admission: RE | Admit: 2018-12-03 | Discharge: 2018-12-03 | Disposition: A | Discharge status: Home / Self Care | Payer: Medicaid Other | Source: Ambulatory Visit | Attending: Student | Admitting: Student

## 2018-12-03 ENCOUNTER — Other Ambulatory Visit (HOSPITAL_COMMUNITY)
Admission: RE | Admit: 2018-12-03 | Discharge: 2018-12-03 | Disposition: A | Discharge status: Home / Self Care | Payer: Medicaid Other | Source: Ambulatory Visit | Attending: Student | Admitting: Student

## 2018-12-03 DIAGNOSIS — I428 Other cardiomyopathies: Secondary | ICD-10-CM | POA: Insufficient documentation

## 2018-12-03 DIAGNOSIS — I5022 Chronic systolic (congestive) heart failure: Secondary | ICD-10-CM

## 2018-12-03 DIAGNOSIS — Z79899 Other long term (current) drug therapy: Secondary | ICD-10-CM | POA: Diagnosis present

## 2018-12-03 LAB — BASIC METABOLIC PANEL
Anion gap: 7 (ref 5–15)
BUN: 24 mg/dL — ABNORMAL HIGH (ref 6–20)
CO2: 26 mmol/L (ref 22–32)
Calcium: 9 mg/dL (ref 8.9–10.3)
Chloride: 106 mmol/L (ref 98–111)
Creatinine, Ser: 1.43 mg/dL — ABNORMAL HIGH (ref 0.61–1.24)
GFR calc Af Amer: >60 mL/min (ref >60)
GFR calc non Af Amer: 54 mL/min — ABNORMAL LOW (ref >60)
Glucose, Bld: 90 mg/dL (ref 70–99)
Potassium: 4.5 mmol/L (ref 3.5–5.1)
Sodium: 139 mmol/L (ref 135–145)

## 2018-12-03 NOTE — Progress Notes (Signed)
*  PRELIMINARY RESULTS* Echocardiogram 2D Echocardiogram has been performed.  David [name] 12/03/2018, 12:41 PM

## 2018-12-04 ENCOUNTER — Telehealth: Payer: Self-pay | Admitting: *Deleted

## 2018-12-04 NOTE — Telephone Encounter (Signed)
Called patient with test results. No answer. Unable to leave msg d/t mailbox not being setup.

## 2018-12-04 NOTE — Telephone Encounter (Signed)
-----   Message from Erma Heritage, [place] sent at 12/03/2018  7:47 PM EDT ----- Please let the patient know his heart pumping function has improved but remains reduced. Normal ejection fraction is 55-60%. His was 15-20% in 2018, now at 25-30%. He does have leakage along the aortic valve which has progressed within the past few years. Would continue with Toprol-XL. Kidney function did not allow for the addition of Lisinopril. Would recommend starting Hydralazine 12.5mg  BID. Would follow BP with this as we would like to further titrate this to help with the heart pumping function if BP allows. This medication should not impact his renal function like Lisinopril did in the past. Please keep scheduled follow-up with Dr. Domenic Polite so echo results can be reviewed in detail and further medication adjustments can be made.

## 2018-12-08 ENCOUNTER — Other Ambulatory Visit: Payer: Self-pay | Admitting: Cardiovascular Disease

## 2018-12-08 ENCOUNTER — Other Ambulatory Visit: Payer: Self-pay | Admitting: Cardiology

## 2018-12-10 ENCOUNTER — Telehealth: Payer: Self-pay | Admitting: *Deleted

## 2018-12-10 MED ORDER — HYDRALAZINE HCL 25 MG PO TABS: 12.5000 mg | ORAL_TABLET | Freq: Two times a day (BID) | ORAL | 3 refills | Status: DC

## 2018-12-10 NOTE — Telephone Encounter (Signed)
-----   Message from Brittany M Strader, PA-C sent at 12/03/2018  7:47 PM EDT ----- Please let the patient know his heart pumping function has improved but remains reduced. Normal ejection fraction is 55-60%. His was 15-20% in 2018, now at 25-30%. He does have leakage along the aortic valve which has progressed within the past few years. Would continue with Toprol-XL. Kidney function did not allow for the addition of Lisinopril. Would recommend starting Hydralazine 12.5mg BID. Would follow BP with this as we would like to further titrate this to help with the heart pumping function if BP allows. This medication should not impact his renal function like Lisinopril did in the past. Please keep scheduled follow-up with Dr. McDowell so echo results can be reviewed in detail and further medication adjustments can be made. 

## 2018-12-12 ENCOUNTER — Telehealth: Payer: Self-pay | Admitting: Orthopedic Surgery

## 2018-12-12 NOTE — Telephone Encounter (Signed)
Patient states he called in to our office Mon, 12/08/18

## 2018-12-12 NOTE — Telephone Encounter (Signed)
Cont'd 12/12/18 Patient states he called in to our office Mon, 12/08/18, for medication, which he states Dr Luna Glasgow prescribed several years ago and was told he would be on the medicine rest of his life; last prescribing provider in 2019 is Dr Johnsie Cancel - patient was last seen here by Dr Aline Brochure 05/07/16 - please advise:  allopurinol (ZYLOPRIM) 300 MG tablet 30 tablet  - [place]

## 2018-12-16 ENCOUNTER — Telehealth: Payer: Self-pay | Admitting: Pharmacist

## 2018-12-16 MED ORDER — ALLOPURINOL 300 MG PO TABS: 300.0000 mg | ORAL_TABLET | Freq: Every day | ORAL | 5 refills | Status: DC

## 2018-12-16 NOTE — Telephone Encounter (Signed)
Thank you  for update

## 2018-12-16 NOTE — Telephone Encounter (Signed)
Called pt to discuss changing from warfarin to [place]. Per last office visit with Dr [facility] in July, plan was to consider changing to [place] if pt got insurance. He now has Medicaid which covers DOACs well. Pt is agreeable to changing to Eliquis at his next INR check on 9/10. He will qualify for Eliquis 5mg  BID for afib indication with age < 65 and weight > 60kg. 3 month supply will cost $8.95 with his insurance.

## 2018-12-18 ENCOUNTER — Ambulatory Visit (INDEPENDENT_AMBULATORY_CARE_PROVIDER_SITE_OTHER): Payer: Medicaid Other | Admitting: *Deleted

## 2018-12-18 ENCOUNTER — Other Ambulatory Visit: Payer: Self-pay

## 2018-12-18 DIAGNOSIS — I4819 Other persistent atrial fibrillation: Secondary | ICD-10-CM

## 2018-12-18 DIAGNOSIS — Z5181 Encounter for therapeutic drug level monitoring: Secondary | ICD-10-CM

## 2018-12-18 DIAGNOSIS — I513 Intracardiac thrombosis, not elsewhere classified: Secondary | ICD-10-CM | POA: Diagnosis not present

## 2018-12-18 LAB — POCT INR: INR: 2.2 (ref 2.0–3.0)

## 2018-12-18 NOTE — Patient Instructions (Signed)
Continue coumadin 1 tablet daily except 1 1/2 tablets on Tuesdays, Thursdays and Saturdays Recheck in 3 weeks 

## 2018-12-22 ENCOUNTER — Other Ambulatory Visit: Payer: Self-pay | Admitting: Cardiology

## 2019-01-15 ENCOUNTER — Ambulatory Visit (INDEPENDENT_AMBULATORY_CARE_PROVIDER_SITE_OTHER): Payer: Medicaid Other | Admitting: *Deleted

## 2019-01-15 DIAGNOSIS — I4819 Other persistent atrial fibrillation: Secondary | ICD-10-CM | POA: Diagnosis not present

## 2019-01-15 DIAGNOSIS — Z5181 Encounter for therapeutic drug level monitoring: Secondary | ICD-10-CM

## 2019-01-15 DIAGNOSIS — I513 Intracardiac thrombosis, not elsewhere classified: Secondary | ICD-10-CM | POA: Diagnosis not present

## 2019-01-15 LAB — POCT INR: INR: 1.6 — AB (ref 2.0–3.0)

## 2019-01-15 NOTE — Patient Instructions (Signed)
Take warfarin 2 tablets tonight then increase dose to 1 1/2 tablets daily except 1 tablet on Sundays and Wednesdays.  Recheck in 2 weeks. Pt does not want to change to Eliquis due to cost ($8.95 vs $3.00 for warfarin)  Limited budget

## 2019-01-22 NOTE — Telephone Encounter (Signed)
Called pt to discuss Eliquis again - he did not change from warfarin to Eliquis at recent visit due to cost (reported $3 for Coumadin vs $8.95 for Eliquis). Advised pt that the $8.95 copay for Eliquis is for a 3 month supply, not a 1 month. He is paying $3 each month for Coumadin already, so the copay is the same. Added benefits of changing to Eliquis include less frequent monitoring, no need to worry about staying consistent with greens in diet or medication interactions, and better efficacy and safety profile.  Pt is agreeable to changing at next appt, moved until after 11/1 per pt request as he gets paid on the 1st of each month and would be able to afford the Eliquis copay at that time.

## 2019-01-26 ENCOUNTER — Encounter: Payer: Self-pay | Admitting: Cardiology

## 2019-01-26 ENCOUNTER — Telehealth: Payer: Self-pay | Admitting: Licensed Clinical Social Worker

## 2019-01-26 ENCOUNTER — Telehealth (INDEPENDENT_AMBULATORY_CARE_PROVIDER_SITE_OTHER): Payer: Medicaid Other | Admitting: Cardiology

## 2019-01-26 VITALS — Ht 71.0 in | Wt 206.0 lb

## 2019-01-26 DIAGNOSIS — I5022 Chronic systolic (congestive) heart failure: Secondary | ICD-10-CM

## 2019-01-26 DIAGNOSIS — I428 Other cardiomyopathies: Secondary | ICD-10-CM | POA: Diagnosis not present

## 2019-01-26 DIAGNOSIS — I4819 Other persistent atrial fibrillation: Secondary | ICD-10-CM

## 2019-01-26 DIAGNOSIS — N1832 Chronic kidney disease, stage 3b: Secondary | ICD-10-CM

## 2019-01-26 NOTE — Patient Instructions (Signed)
Medication Instructions:  Your physician recommends that you continue on your current medications as directed. Please refer to the Current Medication list given to you today.  *If you need a refill on your cardiac medications before your next appointment, please call your pharmacy*  Lab Work: BMET 2 days BEFORE visit with Bernerd Pho, PA-C in 6 weeks If you have labs (blood work) drawn today and your tests are completely normal, you will receive your results only by: Marland Kitchen MyChart Message (if you have MyChart) OR . A paper copy in the mail If you have any lab test that is abnormal or we need to change your treatment, we will call you to review the results.  Testing/Procedures: none  Follow-Up: At [facility], you and your health needs are our priority.  As part of our continuing mission to provide you with exceptional heart care, we have created designated Provider Care Teams.  These Care Teams include your primary Cardiologist (physician) and Advanced Practice Providers (APPs -  Physician Assistants and Nurse Practitioners) who all work together to provide you with the care you need, when you need it.  Your next appointment:  im 6 weeks   The format for your next appointment:   In Person  Provider:   You will follow up with the following Advanced Practice Providers on your designated Care Team:    Bernerd Pho, [place]      Other Instructions

## 2019-01-26 NOTE — Telephone Encounter (Signed)
CSW referred to assist patient with obtaining a BP cuff. CSW contacted patient to inform cuff will be delivered to home.Unable to leave message as no voicemail. CSW available as needed. Raquel Sarna, [place], [place]

## 2019-01-26 NOTE — Progress Notes (Addendum)
Virtual Visit via Telephone Note   This visit type was conducted due to national recommendations for restrictions regarding the COVID-19 Pandemic (e.g. social distancing) in an effort to limit this patient's exposure and mitigate transmission in our community.  Due to his co-morbid illnesses, this patient is at least at moderate risk for complications without adequate follow up.  This format is felt to be most appropriate for this patient at this time.  The patient did not have access to video technology/had technical difficulties with video requiring transitioning to audio format only (telephone).  All issues noted in this document were discussed and addressed.  No physical exam could be performed with this format.  Please refer to the patient's chart for his  consent to telehealth for [facility].   Date:  01/26/2019   ID:  David [name], DOB Oct 06, 1960, MRN [id number]  Patient Location: Home Provider Location: Office  PCP:  Salley Scarlet, MD  Cardiologist:  Nona Dell, MD Electrophysiologist:  None   Evaluation Performed:  Follow-Up Visit  Chief Complaint:   Cardiac follow-up  History of Present Illness:    David [name] is a 58 y.o. male last assessed in August by Ms. Strader PA-C via telehealth encounter.  We spoke by phone today.  He tells me that he feels well overall.  No specific palpitations, NYHA class II dyspnea, no leg swelling or orthopnea.  He has a scale at home, states that he weighs intermittently.  His weight is up by only 3 pounds compared to August.  He remains on Coumadin with follow-up in the anticoagulation clinic.  Plan is to switch him to Eliquis at the next follow-up visit.  He did experience acute renal insufficiency after resumption of lisinopril with creatinine up to 1.7, medication was discontinued.  He was subsequently started on hydralazine.  He did not have a way to check his vital signs for today's encounter.  Follow-up  echocardiogram in August showed LVEF 25 to 30% which did represent an improvement, moderate to severe aortic regurgitation, and mild to moderate dilatation of the aortic root.  The patient does not have symptoms concerning for COVID-19 infection (fever, chills, cough, or new shortness of breath).    Past Medical History:  Diagnosis Date  . Alcohol abuse   . Arthritis    Bilateral knees   . Atrial fibrillation (HCC)   . Chronic kidney disease, stage 2, mildly decreased GFR   . GERD (gastroesophageal reflux disease)   . Gout   . Nonischemic cardiomyopathy (HCC)    a. LVEF 20-25% in 2013, improved to 50% on medical therapy. b. 09/2016: echo showing reduced EF of 15-20% and cath showing normal cors.   Marland Kitchen TIA (transient ischemic attack)    Past Surgical History:  Procedure Laterality Date  . Arthroscopic knee surgery Right 1987  . CARDIOVERSION  10/08/2011   Procedure: CARDIOVERSION;  Surgeon: Jonelle Sidle, MD;  Location: [facility];  Service: Cardiovascular;  Laterality: N/A;  Done in PACU  . CARDIOVERSION  01/10/2012   Procedure: CARDIOVERSION;  Surgeon: Marinus Maw, MD;  Location: [facility] ENDOSCOPY;  Service: Cardiovascular;  Laterality: N/A;  . CARDIOVERSION N/A 11/17/2014   Procedure: CARDIOVERSION;  Surgeon: Laqueta Linden, MD;  Location: [facility]S;  Service: Cardiovascular;  Laterality: N/A;  . RIGHT/LEFT HEART CATH AND CORONARY ANGIOGRAPHY N/A 10/02/2016   Procedure: Right/Left Heart Cath and Coronary Angiography;  Surgeon: David Bihari, MD;  Location: [facility] INVASIVE CV LAB;  Service:  Cardiovascular;  Laterality: N/A;  . TEE WITHOUT CARDIOVERSION N/A 11/17/2014   Procedure: TRANSESOPHAGEAL ECHOCARDIOGRAM (TEE);  Surgeon: Herminio Commons, MD;  Location: [facility];  Service: Cardiovascular;  Laterality: N/A;  . TEE WITHOUT CARDIOVERSION N/A 10/09/2016   Procedure: TRANSESOPHAGEAL ECHOCARDIOGRAM (TEE);  Surgeon: Acie Fredrickson Wonda Cheng, MD;  Location: [facility] ENDOSCOPY;  Service: Cardiovascular;   Laterality: N/A;  . TOTAL KNEE ARTHROPLASTY Left 01/16/2013   Procedure: TOTAL KNEE ARTHROPLASTY;  Surgeon: Carole Civil, MD;  Location: [facility]S;  Service: Orthopedics;  Laterality: Left;     Current Meds  Medication Sig  . allopurinol (ZYLOPRIM) 300 MG tablet Take 1 tablet (300 mg total) by mouth daily.  . famotidine (PEPCID) 20 MG tablet Take 1 tablet (20 mg total) by mouth 2 (two) times daily.  . furosemide (LASIX) 40 MG tablet Take 1 tablet by mouth once daily  . hydrALAZINE (APRESOLINE) 25 MG tablet Take 0.5 tablets (12.5 mg total) by mouth 2 (two) times daily.  . metoprolol succinate (TOPROL-XL) 50 MG 24 hr tablet Take 1 tablet (50 mg total) by mouth daily. Take with or immediately following a meal.  . potassium chloride SA (K-DUR) 20 MEQ tablet Take 1 tablet (20 mEq total) by mouth 2 (two) times daily.  Marland Kitchen warfarin (COUMADIN) 5 MG tablet TAKE 1 TABLET BY MOUTH ONCE DAILY EXCEPT  1  AND  1/2  TABS  ON  TUESDAYS,  THURSDAYS,  AND  SATURDAYS  OR  AS  DIRECTED     Allergies:   Azithromycin   Social History   Tobacco Use  . Smoking status: Never Smoker  . Smokeless tobacco: Former Systems developer    Types: Snuff  . Tobacco comment: quit 1980's  Substance Use Topics  . Alcohol use: Yes    Alcohol/week: 14.0 standard drinks    Types: 14 Cans of beer per week    Comment: 1-2 beers daily   . Drug use: Not Currently    Types: Marijuana    Comment: last time 2 years ago     Family Hx: The patient's family history includes Hypertension in his brother, mother, and sister.  ROS:   Please see the history of present illness. All other systems reviewed and are negative.   Prior CV studies:   The following studies were reviewed today:  Echocardiogram 12/03/2018:  1. The left ventricle has severely reduced systolic function, with an ejection fraction of 25-30%. The cavity size was normal. There is moderate concentric left ventricular hypertrophy. Left ventricular diastolic function could  not be evaluated  secondary to atrial fibrillation. Left ventricular diffuse hypokinesis.  2. Left atrial size was severely dilated.  3. Right atrial size was moderately dilated.  4. The aortic valve is tricuspid. Moderate thickening of the aortic valve. Mild calcification of the aortic valve. Aortic valve regurgitation is moderate to severe by color flow Doppler. Mild aortic annular calcification noted.  5. The mitral valve is degenerative. Mild thickening of the mitral valve leaflet. There is mild mitral annular calcification present.  6. The tricuspid valve is grossly normal.  7. The aorta is abnormal unless otherwise noted.  8. There is mild to moderate dilatation of the aortic root.  Labs/Other Tests and Data Reviewed:    EKG:  An ECG dated 03/03/2018 was personally reviewed today and demonstrated:  Atrial fibrillation with low voltage in the limb leads, repolarization abnormalities.  Recent Labs: 03/03/2018: B Natriuretic Peptide 2,155.0; Hemoglobin 11.8; Platelets 271 12/03/2018: BUN 24; Creatinine, Ser 1.43; Potassium 4.5;  Sodium 139    Wt Readings from Last 3 Encounters:  01/26/19 206 lb (93.4 kg)  11/26/18 203 lb (92.1 kg)  10/27/18 203 lb 9.6 oz (92.4 kg)     Objective:    Vital Signs:  Ht 5\' 11"  (1.803 m)   Wt 206 lb (93.4 kg)   BMI 28.73 kg/m   He did not have a way to check vital signs today. Patient spoke in full sentences, not short of breath. No audible wheezing or coughing. Speech pattern normal.  ASSESSMENT & PLAN:    1.  Nonischemic cardiomyopathy, LVEF improved somewhat to the range of 25 to 30% by echocardiogram in August.  He is currently on Toprol-XL, hydralazine, and Lasix with potassium supplements.  He had acute on chronic renal insufficiency with resumption of lisinopril and therefore we are holding off on ARB, Aldactone or ANRI at this time.  2.  Chronic systolic heart failure.  Continue with present diuretic regimen.  I encouraged him to weigh  daily.  3.  CKD stage [name], last creatinine was down to 1.43 after discontinuation of lisinopril.  Follow-up BMET for next visit.  4.  Persistent atrial fibrillation with history of left atrial appendage thrombus.  Plan is to switch from Coumadin to Eliquis at next anticoagulation clinic visit.  5.  Moderate to severe aortic regurgitation.  We will continue to follow.  COVID-19 Education: The signs and symptoms of COVID-19 were discussed with the patient and how to seek care for testing (follow up with PCP or arrange E-visit).  The importance of social distancing was discussed today.  Time:   Today, I have spent 5 minutes with the patient with telehealth technology discussing the above problems.     Medication Adjustments/Labs and Tests Ordered: Current medicines are reviewed at length with the patient today.  Concerns regarding medicines are outlined above.   Tests Ordered: Orders Placed This Encounter  Procedures  . Basic Metabolic Panel (BMET)    Medication Changes: No orders of the defined types were placed in this encounter.   Follow Up:  In Person 6 weeks in the [place] office with [place].  Signed, Nona Dell, MD  01/26/2019 9:41 AM    [facility]

## 2019-02-10 ENCOUNTER — Other Ambulatory Visit: Payer: Self-pay | Admitting: *Deleted

## 2019-02-10 DIAGNOSIS — Z20822 Contact with and (suspected) exposure to covid-19: Secondary | ICD-10-CM

## 2019-02-11 LAB — NOVEL CORONAVIRUS, NAA: SARS-CoV-2, NAA: NOT DETECTED

## 2019-02-12 ENCOUNTER — Ambulatory Visit (INDEPENDENT_AMBULATORY_CARE_PROVIDER_SITE_OTHER): Payer: Medicaid Other | Admitting: *Deleted

## 2019-02-12 ENCOUNTER — Other Ambulatory Visit: Payer: Self-pay

## 2019-02-12 DIAGNOSIS — Z5181 Encounter for therapeutic drug level monitoring: Secondary | ICD-10-CM

## 2019-02-12 DIAGNOSIS — I513 Intracardiac thrombosis, not elsewhere classified: Secondary | ICD-10-CM

## 2019-02-12 DIAGNOSIS — I4819 Other persistent atrial fibrillation: Secondary | ICD-10-CM

## 2019-02-12 LAB — POCT INR: INR: 1.7 — AB (ref 2.0–3.0)

## 2019-02-12 MED ORDER — APIXABAN 5 MG PO TABS: 5.0000 mg | ORAL_TABLET | Freq: Two times a day (BID) | ORAL | 3 refills | Status: DC

## 2019-02-12 NOTE — Patient Instructions (Signed)
Here to change from Warfarin to Eliquis Start eliquis 5mg  twice daily starting tonight.  Take appx 12 hours apart. Come back in 4-6 weeks for 1 month follow up.

## 2019-03-09 LAB — BASIC METABOLIC PANEL
BUN/Creatinine Ratio: 16 (calc) (ref 6–22)
BUN: 24 mg/dL (ref 7–25)
CO2: 30 mmol/L (ref 20–32)
Calcium: 9.4 mg/dL (ref 8.6–10.3)
Chloride: 102 mmol/L (ref 98–110)
Creat: 1.5 mg/dL — ABNORMAL HIGH (ref 0.70–1.33)
Glucose, Bld: 87 mg/dL (ref 65–99)
Potassium: 4.4 mmol/L (ref 3.5–5.3)
Sodium: 139 mmol/L (ref 135–146)

## 2019-03-10 ENCOUNTER — Ambulatory Visit: Payer: Medicaid Other | Admitting: Student

## 2019-03-10 NOTE — Progress Notes (Deleted)
Cardiology Office Note    Date:  03/10/2019   ID:  David [name], DOB 19-Jul-1960, MRN [id number]  PCP:  Salley Scarlet, MD  Cardiologist: Nona Dell, MD    No chief complaint on file.   History of Present Illness:    David [name] is a 58 y.o. male with past medical history of Chronic Systolic CHF/NICM (EF 15-20% by echo in 2018 with cath showing normal cors, EF 25-30% by echo in 11/2018), moderate to severe AI, LAA thrombus (by prior echo but not mentioned in 11/2018), HTN, HLD, and persistent atrial fibrillation who presents to the office today for 52-month follow-up.  He most recently had a telehealth visit with Dr. Diona Browner in 01/2019 denied any recent chest pain or palpitations.  He was continued on Toprol-XL, Hydralazine and Lasix given he had acute on chronic renal insufficiency with Lisinopril earlier in the year.  By review of notes, he did follow-up with the Coumadin clinic and was transitioned from Coumadin to Eliquis on 02/12/2019.   Past Medical History:  Diagnosis Date  . Alcohol abuse   . Arthritis    Bilateral knees   . Atrial fibrillation (HCC)   . Chronic kidney disease, stage 2, mildly decreased GFR   . GERD (gastroesophageal reflux disease)   . Gout   . Nonischemic cardiomyopathy (HCC)    a. LVEF 20-25% in 2013, improved to 50% on medical therapy. b. 09/2016: echo showing reduced EF of 15-20% and cath showing normal cors.   Marland Kitchen TIA (transient ischemic attack)     Past Surgical History:  Procedure Laterality Date  . Arthroscopic knee surgery Right 1987  . CARDIOVERSION  10/08/2011   Procedure: CARDIOVERSION;  Surgeon: Jonelle Sidle, MD;  Location: [facility];  Service: Cardiovascular;  Laterality: N/A;  Done in PACU  . CARDIOVERSION  01/10/2012   Procedure: CARDIOVERSION;  Surgeon: Marinus Maw, MD;  Location: [facility] ENDOSCOPY;  Service: Cardiovascular;  Laterality: N/A;  . CARDIOVERSION N/A 11/17/2014   Procedure: CARDIOVERSION;  Surgeon:  Laqueta Linden, MD;  Location: [facility]S;  Service: Cardiovascular;  Laterality: N/A;  . RIGHT/LEFT HEART CATH AND CORONARY ANGIOGRAPHY N/A 10/02/2016   Procedure: Right/Left Heart Cath and Coronary Angiography;  Surgeon: Lennette Bihari, MD;  Location: [facility] INVASIVE CV LAB;  Service: Cardiovascular;  Laterality: N/A;  . TEE WITHOUT CARDIOVERSION N/A 11/17/2014   Procedure: TRANSESOPHAGEAL ECHOCARDIOGRAM (TEE);  Surgeon: Laqueta Linden, MD;  Location: [facility];  Service: Cardiovascular;  Laterality: N/A;  . TEE WITHOUT CARDIOVERSION N/A 10/09/2016   Procedure: TRANSESOPHAGEAL ECHOCARDIOGRAM (TEE);  Surgeon: Elease Hashimoto Deloris Ping, MD;  Location: [facility] ENDOSCOPY;  Service: Cardiovascular;  Laterality: N/A;  . TOTAL KNEE ARTHROPLASTY Left 01/16/2013   Procedure: TOTAL KNEE ARTHROPLASTY;  Surgeon: Vickki Hearing, MD;  Location: [facility];  Service: Orthopedics;  Laterality: Left;    Current Medications: Outpatient Medications Prior to Visit  Medication Sig Dispense Refill  . allopurinol (ZYLOPRIM) 300 MG tablet Take 1 tablet (300 mg total) by mouth daily. 30 tablet 5  . apixaban (ELIQUIS) 5 MG TABS tablet Take 1 tablet (5 mg total) by mouth 2 (two) times daily. 60 tablet 3  . famotidine (PEPCID) 20 MG tablet Take 1 tablet (20 mg total) by mouth 2 (two) times daily. 18 tablet 0  . furosemide (LASIX) 40 MG tablet Take 1 tablet by mouth once daily 30 tablet 6  . hydrALAZINE (APRESOLINE) 25 MG tablet Take 0.5 tablets (12.5 mg total) by mouth  2 (two) times daily. 90 tablet 3  . metoprolol succinate (TOPROL-XL) 50 MG 24 hr tablet Take 1 tablet (50 mg total) by mouth daily. Take with or immediately following a meal. 90 tablet 3  . potassium chloride SA (K-DUR) 20 MEQ tablet Take 1 tablet (20 mEq total) by mouth 2 (two) times daily. 90 tablet 1   No facility-administered medications prior to visit.      Allergies:   Azithromycin   Social History   Socioeconomic History  . Marital status: Married    Spouse  name: Not on file  . Number of children: Not on file  . Years of education: Not on file  . Highest education level: Not on file  Occupational History  . Not on file  Social Needs  . Financial resource strain: Not on file  . Food insecurity    Worry: Not on file    Inability: Not on file  . Transportation needs    Medical: Not on file    Non-medical: Not on file  Tobacco Use  . Smoking status: Never Smoker  . Smokeless tobacco: Former Neurosurgeon    Types: Snuff  . Tobacco comment: quit 1980's  Substance and Sexual Activity  . Alcohol use: Yes    Alcohol/week: 14.0 standard drinks    Types: 14 Cans of beer per week    Comment: 1-2 beers daily   . Drug use: Not Currently    Types: Marijuana    Comment: last time 2 years ago  . Sexual activity: Yes    Partners: Male    Birth control/protection: None  Lifestyle  . Physical activity    Days per week: Not on file    Minutes per session: Not on file  . Stress: Not on file  Relationships  . Social Musician on phone: Not on file    Gets together: Not on file    Attends religious service: Not on file    Active member of club or organization: Not on file    Attends meetings of clubs or organizations: Not on file    Relationship status: Not on file  Other Topics Concern  . Not on file  Social History Narrative   Lives in [place] with his sister   Carmela Hurt off from [employer].     Family History:  The patient's ***family history includes Hypertension in his brother, mother, and sister.   Review of Systems:   Please see the history of present illness.     General:  No chills, fever, night sweats or weight changes.  Cardiovascular:  No chest pain, dyspnea on exertion, edema, orthopnea, palpitations, paroxysmal nocturnal dyspnea. Dermatological: No rash, lesions/masses Respiratory: No cough, dyspnea Urologic: No hematuria, dysuria Abdominal:   No nausea, vomiting, diarrhea, bright red blood per rectum, melena, or  hematemesis Neurologic:  No visual changes, wkns, changes in mental status. All other systems reviewed and are otherwise negative except as noted above.   Physical Exam:    VS:  There were no vitals taken for this visit.   General: Well developed, well nourished,male appearing in no acute distress. Head: Normocephalic, atraumatic, sclera non-icteric, no xanthomas, nares are without discharge.  Neck: No carotid bruits. JVD not elevated.  Lungs: Respirations regular and unlabored, without wheezes or rales.  Heart: ***Regular rate and rhythm. No S3 or S4.  No murmur, no rubs, or gallops appreciated. Abdomen: Soft, non-tender, non-distended with normoactive bowel sounds. No hepatomegaly. No rebound/guarding. No obvious abdominal  masses. Msk:  Strength and tone appear normal for age. No joint deformities or effusions. Extremities: No clubbing or cyanosis. No edema.  Distal pedal pulses are 2+ bilaterally. Neuro: Alert and oriented X 3. Moves all extremities spontaneously. No focal deficits noted. Psych:  Responds to questions appropriately with a normal affect. Skin: No rashes or lesions noted  Wt Readings from Last 3 Encounters:  01/26/19 206 lb (93.4 kg)  11/26/18 203 lb (92.1 kg)  10/27/18 203 lb 9.6 oz (92.4 kg)        Studies/Labs Reviewed:   EKG:  EKG is*** ordered today.  The ekg ordered today demonstrates ***  Recent Labs: 03/09/2019: BUN 24; Creat 1.50; Potassium 4.4; Sodium 139   Lipid Panel    Component Value Date/Time   CHOL 104 10/03/2016 0744   TRIG 34 10/03/2016 0744   HDL 30 (L) 10/03/2016 0744   CHOLHDL 3.5 10/03/2016 0744   VLDL 7 10/03/2016 0744   LDLCALC 67 10/03/2016 0744    Additional studies/ records that were reviewed today include:   Echocardiogram: 11/2018 IMPRESSIONS    1. The left ventricle has severely reduced systolic function, with an ejection fraction of 25-30%. The cavity size was normal. There is moderate concentric left ventricular  hypertrophy. Left ventricular diastolic function could not be evaluated  secondary to atrial fibrillation. Left ventricular diffuse hypokinesis.  2. Left atrial size was severely dilated.  3. Right atrial size was moderately dilated.  4. The aortic valve is tricuspid. Moderate thickening of the aortic valve. Mild calcification of the aortic valve. Aortic valve regurgitation is moderate to severe by color flow Doppler. Mild aortic annular calcification noted.  5. The mitral valve is degenerative. Mild thickening of the mitral valve leaflet. There is mild mitral annular calcification present.  6. The tricuspid valve is grossly normal.  7. The aorta is abnormal unless otherwise noted.  8. There is mild to moderate dilatation of the aortic root.  Assessment:    No diagnosis found.   Plan:   In order of problems listed above:  1. ***    Medication Adjustments/Labs and Tests Ordered: Current medicines are reviewed at length with the patient today.  Concerns regarding medicines are outlined above.  Medication changes, Labs and Tests ordered today are listed in the Patient Instructions below. There are no Patient Instructions on file for this visit.   Signed, Erma Heritage, PA-C  03/10/2019 12:21 PM    [facility] [address] [place],  [address] Phone: [phone] Fax: [phone]

## 2019-05-11 ENCOUNTER — Other Ambulatory Visit: Payer: Self-pay | Admitting: Cardiology

## 2019-06-08 ENCOUNTER — Other Ambulatory Visit: Payer: Self-pay | Admitting: Cardiology

## 2019-07-20 ENCOUNTER — Other Ambulatory Visit: Payer: Self-pay

## 2019-07-20 MED ORDER — FUROSEMIDE 40 MG PO TABS: 40.0000 mg | ORAL_TABLET | Freq: Every day | ORAL | 6 refills | Status: DC

## 2019-07-20 NOTE — Telephone Encounter (Signed)
Refilled  lasix 40 mg qd 

## 2019-07-31 ENCOUNTER — Other Ambulatory Visit: Payer: Self-pay | Admitting: Orthopaedic Surgery

## 2019-10-02 ENCOUNTER — Other Ambulatory Visit: Payer: Self-pay | Admitting: Cardiology

## 2019-11-13 ENCOUNTER — Other Ambulatory Visit: Payer: Self-pay | Admitting: Cardiology

## 2019-11-13 ENCOUNTER — Telehealth: Payer: Self-pay

## 2019-11-13 NOTE — Telephone Encounter (Signed)
Patient is going to [place] in [place] and was told to let you know. He stated that if you would send the prescription to them for his Allopurinol he can get it through them for free.    Phone # [phone] Fax # [phone]

## 2019-11-13 NOTE — Telephone Encounter (Signed)
New message     *STAT* If patient is at the pharmacy, call can be transferred to refill team.   1. Which medications need to be refilled? (please list name of each medication and dose if known)  All his prescriptions  2. Which pharmacy/location (including street and city if local pharmacy) is medication to be sent to? [facility] fax [phone]   3. Do they need a 30 day or 90 day supply?  90

## 2019-11-16 MED ORDER — METOPROLOL SUCCINATE ER 50 MG PO TB24: 50.0000 mg | ORAL_TABLET | Freq: Every day | ORAL | 0 refills | Status: DC

## 2019-11-16 MED ORDER — FUROSEMIDE 40 MG PO TABS: 40.0000 mg | ORAL_TABLET | Freq: Every day | ORAL | 0 refills | Status: DC

## 2019-11-16 MED ORDER — HYDRALAZINE HCL 25 MG PO TABS: 12.5000 mg | ORAL_TABLET | Freq: Two times a day (BID) | ORAL | 0 refills | Status: DC

## 2019-11-16 MED ORDER — APIXABAN 5 MG PO TABS: 5.0000 mg | ORAL_TABLET | Freq: Two times a day (BID) | ORAL | 0 refills | Status: DC

## 2019-11-16 MED ORDER — POTASSIUM CHLORIDE CRYS ER 20 MEQ PO TBCR: 20.0000 meq | EXTENDED_RELEASE_TABLET | Freq: Two times a day (BID) | ORAL | 0 refills | Status: DC

## 2019-11-16 NOTE — Telephone Encounter (Signed)
Pt cancelled fu visit 03/2019 and has not rescheduled. He will need to make an apt, will give him a 30 day supply. Front desk staff will attempt to reach patient to r/s apt.   30 day supply faxed to [facility] pharmacy [phone]

## 2019-11-16 NOTE — Telephone Encounter (Signed)
Remind me tomorrow and I will hand write you one to give him

## 2019-11-16 NOTE — Progress Notes (Signed)
Cardiology Office Note    Date:  11/17/2019   ID:  KENTRELL LANGFORD [name], DOB Sep 11, 1960, MRN [id number]  PCP:  Salley Scarlet, MD  Cardiologist: Nona Dell, MD    Chief Complaint  Patient presents with  . Follow-up    Overdue Visit    History of Present Illness:    David [name] is a 59 y.o. male with past medical history of Chronic Systolic CHF/NICM (EF 15-20% by echo in 2018 with cath showing normal cors, EF 25-30% by echo in 11/2018), moderate to severe AI, LAA thrombus (history of this but resolved by echo in 11/2018), HTN, and persistent atrial fibrillation who presents to the office today for overdue follow-up.  He most recently had a telehealth visit with Dr. Diona Browner in 01/2019 denied any recent chest pain or palpitations. He was continued on Toprol-XL, Hydralazine and Lasix given he had acute on chronic renal insufficiency with Lisinopril earlier in the year. By review of notes, he did follow-up with the Coumadin clinic and was transitioned from Coumadin to Eliquis on 02/12/2019. He had an appointment scheduled in 03/2019 but canceled that visit.   In talking with the patient today, he reports overall doing well from a cardiac perspective since his last visit. He denies any recent chest pain or dyspnea on exertion. He does walk to the local store without any anginal symptoms. He did help a friend mow 8 yards during 1 day last week and says he felt fatigued with this but says that is the most active he has been in years. He denies any recent orthopnea, PND or lower extremity edema. No recent palpitations. He reports good compliance with Eliquis and denies any evidence of active bleeding. He is no longer on Hydralazine for unclear reasons. He is now followed by the [facility]    Past Medical History:  Diagnosis Date  . Alcohol abuse   . Arthritis    Bilateral knees   . Atrial fibrillation (HCC)   . Chronic kidney disease, stage 2, mildly decreased GFR   .  GERD (gastroesophageal reflux disease)   . Gout   . Nonischemic cardiomyopathy (HCC)    a. LVEF 20-25% in 2013, improved to 50% on medical therapy. b. 09/2016: echo showing reduced EF of 15-20% and cath showing normal cors.   Marland Kitchen TIA (transient ischemic attack)     Past Surgical History:  Procedure Laterality Date  . Arthroscopic knee surgery Right 1987  . CARDIOVERSION  10/08/2011   Procedure: CARDIOVERSION;  Surgeon: Jonelle Sidle, MD;  Location: [facility] ORS;  Service: Cardiovascular;  Laterality: N/A;  Done in PACU  . CARDIOVERSION  01/10/2012   Procedure: CARDIOVERSION;  Surgeon: Marinus Maw, MD;  Location: [facility] ENDOSCOPY;  Service: Cardiovascular;  Laterality: N/A;  . CARDIOVERSION N/A 11/17/2014   Procedure: CARDIOVERSION;  Surgeon: Laqueta Linden, MD;  Location: [facility] ORS;  Service: Cardiovascular;  Laterality: N/A;  . RIGHT/LEFT HEART CATH AND CORONARY ANGIOGRAPHY N/A 10/02/2016   Procedure: Right/Left Heart Cath and Coronary Angiography;  Surgeon: Lennette Bihari, MD;  Location: [facility] INVASIVE CV LAB;  Service: Cardiovascular;  Laterality: N/A;  . TEE WITHOUT CARDIOVERSION N/A 11/17/2014   Procedure: TRANSESOPHAGEAL ECHOCARDIOGRAM (TEE);  Surgeon: Laqueta Linden, MD;  Location: [facility] ORS;  Service: Cardiovascular;  Laterality: N/A;  . TEE WITHOUT CARDIOVERSION N/A 10/09/2016   Procedure: TRANSESOPHAGEAL ECHOCARDIOGRAM (TEE);  Surgeon: Elease Hashimoto Deloris Ping, MD;  Location: [facility] ENDOSCOPY;  Service: Cardiovascular;  Laterality: N/A;  . TOTAL  KNEE ARTHROPLASTY Left 01/16/2013   Procedure: TOTAL KNEE ARTHROPLASTY;  Surgeon: Vickki Hearing, MD;  Location: [facility] ORS;  Service: Orthopedics;  Laterality: Left;    Current Medications: Outpatient Medications Prior to Visit  Medication Sig Dispense Refill  . allopurinol (ZYLOPRIM) 300 MG tablet Take 1 tablet by mouth once daily 60 tablet 5  . famotidine (PEPCID) 20 MG tablet Take 1 tablet (20 mg total) by mouth 2 (two) times daily. 18 tablet 0  .  apixaban (ELIQUIS) 5 MG TABS tablet Take 1 tablet (5 mg total) by mouth 2 (two) times daily. 60 tablet 0  . furosemide (LASIX) 40 MG tablet Take 1 tablet (40 mg total) by mouth daily. 30 tablet 0  . metoprolol succinate (TOPROL-XL) 50 MG 24 hr tablet Take 1 tablet (50 mg total) by mouth daily. Take with or immediately following a meal. 30 tablet 0  . potassium chloride SA (KLOR-CON) 20 MEQ tablet Take 1 tablet (20 mEq total) by mouth 2 (two) times daily. 60 tablet 0  . hydrALAZINE (APRESOLINE) 25 MG tablet Take 0.5 tablets (12.5 mg total) by mouth 2 (two) times daily. (Patient not taking: Reported on 11/17/2019) 30 tablet 0   No facility-administered medications prior to visit.     Allergies:   Azithromycin   Social History   Socioeconomic History  . Marital status: Married    Spouse name: Not on file  . Number of children: Not on file  . Years of education: Not on file  . Highest education level: Not on file  Occupational History  . Not on file  Tobacco Use  . Smoking status: Never Smoker  . Smokeless tobacco: Former Neurosurgeon    Types: Snuff  . Tobacco comment: quit 1980's  Vaping Use  . Vaping Use: Never used  Substance and Sexual Activity  . Alcohol use: Yes    Alcohol/week: 14.0 standard drinks    Types: 14 Cans of beer per week    Comment: 1-2 beers daily   . Drug use: Not Currently    Types: Marijuana    Comment: last time 2 years ago  . Sexual activity: Yes    Partners: Male    Birth control/protection: None  Other Topics Concern  . Not on file  Social History Narrative   Lives in [place] with his sister   Carmela Hurt off from [employer].   Social Determinants of Health   Financial Resource Strain:   . Difficulty of Paying Living Expenses:   Food Insecurity:   . Worried About Programme researcher, broadcasting/film/video in the Last Year:   . Barista in the Last Year:   Transportation Needs:   . Freight forwarder (Medical):   Marland Kitchen Lack of Transportation (Non-Medical):     Physical Activity:   . Days of Exercise per Week:   . Minutes of Exercise per Session:   Stress:   . Feeling of Stress :   Social Connections:   . Frequency of Communication with Friends and Family:   . Frequency of Social Gatherings with Friends and Family:   . Attends Religious Services:   . Active Member of Clubs or Organizations:   . Attends Banker Meetings:   Marland Kitchen Marital Status:      Family History:  The patient's family history includes Hypertension in his brother, mother, and sister.   Review of Systems:   Please see the history of present illness.     General:  No chills, fever,  night sweats or weight changes. Positive for fatigue.  Cardiovascular:  No chest pain, dyspnea on exertion, edema, orthopnea, palpitations, paroxysmal nocturnal dyspnea. Dermatological: No rash, lesions/masses Respiratory: No cough, dyspnea Urologic: No hematuria, dysuria Abdominal:   No nausea, vomiting, diarrhea, bright red blood per rectum, melena, or hematemesis Neurologic:  No visual changes, wkns, changes in mental status. All other systems reviewed and are otherwise negative except as noted above.   Physical Exam:    VS:  BP 114/72   Pulse (!) 55   Ht 5\' 11"  (1.803 m)   Wt 213 lb 12.8 oz (97 kg)   SpO2 97%   BMI 29.82 kg/m    General: Well developed, well nourished,male appearing in no acute distress. Head: Normocephalic, atraumatic. Neck: No carotid bruits. JVD not elevated.  Lungs: Respirations regular and unlabored, without wheezes or rales.  Heart: Irregularly irregular. No S3 or S4. Diastolic murmur along RUSB.   No rubs or gallops appreciated. Abdomen: Appears non-distended. No obvious abdominal masses. Msk:  Strength and tone appear normal for age. No obvious joint deformities or effusions. Extremities: No clubbing or cyanosis. No lower extremity edema.  Distal pedal pulses are 2+ bilaterally. Neuro: Alert and oriented X 3. Moves all extremities spontaneously.  No focal deficits noted. Psych:  Responds to questions appropriately with a normal affect. Skin: No rashes or lesions noted  Wt Readings from Last 3 Encounters:  11/17/19 213 lb 12.8 oz (97 kg)  01/26/19 206 lb (93.4 kg)  11/26/18 203 lb (92.1 kg)     Studies/Labs Reviewed:   EKG:  EKG is ordered today.  The ekg ordered today demonstrates atrial fibrillation with slow-ventricular response, HR 55. LVH with repol. No acute ST changes when compared to prior tracings.   Recent Labs: 03/09/2019: BUN 24; Creat 1.50; Potassium 4.4; Sodium 139   Lipid Panel    Component Value Date/Time   CHOL 104 10/03/2016 0744   TRIG 34 10/03/2016 0744   HDL 30 (L) 10/03/2016 0744   CHOLHDL 3.5 10/03/2016 0744   VLDL 7 10/03/2016 0744   LDLCALC 67 10/03/2016 0744    Additional studies/ records that were reviewed today include:   Cardiac Catheterization: 09/2016 Echo documentation of ejection fraction of 15-20%.  Dilated left ventricle with an LVEDP of 27 mm.  Moderate right heart pressure elevation with moderate pulmonary hypertension.  Normal epicardial coronary arteries.  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  RECOMMENDATION: Aggressive medical therapy for a nonischemic dilated cardiomyopathy.  Consider ARB therapy with future transition to entresto, spironolactone,  carvedilol, and consideration for initial LifeVest with possible need for future prophylactic ICD implantation.   Echocardiogram: 11/2018 IMPRESSIONS    1. The left ventricle has severely reduced systolic function, with an  ejection fraction of 25-30%. The cavity size was normal. There is moderate  concentric left ventricular hypertrophy. Left ventricular diastolic  function could not be evaluated  secondary to atrial fibrillation. Left ventricular diffuse hypokinesis.  2. Left atrial size was severely dilated.  3. Right atrial size was moderately dilated.  4. The aortic valve is  tricuspid. Moderate thickening of the aortic  valve. Mild calcification of the aortic valve. Aortic valve regurgitation  is moderate to severe by color flow Doppler. Mild aortic annular  calcification noted.  5. The mitral valve is degenerative. Mild thickening of the mitral valve  leaflet. There is mild mitral annular calcification present.  6. The tricuspid valve is grossly normal.  7. The aorta is abnormal unless otherwise  noted.  8. There is mild to moderate dilatation of the aortic root.    Assessment:    1. Chronic systolic (congestive) heart failure (HCC)   2. Persistent atrial fibrillation (HCC)   3. Aortic valve insufficiency, etiology of cardiac valve disease unspecified   4. Essential hypertension   5. Stage 3b chronic kidney disease      Plan:   In order of problems listed above:  1. Chronic Systolic CHF - His EF was previously 15-20% by echo in 2018 with cath showing normal cors. EF was improved to 25-30% by echo in 11/2018. He denies any recent dyspnea on exertion, orthopnea or PND.  - Will plan to obtain a follow-up echocardiogram for reassessment of his EF. Continue Toprol-XL 50mg  daily and Lasix 40mg  daily. Previously intolerant to ACE-I given AKI. Consider restarting low-dose Hydralazine pending repeat echo if EF remains reduced. Will also request a copy of recent labs from the as Spironolactone could be an option as well.  - Pending repeat echo and compliance with follow-up, EP referral to discuss ICD placement may be warranted.   2. Persistent Atrial Fibrillation - He denies any recent palpitations and HR is well-controlled in the 50's today. Continue Toprol-XL 50mg  daily for rate-control.  - He remains on Eliquis 5mg  BID for anticoagulation. Denies any evidence of active bleeding. Will request a copy of recent labs.   3. Aortic Regurgitation - This was moderate to severe by echocardiogram in 11/2018. Will plan for a repeat echocardiogram as outlined  above.   4. HTN - BP is well-controlled at 114/72 during today's visit. Continue current medication regimen.   5. Stage 3 CKD - Baseline creatinine 1.4 - 1.5. Stable at 1.50 by labs in 02/2019. Will request a copy of his most recent labs from the [facility] .    Medication Adjustments/Labs and Tests Ordered: Current medicines are reviewed at length with the patient today.  Concerns regarding medicines are outlined above.  Medication changes, Labs and Tests ordered today are listed in the Patient Instructions below. Patient Instructions  Medication Instructions:  Your physician recommends that you continue on your current medications as directed. Please refer to the Current Medication list given to you today.  *If you need a refill on your cardiac medications before your next appointment, please call your pharmacy*   Lab Work: NONE   If you have labs (blood work) drawn today and your tests are completely normal, you will receive your results only by: 12/2018 MyChart Message (if you have MyChart) OR . A paper copy in the mail If you have any lab test that is abnormal or we need to change your treatment, we will call you to review the results.   Testing/Procedures: Your physician has requested that you have an echocardiogram. Echocardiography is a painless test that uses sound waves to create images of your heart. It provides your doctor with information about the size and shape of your heart and how well your heart's chambers and valves are working. This procedure takes approximately one hour. There are no restrictions for this procedure.  Follow-Up: At [facility], you and your health needs are our priority.  As part of our continuing mission to provide you with exceptional heart care, we have created designated Provider Care Teams.  These Care Teams include your primary Cardiologist (physician) and Advanced Practice Providers (APPs -  Physician Assistants and Nurse Practitioners) who  all work together to provide you with the care you need, when you need  it.  We recommend signing up for the patient portal called "MyChart".  Sign up information is provided on this After Visit Summary.  MyChart is used to connect with patients for Virtual Visits (Telemedicine).  Patients are able to view lab/test results, encounter notes, upcoming appointments, etc.  Non-urgent messages can be sent to your provider as well.   To learn more about what you can do with MyChart, go to [email or web address].    Your next appointment:   6 month(s)  The format for your next appointment:   In Person  Provider:   Nona Dell, MD or Randall An, PA-C   Other Instructions Thank you for choosing [facility]!     Signed, Ellsworth Lennox, PA-C  11/17/2019 7:09 PM    [facility] [address] [place], [place] [address] Phone: [phone] Fax: [phone]

## 2019-11-17 ENCOUNTER — Ambulatory Visit (INDEPENDENT_AMBULATORY_CARE_PROVIDER_SITE_OTHER): Payer: Medicaid Other | Admitting: Student

## 2019-11-17 ENCOUNTER — Encounter: Payer: Self-pay | Admitting: Student

## 2019-11-17 ENCOUNTER — Other Ambulatory Visit: Payer: Self-pay

## 2019-11-17 VITALS — BP 114/72 | HR 55 | Ht 71.0 in | Wt 213.8 lb

## 2019-11-17 DIAGNOSIS — I10 Essential (primary) hypertension: Secondary | ICD-10-CM | POA: Diagnosis not present

## 2019-11-17 DIAGNOSIS — I4819 Other persistent atrial fibrillation: Secondary | ICD-10-CM | POA: Diagnosis not present

## 2019-11-17 DIAGNOSIS — I5022 Chronic systolic (congestive) heart failure: Secondary | ICD-10-CM | POA: Diagnosis not present

## 2019-11-17 DIAGNOSIS — I351 Nonrheumatic aortic (valve) insufficiency: Secondary | ICD-10-CM | POA: Diagnosis not present

## 2019-11-17 DIAGNOSIS — N1832 Chronic kidney disease, stage 3b: Secondary | ICD-10-CM

## 2019-11-17 MED ORDER — POTASSIUM CHLORIDE CRYS ER 20 MEQ PO TBCR: 20.0000 meq | EXTENDED_RELEASE_TABLET | Freq: Two times a day (BID) | ORAL | 3 refills | Status: DC

## 2019-11-17 MED ORDER — APIXABAN 5 MG PO TABS: 5.0000 mg | ORAL_TABLET | Freq: Two times a day (BID) | ORAL | 3 refills | Status: AC

## 2019-11-17 MED ORDER — METOPROLOL SUCCINATE ER 50 MG PO TB24: 50.0000 mg | ORAL_TABLET | Freq: Every day | ORAL | 3 refills | Status: DC

## 2019-11-17 MED ORDER — FUROSEMIDE 40 MG PO TABS: 40.0000 mg | ORAL_TABLET | Freq: Every day | ORAL | 3 refills | Status: DC

## 2019-11-17 NOTE — Patient Instructions (Signed)
Medication Instructions:  Your physician recommends that you continue on your current medications as directed. Please refer to the Current Medication list given to you today.  *If you need a refill on your cardiac medications before your next appointment, please call your pharmacy*   Lab Work: NONE  If you have labs (blood work) drawn today and your tests are completely normal, you will receive your results only by: MyChart Message (if you have MyChart) OR A paper copy in the mail If you have any lab test that is abnormal or we need to change your treatment, we will call you to review the results.   Testing/Procedures: Your physician has requested that you have an echocardiogram. Echocardiography is a painless test that uses sound waves to create images of your heart. It provides your doctor with information about the size and shape of your heart and how well your heart's chambers and valves are working. This procedure takes approximately one hour. There are no restrictions for this procedure.    Follow-Up: At [facility], you and your health needs are our priority.  As part of our continuing mission to provide you with exceptional heart care, we have created designated Provider Care Teams.  These Care Teams include your primary Cardiologist (physician) and Advanced Practice Providers (APPs -  Physician Assistants and Nurse Practitioners) who all work together to provide you with the care you need, when you need it.  We recommend signing up for the patient portal called "MyChart".  Sign up information is provided on this After Visit Summary.  MyChart is used to connect with patients for Virtual Visits (Telemedicine).  Patients are able to view lab/test results, encounter notes, upcoming appointments, etc.  Non-urgent messages can be sent to your provider as well.   To learn more about what you can do with MyChart, go to [email or web address].com.    Your next appointment:   6  month(s)  The format for your next appointment:   In Person  Provider:   Samuel McDowell, MD or Brittany Strader, PA-C   Other Instructions Thank you for choosing [facility]!    

## 2019-11-18 ENCOUNTER — Telehealth: Payer: Self-pay

## 2019-11-18 NOTE — Telephone Encounter (Signed)
Per Okey Regal, patient did pick up prescription yesterday.

## 2019-11-18 NOTE — Telephone Encounter (Signed)
Dr.Keeling wrote prescription for patient to take to [place].  I called and told him that he would have to come by office and pick it up to carry it to the [place] in [place].

## 2019-11-27 ENCOUNTER — Ambulatory Visit (HOSPITAL_COMMUNITY)
Admission: RE | Admit: 2019-11-27 | Discharge: 2019-11-27 | Disposition: A | Discharge status: Home / Self Care | Payer: Medicaid Other | Source: Ambulatory Visit | Attending: Family Medicine | Admitting: Family Medicine

## 2019-11-27 DIAGNOSIS — I5022 Chronic systolic (congestive) heart failure: Secondary | ICD-10-CM | POA: Diagnosis present

## 2019-11-27 LAB — ECHOCARDIOGRAM COMPLETE
Area-P 1/2: 2.83 cm2
Calc EF: 40.2 %
P 1/2 time: 735 msec
S' Lateral: 4.91 cm
Single Plane A2C EF: 36.3 %
Single Plane A4C EF: 43.8 %

## 2019-11-27 NOTE — Progress Notes (Signed)
*  PRELIMINARY RESULTS* Echocardiogram 2D Echocardiogram has been performed.  Stacey Drain 11/27/2019, 9:23 AM

## 2019-12-03 ENCOUNTER — Telehealth: Payer: Self-pay | Admitting: *Deleted

## 2019-12-03 MED ORDER — HYDRALAZINE HCL 25 MG PO TABS
12.5000 mg | ORAL_TABLET | Freq: Two times a day (BID) | ORAL | 11 refills | Status: DC
Start: 2019-12-03 — End: 2020-01-28

## 2019-12-03 NOTE — Telephone Encounter (Signed)
-----   Message from Ellsworth Lennox, [place] sent at 11/28/2019  7:46 AM EDT ----- Please let the patient know the pumping function of his heart has improved over the past year but still remains weak. His EF was previously 15-20% by echo in 2018 and 25-30% by echo in 11/2018. Now at 35% and normal is 55-65%. He does have mild leakage along the mitral valve and moderate leakage along the aortic valve which we will continue to follow. For his cardiomyopathy, would recommend continuing Toprol-XL and would restart Hydralazine 12.5mg  BID (was on this in the past but not taking at the time of his last visit). He was initially going to follow-up in 6 months but would recommend closer follow-up with myself or Dr. Diona Browner in 6-8 weeks for further medication titration.

## 2019-12-03 NOTE — Telephone Encounter (Signed)
Pt notified, order placed.

## 2019-12-04 ENCOUNTER — Ambulatory Visit: Payer: Medicaid Other | Admitting: Cardiology

## 2019-12-30 ENCOUNTER — Telehealth: Payer: Self-pay | Admitting: Orthopedic Surgery

## 2019-12-30 NOTE — Telephone Encounter (Signed)
Patient came to office with form for his disability parking placard to be completed by Dr Romeo Apple (in box for review and signing).

## 2020-01-17 ENCOUNTER — Other Ambulatory Visit: Payer: Self-pay | Admitting: Cardiology

## 2020-01-28 ENCOUNTER — Other Ambulatory Visit: Payer: Self-pay

## 2020-01-28 ENCOUNTER — Ambulatory Visit (INDEPENDENT_AMBULATORY_CARE_PROVIDER_SITE_OTHER): Payer: Medicaid Other | Admitting: Student

## 2020-01-28 ENCOUNTER — Encounter: Payer: Self-pay | Admitting: Student

## 2020-01-28 VITALS — BP 148/90 | HR 68 | Ht 71.0 in | Wt 232.0 lb

## 2020-01-28 DIAGNOSIS — I351 Nonrheumatic aortic (valve) insufficiency: Secondary | ICD-10-CM

## 2020-01-28 DIAGNOSIS — I10 Essential (primary) hypertension: Secondary | ICD-10-CM

## 2020-01-28 DIAGNOSIS — Z79899 Other long term (current) drug therapy: Secondary | ICD-10-CM

## 2020-01-28 DIAGNOSIS — N1832 Chronic kidney disease, stage 3b: Secondary | ICD-10-CM

## 2020-01-28 DIAGNOSIS — I4819 Other persistent atrial fibrillation: Secondary | ICD-10-CM | POA: Diagnosis not present

## 2020-01-28 DIAGNOSIS — I5022 Chronic systolic (congestive) heart failure: Secondary | ICD-10-CM | POA: Diagnosis not present

## 2020-01-28 MED ORDER — POTASSIUM CHLORIDE CRYS ER 20 MEQ PO TBCR: 20.0000 meq | EXTENDED_RELEASE_TABLET | Freq: Two times a day (BID) | ORAL | 3 refills | Status: AC

## 2020-01-28 MED ORDER — HYDRALAZINE HCL 25 MG PO TABS: 25.0000 mg | ORAL_TABLET | Freq: Two times a day (BID) | ORAL | 3 refills | Status: DC

## 2020-01-28 MED ORDER — FUROSEMIDE 40 MG PO TABS
40.0000 mg | ORAL_TABLET | Freq: Two times a day (BID) | ORAL | 11 refills | Status: DC
Start: 2020-01-28 — End: 2020-04-04

## 2020-01-28 NOTE — Progress Notes (Signed)
Cardiology Office Note    Date:  01/28/2020   ID:  SHERWOOD CASTILLA [name], DOB [birth year], MRN [id number]  PCP:  Salley Scarlet, MD  Cardiologist: Nona Dell, MD    Chief Complaint  Patient presents with  . Follow-up    2 month visit    History of Present Illness:    David [name] is a 59 y.o. male with past medical history ofchronicsystolic CHF/NICM(EF 15-20% by echo in 2018 with cath showing normal cors, EF 25-30% by echo in 11/2018), moderate to severe AI, LAA thrombus (history of this but resolved by echo in 11/2018), persistent atrial fibrillation, HTN and Stage 3 CKD who presents to the office today for 72-month follow-up.  He was last examined by myself in 11/2019 and denied any recent chest pain or dyspnea at the time of his visit. He was taking Toprol-XL 50mg  daily and Lasix 40mg  daily. Previously was on Hydralazine but unclear why this had been discontinued. A repeat echocardiogram was recommended for reassessment with plans to restart Hydralazine if EF remained reduced as he was previously intolerant to ACE-I due to an AKI and was therefore not on ARB, ARNI or Aldactone. Repeat echo did show his EF had improved to 35% with global HK, moderate LVH, mild MR and moderate AI. It was recommended he restart Hydralazine 12.5mg  BID with plans for further medication titration if BP allowed.   In talking with the patient today, he reports having gained over 20 pounds since his last office visit. He is unaware of any specific dietary indiscretions but says he did go on vacation to the beach for one week.  He has noticed worsening abdominal distention but denies any changes in his respiratory status. No recent dyspnea on exertion, orthopnea, PND or lower extreme edema. No recent chest pain or palpitations.  He has been taking Hydralazine 25 mg as a once daily dose instead of 12.5 mg twice daily due to being unable to cut the pills.   Past Medical History:  Diagnosis Date    . Alcohol abuse   . Arthritis    Bilateral knees   . Atrial fibrillation (HCC)   . Chronic kidney disease, stage 2, mildly decreased GFR   . GERD (gastroesophageal reflux disease)   . Gout   . Nonischemic cardiomyopathy (HCC)    a. LVEF 20-25% in 2013, improved to 50% on medical therapy. b. 09/2016: echo showing reduced EF of 15-20% and cath showing normal cors.   2014 TIA (transient ischemic attack)     Past Surgical History:  Procedure Laterality Date  . Arthroscopic knee surgery Right 1987  . CARDIOVERSION  10/08/2011   Procedure: CARDIOVERSION;  Surgeon: Marland Kitchen, MD;  Location: [facility] ORS;  Service: Cardiovascular;  Laterality: N/A;  Done in PACU  . CARDIOVERSION  01/10/2012   Procedure: CARDIOVERSION;  Surgeon: Jonelle Sidle, MD;  Location: [facility] ENDOSCOPY;  Service: Cardiovascular;  Laterality: N/A;  . CARDIOVERSION N/A 11/17/2014   Procedure: CARDIOVERSION;  Surgeon: [facility], MD;  Location: [facility] ORS;  Service: Cardiovascular;  Laterality: N/A;  . RIGHT/LEFT HEART CATH AND CORONARY ANGIOGRAPHY N/A 10/02/2016   Procedure: Right/Left Heart Cath and Coronary Angiography;  Surgeon: Laqueta Linden, MD;  Location: [facility] INVASIVE CV LAB;  Service: Cardiovascular;  Laterality: N/A;  . TEE WITHOUT CARDIOVERSION N/A 11/17/2014   Procedure: TRANSESOPHAGEAL ECHOCARDIOGRAM (TEE);  Surgeon: Lennette Bihari, MD;  Location: [facility] ORS;  Service: Cardiovascular;  Laterality: N/A;  . TEE WITHOUT  CARDIOVERSION N/A 10/09/2016   Procedure: TRANSESOPHAGEAL ECHOCARDIOGRAM (TEE);  Surgeon: Elease Hashimoto Deloris Ping, MD;  Location: [facility] ENDOSCOPY;  Service: Cardiovascular;  Laterality: N/A;  . TOTAL KNEE ARTHROPLASTY Left 01/16/2013   Procedure: TOTAL KNEE ARTHROPLASTY;  Surgeon: Vickki Hearing, MD;  Location: [facility]S;  Service: Orthopedics;  Laterality: Left;    Current Medications: Outpatient Medications Prior to Visit  Medication Sig Dispense Refill  . allopurinol (ZYLOPRIM) 300 MG tablet Take 1 tablet  by mouth once daily 60 tablet 5  . apixaban (ELIQUIS) 5 MG TABS tablet Take 1 tablet (5 mg total) by mouth 2 (two) times daily. 180 tablet 3  . famotidine (PEPCID) 20 MG tablet Take 1 tablet (20 mg total) by mouth 2 (two) times daily. 18 tablet 0  . metoprolol succinate (TOPROL-XL) 50 MG 24 hr tablet TAKE 1 TABLET BY MOUTH ONCE DAILY WITH  OR  IMMEDIATELY  FOLLOWING  A  MEAL 90 tablet 3  . potassium chloride (KLOR-CON) 20 MEQ packet Take 20 mEq by mouth daily.    . furosemide (LASIX) 40 MG tablet Take 1 tablet (40 mg total) by mouth daily. 90 tablet 3  . hydrALAZINE (APRESOLINE) 25 MG tablet Take 25 mg by mouth daily.    . hydrALAZINE (APRESOLINE) 25 MG tablet Take 0.5 tablets (12.5 mg total) by mouth in the morning and at bedtime. 30 tablet 11  . potassium chloride SA (KLOR-CON) 20 MEQ tablet Take 1 tablet (20 mEq total) by mouth 2 (two) times daily. 180 tablet 3   No facility-administered medications prior to visit.     Allergies:   Azithromycin   Social History   Socioeconomic History  . Marital status: Married    Spouse name: Not on file  . Number of children: Not on file  . Years of education: Not on file  . Highest education level: Not on file  Occupational History  . Not on file  Tobacco Use  . Smoking status: Never Smoker  . Smokeless tobacco: Former Neurosurgeon    Types: Snuff  . Tobacco comment: quit 1980's  Vaping Use  . Vaping Use: Never used  Substance and Sexual Activity  . Alcohol use: Yes    Alcohol/week: 14.0 standard drinks    Types: 14 Cans of beer per week    Comment: 1-2 beers daily   . Drug use: Not Currently    Types: Marijuana    Comment: last time 2 years ago  . Sexual activity: Yes    Partners: Male    Birth control/protection: None  Other Topics Concern  . Not on file  Social History Narrative   Lives in [place] with his sister   Carmela Hurt off from [employer].   Social Determinants of Health   Financial Resource Strain:   . Difficulty of Paying  Living Expenses: Not on file  Food Insecurity:   . Worried About Programme researcher, broadcasting/film/video in the Last Year: Not on file  . Ran Out of Food in the Last Year: Not on file  Transportation Needs:   . Lack of Transportation (Medical): Not on file  . Lack of Transportation (Non-Medical): Not on file  Physical Activity:   . Days of Exercise per Week: Not on file  . Minutes of Exercise per Session: Not on file  Stress:   . Feeling of Stress : Not on file  Social Connections:   . Frequency of Communication with Friends and Family: Not on file  . Frequency of Social Gatherings with  Friends and Family: Not on file  . Attends Religious Services: Not on file  . Active Member of Clubs or Organizations: Not on file  . Attends Banker Meetings: Not on file  . Marital Status: Not on file     Family History:  The patient's family history includes Hypertension in his brother, mother, and sister.   Review of Systems:   Please see the history of present illness.     General:  No chills, fever, or night sweats. Positive for weight gain.  Cardiovascular:  No chest pain, dyspnea on exertion, edema, orthopnea, palpitations, paroxysmal nocturnal dyspnea. Dermatological: No rash, lesions/masses Respiratory: No cough, dyspnea Urologic: No hematuria, dysuria Abdominal:   No nausea, vomiting, diarrhea, bright red blood per rectum, melena, or hematemesis. Positive for abdominal bloating.  Neurologic:  No visual changes, wkns, changes in mental status. All other systems reviewed and are otherwise negative except as noted above.   Physical Exam:    VS:  BP (!) 148/90   Pulse 68   Ht 5\' 11"  (1.803 m)   Wt 232 lb (105.2 kg)   SpO2 97%   BMI 32.36 kg/m    General: Well developed, well nourished,male appearing in no acute distress. Head: Normocephalic, atraumatic. Neck: No carotid bruits. JVD not elevated.  Lungs: Respirations regular and unlabored, without wheezes or rales.  Heart: Irregularly  irregular. No S3 or S4.  2/6 diastolic murmur along RUSB.  Abdomen: Appears distended. No obvious abdominal masses. Msk:  Strength and tone appear normal for age. No obvious joint deformities or effusions. Extremities: No clubbing or cyanosis. Trace lower extremity edema.  Distal pedal pulses are 2+ bilaterally. Neuro: Alert and oriented X 3. Moves all extremities spontaneously. No focal deficits noted. Psych:  Responds to questions appropriately with a normal affect. Skin: No rashes or lesions noted  Wt Readings from Last 3 Encounters:  01/28/20 232 lb (105.2 kg)  11/17/19 213 lb 12.8 oz (97 kg)  01/26/19 206 lb (93.4 kg)     Studies/Labs Reviewed:   EKG:  EKG is not ordered today.   Recent Labs: 03/09/2019: BUN 24; Creat 1.50; Potassium 4.4; Sodium 139   Lipid Panel    Component Value Date/Time   CHOL 104 10/03/2016 0744   TRIG 34 10/03/2016 0744   HDL 30 (L) 10/03/2016 0744   CHOLHDL 3.5 10/03/2016 0744   VLDL 7 10/03/2016 0744   LDLCALC 67 10/03/2016 0744    Additional studies/ records that were reviewed today include:   Echocardiogram: 11/2019 IMPRESSIONS    1. Left ventricular ejection fraction, by estimation, is approximately  35%. The left ventricle has moderately decreased function. The left  ventricle demonstrates global hypokinesis. The left ventricular internal  cavity size was mildly dilated. There is  moderate left ventricular hypertrophy. Left ventricular diastolic  parameters are indeterminate in the setting of atrial fibrillation.  2. Right ventricular systolic function is mildly reduced. The right  ventricular size is normal. Tricuspid regurgitation signal is inadequate  for assessing PA pressure.  3. Left atrial size was moderately dilated.  4. The mitral valve is grossly normal. Mild mitral valve regurgitation.  5. The aortic valve is bicuspid, mildly calcified. Aortic valve  regurgitation is moderate. Vena contracta 0.6 cm.  6. Aortic  dilatation noted. There is moderate dilatation of the aortic  root.  7. The inferior vena cava is normal in size with greater than 50%  respiratory variability, suggesting right atrial pressure of 3 mmHg.   Assessment:  1. Chronic systolic (congestive) heart failure (HCC)   2. Medication management   3. Persistent atrial fibrillation (HCC)   4. Aortic valve insufficiency, etiology of cardiac valve disease unspecified   5. Essential hypertension   6. Stage 3b chronic kidney disease (HCC)      Plan:   In order of problems listed above:  1. Chronic Systolic CHF/NICM - His EF was previously 15 to 20% in 2018 with catheterization showing normal cors. Most recent echocardiogram showed his EF had improved to 35% in the setting of being compliant with medical therapy. He has remained on Toprol-XL 50 mg daily and Hydralazine was added following his echocardiogram results. Given that he is only taking this once daily, I recommended increasing his Hydralazine to 25 mg twice daily. If blood pressure remains stable, would anticipate switching to BiDil at the time of follow-up. He is not on an ACE-I/ARB/ARNI or Spironolactone due to variable renal function and history of significant AKI with reinitiation of ACE-I in the past. Would not refer to EP for consideration of ICD at this time as he is now demonstrating compliance with medications and his EF continues to improve. Could reconsider based off repeat imaging in the future. - He does have abdominal distention on examination today and weight has increased by approximately 20 pounds since his last visit 2 months ago. I did recommend he increase Lasix to 40 mg twice daily and K-dur to 20 mEq BID. Repeat BMET in 2 weeks.   2. Persistent Atrial Fibrillation - He denies any recent palpitations and heart rate is well controlled in the 60's during today's visit.  Continue Toprol-XL 50 mg daily for rate control. - He denies any evidence of active bleeding.  Remains on Eliquis 5 mg twice daily for anticoagulation. Will request most recent labs from the [place].  3. Aortic Regurgitation - This was moderate by most recent echocardiogram in 11/2019. Will continue to follow.  4. HTN - BP was elevated at 148/90 during today's visit. Will increase Hydralazine from 25 mg daily to 25 mg twice daily and anticipate switching to BiDil at his next visit if BP allows.  5. Stage 3 CKD - Baseline creatinine 1.5  - 1.6. At 1.50 in 02/2019. Will obtain a repeat BMET in 2 weeks following titration of diuretic therapy.     Medication Adjustments/Labs and Tests Ordered: Current medicines are reviewed at length with the patient today.  Concerns regarding medicines are outlined above.  Medication changes, Labs and Tests ordered today are listed in the Patient Instructions below. Patient Instructions  Medication Instructions:  Your physician has recommended you make the following change in your medication:   Increase Lasix to 40 mg Two Times Daily  Increase Potassium to 20 Meq Two Times Daily  Increase Hydralazine to 25 mg Two Times Daily   *If you need a refill on your cardiac medications before your next appointment, please call your pharmacy*   Lab Work: Your physician recommends that you return for lab work in: 2 Weeks (02/11/20)  If you have labs (blood work) drawn today and your tests are completely normal, you will receive your results only by: Marland Kitchen MyChart Message (if you have MyChart) OR . A paper copy in the mail If you have any lab test that is abnormal or we need to change your treatment, we will call you to review the results.   Testing/Procedures: NONE   Follow-Up: At [facility], you and your health needs are our priority.  As  part of our continuing mission to provide you with exceptional heart care, we have created designated Provider Care Teams.  These Care Teams include your primary Cardiologist (physician) and Advanced Practice Providers  (APPs -  Physician Assistants and Nurse Practitioners) who all work together to provide you with the care you need, when you need it.  We recommend signing up for the patient portal called "MyChart".  Sign up information is provided on this After Visit Summary.  MyChart is used to connect with patients for Virtual Visits (Telemedicine).  Patients are able to view lab/test results, encounter notes, upcoming appointments, etc.  Non-urgent messages can be sent to your provider as well.   To learn more about what you can do with MyChart, go to [email or web address].    Your next appointment:   4-6 week(s)  The format for your next appointment:   In Person  Provider:   Randall An, PA-C   Other Instructions Thank you for choosing [facility]!       Signed, Ellsworth Lennox, PA-C  01/28/2020 4:39 PM    [facility] [address] [place], [place] [address] Phone: [phone] Fax: [phone]

## 2020-01-28 NOTE — Patient Instructions (Addendum)
Medication Instructions:  Your physician has recommended you make the following change in your medication:   Increase Lasix to 40 mg Two Times Daily  Increase Potassium to 20 Meq Two Times Daily  Increase Hydralazine to 25 mg Two Times Daily   *If you need a refill on your cardiac medications before your next appointment, please call your pharmacy*   Lab Work: Your physician recommends that you return for lab work in: 2 Weeks (02/11/20)  If you have labs (blood work) drawn today and your tests are completely normal, you will receive your results only by: Marland Kitchen MyChart Message (if you have MyChart) OR . A paper copy in the mail If you have any lab test that is abnormal or we need to change your treatment, we will call you to review the results.   Testing/Procedures: NONE   Follow-Up: At [facility], you and your health needs are our priority.  As part of our continuing mission to provide you with exceptional heart care, we have created designated Provider Care Teams.  These Care Teams include your primary Cardiologist (physician) and Advanced Practice Providers (APPs -  Physician Assistants and Nurse Practitioners) who all work together to provide you with the care you need, when you need it.  We recommend signing up for the patient portal called "MyChart".  Sign up information is provided on this After Visit Summary.  MyChart is used to connect with patients for Virtual Visits (Telemedicine).  Patients are able to view lab/test results, encounter notes, upcoming appointments, etc.  Non-urgent messages can be sent to your provider as well.   To learn more about what you can do with MyChart, go to [email or web address].    Your next appointment:   4-6 week(s)  The format for your next appointment:   In Person  Provider:   Randall An, PA-C   Other Instructions Thank you for choosing [facility]!

## 2020-02-12 ENCOUNTER — Telehealth: Payer: Self-pay

## 2020-02-12 LAB — BASIC METABOLIC PANEL
BUN/Creatinine Ratio: 13 (calc) (ref 6–22)
BUN: 19 mg/dL (ref 7–25)
CO2: 26 mmol/L (ref 20–32)
Calcium: 10.2 mg/dL (ref 8.6–10.3)
Chloride: 96 mmol/L — ABNORMAL LOW (ref 98–110)
Creat: 1.45 mg/dL — ABNORMAL HIGH (ref 0.70–1.33)
Glucose, Bld: 149 mg/dL — ABNORMAL HIGH (ref 65–139)
Potassium: 4.4 mmol/L (ref 3.5–5.3)
Sodium: 137 mmol/L (ref 135–146)

## 2020-02-12 NOTE — Telephone Encounter (Signed)
-----   Message from Ellsworth Lennox, [place] sent at 02/12/2020  9:03 AM EDT ----- Please let the patient know his kidney function remains stable following recent adjustment of his diuretic. Electrolytes within a normal range. Glucose was elevated but expected given it was a non-fasting sample. Please follow-up with the patient to see if his weight has declined sine making the medication adjustments?

## 2020-02-12 NOTE — Telephone Encounter (Signed)
Lab results given to patient.his weight over past 2 weeks has gone from 232 lbs to 220 lbs. States he feels good.     I will FYI B.Iran Ouch, PA-C

## 2020-02-12 NOTE — Telephone Encounter (Signed)
    Fantastic! Thanks for the update.   Signed, Ellsworth Lennox, PA-C 02/12/2020, 4:25 PM

## 2020-03-11 ENCOUNTER — Ambulatory Visit: Payer: Medicaid Other | Admitting: Student

## 2020-03-11 NOTE — Progress Notes (Deleted)
Cardiology Office Note    Date:  03/11/2020   ID:  David [name], DOB 1960/05/06, MRN [id number]  PCP:  Salley Scarlet, MD  Cardiologist: Nona Dell, MD    No chief complaint on file.   History of Present Illness:    David [name] is a 59 y.o. male with past medical history ofchronicsystolic CHF/NICM(EF 15-20% by echo in 2018 with cath showing normal cors, EF 25-30% by echo in 11/2018 and 35% in 11/2019), moderate AI, LAA thrombus (history of thisbut resolved by repeat imaging), persistent atrial fibrillation, HTN and Stage 3 CKD who presents to the office today for 6-week follow-up.   He was last examined by myself in 01/2020 and reported having gained over 20 lbs within the past 2 months with associated abdominal distension. Denied any changes in his breathing. Lasix was increased from 40mg  daily to 40mg  BID and Hydralazine was increased from 25mg  daily to 25mg  BID for his cardiomyopathy. He was not on an ACE-I/ARB/ARNI given an AKI with an ACE-I in the past and due to his variable renal function.   Repeat labs on 02/11/2020 showed creatinine was stable at 1.45 and K+ was at 4.4. He reported his weight had already declined by 12 lbs and he was continued on his current regimen.   BiDIl 20-37.5 TID?  Past Medical History:  Diagnosis Date  . Alcohol abuse   . Arthritis    Bilateral knees   . Atrial fibrillation (HCC)   . Chronic kidney disease, stage 2, mildly decreased GFR   . GERD (gastroesophageal reflux disease)   . Gout   . Nonischemic cardiomyopathy (HCC)    a. LVEF 20-25% in 2013, improved to 50% on medical therapy. b. 09/2016: echo showing reduced EF of 15-20% and cath showing normal cors.   TIA (transient ischemic attack)     Past Surgical History:  Procedure Laterality Date  . Arthroscopic knee surgery Right 1987  . CARDIOVERSION  10/08/2011   Procedure: CARDIOVERSION;  Surgeon: 2014, MD;  Location: [facility];  Service:  Cardiovascular;  Laterality: N/A;  Done in PACU  . CARDIOVERSION  01/10/2012   Procedure: CARDIOVERSION;  Surgeon: Marland Kitchen, MD;  Location: [facility] ENDOSCOPY;  Service: Cardiovascular;  Laterality: N/A;  . CARDIOVERSION N/A 11/17/2014   Procedure: CARDIOVERSION;  Surgeon: 03/11/2012, MD;  Location: [facility];  Service: Cardiovascular;  Laterality: N/A;  . RIGHT/LEFT HEART CATH AND CORONARY ANGIOGRAPHY N/A 10/02/2016   Procedure: Right/Left Heart Cath and Coronary Angiography;  Surgeon: [facility], MD;  Location: [facility] INVASIVE CV LAB;  Service: Cardiovascular;  Laterality: N/A;  . TEE WITHOUT CARDIOVERSION N/A 11/17/2014   Procedure: TRANSESOPHAGEAL ECHOCARDIOGRAM (TEE);  Surgeon: Laqueta Linden, MD;  Location: [facility];  Service: Cardiovascular;  Laterality: N/A;  . TEE WITHOUT CARDIOVERSION N/A 10/09/2016   Procedure: TRANSESOPHAGEAL ECHOCARDIOGRAM (TEE);  Surgeon: Lennette Bihari 01/17/2015, MD;  Location: [facility] ENDOSCOPY;  Service: Cardiovascular;  Laterality: N/A;  . TOTAL KNEE ARTHROPLASTY Left 01/16/2013   Procedure: TOTAL KNEE ARTHROPLASTY;  Surgeon: Elease Hashimoto, MD;  Location: [facility];  Service: Orthopedics;  Laterality: Left;    Current Medications: Outpatient Medications Prior to Visit  Medication Sig Dispense Refill  . allopurinol (ZYLOPRIM) 300 MG tablet Take 1 tablet by mouth once daily 60 tablet 5  . apixaban (ELIQUIS) 5 MG TABS tablet Take 1 tablet (5 mg total) by mouth 2 (two) times daily. 180 tablet 3  . famotidine (PEPCID) 20  MG tablet Take 1 tablet (20 mg total) by mouth 2 (two) times daily. 18 tablet 0  . furosemide (LASIX) 40 MG tablet Take 1 tablet (40 mg total) by mouth 2 (two) times daily. 60 tablet 11  . hydrALAZINE (APRESOLINE) 25 MG tablet Take 1 tablet (25 mg total) by mouth in the morning and at bedtime. 180 tablet 3  . metoprolol succinate (TOPROL-XL) 50 MG 24 hr tablet TAKE 1 TABLET BY MOUTH ONCE DAILY WITH  OR  IMMEDIATELY  FOLLOWING  A  MEAL 90 tablet 3  .  potassium chloride (KLOR-CON) 20 MEQ packet Take 20 mEq by mouth daily.    . potassium chloride SA (KLOR-CON) 20 MEQ tablet Take 1 tablet (20 mEq total) by mouth 2 (two) times daily. 180 tablet 3   No facility-administered medications prior to visit.     Allergies:   Azithromycin   Social History   Socioeconomic History  . Marital status: Married    Spouse name: Not on file  . Number of children: Not on file  . Years of education: Not on file  . Highest education level: Not on file  Occupational History  . Not on file  Tobacco Use  . Smoking status: Never Smoker  . Smokeless tobacco: Former Neurosurgeon    Types: Snuff  . Tobacco comment: quit 1980's  Vaping Use  . Vaping Use: Never used  Substance and Sexual Activity  . Alcohol use: Yes    Alcohol/week: 14.0 standard drinks    Types: 14 Cans of beer per week    Comment: 1-2 beers daily   . Drug use: Not Currently    Types: Marijuana    Comment: last time 2 years ago  . Sexual activity: Yes    Partners: Male    Birth control/protection: None  Other Topics Concern  . Not on file  Social History Narrative   Lives in [place] with his sister   David [name] off from [employer].   Social Determinants of Health   Financial Resource Strain:   . Difficulty of Paying Living Expenses: Not on file  Food Insecurity:   . Worried About Programme researcher, broadcasting/film/video in the Last Year: Not on file  . Ran Out of Food in the Last Year: Not on file  Transportation Needs:   . Lack of Transportation (Medical): Not on file  . Lack of Transportation (Non-Medical): Not on file  Physical Activity:   . Days of Exercise per Week: Not on file  . Minutes of Exercise per Session: Not on file  Stress:   . Feeling of Stress : Not on file  Social Connections:   . Frequency of Communication with Friends and Family: Not on file  . Frequency of Social Gatherings with Friends and Family: Not on file  . Attends Religious Services: Not on file  . Active Member of  Clubs or Organizations: Not on file  . Attends Banker Meetings: Not on file  . Marital Status: Not on file     Family History:  The patient's ***family history includes Hypertension in his brother, mother, and sister.   Review of Systems:   Please see the history of present illness.     General:  No chills, fever, night sweats or weight changes.  Cardiovascular:  No chest pain, dyspnea on exertion, edema, orthopnea, palpitations, paroxysmal nocturnal dyspnea. Dermatological: No rash, lesions/masses Respiratory: No cough, dyspnea Urologic: No hematuria, dysuria Abdominal:   No nausea, vomiting, diarrhea, bright red blood  per rectum, melena, or hematemesis Neurologic:  No visual changes, wkns, changes in mental status. All other systems reviewed and are otherwise negative except as noted above.   Physical Exam:    VS:  There were no vitals taken for this visit.   General: Well developed, well nourished,male appearing in no acute distress. Head: Normocephalic, atraumatic. Neck: No carotid bruits. JVD not elevated.  Lungs: Respirations regular and unlabored, without wheezes or rales.  Heart: ***Regular rate and rhythm. No S3 or S4.  No murmur, no rubs, or gallops appreciated. Abdomen: Appears non-distended. No obvious abdominal masses. Msk:  Strength and tone appear normal for age. No obvious joint deformities or effusions. Extremities: No clubbing or cyanosis. No edema.  Distal pedal pulses are 2+ bilaterally. Neuro: Alert and oriented X 3. Moves all extremities spontaneously. No focal deficits noted. Psych:  Responds to questions appropriately with a normal affect. Skin: No rashes or lesions noted  Wt Readings from Last 3 Encounters:  01/28/20 232 lb (105.2 kg)  11/17/19 213 lb 12.8 oz (97 kg)  01/26/19 206 lb (93.4 kg)        Studies/Labs Reviewed:   EKG:  EKG is*** ordered today.  The ekg ordered today demonstrates ***  Recent Labs: 02/11/2020: BUN 19;  Creat 1.45; Potassium 4.4; Sodium 137   Lipid Panel    Component Value Date/Time   CHOL 104 10/03/2016 0744   TRIG 34 10/03/2016 0744   HDL 30 (L) 10/03/2016 0744   CHOLHDL 3.5 10/03/2016 0744   VLDL 7 10/03/2016 0744   LDLCALC 67 10/03/2016 0744    Additional studies/ records that were reviewed today include:   Cardiac Catheterization: 09/2016 Echo documentation of ejection fraction of 15-20%.  Dilated left ventricle with an LVEDP of 27 mm.  Moderate right heart pressure elevation with moderate pulmonary hypertension.  Normal epicardial coronary arteries.  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  RECOMMENDATION: Aggressive medical therapy for a nonischemic dilated cardiomyopathy.  Consider ARB therapy with future transition to entresto, spironolactone,  carvedilol, and consideration for initial LifeVest with possible need for future prophylactic ICD implantation.   Echocardiogram: 11/2019 IMPRESSIONS    1. Left ventricular ejection fraction, by estimation, is approximately  35%. The left ventricle has moderately decreased function. The left  ventricle demonstrates global hypokinesis. The left ventricular internal  cavity size was mildly dilated. There is  moderate left ventricular hypertrophy. Left ventricular diastolic  parameters are indeterminate in the setting of atrial fibrillation.  2. Right ventricular systolic function is mildly reduced. The right  ventricular size is normal. Tricuspid regurgitation signal is inadequate  for assessing PA pressure.  3. Left atrial size was moderately dilated.  4. The mitral valve is grossly normal. Mild mitral valve regurgitation.  5. The aortic valve is bicuspid, mildly calcified. Aortic valve  regurgitation is moderate. Vena contracta 0.6 cm.  6. Aortic dilatation noted. There is moderate dilatation of the aortic  root.  7. The inferior vena cava is normal in size with greater than 50%   respiratory variability, suggesting right atrial pressure of 3 mmHg.   Assessment:    No diagnosis found.   Plan:   In order of problems listed above:  1. Chronic Systolic CHF/NICM - ***  2. Persistent Atrial Fibrillation - ***  3. HTN - ***  4. Aortic Regurgitation - ***  5. Stage 3 CKD - ***   Shared Decision Making/Informed Consent:   {Are you ordering a CV Procedure (e.g. stress test, cath,  DCCV, TEE, etc)?   Press F2        :[id number]}    Medication Adjustments/Labs and Tests Ordered: Current medicines are reviewed at length with the patient today.  Concerns regarding medicines are outlined above.  Medication changes, Labs and Tests ordered today are listed in the Patient Instructions below. There are no Patient Instructions on file for this visit.   Signed, Ellsworth Lennox, PA-C  03/11/2020 11:59 AM    [facility] [address] [place], [place] [address] Phone: [phone] Fax: [phone]

## 2020-04-04 ENCOUNTER — Telehealth: Payer: Self-pay | Admitting: Student

## 2020-04-04 ENCOUNTER — Other Ambulatory Visit: Payer: Self-pay

## 2020-04-04 MED ORDER — FUROSEMIDE 40 MG PO TABS
40.0000 mg | ORAL_TABLET | Freq: Two times a day (BID) | ORAL | 3 refills | Status: DC
Start: 2020-04-04 — End: 2022-02-02

## 2020-04-04 MED ORDER — HYDRALAZINE HCL 25 MG PO TABS: 25.0000 mg | ORAL_TABLET | Freq: Two times a day (BID) | ORAL | 3 refills | Status: DC

## 2020-04-04 NOTE — Telephone Encounter (Signed)
Refilled patients Hydralazine 25 mg and Lasix 40 mg. Sent to [facility] in [place].

## 2020-04-04 NOTE — Telephone Encounter (Signed)
     1. Which medications need to be refilled? (please list name of each medication and dose if known)  HYDRALAZINE 25 MG  LASIX 40 MG  2. Which pharmacy/location (including street and city if local pharmacy) is medication to be sent to? [facility]  3. Do they need a 30 day or 90 day supply?

## 2020-05-24 NOTE — Progress Notes (Signed)
Cardiology Office Note  Date: 05/25/2020   ID: David [name] [name], DOB 1960-10-06, MRN [id number]  PCP:  Salley Scarlet, MD  Cardiologist:  David Dell, MD Electrophysiologist:  None   Chief Complaint  Patient presents with  . Cardiac follow-up    History of Present Illness: David [name] is a 60 y.o. male last seen in October 2021 by Ms. Strader PA-C.  He presents today for a routine visit.  He has lost about 20 pounds since last assessment.  States that he has been taking his medications regularly including diuretics with potassium supplement.  He is having trouble with arthritic right knee pain, also leg cramps.  Last lab work was in November 2021.  Last echocardiogram was in August 2021 as noted below.  LVEF approximately 35% with global hypokinesis, improved compared to the prior assessment.  He does have a bicuspid aortic valve with moderate aortic regurgitation and a moderately dilated aortic root.  We discussed getting an updated study before his next visit.  I reviewed his medications which are outlined below.  Past Medical History:  Diagnosis Date  . Alcohol abuse   . Arthritis    Bilateral knees   . Atrial fibrillation (HCC)   . Chronic kidney disease, stage 2, mildly decreased GFR   . GERD (gastroesophageal reflux disease)   . Gout   . Nonischemic cardiomyopathy (HCC)    a. LVEF 20-25% in 2013, improved to 50% on medical therapy. b. 09/2016: echo showing reduced EF of 15-20% and cath showing normal cors.   Marland Kitchen TIA (transient ischemic attack)     Past Surgical History:  Procedure Laterality Date  . Arthroscopic knee surgery Right 1987  . CARDIOVERSION  10/08/2011   Procedure: CARDIOVERSION;  Surgeon: David Sidle, MD;  Location: [facility];  Service: Cardiovascular;  Laterality: N/A;  Done in PACU  . CARDIOVERSION  01/10/2012   Procedure: CARDIOVERSION;  Surgeon: David Maw, MD;  Location: [facility] ENDOSCOPY;  Service: Cardiovascular;  Laterality:  N/A;  . CARDIOVERSION N/A 11/17/2014   Procedure: CARDIOVERSION;  Surgeon: David Linden, MD;  Location: [facility];  Service: Cardiovascular;  Laterality: N/A;  . RIGHT/LEFT HEART CATH AND CORONARY ANGIOGRAPHY N/A 10/02/2016   Procedure: Right/Left Heart Cath and Coronary Angiography;  Surgeon: Lennette Bihari, MD;  Location: [facility] INVASIVE CV LAB;  Service: Cardiovascular;  Laterality: N/A;  . TEE WITHOUT CARDIOVERSION N/A 11/17/2014   Procedure: TRANSESOPHAGEAL ECHOCARDIOGRAM (TEE);  Surgeon: David Linden, MD;  Location: [facility];  Service: Cardiovascular;  Laterality: N/A;  . TEE WITHOUT CARDIOVERSION N/A 10/09/2016   Procedure: TRANSESOPHAGEAL ECHOCARDIOGRAM (TEE);  Surgeon: David Hashimoto Deloris Ping, MD;  Location: [facility] ENDOSCOPY;  Service: Cardiovascular;  Laterality: N/A;  . TOTAL KNEE ARTHROPLASTY Left 01/16/2013   Procedure: TOTAL KNEE ARTHROPLASTY;  Surgeon: David Hearing, MD;  Location: [facility];  Service: Orthopedics;  Laterality: Left;    Current Outpatient Medications  Medication Sig Dispense Refill  . allopurinol (ZYLOPRIM) 300 MG tablet Take 1 tablet by mouth once daily 60 tablet 5  . apixaban (ELIQUIS) 5 MG TABS tablet Take 1 tablet (5 mg total) by mouth 2 (two) times daily. 180 tablet 3  . famotidine (PEPCID) 20 MG tablet Take 1 tablet (20 mg total) by mouth 2 (two) times daily. 18 tablet 0  . furosemide (LASIX) 40 MG tablet Take 1 tablet (40 mg total) by mouth 2 (two) times daily. 180 tablet 3  . hydrALAZINE (APRESOLINE) 25 MG tablet Take  1 tablet (25 mg total) by mouth in the morning and at bedtime. 180 tablet 3  . metoprolol succinate (TOPROL-XL) 50 MG 24 hr tablet TAKE 1 TABLET BY MOUTH ONCE DAILY WITH  OR  IMMEDIATELY  FOLLOWING  A  MEAL 90 tablet 3  . potassium chloride SA (KLOR-CON) 20 MEQ tablet Take 1 tablet (20 mEq total) by mouth 2 (two) times daily. 180 tablet 3   No current facility-administered medications for this visit.   Allergies:  Azithromycin   ROS: No  palpitations or syncope.  Physical Exam: VS:  BP 118/76   Pulse 71   Ht 5\' 11"  (1.803 m)   Wt 217 lb 3.2 oz (98.5 kg)   SpO2 99%   BMI 30.29 kg/m , BMI Body mass index is 30.29 kg/m.  Wt Readings from Last 3 Encounters:  05/25/20 217 lb 3.2 oz (98.5 kg)  01/28/20 232 lb (105.2 kg)  11/17/19 213 lb 12.8 oz (97 kg)    General: Patient appears comfortable at rest. HEENT: Conjunctiva and lids normal, wearing a mask. Neck: Supple, no elevated JVP or carotid bruits, no thyromegaly. Lungs: Clear to auscultation, nonlabored breathing at rest. Cardiac: Irregularly irregular, no S3, 2/6 diastolic murmur, no pericardial rub. Extremities: No pitting edema, distal pulses 2+. Skin: Warm and dry. Musculoskeletal: No kyphosis. Neuropsychiatric: Alert and oriented x3, affect grossly appropriate.  ECG:  An ECG dated 11/17/2019 was personally reviewed today and demonstrated:  Atrial fibrillation with LVH and nonspecific T wave changes.  Recent Labwork: 02/11/2020: BUN 19; Creat 1.45; Potassium 4.4; Sodium 137   Other Studies Reviewed Today:  Echocardiogram 11/27/2019: 1. Left ventricular ejection fraction, by estimation, is approximately  35%. The left ventricle has moderately decreased function. The left  ventricle demonstrates global hypokinesis. The left ventricular internal  cavity size was mildly dilated. There is  moderate left ventricular hypertrophy. Left ventricular diastolic  parameters are indeterminate in the setting of atrial fibrillation.  2. Right ventricular systolic function is mildly reduced. The right  ventricular size is normal. Tricuspid regurgitation signal is inadequate  for assessing PA pressure.  3. Left atrial size was moderately dilated.  4. The mitral valve is grossly normal. Mild mitral valve regurgitation.  5. The aortic valve is bicuspid, mildly calcified. Aortic valve  regurgitation is moderate. Vena contracta 0.6 cm.  6. Aortic dilatation noted. There  is moderate dilatation of the aortic  root.  7. The inferior vena cava is normal in size with greater than 50%  respiratory variability, suggesting right atrial pressure of 3 mmHg.   Assessment and Plan:  1.  Persistent/permanent atrial fibrillation.  Continuing strategy of heart rate control at this time.  He remains on Toprol-XL and heart rate is in the 70s at rest today.  CHA2DS2-VASc score is 3, on Eliquis for stroke prophylaxis.  Check CBC.  2.  Chronic systolic heart failure with nonischemic cardiomyopathy, LVEF approximately 35% as of August 2021 and RV function mildly reduced.  He reports compliance with diuretics and potassium supplement.  Otherwise on Toprol-XL and hydralazine.  He has not been on ARB/ANRI/Aldactone with variable renal function over time.  We will obtain follow-up BMET.  May be a candidate for Entresto.  3.  Aortic regurgitation, moderate by last assessment in August 2021.  Also known bicuspid aortic valve with moderately dilated aortic root.  Reassess via echocardiogram prior to next visit in 6 months.  4.  CKD stage IIIb, last creatinine 1.45.  Medication Adjustments/Labs and Tests Ordered: Current medicines  are reviewed at length with the patient today.  Concerns regarding medicines are outlined above.   Tests Ordered: Orders Placed This Encounter  Procedures  . CBC  . Basic metabolic panel  . ECHOCARDIOGRAM COMPLETE    Medication Changes: No orders of the defined types were placed in this encounter.   Disposition:  Follow up 6 months in the [place] office.  Signed, David Sidle, MD, [facility] 05/25/2020 9:40 AM    [facility] at [facility] [address], [place], [place] [address] Phone: [phone]; Fax: [phone]

## 2020-05-25 ENCOUNTER — Other Ambulatory Visit (HOSPITAL_COMMUNITY)
Admission: RE | Admit: 2020-05-25 | Discharge: 2020-05-25 | Disposition: A | Discharge status: Home / Self Care | Payer: Medicaid Other | Source: Ambulatory Visit | Attending: Cardiology | Admitting: Cardiology

## 2020-05-25 ENCOUNTER — Other Ambulatory Visit: Payer: Self-pay

## 2020-05-25 ENCOUNTER — Ambulatory Visit (INDEPENDENT_AMBULATORY_CARE_PROVIDER_SITE_OTHER): Payer: Medicaid Other | Admitting: Cardiology

## 2020-05-25 ENCOUNTER — Encounter: Payer: Self-pay | Admitting: Cardiology

## 2020-05-25 VITALS — BP 118/76 | HR 71 | Ht 71.0 in | Wt 217.2 lb

## 2020-05-25 DIAGNOSIS — I351 Nonrheumatic aortic (valve) insufficiency: Secondary | ICD-10-CM | POA: Diagnosis not present

## 2020-05-25 DIAGNOSIS — I4819 Other persistent atrial fibrillation: Secondary | ICD-10-CM | POA: Insufficient documentation

## 2020-05-25 DIAGNOSIS — I428 Other cardiomyopathies: Secondary | ICD-10-CM | POA: Diagnosis present

## 2020-05-25 DIAGNOSIS — N1832 Chronic kidney disease, stage 3b: Secondary | ICD-10-CM

## 2020-05-25 LAB — CBC
HCT: 41.9 % (ref 39.0–52.0)
Hemoglobin: 13.8 g/dL (ref 13.0–17.0)
MCH: 32.2 pg (ref 26.0–34.0)
MCHC: 32.9 g/dL (ref 30.0–36.0)
MCV: 97.7 fL (ref 80.0–100.0)
Platelets: 318 10*3/uL (ref 150–400)
RBC: 4.29 MIL/uL (ref 4.22–5.81)
RDW: 13.2 % (ref 11.5–15.5)
WBC: 6.6 10*3/uL (ref 4.0–10.5)
nRBC: 0 % (ref 0.0–0.2)

## 2020-05-25 LAB — BASIC METABOLIC PANEL
Anion gap: 8 (ref 5–15)
BUN: 14 mg/dL (ref 6–20)
CO2: 22 mmol/L (ref 22–32)
Calcium: 9.1 mg/dL (ref 8.9–10.3)
Chloride: 101 mmol/L (ref 98–111)
Creatinine, Ser: 1.21 mg/dL (ref 0.61–1.24)
GFR, Estimated: >60 mL/min (ref >60)
Glucose, Bld: 93 mg/dL (ref 70–99)
Potassium: 3.8 mmol/L (ref 3.5–5.1)
Sodium: 131 mmol/L — ABNORMAL LOW (ref 135–145)

## 2020-05-25 NOTE — Patient Instructions (Signed)
Medication Instructions:  Your physician recommends that you continue on your current medications as directed. Please refer to the Current Medication list given to you today.  *If you need a refill on your cardiac medications before your next appointment, please call your pharmacy*   Lab Work: CBC, BMET If you have labs (blood work) drawn today and your tests are completely normal, you will receive your results only by: Marland Kitchen MyChart Message (if you have MyChart) OR . A paper copy in the mail If you have any lab test that is abnormal or we need to change your treatment, we will call you to review the results.   Testing/Procedures: Your physician has requested that you have an echocardiogram in 6 months. Echocardiography is a painless test that uses sound waves to create images of your heart. It provides your doctor with information about the size and shape of your heart and how well your heart's chambers and valves are working. This procedure takes approximately one hour. There are no restrictions for this procedure.     Follow-Up: At [facility], you and your health needs are our priority.  As part of our continuing mission to provide you with exceptional heart care, we have created designated Provider Care Teams.  These Care Teams include your primary Cardiologist (physician) and Advanced Practice Providers (APPs -  Physician Assistants and Nurse Practitioners) who all work together to provide you with the care you need, when you need it.  We recommend signing up for the patient portal called "MyChart".  Sign up information is provided on this After Visit Summary.  MyChart is used to connect with patients for Virtual Visits (Telemedicine).  Patients are able to view lab/test results, encounter notes, upcoming appointments, etc.  Non-urgent messages can be sent to your provider as well.   To learn more about what you can do with MyChart, go to [email or web address].    Your next  appointment:   6 month(s)  The format for your next appointment:   In Person  Provider:   Nona Dell, MD   Other Instructions None     Thank you for choosing [facility] !

## 2020-05-26 ENCOUNTER — Telehealth: Payer: Self-pay

## 2020-05-26 DIAGNOSIS — I5022 Chronic systolic (congestive) heart failure: Secondary | ICD-10-CM

## 2020-05-26 MED ORDER — ENTRESTO 24-26 MG PO TABS: 1.0000 | ORAL_TABLET | Freq: Two times a day (BID) | ORAL | 6 refills | Status: DC

## 2020-05-26 NOTE — Telephone Encounter (Signed)
Lab results given to patient. He agrees to stop hydralazine and begin Entresto 24/26 mg BID.Sample 14 day given to patient Lot [id number], exp 07/2022   BMET in 10 days   Patient states he will come tomorrow (2/18) to pick up

## 2020-05-26 NOTE — Telephone Encounter (Signed)
-----   Message from Jonelle Sidle, MD sent at 05/26/2020 11:16 AM EST ----- Results reviewed.  Potassium 3.8, creatinine down to 1.21, hemoglobin normal at 13.8.  Would like to see if we could transition him off of hydralazine and start low-dose Entresto 24/26 mg twice daily.  If he tolerates this, would recheck BMET in 10 days.

## 2020-05-27 ENCOUNTER — Telehealth: Payer: Self-pay | Admitting: Cardiology

## 2020-05-27 NOTE — Telephone Encounter (Signed)
Patient called in regards to samples that he picked up at the [facility]. Would like to speak with someone.

## 2020-05-27 NOTE — Telephone Encounter (Signed)
Patient wanted to confirm that his Hydralazine was the medication that was being replaced by the [facility]. This was confirmed and patient verbalized understanding.

## 2020-05-30 ENCOUNTER — Telehealth: Payer: Self-pay

## 2020-05-30 NOTE — Telephone Encounter (Signed)
Entresto 24/26 mg BID approved by [employer] from 05/30/20 thru 05/30/21  PA # [id number] [facility] pharmacy made aware

## 2020-08-04 ENCOUNTER — Other Ambulatory Visit: Payer: Self-pay | Admitting: Orthopaedic Surgery

## 2020-11-28 ENCOUNTER — Ambulatory Visit (HOSPITAL_COMMUNITY): Admission: RE | Admit: 2020-11-28 | Payer: Medicaid Other | Source: Ambulatory Visit

## 2020-12-05 ENCOUNTER — Ambulatory Visit: Payer: Medicaid Other | Admitting: Cardiology

## 2021-12-18 ENCOUNTER — Other Ambulatory Visit: Payer: Self-pay | Admitting: Nephrology

## 2021-12-18 ENCOUNTER — Other Ambulatory Visit (HOSPITAL_COMMUNITY): Payer: Self-pay | Admitting: Nephrology

## 2021-12-18 DIAGNOSIS — N1831 Chronic kidney disease, stage 3a: Secondary | ICD-10-CM

## 2021-12-18 DIAGNOSIS — I129 Hypertensive chronic kidney disease with stage 1 through stage 4 chronic kidney disease, or unspecified chronic kidney disease: Secondary | ICD-10-CM

## 2021-12-19 ENCOUNTER — Ambulatory Visit (HOSPITAL_COMMUNITY)
Admission: RE | Admit: 2021-12-19 | Discharge: 2021-12-19 | Disposition: A | Discharge status: Home / Self Care | Payer: Medicaid Other | Source: Ambulatory Visit | Attending: Nephrology | Admitting: Nephrology

## 2021-12-19 DIAGNOSIS — I129 Hypertensive chronic kidney disease with stage 1 through stage 4 chronic kidney disease, or unspecified chronic kidney disease: Secondary | ICD-10-CM | POA: Insufficient documentation

## 2021-12-19 DIAGNOSIS — N1831 Chronic kidney disease, stage 3a: Secondary | ICD-10-CM | POA: Insufficient documentation

## 2022-01-01 ENCOUNTER — Telehealth: Payer: Self-pay | Admitting: Cardiology

## 2022-01-01 NOTE — Telephone Encounter (Signed)
   Pre-operative Risk Assessment    Patient Name: David [name]  DOB: 1960-11-28 MRN: [id number]      Request for Surgical Clearance    Procedure:   Right Total Knee Arthroplasty  Date of Surgery:  Clearance 02/26/22                                 Surgeon:  Erich Montane Surgeon's Group or Practice Name:  [facility] Phone number:  [phone] ext [phone] Fax number:  [phone] attn: Colbert Ewing   Type of Clearance Requested:   - Medical  - Pharmacy:  Hold Apixaban (Eliquis)     Type of Anesthesia:  Not Indicated   Additional requests/questions:   Cardiac risk stratification: High, Medium or Low? May Veteran hold Apixaban 5 mg 72 hours prior to surgery? When can [facility] resume Apixaban 5 mg?  Signed, Hipolito Bayley   01/01/2022, 4:42 PM

## 2022-01-02 NOTE — Telephone Encounter (Signed)
Pt is scheduled in [place] on 02/02/22 for Preop clearance

## 2022-01-02 NOTE — Telephone Encounter (Signed)
   Name: David [name]  DOB: [birth year]  MRN: [id number]  Primary Cardiologist: Rozann Lesches, MD  Chart reviewed as part of pre-operative protocol coverage. Because of David [name]'s past medical history and time since last visit, he will require a follow-up in-office visit in order to better assess preoperative cardiovascular risk.  Pre-op covering staff: - Please schedule appointment and call patient to inform them. If patient already had an upcoming appointment within acceptable timeframe, please add "pre-op clearance" to the appointment notes so provider is aware. - Please contact requesting surgeon's office via preferred method (i.e, phone, fax) to inform them of need for appointment prior to surgery.  This message will also be routed to pharmacy pool for input on holding Eliquis as requested below so that this information is available to the clearing provider at time of patient's appointment.   Loel Dubonnet, NP  01/02/2022, 2:59 PM

## 2022-01-06 NOTE — Telephone Encounter (Signed)
Patient with diagnosis of atrial fibrillation on Eliquis for anticoagulation.    Procedure: right total knee arthroplast Date of procedure: 02/26/2022   CHA2DS2-VASc Score = 4   This indicates a 4.8% annual risk of stroke. The patient's score is based upon: CHF History: 1 HTN History: 0 Diabetes History: 0 Stroke History: 2 Vascular Disease History: 1 Age Score: 0 Gender Score: 0   Chart indicates history of TIA going back to 2013 with no other information.    CrCl 77 Platelet count 291  Will need 3 day hold for spinal anesthesia, will confirm okay with primary cardiologist Domenic Polite) to hold without bridging  **This guidance is not considered finalized until pre-operative APP has relayed final recommendations.**

## 2022-02-02 ENCOUNTER — Ambulatory Visit: Payer: Medicaid Other | Attending: Student | Admitting: Student

## 2022-02-02 ENCOUNTER — Encounter: Payer: Self-pay | Admitting: Student

## 2022-02-02 VITALS — BP 138/76 | HR 74 | Ht 71.0 in | Wt 210.0 lb

## 2022-02-02 DIAGNOSIS — I4819 Other persistent atrial fibrillation: Secondary | ICD-10-CM | POA: Diagnosis not present

## 2022-02-02 DIAGNOSIS — I10 Essential (primary) hypertension: Secondary | ICD-10-CM | POA: Diagnosis not present

## 2022-02-02 DIAGNOSIS — I513 Intracardiac thrombosis, not elsewhere classified: Secondary | ICD-10-CM

## 2022-02-02 DIAGNOSIS — Z01810 Encounter for preprocedural cardiovascular examination: Secondary | ICD-10-CM

## 2022-02-02 DIAGNOSIS — N1832 Chronic kidney disease, stage 3b: Secondary | ICD-10-CM

## 2022-02-02 DIAGNOSIS — I5020 Unspecified systolic (congestive) heart failure: Secondary | ICD-10-CM

## 2022-02-02 DIAGNOSIS — I351 Nonrheumatic aortic (valve) insufficiency: Secondary | ICD-10-CM | POA: Diagnosis not present

## 2022-02-02 NOTE — Patient Instructions (Signed)
Medication Instructions:  Your physician recommends that you continue on your current medications as directed. Please refer to the Current Medication list given to you today.  *If you need a refill on your cardiac medications before your next appointment, please call your pharmacy*   Lab Work: NONE   If you have labs (blood work) drawn today and your tests are completely normal, you will receive your results only by: MyChart Message (if you have MyChart) OR A paper copy in the mail If you have any lab test that is abnormal or we need to change your treatment, we will call you to review the results.   Testing/Procedures: Your physician has requested that you have an echocardiogram. Echocardiography is a painless test that uses sound waves to create images of your heart. It provides your doctor with information about the size and shape of your heart and how well your heart's chambers and valves are working. This procedure takes approximately one hour. There are no restrictions for this procedure. Please do NOT wear cologne, perfume, aftershave, or lotions (deodorant is allowed). Please arrive 15 minutes prior to your appointment time.    Follow-Up: At [facility], you and your health needs are our priority.  As part of our continuing mission to provide you with exceptional heart care, we have created designated Provider Care Teams.  These Care Teams include your primary Cardiologist (physician) and Advanced Practice Providers (APPs -  Physician Assistants and Nurse Practitioners) who all work together to provide you with the care you need, when you need it.  We recommend signing up for the patient portal called "MyChart".  Sign up information is provided on this After Visit Summary.  MyChart is used to connect with patients for Virtual Visits (Telemedicine).  Patients are able to view lab/test results, encounter notes, upcoming appointments, etc.  Non-urgent messages can be sent to  your provider as well.   To learn more about what you can do with MyChart, go to [email or web address].    Your next appointment:   6 month(s)  The format for your next appointment:   In Person  Provider:   Samuel McDowell, MD    Other Instructions Thank you for choosing [facility]!    Important Information About Sugar       

## 2022-02-02 NOTE — Progress Notes (Unsigned)
Cardiology Office Note    Date:  02/04/2022   ID:  David [name], DOB 10/02/1960, MRN [id number]  PCP:  [facility]  Cardiologist: Rozann Lesches, MD    Chief Complaint  Patient presents with   Follow-up    Overdue Visit; Preoperative Cardiac Clearance    History of Present Illness:    David [name] is a 61 y.o. male with past medical history of HFrEF/NICM (EF 15-20% by echo in 2018 with cath showing normal cors, EF 25-30% by echo in 11/2018 and 35% in 11/2019), moderate AI, LAA thrombus (history of this but resolved by repeat imaging), persistent atrial fibrillation, HTN and Stage 3 CKD who presents to the office today for overdue follow-up and preoperative cardiac clearance.  He was last examined by Dr. Domenic Polite in 05/2020 and reported activity was limited secondary to arthritic right knee pain but denied any recent anginal symptoms. He was continued on Toprol-XL and Hydralazine for his cardiomyopathy as he had not been on an ACE/ARB/ARNI/Aldactone given variable renal function over time. Was recommended to obtain a follow-up BMET and could consider Entresto if renal function remained stable. Given his bicuspid aortic valve, it was recommended to have a repeat echocardiogram prior to his next visit in 6 months. Follow-up labs did show his creatinine had improved to 1.21 and Hydralazine was discontinued with him being started on Entresto 24-26 mg twice daily. He did not have his echocardiogram in 11/2020 and has not followed up with Cardiology in the interim.  The office did receive a preoperative cardiac clearance request on 01/01/2022 for upcoming right total knee arthroplasty on 02/26/2022. An in-office evaluation was recommended given the timeframe since his last visit. Pharmacy did recommend holding Eliquis for 3 days given spinal anesthesia.  In talking with the patient today, he reports overall doing well from a cardiac perspective since his last office visit.  He denies any recent chest pain and reports his breathing has been stable. No specific orthopnea, PND or pitting edema. He does have known atrial fibrillation but denies any recent palpitations. Says his main limiting factor at this time is worsening knee pain and he is looking forward to undergoing knee replacement and having more mobility. He did miss his appointment for his echocardiogram last year. Also says that he did not continue taking Entresto as refills were not sent in at that time and has since been taking his prior heart medications including Eliquis, Lasix, Hydralazine, Toprol-XL and K-Dur.    Past Medical History:  Diagnosis Date   Alcohol abuse    Arthritis    Bilateral knees    Atrial fibrillation (HCC)    Chronic kidney disease, stage 2, mildly decreased GFR    GERD (gastroesophageal reflux disease)    Gout    Nonischemic cardiomyopathy ([place])    a. LVEF 20-25% in 2013, improved to 50% on medical therapy. b. 09/2016: echo showing reduced EF of 15-20% and cath showing normal cors.    TIA (transient ischemic attack)     Past Surgical History:  Procedure Laterality Date   Arthroscopic knee surgery Right 1987   CARDIOVERSION  10/08/2011   Procedure: CARDIOVERSION;  Surgeon: Satira Sark, MD;  Location: [facility];  Service: Cardiovascular;  Laterality: N/A;  Done in PACU   CARDIOVERSION  01/10/2012   Procedure: CARDIOVERSION;  Surgeon: Evans Lance, MD;  Location: [facility];  Service: Cardiovascular;  Laterality: N/A;   CARDIOVERSION N/A 11/17/2014   Procedure: CARDIOVERSION;  Surgeon:  Laqueta Linden, MD;  Location: [facility];  Service: Cardiovascular;  Laterality: N/A;   RIGHT/LEFT HEART CATH AND CORONARY ANGIOGRAPHY N/A 10/02/2016   Procedure: Right/Left Heart Cath and Coronary Angiography;  Surgeon: Lennette Bihari, MD;  Location: [facility] INVASIVE CV LAB;  Service: Cardiovascular;  Laterality: N/A;   TEE WITHOUT CARDIOVERSION N/A 11/17/2014   Procedure: TRANSESOPHAGEAL  ECHOCARDIOGRAM (TEE);  Surgeon: Laqueta Linden, MD;  Location: [facility];  Service: Cardiovascular;  Laterality: N/A;   TEE WITHOUT CARDIOVERSION N/A 10/09/2016   Procedure: TRANSESOPHAGEAL ECHOCARDIOGRAM (TEE);  Surgeon: Elease Hashimoto Deloris Ping, MD;  Location: [facility] ENDOSCOPY;  Service: Cardiovascular;  Laterality: N/A;   TOTAL KNEE ARTHROPLASTY Left 01/16/2013   Procedure: TOTAL KNEE ARTHROPLASTY;  Surgeon: Vickki Hearing, MD;  Location: [facility];  Service: Orthopedics;  Laterality: Left;    Current Medications: Outpatient Medications Prior to Visit  Medication Sig Dispense Refill   allopurinol (ZYLOPRIM) 300 MG tablet Take 1 tablet by mouth once daily 90 tablet 5   apixaban (ELIQUIS) 5 MG TABS tablet Take 1 tablet (5 mg total) by mouth 2 (two) times daily. 180 tablet 3   cholecalciferol (VITAMIN D3) 10 MCG (400 UNIT) TABS tablet Take 800 Units by mouth daily.     famotidine (PEPCID) 20 MG tablet Take 1 tablet (20 mg total) by mouth 2 (two) times daily. 18 tablet 0   furosemide (LASIX) 40 MG tablet Take 1 tablet by mouth daily.     hydrALAZINE (APRESOLINE) 25 MG tablet Take 1 tablet by mouth daily.     metoprolol succinate (TOPROL-XL) 50 MG 24 hr tablet TAKE 1 TABLET BY MOUTH ONCE DAILY WITH  OR  IMMEDIATELY  FOLLOWING  A  MEAL 90 tablet 3   potassium chloride SA (KLOR-CON) 20 MEQ tablet Take 1 tablet (20 mEq total) by mouth 2 (two) times daily. 180 tablet 3   furosemide (LASIX) 40 MG tablet Take 1 tablet (40 mg total) by mouth 2 (two) times daily. 180 tablet 3   sacubitril-valsartan (ENTRESTO) 24-26 MG Take 1 tablet by mouth 2 (two) times daily. 60 tablet 6   No facility-administered medications prior to visit.     Allergies:   Azithromycin   Social History   Socioeconomic History   Marital status: Married    Spouse name: Not on file   Number of children: Not on file   Years of education: Not on file   Highest education level: Not on file  Occupational History   Not on file  Tobacco  Use   Smoking status: Never   Smokeless tobacco: Former    Types: Snuff   Tobacco comments:    quit 1980's  Vaping Use   Vaping Use: Never used  Substance and Sexual Activity   Alcohol use: Yes    Alcohol/week: 14.0 standard drinks of alcohol    Types: 14 Cans of beer per week    Comment: 1-2 beers daily    Drug use: Not Currently    Types: Marijuana    Comment: last time 2 years ago   Sexual activity: Yes    Partners: Male    Birth control/protection: None  Other Topics Concern   Not on file  Social History Narrative   Lives in [place] with his sister   Laid off from [employer].   Social Determinants of Health   Financial Resource Strain: Not on file  Food Insecurity: Not on file  Transportation Needs: Not on file  Physical Activity: Not on file  Stress: Not on file  Social Connections: Not on file     Family History:  The patient's family history includes Hypertension in his brother, mother, and sister.   Review of Systems:    Please see the history of present illness.     All other systems reviewed and are otherwise negative except as noted above.   Physical Exam:    VS:  BP 138/76   Pulse 74   Ht 5\' 11"  (1.803 m)   Wt 210 lb (95.3 kg)   SpO2 97%   BMI 29.29 kg/m    General: Well developed, well nourished,male appearing in no acute distress. Head: Normocephalic, atraumatic. Neck: No carotid bruits. JVD not elevated.  Lungs: Respirations regular and unlabored, without wheezes or rales.  Heart: Irregularly irregular. No S3 or S4.  2/6 diastolic murmur along RUSB.  Abdomen: Appears non-distended. No obvious abdominal masses. Msk:  Strength and tone appear normal for age. No obvious joint deformities or effusions. Extremities: No clubbing or cyanosis. No pitting edema.  Distal pedal pulses are 2+ bilaterally. Neuro: Alert and oriented X 3. Moves all extremities spontaneously. No focal deficits noted. Psych:  Responds to questions appropriately with a  normal affect. Skin: No rashes or lesions noted  Wt Readings from Last 3 Encounters:  02/02/22 210 lb (95.3 kg)  05/25/20 217 lb 3.2 oz (98.5 kg)  01/28/20 232 lb (105.2 kg)     Studies/Labs Reviewed:   EKG:  EKG is ordered today. The ekg ordered today demonstrates rate-controlled atrial fibrillation, HR 74 with IVCD. No acute ST changes.   Recent Labs: No results found for requested labs within last 365 days.   Lipid Panel    Component Value Date/Time   CHOL 104 10/03/2016 0744   TRIG 34 10/03/2016 0744   HDL 30 (L) 10/03/2016 0744   CHOLHDL 3.5 10/03/2016 0744   VLDL 7 10/03/2016 0744   LDLCALC 67 10/03/2016 0744    Additional studies/ records that were reviewed today include:   R/LHC: 09/2016 Echo documentation of ejection fraction of 15-20%.   Dilated left ventricle with an LVEDP of 27 mm.   Moderate right heart pressure elevation with moderate pulmonary hypertension.   Normal epicardial coronary arteries.   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   RECOMMENDATION: Aggressive medical therapy for a nonischemic dilated cardiomyopathy.  Consider ARB therapy with future transition to entresto, spironolactone,  carvedilol, and consideration for initial LifeVest with possible need for future prophylactic ICD implantation.  Echocardiogram: 11/2019 IMPRESSIONS     1. Left ventricular ejection fraction, by estimation, is approximately  35%. The left ventricle has moderately decreased function. The left  ventricle demonstrates global hypokinesis. The left ventricular internal  cavity size was mildly dilated. There is  moderate left ventricular hypertrophy. Left ventricular diastolic  parameters are indeterminate in the setting of atrial fibrillation.   2. Right ventricular systolic function is mildly reduced. The right  ventricular size is normal. Tricuspid regurgitation signal is inadequate  for assessing PA pressure.   3. Left atrial size  was moderately dilated.   4. The mitral valve is grossly normal. Mild mitral valve regurgitation.   5. The aortic valve is bicuspid, mildly calcified. Aortic valve  regurgitation is moderate. Vena contracta 0.6 cm.   6. Aortic dilatation noted. There is moderate dilatation of the aortic  root.   7. The inferior vena cava is normal in size with greater than 50%  respiratory variability, suggesting right atrial pressure of 3  mmHg.   Assessment:    1. HFrEF (heart failure with reduced ejection fraction) (HCC)   2. Essential hypertension   3. Persistent atrial fibrillation (HCC)   4. Nonrheumatic aortic valve insufficiency   5. Left atrial thrombus   6. Stage 3b chronic kidney disease (HCC)   7. Preoperative cardiovascular examination      Plan:   In order of problems listed above:  1. HFrEF - He has a long-standing cardiomyopathy with EF at 15-20% by echo in 2018 with cath showing normal cors, EF 25-30% by echo in 11/2018 and 35% in 11/2019.  - He appears euvolemic by examination today. Will plan to obtain a follow-up echocardiogram as previously recommended. He did stop Entresto in the interim due to not having refills of the medication. Pending reassessment of his EF, would plan to stop Hydralazine and restart Entresto 24-26mg  BID with a follow-up BMET in 2 weeks. Continue Toprol-XL 50mg  daily and Lasix 40mg  daily. Would ultimately like to add Spironolactone and an SGLT2 inhibitor if renal function remains stable. If his EF remains < 35% despite optimization of GDMT, would recommend EP referral for consideration of ICD placement.  2. HTN - His BP is at 138/76 during today's visit. He remains on Hydralazine and Toprol-XL for now. Pending repeat echocardiogram, would plan to switch Hydralazine to [facility] as outlined above.  3. Persistent Atrial Fibrillation - HR remains well-controlled in the 70's. Continue Toprol-XL 50mg  daily for rate-control.  - No reports of active bleeding. Hgb  was at 12.7 on 12/22/2021. On Eliquis 5mg  BID for anticoagulation.  4. Aortic Regurgitation - This was moderate by echocardiogram in 2021. Will plan for repeat imaging as discussed above.  5. LAA Thrombus - He has a history of this but resolved by repeat imaging. Will plan for a repeat echocardiogram. He remains on Eliquis 5 mg twice daily for anticoagulation.   6. Stage 3 CKD - Followed by Dr. and creatinine was stable at 1.44 in 12/2021 which is close to his baseline. Will repeat a BMET if switching to [facility].   7. Preoperative Cardiac Clearance for Total Knee Arthroplasty - His RCRI risk is at 6.6% risk of a major cardiac event. Will plan to obtain a follow-up echocardiogram for reassessment of his EF prior to knee replacement. He is at least moderate risk for this if his EF remains significantly reduced. He does not require further ischemic testing prior to surgery given his normal coronary arteries by prior catheterization and no recent anginal symptoms. Previously recommended by Pharmacy to hold Eliquis for 3 days prior to his surgery (repeat echo pending for reassessment of LAA thrombus). Will forward today's note to the requesting provider.   Medication Adjustments/Labs and Tests Ordered: Current medicines are reviewed at length with the patient today.  Concerns regarding medicines are outlined above.  Medication changes, Labs and Tests ordered today are listed in the Patient Instructions below. Patient Instructions  Medication Instructions:  Your physician recommends that you continue on your current medications as directed. Please refer to the Current Medication list given to you today.  *If you need a refill on your cardiac medications before your next appointment, please call your pharmacy*   Lab Work: NONE   If you have labs (blood work) drawn today and your tests are completely normal, you will receive your results only by: MyChart Message (if you have MyChart)  OR A paper copy in the mail If you have any lab test that is abnormal or we  need to change your treatment, we will call you to review the results.   Testing/Procedures: Your physician has requested that you have an echocardiogram. Echocardiography is a painless test that uses sound waves to create images of your heart. It provides your doctor with information about the size and shape of your heart and how well your heart's chambers and valves are working. This procedure takes approximately one hour. There are no restrictions for this procedure. Please do NOT wear cologne, perfume, aftershave, or lotions (deodorant is allowed). Please arrive 15 minutes prior to your appointment time.    Follow-Up: At [facility], you and your health needs are our priority.  As part of our continuing mission to provide you with exceptional heart care, we have created designated Provider Care Teams.  These Care Teams include your primary Cardiologist (physician) and Advanced Practice Providers (APPs -  Physician Assistants and Nurse Practitioners) who all work together to provide you with the care you need, when you need it.  We recommend signing up for the patient portal called "MyChart".  Sign up information is provided on this After Visit Summary.  MyChart is used to connect with patients for Virtual Visits (Telemedicine).  Patients are able to view lab/test results, encounter notes, upcoming appointments, etc.  Non-urgent messages can be sent to your provider as well.   To learn more about what you can do with MyChart, go to [email or web address].    Your next appointment:   6 month(s)  The format for your next appointment:   In Person  Provider:   Nona Dell, MD    Other Instructions Thank you for choosing [facility]!    Important Information About Sugar         Signed, Ellsworth Lennox, PA-C  02/04/2022 2:03 PM    [facility] [address] [place], [place] [address] Phone: [phone] Fax: [phone]

## 2022-02-06 ENCOUNTER — Other Ambulatory Visit (HOSPITAL_COMMUNITY): Payer: Medicaid Other

## 2022-02-06 ENCOUNTER — Ambulatory Visit (HOSPITAL_COMMUNITY)
Admission: RE | Admit: 2022-02-06 | Discharge: 2022-02-06 | Disposition: A | Discharge status: Home / Self Care | Payer: Medicaid Other | Source: Ambulatory Visit | Attending: Student | Admitting: Student

## 2022-02-06 DIAGNOSIS — Z01810 Encounter for preprocedural cardiovascular examination: Secondary | ICD-10-CM | POA: Insufficient documentation

## 2022-02-06 DIAGNOSIS — I5020 Unspecified systolic (congestive) heart failure: Secondary | ICD-10-CM | POA: Insufficient documentation

## 2022-02-06 LAB — ECHOCARDIOGRAM COMPLETE
Area-P 1/2: 3.53 cm2
P 1/2 time: 705 msec
S' Lateral: 4.9 cm

## 2022-02-06 NOTE — Progress Notes (Signed)
*  PRELIMINARY RESULTS* Echocardiogram 2D Echocardiogram has been performed.  Samuel Germany 02/06/2022, 10:14 AM

## 2022-02-07 ENCOUNTER — Telehealth: Payer: Self-pay | Admitting: *Deleted

## 2022-02-07 MED ORDER — ENTRESTO 24-26 MG PO TABS: 1.0000 | ORAL_TABLET | Freq: Two times a day (BID) | ORAL | 3 refills | Status: DC

## 2022-02-07 NOTE — Telephone Encounter (Signed)
Pt notified of echo results and to stop Hydralazine and start Entresto 24-26mg  .

## 2022-02-07 NOTE — Telephone Encounter (Signed)
-----   Message from Erma Heritage, [place] sent at 02/06/2022  4:12 PM EDT ----- Please let the patient know his repeat echocardiogram shows slight improvement in his ejection fraction as this was previously at 35% and is now at 35-40%. His aortic regurgitation remains in a moderate range and he does have dilation of the aortic root at 48 mm. Given that his ejection fraction remains reduced, I would recommend stopping Hydralazine and switching to Entresto 24-26mg  BID as discussed at his office visit. Repeat BMET in 2 weeks following medication adjustment.   In regard to his surgery, he is at intermediate-risk to proceed and does not require further cardiac testing. Please fax a copy of results to [phone] Attn: [place] as listed in the initial surgical request. Thanks!

## 2022-02-20 ENCOUNTER — Telehealth: Payer: Self-pay | Admitting: Cardiology

## 2022-02-20 MED ORDER — ENTRESTO 24-26 MG PO TABS: 1.0000 | ORAL_TABLET | Freq: Two times a day (BID) | ORAL | 3 refills | Status: DC

## 2022-02-20 NOTE — Telephone Encounter (Signed)
Completed.

## 2022-02-20 NOTE — Telephone Encounter (Signed)
*  STAT* If patient is at the pharmacy, call can be transferred to refill team.   1. Which medications need to be refilled? (please list name of each medication and dose if known)   sacubitril-valsartan (ENTRESTO) 24-26 MG    2. Which pharmacy/location (including street and city if local pharmacy) is medication to be sent to?  [facility] - [place], [place] - [address]   3. Do they need a 30 day or 90 day supply? [id number]  Pharmacy told patient that they didn't have a prescription for it.

## 2022-06-11 ENCOUNTER — Other Ambulatory Visit (HOSPITAL_COMMUNITY)
Admission: RE | Admit: 2022-06-11 | Discharge: 2022-06-11 | Disposition: A | Discharge status: Home / Self Care | Payer: Medicaid Other | Source: Ambulatory Visit | Attending: Nephrology | Admitting: Nephrology

## 2022-06-11 DIAGNOSIS — E559 Vitamin D deficiency, unspecified: Secondary | ICD-10-CM | POA: Insufficient documentation

## 2022-06-11 DIAGNOSIS — I5022 Chronic systolic (congestive) heart failure: Secondary | ICD-10-CM | POA: Insufficient documentation

## 2022-06-11 DIAGNOSIS — I129 Hypertensive chronic kidney disease with stage 1 through stage 4 chronic kidney disease, or unspecified chronic kidney disease: Secondary | ICD-10-CM | POA: Diagnosis present

## 2022-06-11 DIAGNOSIS — I130 Hypertensive heart and chronic kidney disease with heart failure and stage 1 through stage 4 chronic kidney disease, or unspecified chronic kidney disease: Secondary | ICD-10-CM | POA: Insufficient documentation

## 2022-06-11 DIAGNOSIS — D638 Anemia in other chronic diseases classified elsewhere: Secondary | ICD-10-CM | POA: Insufficient documentation

## 2022-06-11 DIAGNOSIS — R778 Other specified abnormalities of plasma proteins: Secondary | ICD-10-CM | POA: Insufficient documentation

## 2022-06-11 DIAGNOSIS — N1831 Chronic kidney disease, stage 3a: Secondary | ICD-10-CM | POA: Insufficient documentation

## 2022-06-11 LAB — RENAL FUNCTION PANEL
Albumin: 3.7 g/dL (ref 3.5–5.0)
Anion gap: 10 (ref 5–15)
BUN: 25 mg/dL — ABNORMAL HIGH (ref 6–20)
CO2: 26 mmol/L (ref 22–32)
Calcium: 9 mg/dL (ref 8.9–10.3)
Chloride: 100 mmol/L (ref 98–111)
Creatinine, Ser: 1.49 mg/dL — ABNORMAL HIGH (ref 0.61–1.24)
GFR, Estimated: 53 mL/min — ABNORMAL LOW (ref >60)
Glucose, Bld: 108 mg/dL — ABNORMAL HIGH (ref 70–99)
Phosphorus: 3.2 mg/dL (ref 2.5–4.6)
Potassium: 4.1 mmol/L (ref 3.5–5.1)
Sodium: 136 mmol/L (ref 135–145)

## 2022-06-11 LAB — CBC
HCT: 40.3 % (ref 39.0–52.0)
Hemoglobin: 13.1 g/dL (ref 13.0–17.0)
MCH: 31.4 pg (ref 26.0–34.0)
MCHC: 32.5 g/dL (ref 30.0–36.0)
MCV: 96.6 fL (ref 80.0–100.0)
Platelets: 341 10*3/uL (ref 150–400)
RBC: 4.17 MIL/uL — ABNORMAL LOW (ref 4.22–5.81)
RDW: 14.2 % (ref 11.5–15.5)
WBC: 7.5 10*3/uL (ref 4.0–10.5)
nRBC: 0 % (ref 0.0–0.2)

## 2022-06-11 LAB — PROTEIN / CREATININE RATIO, URINE
Creatinine, Urine: 13 mg/dL
Total Protein, Urine: <6 mg/dL

## 2022-06-21 NOTE — Progress Notes (Signed)
[facility] [address] [place] [address]   Clinic Day:  06/22/2022  Referring physician: Liana Gerold, MD  Patient Care Team: [place] as PCP - General (General Practice) Satira Sark, MD as PCP - Cardiology (Cardiology) Derek Jack, MD as Medical Oncologist (Hematology)   ASSESSMENT & PLAN:   Assessment:  1.  IgG lambda monoclonal gammopathy: - Patient seen at the request of Dr. Theador Hawthorne - 01/01/2022: SPEP-0.3 g M spike, kappa-19.2, lambda-163.4, ratio 0.12.  IFE-IgG lambda.  24-hour urine IFE-IgG lambda.  24-hour urine protein 84 mg.  Hb-12.7, creatinine-1.44, calcium-9.3, albumin-3.7 - History of right knee TKR on 02/26/2022. - No new bone pains.  Chronic back pain stable.  Mild fatigue.  No B symptoms.  2.  Social/family history: - Lives at home with his wife.  He is currently disabled.  Previously worked at a [employer].  Non-smoker. - Maternal uncle had lung cancer.  No family history of multiple myeloma.  Plan:  1.  IgG lambda MGUS: - We reviewed the spectrum of plasma cell disorders ranging from MGUS to multiple myeloma. - Recommend repeating SPEP and free light chains along with LDH and beta-2 microglobulin today. - Will plan on checking skeletal survey today. - Will also check ferritin and iron panel as he has mild anemia and CKD. - RTC 2 weeks for follow-up.  If no "crab" features, will monitor at 33-month intervals with myeloma labs and yearly skeletal survey.   Orders Placed This Encounter  Procedures   DG Bone Survey Met    Standing Status:   Future    Number of Occurrences:   1    Standing Expiration Date:   06/22/2023    Order Specific Question:   Reason for Exam (SYMPTOM  OR DIAGNOSIS REQUIRED)    Answer:   MGUS    Order Specific Question:   Preferred imaging location?    Answer:   [facility]    Order Specific Question:   Release to patient    Answer:   Immediate   CBC with Differential     Standing Status:   Future    Number of Occurrences:   1    Standing Expiration Date:   06/22/2023   Comprehensive metabolic panel    Standing Status:   Future    Number of Occurrences:   1    Standing Expiration Date:   06/22/2023   Lactate dehydrogenase    Standing Status:   Future    Number of Occurrences:   1    Standing Expiration Date:   06/22/2023   Protein electrophoresis, serum    Standing Status:   Future    Number of Occurrences:   1    Standing Expiration Date:   06/22/2023   Kappa/lambda light chains    Standing Status:   Future    Number of Occurrences:   1    Standing Expiration Date:   06/22/2023   Beta 2 microglobuline, serum    Standing Status:   Future    Number of Occurrences:   1    Standing Expiration Date:   06/22/2023   Immunofixation electrophoresis    Standing Status:   Future    Number of Occurrences:   1    Standing Expiration Date:   06/22/2023   Ferritin    Standing Status:   Future    Number of Occurrences:   1    Standing Expiration Date:  06/22/2023   Iron and TIBC ([facility] ONLY)    Standing Status:   Future    Number of Occurrences:   1    Standing Expiration Date:   06/22/2023      I,Alexis Herring,acting as a scribe for Derek Jack, MD.,have documented all relevant documentation on the behalf of Derek Jack, MD,as directed by  Derek Jack, MD while in the presence of Derek Jack, MD.   I, Derek Jack MD, have reviewed the above documentation for accuracy and completeness, and I agree with the above.   Derek Jack, MD   3/15/20243:23 PM  CHIEF COMPLAINT/PURPOSE OF CONSULT:   Diagnosis: IgG lambda MGUS  Current Therapy: Under workup  HISTORY OF PRESENT ILLNESS:   David [name] is a 62 y.o. male presenting to clinic today for evaluation of MGUS at the request of Dr. Theador Hawthorne.  Today, he states that he is doing well overall. His appetite level is at 100%. His energy level  is at 70%.  He had routine blood work on 12/22/2021 which showed 0.3 g of IgG lambda monoclonal spike.  Free light chain ratio showed elevated lambda light chains of 163.4 and ratio of 0.12.  24-hour urine immunofixation was also positive for IgG lambda.  He never had blood transfusion in the past.  He reports mild fatigue but denies any new onset pains.  He has right knee pain and underwent TKR on 02/26/2022.  Also has chronic back pain.  PAST MEDICAL HISTORY:   Past Medical History: Past Medical History:  Diagnosis Date   Alcohol abuse    Arthritis    Bilateral knees    Atrial fibrillation (HCC)    Chronic kidney disease, stage 2, mildly decreased GFR    GERD (gastroesophageal reflux disease)    Gout    Nonischemic cardiomyopathy ([place])    a. LVEF 20-25% in 2013, improved to 50% on medical therapy. b. 09/2016: echo showing reduced EF of 15-20% and cath showing normal cors.    TIA (transient ischemic attack)     Surgical History: Past Surgical History:  Procedure Laterality Date   Arthroscopic knee surgery Right 1987   CARDIOVERSION  10/08/2011   Procedure: CARDIOVERSION;  Surgeon: Satira Sark, MD;  Location: [facility]S;  Service: Cardiovascular;  Laterality: N/A;  Done in PACU   CARDIOVERSION  01/10/2012   Procedure: CARDIOVERSION;  Surgeon: Evans Lance, MD;  Location: [facility] ENDOSCOPY;  Service: Cardiovascular;  Laterality: N/A;   CARDIOVERSION N/A 11/17/2014   Procedure: CARDIOVERSION;  Surgeon: Herminio Commons, MD;  Location: [facility] ORS;  Service: Cardiovascular;  Laterality: N/A;   RIGHT/LEFT HEART CATH AND CORONARY ANGIOGRAPHY N/A 10/02/2016   Procedure: Right/Left Heart Cath and Coronary Angiography;  Surgeon: Troy Sine, MD;  Location: [facility] CV LAB;  Service: Cardiovascular;  Laterality: N/A;   TEE WITHOUT CARDIOVERSION N/A 11/17/2014   Procedure: TRANSESOPHAGEAL ECHOCARDIOGRAM (TEE);  Surgeon: Herminio Commons, MD;  Location: [facility]S;  Service: Cardiovascular;   Laterality: N/A;   TEE WITHOUT CARDIOVERSION N/A 10/09/2016   Procedure: TRANSESOPHAGEAL ECHOCARDIOGRAM (TEE);  Surgeon: Acie Fredrickson Wonda Cheng, MD;  Location: [facility] ENDOSCOPY;  Service: Cardiovascular;  Laterality: N/A;   TOTAL KNEE ARTHROPLASTY Left 01/16/2013   Procedure: TOTAL KNEE ARTHROPLASTY;  Surgeon: Carole Civil, MD;  Location: [facility]S;  Service: Orthopedics;  Laterality: Left;    Social History: Social History   Socioeconomic History   Marital status: Married    Spouse name: Not on file   Number of children: Not  on file   Years of education: Not on file   Highest education level: Not on file  Occupational History   Not on file  Tobacco Use   Smoking status: Never   Smokeless tobacco: Former    Types: Snuff   Tobacco comments:    quit 1980's  Vaping Use   Vaping Use: Never used  Substance and Sexual Activity   Alcohol use: Yes    Alcohol/week: 14.0 standard drinks of alcohol    Types: 14 Cans of beer per week    Comment: 1-2 beers daily    Drug use: Not Currently    Types: Marijuana    Comment: last time 2 years ago   Sexual activity: Yes    Partners: Male    Birth control/protection: None  Other Topics Concern   Not on file  Social History Narrative   Lives in [place] with his sister   Laid off from [employer].   Social Determinants of Health   Financial Resource Strain: Not on file  Food Insecurity: Food Insecurity Present (06/22/2022)   Hunger Vital Sign    Worried About Running Out of Food in the Last Year: Sometimes true    Ran Out of Food in the Last Year: Sometimes true  Transportation Needs: Unmet Transportation Needs (06/22/2022)   PRAPARE - Hydrologist (Medical): Yes    Lack of Transportation (Non-Medical): Yes  Physical Activity: Not on file  Stress: Not on file  Social Connections: Not on file  Intimate Partner Violence: Not At Risk (06/22/2022)   Humiliation, Afraid, Rape, and Kick questionnaire    Fear of  Current or Ex-Partner: No    Emotionally Abused: No    Physically Abused: No    Sexually Abused: No    Family History: Family History  Problem Relation Age of Onset   Hypertension Mother    Hypertension Sister    Hypertension Brother     Current Medications:  Current Outpatient Medications:    allopurinol (ZYLOPRIM) 300 MG tablet, Take 1 tablet by mouth once daily, Disp: 90 tablet, Rfl: 5   apixaban (ELIQUIS) 5 MG TABS tablet, Take 1 tablet (5 mg total) by mouth 2 (two) times daily., Disp: 180 tablet, Rfl: 3   cholecalciferol (VITAMIN D3) 10 MCG (400 UNIT) TABS tablet, Take 800 Units by mouth daily., Disp: , Rfl:    famotidine (PEPCID) 20 MG tablet, Take 1 tablet (20 mg total) by mouth 2 (two) times daily., Disp: 18 tablet, Rfl: 0   furosemide (LASIX) 40 MG tablet, Take 1 tablet by mouth daily., Disp: , Rfl:    metoprolol succinate (TOPROL-XL) 50 MG 24 hr tablet, TAKE 1 TABLET BY MOUTH ONCE DAILY WITH  OR  IMMEDIATELY  FOLLOWING  A  MEAL, Disp: 90 tablet, Rfl: 3   potassium chloride SA (KLOR-CON) 20 MEQ tablet, Take 1 tablet (20 mEq total) by mouth 2 (two) times daily., Disp: 180 tablet, Rfl: 3   sacubitril-valsartan (ENTRESTO) 24-26 MG, Take 1 tablet by mouth 2 (two) times daily., Disp: 180 tablet, Rfl: 3   Allergies: Allergies  Allergen Reactions   Azithromycin Itching    REVIEW OF SYSTEMS:   Review of Systems  Constitutional:  Negative for chills, fatigue and fever.  HENT:   Negative for lump/mass, mouth sores, nosebleeds, sore throat and trouble swallowing.   Eyes:  Negative for eye problems.  Respiratory:  Positive for cough and shortness of breath.   Cardiovascular:  Negative for chest  pain, leg swelling and palpitations.  Gastrointestinal:  Negative for abdominal pain, constipation, diarrhea, nausea and vomiting.  Genitourinary:  Negative for bladder incontinence, difficulty urinating, dysuria, frequency, hematuria and nocturia.   Musculoskeletal:  Negative for  arthralgias, back pain, flank pain, myalgias and neck pain.  Skin:  Negative for itching and rash.  Neurological:  Positive for numbness. Negative for dizziness and headaches.  Hematological:  Does not bruise/bleed easily.  Psychiatric/Behavioral:  Positive for sleep disturbance. Negative for depression and suicidal ideas. The patient is not nervous/anxious.   All other systems reviewed and are negative.    VITALS:   Blood pressure 109/73, pulse 67, temperature 98.2 F (36.8 C), temperature source Oral, resp. rate 18, height 5\' 11"  (1.803 m), weight 210 lb 11.2 oz (95.6 kg), SpO2 98 %.  Wt Readings from Last 3 Encounters:  06/22/22 210 lb 11.2 oz (95.6 kg)  02/02/22 210 lb (95.3 kg)  05/25/20 217 lb 3.2 oz (98.5 kg)    Body mass index is 29.39 kg/m.   PHYSICAL EXAM:   Physical Exam Vitals and nursing note reviewed. Exam conducted with a chaperone present.  Constitutional:      Appearance: Normal appearance.  Cardiovascular:     Rate and Rhythm: Normal rate and regular rhythm.     Pulses: Normal pulses.     Heart sounds: Normal heart sounds.  Pulmonary:     Effort: Pulmonary effort is normal.     Breath sounds: Normal breath sounds.  Abdominal:     Palpations: Abdomen is soft. There is no hepatomegaly, splenomegaly or mass.     Tenderness: There is no abdominal tenderness.  Musculoskeletal:     Right lower leg: No edema.     Left lower leg: No edema.  Lymphadenopathy:     Cervical: No cervical adenopathy.     Right cervical: No superficial, deep or posterior cervical adenopathy.    Left cervical: No superficial, deep or posterior cervical adenopathy.     Upper Body:     Right upper body: No supraclavicular or axillary adenopathy.     Left upper body: No supraclavicular or axillary adenopathy.  Neurological:     General: No focal deficit present.     Mental Status: He is alert and oriented to person, place, and time.  Psychiatric:        Mood and Affect: Mood normal.         Behavior: Behavior normal.     LABS:      Latest Ref Rng & Units 06/22/2022   10:23 AM 06/11/2022    9:27 AM 05/25/2020    1:50 PM  CBC  WBC 4.0 - 10.5 K/uL 6.5  7.5  6.6   Hemoglobin 13.0 - 17.0 g/dL 12.8  13.1  13.8   Hematocrit 39.0 - 52.0 % 38.7  40.3  41.9   Platelets 150 - 400 K/uL 348  341  318       Latest Ref Rng & Units 06/22/2022   10:23 AM 06/11/2022    9:27 AM 05/25/2020    1:50 PM  CMP  Glucose 70 - 99 mg/dL 93  108  93   BUN 6 - 20 mg/dL 24  25  14    Creatinine 0.61 - 1.24 mg/dL 1.68  1.49  1.21   Sodium 135 - 145 mmol/L 135  136  131   Potassium 3.5 - 5.1 mmol/L 4.2  4.1  3.8   Chloride 98 - 111 mmol/L 101  100  101  CO2 22 - 32 mmol/L 25  26  22    Calcium 8.9 - 10.3 mg/dL 8.9  9.0  9.1   Total Protein 6.5 - 8.1 g/dL 8.5     Total Bilirubin 0.3 - 1.2 mg/dL 0.7     Alkaline Phos 38 - 126 U/L 91     AST 15 - 41 U/L 16     ALT 0 - 44 U/L 11       No results found for: "CEA1", "CEA" / No results found for: "CEA1", "CEA" No results found for: "PSA1" No results found for: "[id number]" No results found for: "CAN125"  No results found for: "TOTALPROTELP", "ALBUMINELP", "A1GS", "A2GS", "BETS", "BETA2SER", "GAMS", "MSPIKE", "SPEI" Lab Results  Component Value Date   TIBC 352 06/22/2022   FERRITIN 75 06/22/2022   IRONPCTSAT 24 06/22/2022   Lab Results  Component Value Date   LDH 120 06/22/2022     STUDIES:   No results found.

## 2022-06-22 ENCOUNTER — Inpatient Hospital Stay: Payer: Medicaid Other

## 2022-06-22 ENCOUNTER — Other Ambulatory Visit: Payer: Self-pay

## 2022-06-22 ENCOUNTER — Ambulatory Visit (HOSPITAL_COMMUNITY)
Admission: RE | Admit: 2022-06-22 | Discharge: 2022-06-22 | Disposition: A | Discharge status: Home / Self Care | Payer: Medicaid Other | Source: Ambulatory Visit | Attending: Hematology | Admitting: Hematology

## 2022-06-22 ENCOUNTER — Inpatient Hospital Stay: Payer: Medicaid Other | Attending: Hematology | Admitting: Hematology

## 2022-06-22 VITALS — BP 109/73 | HR 67 | Temp 98.2°F | Resp 18 | Ht 71.0 in | Wt 210.7 lb

## 2022-06-22 DIAGNOSIS — Z87891 Personal history of nicotine dependence: Secondary | ICD-10-CM | POA: Diagnosis not present

## 2022-06-22 DIAGNOSIS — R5383 Other fatigue: Secondary | ICD-10-CM | POA: Insufficient documentation

## 2022-06-22 DIAGNOSIS — N182 Chronic kidney disease, stage 2 (mild): Secondary | ICD-10-CM | POA: Diagnosis not present

## 2022-06-22 DIAGNOSIS — Z801 Family history of malignant neoplasm of trachea, bronchus and lung: Secondary | ICD-10-CM | POA: Diagnosis not present

## 2022-06-22 DIAGNOSIS — D649 Anemia, unspecified: Secondary | ICD-10-CM | POA: Insufficient documentation

## 2022-06-22 DIAGNOSIS — D472 Monoclonal gammopathy: Secondary | ICD-10-CM | POA: Insufficient documentation

## 2022-06-22 LAB — CBC WITH DIFFERENTIAL/PLATELET
Abs Immature Granulocytes: 0.02 10*3/uL (ref 0.00–0.07)
Basophils Absolute: 0 10*3/uL (ref 0.0–0.1)
Basophils Relative: 1 %
Eosinophils Absolute: 0.1 10*3/uL (ref 0.0–0.5)
Eosinophils Relative: 2 %
HCT: 38.7 % — ABNORMAL LOW (ref 39.0–52.0)
Hemoglobin: 12.8 g/dL — ABNORMAL LOW (ref 13.0–17.0)
Immature Granulocytes: 0 %
Lymphocytes Relative: 39 %
Lymphs Abs: 2.5 10*3/uL (ref 0.7–4.0)
MCH: 31.8 pg (ref 26.0–34.0)
MCHC: 33.1 g/dL (ref 30.0–36.0)
MCV: 96 fL (ref 80.0–100.0)
Monocytes Absolute: 0.7 10*3/uL (ref 0.1–1.0)
Monocytes Relative: 11 %
Neutro Abs: 3 10*3/uL (ref 1.7–7.7)
Neutrophils Relative %: 47 %
Platelets: 348 10*3/uL (ref 150–400)
RBC: 4.03 MIL/uL — ABNORMAL LOW (ref 4.22–5.81)
RDW: 13.6 % (ref 11.5–15.5)
WBC: 6.5 10*3/uL (ref 4.0–10.5)
nRBC: 0 % (ref 0.0–0.2)

## 2022-06-22 LAB — LACTATE DEHYDROGENASE: LDH: 120 U/L (ref 98–192)

## 2022-06-22 LAB — COMPREHENSIVE METABOLIC PANEL
ALT: 11 U/L (ref 0–44)
AST: 16 U/L (ref 15–41)
Albumin: 3.6 g/dL (ref 3.5–5.0)
Alkaline Phosphatase: 91 U/L (ref 38–126)
Anion gap: 9 (ref 5–15)
BUN: 24 mg/dL — ABNORMAL HIGH (ref 6–20)
CO2: 25 mmol/L (ref 22–32)
Calcium: 8.9 mg/dL (ref 8.9–10.3)
Chloride: 101 mmol/L (ref 98–111)
Creatinine, Ser: 1.68 mg/dL — ABNORMAL HIGH (ref 0.61–1.24)
GFR, Estimated: 46 mL/min — ABNORMAL LOW (ref >60)
Glucose, Bld: 93 mg/dL (ref 70–99)
Potassium: 4.2 mmol/L (ref 3.5–5.1)
Sodium: 135 mmol/L (ref 135–145)
Total Bilirubin: 0.7 mg/dL (ref 0.3–1.2)
Total Protein: 8.5 g/dL — ABNORMAL HIGH (ref 6.5–8.1)

## 2022-06-22 LAB — IRON AND TIBC
Iron: 86 ug/dL (ref 45–182)
Saturation Ratios: 24 % (ref 17.9–39.5)
TIBC: 352 ug/dL (ref 250–450)
UIBC: 266 ug/dL

## 2022-06-22 LAB — FERRITIN: Ferritin: 75 ng/mL (ref 24–336)

## 2022-06-22 NOTE — Patient Instructions (Addendum)
[facility]  Discharge Instructions  You were seen and examined today by Dr. Delton Coombes. Dr. Delton Coombes is a hematologist, meaning that he specializes in blood abnormalities. Dr. Delton Coombes discussed your past medical history, family history of cancers/blood conditions and the events that led to you being here today.  You were referred to Dr. Delton Coombes due to an abnormal protein presence on your recent blood work. This is often associated with a condition known as MGUS (monoclonal gammopathy of unknown significance).  Dr. Delton Coombes has recommended additional labs today and a bone survey to ensure there is no impact on your bones.  Follow-up as scheduled.  Thank you for choosing [facility] to provide your oncology and hematology care.   To afford each patient quality time with our provider, please arrive at least 15 minutes before your scheduled appointment time. You may need to reschedule your appointment if you arrive late (10 or more minutes). Arriving late affects you and other patients whose appointments are after yours.  Also, if you miss three or more appointments without notifying the office, you may be dismissed from the clinic at the provider's discretion.    Again, thank you for choosing [facility].  Our hope is that these requests will decrease the amount of time that you wait before being seen by our physicians.   If you have a lab appointment with the [facility] please come in thru the Main Entrance and check in at the main information desk.           _____________________________________________________________  Should you have questions after your visit to [facility], please contact our office at [phone] and follow the prompts.  Our office hours are 8:00 a.m. to 4:30 p.m. Monday - Thursday and 8:00 a.m. to 2:30 p.m. Friday.  Please note that voicemails left after 4:00 p.m. may not be  returned until the following business day.  We are closed weekends and all major holidays.  You do have access to a nurse 24-7, just call the main number to the clinic [phone] and do not press any options, hold on the line and a nurse will answer the phone.    For prescription refill requests, have your pharmacy contact our office and allow 72 hours.    Masks are optional in the cancer centers. If you would like for your care team to wear a mask while they are taking care of you, please let them know. You may have one support person who is at least 60 years old accompany you for your appointments.

## 2022-06-23 LAB — BETA 2 MICROGLOBULIN, SERUM: Beta-2 Microglobulin: 3.8 mg/L — ABNORMAL HIGH (ref 0.6–2.4)

## 2022-06-25 LAB — KAPPA/LAMBDA LIGHT CHAINS
Kappa free light chain: 20.1 mg/L — ABNORMAL HIGH (ref 3.3–19.4)
Kappa, lambda light chain ratio: 0.12 — ABNORMAL LOW (ref 0.26–1.65)
Lambda free light chains: 167 mg/L — ABNORMAL HIGH (ref 5.7–26.3)

## 2022-06-27 LAB — PROTEIN ELECTROPHORESIS, SERUM
A/G Ratio: 0.9 (ref 0.7–1.7)
Albumin ELP: 3.6 g/dL (ref 2.9–4.4)
Alpha-1-Globulin: 0.2 g/dL (ref 0.0–0.4)
Alpha-2-Globulin: 0.7 g/dL (ref 0.4–1.0)
Beta Globulin: 2.2 g/dL — ABNORMAL HIGH (ref 0.7–1.3)
Gamma Globulin: 0.9 g/dL (ref 0.4–1.8)
Globulin, Total: 4.1 g/dL — ABNORMAL HIGH (ref 2.2–3.9)
M-Spike, %: 1.4 g/dL — ABNORMAL HIGH
Total Protein ELP: 7.7 g/dL (ref 6.0–8.5)

## 2022-06-28 LAB — IMMUNOFIXATION ELECTROPHORESIS
IgA: 160 mg/dL (ref 90–386)
IgG (Immunoglobin G), Serum: 2436 mg/dL — ABNORMAL HIGH (ref 603–1613)
IgM (Immunoglobulin M), Srm: 33 mg/dL (ref 20–172)
Total Protein ELP: 7.7 g/dL (ref 6.0–8.5)

## 2022-07-05 ENCOUNTER — Inpatient Hospital Stay (HOSPITAL_BASED_OUTPATIENT_CLINIC_OR_DEPARTMENT_OTHER): Payer: Medicaid Other | Admitting: Physician Assistant

## 2022-07-05 VITALS — BP 108/69 | HR 72 | Temp 98.4°F | Resp 18 | Ht 71.0 in | Wt 215.8 lb

## 2022-07-05 DIAGNOSIS — C9000 Multiple myeloma not having achieved remission: Secondary | ICD-10-CM | POA: Diagnosis not present

## 2022-07-05 DIAGNOSIS — D472 Monoclonal gammopathy: Secondary | ICD-10-CM | POA: Diagnosis not present

## 2022-07-05 NOTE — Progress Notes (Signed)
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  He was last seen by Dr. Delton Coombes on 06/22/2022.  At today's visit, he reports feeling fair.  He reports ongoing back pain that has been persistent and progressive for the past several months.  He continues to have right-sided knee pain secondary to right TKR (November 2023).  He reports occasional blurry vision.  He denies any abnormal headaches, neuropathy, B symptoms, or lymphadenopathy.  He has 75% energy and 90% appetite. He endorses that he is maintaining a stable weight.  ASSESSMENT & PLAN:  1.  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 Theador Hawthorne - Initial labs from nephrologist (12/22/2021): SPEP with M spike 0.3, immunofixation IgG lambda.  Free light chain ratio low at 0.12, normal kappa 19.2, elevated lambda 163.4.  24-hour urine also showed IgG lambda with 84 mg total protein. - Most recent MGUS/myeloma panel (06/22/2022): M spike has quadrupled to 1.4% (from 0.3% six months ago) Elevated lambda light chains 167.0, normal kappa 20.1, decreased ratio 0.12. Immunofixation = biclonal IgG protein with lambda specificity Normal LDH.  Elevated beta-2 microglobulin 3.8. Hgb 12.8/MCV 96.0, creatinine 1.68/GFR 46, calcium 8.9. - Skeletal survey (06/22/2022): Concerning for multiple myeloma versus metastatic disease with lucencies to the skull, proximal right humerus, left humerus, left clavicle, lower thoracic vertebral body, subcapital region of the left femur,  right tibial diaphysis. - Progressive back pain for the past several months and right-sided knee pain (s/p right TKR in November 2023). Mild fatigue.  No B symptoms or neuropathy. - PLAN: We discussed probable diagnosis of multiple myeloma, including need for additional testing and follow-up with Dr. Delton Coombes soon as possible to establish definitive diagnosis and treatment plan.   2.  Social/family history: - Lives at home with his wife.  He is currently disabled.  Previously worked at a [employer].  Non-smoker. - Maternal uncle had lung cancer.  No family history of multiple myeloma.  PLAN SUMMARY: >> Bone marrow biopsy with cytogenetic analysis, molecular pathology, and myeloma FISH panel requested  >> Whole-body PET scan >> MD visit with DR. KATRAGADDA 1 week after the above tests completed    REVIEW OF SYSTEMS:   Review of Systems  Constitutional:  Positive for fatigue. Negative for appetite change, chills, diaphoresis, fever and unexpected weight change.  HENT:   Negative for lump/mass and nosebleeds.   Eyes:  Negative for eye problems.  Respiratory:  Negative for cough, hemoptysis and shortness of breath.   Cardiovascular:  Negative for chest pain, leg swelling and palpitations.  Gastrointestinal:  Negative for abdominal pain, blood in stool, constipation, diarrhea, nausea and vomiting.  Genitourinary:  Negative for hematuria.   Musculoskeletal:  Positive for arthralgias (Right knee) and back pain.  Skin: Negative.   Neurological:  Negative for dizziness, headaches and light-headedness.  Hematological:  Does not bruise/bleed easily.     PHYSICAL EXAM:  ECOG PERFORMANCE STATUS: 1 - Symptomatic but completely ambulatory  Vitals:   07/05/22 1109  BP: 108/69  Pulse: 72  Resp:  18  Temp: 98.4 F (36.9 C)  SpO2: 99%   Filed Weights   07/05/22 1109  Weight: 215 lb 12.8 oz (97.9 kg)   Physical Exam Constitutional:      Appearance: Normal appearance. He is normal weight.   Cardiovascular:     Heart sounds: Normal heart sounds.  Pulmonary:     Breath sounds: Normal breath sounds.  Neurological:     General: No focal deficit present.     Mental Status: Mental status is at baseline.  Psychiatric:        Behavior: Behavior normal. Behavior is cooperative.    PAST MEDICAL/SURGICAL HISTORY:  Past Medical History:  Diagnosis Date   Alcohol abuse    Arthritis    Bilateral knees    Atrial fibrillation (HCC)    Chronic kidney disease, stage 2, mildly decreased GFR    GERD (gastroesophageal reflux disease)    Gout    Nonischemic cardiomyopathy ([facility])    a. LVEF 20-25% in 2013, improved to 50% on medical therapy. b. 09/2016: echo showing reduced EF of 15-20% and cath showing normal cors.    TIA (transient ischemic attack)    Past Surgical History:  Procedure Laterality Date   Arthroscopic knee surgery Right 1987   CARDIOVERSION  10/08/2011   Procedure: CARDIOVERSION;  Surgeon: Satira Sark, MD;  Location: [facility];  Service: Cardiovascular;  Laterality: N/A;  Done in PACU   CARDIOVERSION  01/10/2012   Procedure: CARDIOVERSION;  Surgeon: Evans Lance, MD;  Location: [facility];  Service: Cardiovascular;  Laterality: N/A;   CARDIOVERSION N/A 11/17/2014   Procedure: CARDIOVERSION;  Surgeon: Herminio Commons, MD;  Location: [facility];  Service: Cardiovascular;  Laterality: N/A;   RIGHT/LEFT HEART CATH AND CORONARY ANGIOGRAPHY N/A 10/02/2016   Procedure: Right/Left Heart Cath and Coronary Angiography;  Surgeon: Troy Sine, MD;  Location: [facility];  Service: Cardiovascular;  Laterality: N/A;   TEE WITHOUT CARDIOVERSION N/A 11/17/2014   Procedure: TRANSESOPHAGEAL ECHOCARDIOGRAM (TEE);  Surgeon: Herminio Commons, MD;  Location: [facility];  Service: Cardiovascular;  Laterality: N/A;   TEE WITHOUT CARDIOVERSION N/A 10/09/2016   Procedure: TRANSESOPHAGEAL ECHOCARDIOGRAM (TEE);  Surgeon: Acie Fredrickson Wonda Cheng, MD;  Location: [facility];  Service:  Cardiovascular;  Laterality: N/A;   TOTAL KNEE ARTHROPLASTY Left 01/16/2013   Procedure: TOTAL KNEE ARTHROPLASTY;  Surgeon: Carole Civil, MD;  Location: [facility];  Service: Orthopedics;  Laterality: Left;    SOCIAL HISTORY:  Social History   Socioeconomic History   Marital status: Married    Spouse name: Not on file   Number of children: Not on file   Years of education: Not on file   Highest education level: Not on file  Occupational History   Not on file  Tobacco Use   Smoking status: Never   Smokeless tobacco: Former    Types: Snuff   Tobacco comments:    quit 1980's  Vaping Use   Vaping Use: Never used  Substance and Sexual Activity   Alcohol use: Yes    Alcohol/week: 14.0 standard drinks of alcohol    Types: 14 Cans of beer per week    Comment: 1-2 beers daily    Drug use: Not Currently    Types: Marijuana    Comment: last time 2 years ago   Sexual activity: Yes    Partners: Male    Birth control/protection: None  Other Topics Concern   Not on file  Social History Narrative   Lives  in [place] with his sister   [employer] Has off from [employer].   Social Determinants of Health   Financial Resource Strain: Not on file  Food Insecurity: Food Insecurity Present (06/22/2022)   Hunger Vital Sign    Worried About Running Out of Food in the Last Year: Sometimes true    Ran Out of Food in the Last Year: Sometimes true  Transportation Needs: Unmet Transportation Needs (06/22/2022)   PRAPARE - Hydrologist (Medical): Yes    Lack of Transportation (Non-Medical): Yes  Physical Activity: Not on file  Stress: Not on file  Social Connections: Not on file  Intimate Partner Violence: Not At Risk (06/22/2022)   Humiliation, Afraid, Rape, and Kick questionnaire    Fear of Current or Ex-Partner: No    Emotionally Abused: No    Physically Abused: No    Sexually Abused: No    FAMILY HISTORY:  Family History  Problem Relation Age of Onset    Hypertension Mother    Hypertension Sister    Hypertension Brother     CURRENT MEDICATIONS:  Outpatient Encounter Medications as of 07/05/2022  Medication Sig   allopurinol (ZYLOPRIM) 300 MG tablet Take 1 tablet by mouth once daily   apixaban (ELIQUIS) 5 MG TABS tablet Take 1 tablet (5 mg total) by mouth 2 (two) times daily.   cholecalciferol (VITAMIN D3) 10 MCG (400 UNIT) TABS tablet Take 800 Units by mouth daily.   famotidine (PEPCID) 20 MG tablet Take 1 tablet (20 mg total) by mouth 2 (two) times daily.   furosemide (LASIX) 40 MG tablet Take 1 tablet by mouth daily.   metoprolol succinate (TOPROL-XL) 50 MG 24 hr tablet TAKE 1 TABLET BY MOUTH ONCE DAILY WITH  OR  IMMEDIATELY  FOLLOWING  A  MEAL   potassium chloride SA (KLOR-CON) 20 MEQ tablet Take 1 tablet (20 mEq total) by mouth 2 (two) times daily.   sacubitril-valsartan (ENTRESTO) 24-26 MG Take 1 tablet by mouth 2 (two) times daily.   No facility-administered encounter medications on file as of 07/05/2022.    ALLERGIES:  Allergies  Allergen Reactions   Azithromycin Itching    LABORATORY DATA:  I have reviewed the labs as listed.  CBC    Component Value Date/Time   WBC 6.5 06/22/2022 1023   RBC 4.03 (L) 06/22/2022 1023   HGB 12.8 (L) 06/22/2022 1023   HCT 38.7 (L) 06/22/2022 1023   PLT 348 06/22/2022 1023   MCV 96.0 06/22/2022 1023   MCH 31.8 06/22/2022 1023   MCHC 33.1 06/22/2022 1023   RDW 13.6 06/22/2022 1023   LYMPHSABS 2.5 06/22/2022 1023   MONOABS 0.7 06/22/2022 1023   EOSABS 0.1 06/22/2022 1023   BASOSABS 0.0 06/22/2022 1023      Latest Ref Rng & Units 06/22/2022   10:23 AM 06/11/2022    9:27 AM 05/25/2020    1:50 PM  CMP  Glucose 70 - 99 mg/dL 93  108  93   BUN 6 - 20 mg/dL 24  25  14    Creatinine 0.61 - 1.24 mg/dL 1.68  1.49  1.21   Sodium 135 - 145 mmol/L 135  136  131   Potassium 3.5 - 5.1 mmol/L 4.2  4.1  3.8   Chloride 98 - 111 mmol/L 101  100  101   CO2 22 - 32 mmol/L 25  26  22    Calcium 8.9 -  10.3 mg/dL 8.9  9.0  9.1   Total  Protein 6.5 - 8.1 g/dL 8.5     Total Bilirubin 0.3 - 1.2 mg/dL 0.7     Alkaline Phos 38 - 126 U/L 91     AST 15 - 41 U/L 16     ALT 0 - 44 U/L 11       DIAGNOSTIC IMAGING:  I have independently reviewed the relevant imaging and discussed with the patient.   WRAP UP:  All questions were answered. The patient knows to call the clinic with any problems, questions or concerns.  Medical decision making: Moderate  Time spent on visit: I spent 20 minutes counseling the patient face to face. The total time spent in the appointment was 30 minutes and more than 50% was on counseling.  Harriett Rush, PA-C  07/05/2022 12:44 PM

## 2022-07-05 NOTE — Patient Instructions (Signed)
[facility] [place] [facility] **   You were seen today by Tarri Abernethy PA-C for your follow-up visit.  As we discussed, you most likely have a type of cancer called multiple myeloma.  In order to confirm this diagnosis, we will check the following tests: PET scan Bone marrow biopsy You will see Dr. Delton Coombes after these tests have been completed.  He will give you more information about your diagnosis and discussed various treatment options with you.  Please see the attached handout for further information, but keep in mind that for the time being the multiple myeloma is our suspected diagnosis, and we are unable to confirm this until testing has been completed.  ** Thank you for trusting me with your healthcare!  I strive to provide all of my patients with quality care at each visit.  If you receive a survey for this visit, I would be so grateful to you for taking the time to provide feedback.  Thank you in advance!  ~ Aikeem Lilley                   Dr. Derek Jack   &   Tarri Abernethy, PA-C   - - - - - - - - - - - - - - - - - -    Thank you for choosing [facility] to provide your oncology and hematology care.  To afford each patient quality time with our provider, please arrive at least 15 minutes before your scheduled appointment time.   If you have a lab appointment with the [facility] please come in thru the Main Entrance and check in at the main information desk.  You need to re-schedule your appointment should you arrive 10 or more minutes late.  We strive to give you quality time with our providers, and arriving late affects you and other patients whose appointments are after yours.  Also, if you no show three or more times for appointments you may be dismissed from the clinic at the providers discretion.     Again, thank you for choosing [facility].  Our hope  is that these requests will decrease the amount of time that you wait before being seen by our physicians.       _____________________________________________________________  Should you have questions after your visit to [facility], please contact our office at [phone] and follow the prompts.  Our office hours are 8:00 a.m. and 4:30 p.m. Monday - Friday.  Please note that voicemails left after 4:00 p.m. may not be returned until the following business day.  We are closed weekends and major holidays.  You do have access to a nurse 24-7, just call the main number to the clinic [phone] and do not press any options, hold on the line and a nurse will answer the phone.    For prescription refill requests, have your pharmacy contact our office and allow 72 hours.

## 2022-07-12 ENCOUNTER — Ambulatory Visit (HOSPITAL_COMMUNITY)
Admission: RE | Admit: 2022-07-12 | Discharge: 2022-07-12 | Disposition: A | Discharge status: Home / Self Care | Payer: Medicaid Other | Source: Ambulatory Visit | Attending: Physician Assistant | Admitting: Physician Assistant

## 2022-07-12 DIAGNOSIS — C9000 Multiple myeloma not having achieved remission: Secondary | ICD-10-CM | POA: Diagnosis present

## 2022-07-12 MED ORDER — FLUDEOXYGLUCOSE F - 18 (FDG) INJECTION
11.6300 | Freq: Once | INTRAVENOUS | Status: AC | PRN
  Administered 2022-07-12: 11.63 via INTRAVENOUS

## 2022-07-19 ENCOUNTER — Other Ambulatory Visit (HOSPITAL_COMMUNITY): Payer: Medicaid Other

## 2022-07-24 ENCOUNTER — Other Ambulatory Visit: Payer: Self-pay | Admitting: Radiology

## 2022-07-24 ENCOUNTER — Other Ambulatory Visit: Payer: Self-pay | Admitting: Student

## 2022-07-24 DIAGNOSIS — C9000 Multiple myeloma not having achieved remission: Secondary | ICD-10-CM

## 2022-07-25 ENCOUNTER — Encounter (HOSPITAL_COMMUNITY): Payer: Self-pay

## 2022-07-25 ENCOUNTER — Ambulatory Visit (HOSPITAL_COMMUNITY)
Admission: RE | Admit: 2022-07-25 | Discharge: 2022-07-25 | Disposition: A | Discharge status: Home / Self Care | Payer: Medicaid Other | Source: Ambulatory Visit | Attending: Physician Assistant | Admitting: Physician Assistant

## 2022-07-25 DIAGNOSIS — M109 Gout, unspecified: Secondary | ICD-10-CM | POA: Diagnosis not present

## 2022-07-25 DIAGNOSIS — I428 Other cardiomyopathies: Secondary | ICD-10-CM | POA: Diagnosis not present

## 2022-07-25 DIAGNOSIS — N189 Chronic kidney disease, unspecified: Secondary | ICD-10-CM | POA: Diagnosis not present

## 2022-07-25 DIAGNOSIS — C9000 Multiple myeloma not having achieved remission: Secondary | ICD-10-CM | POA: Insufficient documentation

## 2022-07-25 DIAGNOSIS — I4891 Unspecified atrial fibrillation: Secondary | ICD-10-CM | POA: Diagnosis not present

## 2022-07-25 DIAGNOSIS — Z8673 Personal history of transient ischemic attack (TIA), and cerebral infarction without residual deficits: Secondary | ICD-10-CM | POA: Diagnosis not present

## 2022-07-25 DIAGNOSIS — K219 Gastro-esophageal reflux disease without esophagitis: Secondary | ICD-10-CM | POA: Insufficient documentation

## 2022-07-25 DIAGNOSIS — D649 Anemia, unspecified: Secondary | ICD-10-CM | POA: Diagnosis not present

## 2022-07-25 HISTORY — PX: IR BONE MARROW BIOPSY & ASPIRATION: IMG5727

## 2022-07-25 LAB — CBC WITH DIFFERENTIAL/PLATELET
Abs Immature Granulocytes: 0.01 10*3/uL (ref 0.00–0.07)
Basophils Absolute: 0 10*3/uL (ref 0.0–0.1)
Basophils Relative: 1 %
Eosinophils Absolute: 0.1 10*3/uL (ref 0.0–0.5)
Eosinophils Relative: 2 %
HCT: 40.6 % (ref 39.0–52.0)
Hemoglobin: 12.9 g/dL — ABNORMAL LOW (ref 13.0–17.0)
Immature Granulocytes: 0 %
Lymphocytes Relative: 39 %
Lymphs Abs: 2.4 10*3/uL (ref 0.7–4.0)
MCH: 31.2 pg (ref 26.0–34.0)
MCHC: 31.8 g/dL (ref 30.0–36.0)
MCV: 98.1 fL (ref 80.0–100.0)
Monocytes Absolute: 0.9 10*3/uL (ref 0.1–1.0)
Monocytes Relative: 15 %
Neutro Abs: 2.7 10*3/uL (ref 1.7–7.7)
Neutrophils Relative %: 43 %
Platelets: 336 10*3/uL (ref 150–400)
RBC: 4.14 MIL/uL — ABNORMAL LOW (ref 4.22–5.81)
RDW: 13.6 % (ref 11.5–15.5)
WBC: 6.1 10*3/uL (ref 4.0–10.5)
nRBC: 0 % (ref 0.0–0.2)

## 2022-07-25 MED ORDER — DIPHENHYDRAMINE HCL 50 MG/ML IJ SOLN
INTRAMUSCULAR | Status: AC
  Filled 2022-07-25: qty 1

## 2022-07-25 MED ORDER — MIDAZOLAM HCL 2 MG/2ML IJ SOLN
INTRAMUSCULAR | Status: AC | PRN
  Administered 2022-07-25: 1 mg via INTRAVENOUS
  Administered 2022-07-25: .5 mg via INTRAVENOUS
  Administered 2022-07-25: 1.5 mg via INTRAVENOUS

## 2022-07-25 MED ORDER — SODIUM CHLORIDE 0.9 % IV SOLN: INTRAVENOUS | Status: DC

## 2022-07-25 MED ORDER — LIDOCAINE HCL (PF) 1 % IJ SOLN
INTRAMUSCULAR | Status: AC
  Filled 2022-07-25: qty 30

## 2022-07-25 MED ORDER — LIDOCAINE HCL (PF) 1 % IJ SOLN
20.0000 mL | Freq: Once | INTRAMUSCULAR | Status: AC
  Administered 2022-07-25: 20 mL via INTRADERMAL

## 2022-07-25 MED ORDER — FENTANYL CITRATE (PF) 100 MCG/2ML IJ SOLN
INTRAMUSCULAR | Status: AC
  Filled 2022-07-25: qty 2

## 2022-07-25 MED ORDER — DIPHENHYDRAMINE HCL 50 MG/ML IJ SOLN
INTRAMUSCULAR | Status: AC | PRN
  Administered 2022-07-25: 50 mg via INTRAVENOUS

## 2022-07-25 MED ORDER — FENTANYL CITRATE (PF) 100 MCG/2ML IJ SOLN
INTRAMUSCULAR | Status: AC | PRN
  Administered 2022-07-25: 25 ug via INTRAVENOUS
  Administered 2022-07-25: 75 ug via INTRAVENOUS

## 2022-07-25 MED ORDER — MIDAZOLAM HCL 2 MG/2ML IJ SOLN
INTRAMUSCULAR | Status: AC
  Filled 2022-07-25: qty 2

## 2022-07-25 NOTE — Discharge Instructions (Signed)
Bone Marrow Aspiration and Bone Marrow Biopsy, Adult, Care After  The following information offers guidance on how to care for yourself after your procedure. Your health care provider may also give you more specific instructions. If you have problems or questions, contact your health care provider.  What can I expect after the procedure?  May remove dressing or bandaid and shower tomorrow.  Keep site clean and dry. Replace with clean dressing or bandaid as necessary. Urgent needs IR clinic [phone] (mon-fri 8-5).  After the procedure, it is common to have: Mild pain and tenderness. Swelling. Bruising. Follow these instructions at home: Incision care  Follow instructions from your health care provider about how to take care of the incision site. Make sure you: Wash your hands with soap and water for at least 20 seconds before and after you change your bandage (dressing). If soap and water are not available, use hand sanitizer. Change your dressing as told by your health care provider. Leave stitches (sutures), skin glue, or adhesive strips in place. These skin closures may need to stay in place for 2 weeks or longer. If adhesive strip edges start to loosen and curl up, you may trim the loose edges. Do not remove adhesive strips completely unless your health care provider tells you to do that. Check your incision site every day for signs of infection. Check for: More redness, swelling, or pain. Fluid or blood. Warmth. Pus or a bad smell. Activity Return to your normal activities as told by your health care provider. Ask your health care provider what activities are safe for you. Do not lift anything that is heavier than 10 lb (4.5 kg), or the limit that you are told, until your health care provider says that it is safe. If you were given a sedative during the procedure, it can affect you for  several hours. Do not drive or operate machinery until your health care provider says that it is safe. General instructions  Take over-the-counter and prescription medicines only as told by your health care provider. Do not take baths, swim, or use a hot tub until your health care provider approves. Ask your health care provider if you may take showers. You may only be allowed to take sponge baths. If directed, put ice on the affected area. To do this: Put ice in a plastic bag. Place a towel between your skin and the bag. Leave the ice on for 20 minutes, 2-3 times a day. If your skin turns bright red, remove the ice right away to prevent skin damage. The risk of skin damage is higher if you cannot feel pain, heat, or cold. Contact a health care provider if: You have signs of infection. Your pain is not controlled with medicine. You have cancer, and a temperature of 100.4F (38C) or higher. Get help right away if: You have a temperature of 101F (38.3C) or higher, or as told by your health care provider. You have bleeding from the incision site that cannot be controlled. This information is not intended to replace advice given to you by your health care provider. Make sure you discuss any questions you have with your health care provider. Document Revised: 07/31/2021 Document Reviewed: 07/31/2021 Elsevier Patient Education  2023 Elsevier Inc.                            Moderate Conscious Sedation, Adult, Care After  This sheet gives you information about how to care   for yourself after your procedure. Your health care provider may also give you more specific instructions. If you have problems or questions, contact your health care provider. What can I expect after the procedure? After the procedure, it is common to have: Sleepiness for several hours. Impaired judgment for several hours. Difficulty with balance. Vomiting if you eat too soon. Follow these instructions at home: For  the time period you were told by your health care provider:     Rest. Do not participate in activities where you could fall or become injured. Do not drive or use machinery. Do not drink alcohol. Do not take sleeping pills or medicines that cause drowsiness. Do not make important decisions or sign legal documents. Do not take care of children on your own. Eating and drinking  Follow the diet recommended by your health care provider. Drink enough fluid to keep your urine pale yellow. If you vomit: Drink water, juice, or soup when you can drink without vomiting. Make sure you have little or no nausea before eating solid foods. General instructions Take over-the-counter and prescription medicines only as told by your health care provider. Have a responsible adult stay with you for the time you are told. It is important to have someone help care for you until you are awake and alert. Do not smoke. Keep all follow-up visits as told by your health care provider. This is important. Contact a health care provider if: You are still sleepy or having trouble with balance after 24 hours. You feel light-headed. You keep feeling nauseous or you keep vomiting. You develop a rash. You have a fever. You have redness or swelling around the IV site. Get help right away if: You have trouble breathing. You have new-onset confusion at home. Summary After the procedure, it is common to feel sleepy, have impaired judgment, or feel nauseous if you eat too soon. Rest after you get home. Know the things you should not do after the procedure. Follow the diet recommended by your health care provider and drink enough fluid to keep your urine pale yellow. Get help right away if you have trouble breathing or new-onset confusion at home. This information is not intended to replace advice given to you by your health care provider. Make sure you discuss any questions you have with your health care  provider. Document Revised: 07/24/2019 Document Reviewed: 02/19/2019 Elsevier Patient Education  2023 Elsevier Inc.  

## 2022-07-25 NOTE — Consult Note (Signed)
Chief Complaint: Patient was seen in consultation today for  image guided bone marrow biopsy  Referring Physician(s): Katragadda,S  Supervising Physician: Richarda Overlie  Patient Status: [facility] - Out-pt  History of Present Illness: David [name] is a 62 y.o. male with PMH sig for arthritis, afib, CKD, GERD, gout, NICM, TIA , alcohol abuse and MGUS who presents now with likely progression to multiple myeloma. He is scheduled today for image guided bone marrow biopsy for further evaluation.   Past Medical History:  Diagnosis Date   Alcohol abuse    Arthritis    Bilateral knees    Atrial fibrillation    Chronic kidney disease, stage 2, mildly decreased GFR    GERD (gastroesophageal reflux disease)    Gout    Nonischemic cardiomyopathy    a. LVEF 20-25% in 2013, improved to 50% on medical therapy. b. 09/2016: echo showing reduced EF of 15-20% and cath showing normal cors.    TIA (transient ischemic attack)     Past Surgical History:  Procedure Laterality Date   Arthroscopic knee surgery Right 1987   CARDIOVERSION  10/08/2011   Procedure: CARDIOVERSION;  Surgeon: Jonelle Sidle, MD;  Location: [facility] ORS;  Service: Cardiovascular;  Laterality: N/A;  Done in PACU   CARDIOVERSION  01/10/2012   Procedure: CARDIOVERSION;  Surgeon: Marinus Maw, MD;  Location: [facility] ENDOSCOPY;  Service: Cardiovascular;  Laterality: N/A;   CARDIOVERSION N/A 11/17/2014   Procedure: CARDIOVERSION;  Surgeon: Laqueta Linden, MD;  Location: [facility] ORS;  Service: Cardiovascular;  Laterality: N/A;   RIGHT/LEFT HEART CATH AND CORONARY ANGIOGRAPHY N/A 10/02/2016   Procedure: Right/Left Heart Cath and Coronary Angiography;  Surgeon: Lennette Bihari, MD;  Location: [facility] INVASIVE CV LAB;  Service: Cardiovascular;  Laterality: N/A;   TEE WITHOUT CARDIOVERSION N/A 11/17/2014   Procedure: TRANSESOPHAGEAL ECHOCARDIOGRAM (TEE);  Surgeon: Laqueta Linden, MD;  Location: [facility] OR[facility];  Service: Cardiovascular;  Laterality: N/A;    TEE WITHOUT CARDIOVERSION N/A 10/09/2016   Procedure: TRANSESOPHAGEAL ECHOCARDIOGRAM (TEE);  Surgeon: Elease Hashimoto Deloris Ping, MD;  Location: [facility] ENDOSCOPY;  Service: Cardiovascular;  Laterality: N/A;   TOTAL KNEE ARTHROPLASTY Left 01/16/2013   Procedure: TOTAL KNEE ARTHROPLASTY;  Surgeon: Vickki Hearing, MD;  Location: [facility] ORS;  Service: Orthopedics;  Laterality: Left;    Allergies: Azithromycin  Medications: Prior to Admission medications   Medication Sig Start Date End Date Taking? Authorizing Provider  allopurinol (ZYLOPRIM) 300 MG tablet Take 1 tablet by mouth once daily 08/08/20  Yes Darreld Mclean, MD  apixaban (ELIQUIS) 5 MG TABS tablet Take 1 tablet (5 mg total) by mouth 2 (two) times daily. 11/17/19  Yes Strader, [place] M, PA-C  cholecalciferol (VITAMIN D3) 10 MCG (400 UNIT) TABS tablet Take 800 Units by mouth daily.   Yes [provider]  famotidine (PEPCID) 20 MG tablet Take 1 tablet (20 mg total) by mouth 2 (two) times daily. 10/22/17  Yes Devoria Albe, MD  furosemide (LASIX) 40 MG tablet Take 1 tablet by mouth daily. 04/04/20  Yes [provider]  metoprolol succinate (TOPROL-XL) 50 MG 24 hr tablet TAKE 1 TABLET BY MOUTH ONCE DAILY WITH  OR  IMMEDIATELY  FOLLOWING  A  MEAL 01/18/20  Yes Jonelle Sidle, MD  Multiple Vitamin (MULTIVITAMIN) capsule Take 1 capsule by mouth daily.   Yes [provider]  potassium chloride SA (KLOR-CON) 20 MEQ tablet Take 1 tablet (20 mEq total) by mouth 2 (two) times daily. 01/28/20  Yes Strader, Lennart Pall,  PA-C  sacubitril-valsartan (ENTRESTO) 24-26 MG Take 1 tablet by mouth 2 (two) times daily. 02/20/22  Yes Jonelle Sidle, MD     Family History  Problem Relation Age of Onset   Hypertension Mother    Hypertension Sister    Hypertension Brother     Social History   Socioeconomic History   Marital status: Married    Spouse name: Not on file   Number of children: Not on file   Years of education: Not on file    Highest education level: Not on file  Occupational History   Not on file  Tobacco Use   Smoking status: Never   Smokeless tobacco: Former    Types: Snuff   Tobacco comments:    quit 1980's  Vaping Use   Vaping Use: Never used  Substance and Sexual Activity   Alcohol use: Yes    Alcohol/week: 14.0 standard drinks of alcohol    Types: 14 Cans of beer per week    Comment: 1-2 beers daily    Drug use: Not Currently    Types: Marijuana    Comment: last time 2 years ago   Sexual activity: Yes    Partners: Male    Birth control/protection: None  Other Topics Concern   Not on file  Social History Narrative   Lives in [place] with his sister   Laid off from [employer].   Social Determinants of Health   Financial Resource Strain: Not on file  Food Insecurity: Food Insecurity Present (06/22/2022)   Hunger Vital Sign    Worried About Running Out of Food in the Last Year: Sometimes true    Ran Out of Food in the Last Year: Sometimes true  Transportation Needs: Unmet Transportation Needs (06/22/2022)   PRAPARE - Administrator, Civil Service (Medical): Yes    Lack of Transportation (Non-Medical): Yes  Physical Activity: Not on file  Stress: Not on file  Social Connections: Not on file      Review of Systems denies fever,HA,CP,dyspnea, cough, abd pain,N/V or bleeding; he does have chronic back pain  Vital Signs: Ht  (1.803 m)   Wt 215 lb 12.8 oz (97.9 kg)   BMI 30.10 kg/m   Code Status: FULL CODE  Physical Exam; awake/alert; chest- CTA bilat; heart- nl rate, irreg rhythm; abd- soft,+BS,NT; no LE edema  Imaging: NM PET Image Initial (PI) Whole Body  Result Date: 07/13/2022 CLINICAL DATA:  Initial treatment strategy for multiple myeloma. EXAM: NUCLEAR MEDICINE PET WHOLE BODY TECHNIQUE: 11.6 mCi F-18 FDG was injected intravenously. Full-ring PET imaging was performed from the head to foot after the radiotracer. CT data was obtained and used for  attenuation correction and anatomic localization. Fasting blood glucose: 6 110 mg/dl COMPARISON:  None Available. FINDINGS: Mediastinal blood pool activity: SUV max 2.9 HEAD/NECK: No hypermetabolic activity in the scalp. No hypermetabolic cervical lymph nodes. Incidental CT findings: none CHEST: No hypermetabolic mediastinal or hilar nodes. No suspicious pulmonary nodules on the CT scan. Incidental CT findings: Cardiomegaly. Mild atherosclerotic calcifications of the aortic arch. ABDOMEN/PELVIS: No abnormal hypermetabolic activity within the liver, pancreas, adrenal glands, or spleen. No hypermetabolic lymph nodes in the abdomen or pelvis. Incidental CT findings: Mild atherosclerotic calcifications of the abdominal aorta. SKELETON: No focal hypermetabolic activity to suggest active myeloma. No suspicious lytic lesions on CT. Incidental CT findings: Mild degenerative changes of the lumbar spine. EXTREMITIES: No abnormal hypermetabolic activity in the lower extremities. Incidental CT findings: Bilateral knee arthroplasties.  IMPRESSION: No findings suspicious for active myeloma. Electronically Signed   By: Charline Bills M.D.   On: 07/13/2022 04:03    Labs:  CBC: Recent Labs    06/11/22 0927 06/22/22 1023  WBC 7.5 6.5  HGB 13.1 12.8*  HCT 40.3 38.7*  PLT 341 348    COAGS: No results for input(s): "INR", "APTT" in the last 8760 hours.  BMP: Recent Labs    06/11/22 0927 06/22/22 1023  NA 136 135  K 4.1 4.2  CL 100 101  CO2 26 25  GLUCOSE 108* 93  BUN 25* 24*  CALCIUM 9.0 8.9  CREATININE 1.49* 1.68*  GFRNONAA 53* 46*    LIVER FUNCTION TESTS: Recent Labs    06/11/22 0927 06/22/22 1023  BILITOT  --  0.7  AST  --  16  ALT  --  11  ALKPHOS  --  91  PROT  --  8.5*  ALBUMIN 3.7 3.6    TUMOR MARKERS: No results for input(s): "AFPTM", "CEA", "CA199", "CHROMGRNA" in the last 8760 hours.  Assessment and Plan: 62 y.o. male with PMH sig for arthritis, afib, CKD, GERD, gout, NICM,  TIA , alcohol abuse and MGUS who presents now with likely progression to multiple myeloma. He is scheduled today for image guided bone marrow biopsy for further evaluation. Risks and benefits of procedure was discussed with the patient  including, but not limited to bleeding, infection, damage to adjacent structures or low yield requiring additional tests.  All of the questions were answered and there is agreement to proceed.  Consent signed and in chart.    Thank you for this interesting consult.  I greatly enjoyed meeting David [name] and look forward to participating in their care.  A copy of this report was sent to the requesting provider on this date.  Electronically Signed: D. Jeananne Rama, PA-C 07/25/2022, 8:05 AM   I spent a total of  20 minutes  in face to face in clinical consultation, greater than 50% of which was counseling/coordinating care for image guided bone marrow biopsy

## 2022-07-25 NOTE — Procedures (Signed)
Interventional Radiology Procedure:   Indications:   IgG lambda MGUS w/ likely progression to Multiple Myeloma   Procedure: Fluoroscopic guided bone marrow biopsy  Findings: 3 aspirates and 1 core from right ilium  Complications: None     EBL: Minimal, less than 10 ml  Plan: Discharge to home in one hour.   Ashley Montminy R. Lowella Dandy, MD  Pager: [phone]

## 2022-07-27 LAB — SURGICAL PATHOLOGY

## 2022-07-30 NOTE — Progress Notes (Signed)
[facility] [address], [place] [address]    Clinic Day:  07/31/2022  Referring physician: [facility]  Patient Care Team: [facility] as PCP - General (General Practice) Jonelle Sidle, MD as PCP - Cardiology (Cardiology) Doreatha Massed, MD as Medical Oncologist (Hematology)   ASSESSMENT & PLAN:   Assessment: 1.  IgG lambda monoclonal gammopathy: - Patient seen at the request of Dr. Wolfgang Phoenix - 01/01/2022: SPEP-0.3 g M spike, kappa-19.2, lambda-163.4, ratio 0.12.  IFE-IgG lambda.  24-hour urine IFE-IgG lambda.  24-hour urine protein 84 mg.  Hb-12.7, creatinine-1.44, calcium-9.3, albumin-3.7 - History of right knee TKR on 02/26/2022. - Skeletal survey (06/22/2022): Concerning for multiple myeloma versus metastatic disease with lucencies to the skull, proximal right humerus, left humerus, left clavicle, lower thoracic vertebral body, subcapital region of the left femur, right tibial diaphysis. - Progressive back pain for the past several months and right-sided knee pain (s/p right TKR in November 2023). Mild fatigue.  No B symptoms or neuropathy.   2.  Social/family history: - Lives at home with his wife.  He is currently disabled.  Previously worked at a [employer].  Non-smoker. - Maternal uncle had lung cancer.  No family history of multiple myeloma.   Plan: 1. IgG lambda MGUS:  - Myeloma labs on 06/22/2022 showed M spike increased to 1.4 g.  Lambda light chains 167 and ratio of 0.12. - BMBx (hypercellular marrow with 9% plasma cells. - We will follow-up on cytogenetics and FISH panel. - Reviewed PET scan from 07/12/2022: No lytic lesions. - We discussed normal prognosis of MGUS with 1% of patients progressing to develop multiple myeloma per year. - Recommended close follow-up in 4 months with repeat SPEP, free light chains.  2.  Mild normocytic anemia: - Likely from combination of CKD and functional iron deficiency. - Will check  ferritin and iron panel at next visit.  Orders Placed This Encounter  Procedures   CBC with Differential    Standing Status:   Future    Standing Expiration Date:   07/31/2023   Comprehensive metabolic panel    Standing Status:   Future    Standing Expiration Date:   07/31/2023   Iron and TIBC ([facility] ONLY)    Standing Status:   Future    Standing Expiration Date:   07/31/2023   Ferritin    Standing Status:   Future    Standing Expiration Date:   07/31/2023   Protein electrophoresis, serum    Standing Status:   Future    Standing Expiration Date:   07/31/2023   Kappa/lambda light chains    Standing Status:   Future    Standing Expiration Date:   07/31/2023   Lactate dehydrogenase    Standing Status:   Future    Standing Expiration Date:   07/31/2023      I,Katie Daubenspeck,acting as a scribe for Doreatha Massed, MD.,have documented all relevant documentation on the behalf of Doreatha Massed, MD,as directed by  Doreatha Massed, MD while in the presence of Doreatha Massed, MD.   I, Doreatha Massed MD, have reviewed the above documentation for accuracy and completeness, and I agree with the above.   Doreatha Massed, MD   4/23/20245:54 PM  CHIEF COMPLAINT:   Diagnosis: IgG lambda MGUS    Cancer Staging  No matching staging information was found for the patient.   Prior Therapy: none  Current Therapy: Observation   HISTORY OF PRESENT ILLNESS:  Oncology History   No history exists.     INTERVAL HISTORY:   David [name] is a 62 y.o. [name] presenting to clinic today for follow up of IgG lambda MGUS. He was last seen by PA Rebekah on 07/05/22.  Since his last visit, he underwent staging PET scan on 07/12/22 showing: no findings suspicious for active myeloma.  He also underwent bone marrow biopsy on 07/25/22. Pathology showed: hypercellular marrow with plasma cell neoplasm (9% of cells). Cytogenetics and FISH are pending.  Today, he  states that he is doing well overall. His appetite level is at 100%. His energy level is at 80%.  PAST MEDICAL HISTORY:   Past Medical History: Past Medical History:  Diagnosis Date   Alcohol abuse    Arthritis    Bilateral knees    Atrial fibrillation    Chronic kidney disease, stage 2, mildly decreased GFR    GERD (gastroesophageal reflux disease)    Gout    Nonischemic cardiomyopathy    a. LVEF 20-25% in 2013, improved to 50% on medical therapy. b. 09/2016: echo showing reduced EF of 15-20% and cath showing normal cors.    TIA (transient ischemic attack)     Surgical History: Past Surgical History:  Procedure Laterality Date   Arthroscopic knee surgery Right 1987   CARDIOVERSION  10/08/2011   Procedure: CARDIOVERSION;  Surgeon: Jonelle Sidle, MD;  Location: [facility]S;  Service: Cardiovascular;  Laterality: N/A;  Done in PACU   CARDIOVERSION  01/10/2012   Procedure: CARDIOVERSION;  Surgeon: Marinus Maw, MD;  Location: [facility] ENDOSCOPY;  Service: Cardiovascular;  Laterality: N/A;   CARDIOVERSION N/A 11/17/2014   Procedure: CARDIOVERSION;  Surgeon: Laqueta Linden, MD;  Location: [facility] ORS;  Service: Cardiovascular;  Laterality: N/A;   IR BONE MARROW BIOPSY & ASPIRATION  07/25/2022   RIGHT/LEFT HEART CATH AND CORONARY ANGIOGRAPHY N/A 10/02/2016   Procedure: Right/Left Heart Cath and Coronary Angiography;  Surgeon: Lennette Bihari, MD;  Location: [facility] INVASIVE CV LAB;  Service: Cardiovascular;  Laterality: N/A;   TEE WITHOUT CARDIOVERSION N/A 11/17/2014   Procedure: TRANSESOPHAGEAL ECHOCARDIOGRAM (TEE);  Surgeon: Laqueta Linden, MD;  Location: [facility];  Service: Cardiovascular;  Laterality: N/A;   TEE WITHOUT CARDIOVERSION N/A 10/09/2016   Procedure: TRANSESOPHAGEAL ECHOCARDIOGRAM (TEE);  Surgeon: Elease Hashimoto Deloris Ping, MD;  Location: [facility] ENDOSCOPY;  Service: Cardiovascular;  Laterality: N/A;   TOTAL KNEE ARTHROPLASTY Left 01/16/2013   Procedure: TOTAL KNEE ARTHROPLASTY;  Surgeon: Vickki Hearing, MD;  Location: [facility]S;  Service: Orthopedics;  Laterality: Left;    Social History: Social History   Socioeconomic History   Marital status: Married    Spouse name: Not on file   Number of children: Not on file   Years of education: Not on file   Highest education level: Not on file  Occupational History   Not on file  Tobacco Use   Smoking status: Never   Smokeless tobacco: Former    Types: Snuff   Tobacco comments:    quit 1980's  Vaping Use   Vaping Use: Never used  Substance and Sexual Activity   Alcohol use: Yes    Alcohol/week: 14.0 standard drinks of alcohol    Types: 14 Cans of beer per week    Comment: 1-2 beers daily    Drug use: Not Currently    Types: Marijuana    Comment: last time 2 years ago   Sexual activity: Yes    Partners: [name]    Birth control/protection:  None  Other Topics Concern   Not on file  Social History Narrative   Lives in [place] with his sister   Carmela Hurt off from [employer].   Social Determinants of Health   Financial Resource Strain: Not on file  Food Insecurity: Food Insecurity Present (06/22/2022)   Hunger Vital Sign    Worried About Running Out of Food in the Last Year: Sometimes true    Ran Out of Food in the Last Year: Sometimes true  Transportation Needs: Unmet Transportation Needs (06/22/2022)   PRAPARE - Administrator, Civil Service (Medical): Yes    Lack of Transportation (Non-Medical): Yes  Physical Activity: Not on file  Stress: Not on file  Social Connections: Not on file  Intimate Partner Violence: Not At Risk (06/22/2022)   Humiliation, Afraid, Rape, and Kick questionnaire    Fear of Current or Ex-Partner: No    Emotionally Abused: No    Physically Abused: No    Sexually Abused: No    Family History: Family History  Problem Relation Age of Onset   Hypertension Mother    Hypertension Sister    Hypertension Brother     Current Medications:  Current Outpatient Medications:     allopurinol (ZYLOPRIM) 300 MG tablet, Take 1 tablet by mouth once daily, Disp: 90 tablet, Rfl: 5   apixaban (ELIQUIS) 5 MG TABS tablet, Take 1 tablet (5 mg total) by mouth 2 (two) times daily., Disp: 180 tablet, Rfl: 3   cholecalciferol (VITAMIN D3) 10 MCG (400 UNIT) TABS tablet, Take 800 Units by mouth daily., Disp: , Rfl:    famotidine (PEPCID) 20 MG tablet, Take 1 tablet (20 mg total) by mouth 2 (two) times daily., Disp: 18 tablet, Rfl: 0   furosemide (LASIX) 40 MG tablet, Take 1 tablet by mouth daily., Disp: , Rfl:    metoprolol succinate (TOPROL-XL) 50 MG 24 hr tablet, TAKE 1 TABLET BY MOUTH ONCE DAILY WITH  OR  IMMEDIATELY  FOLLOWING  A  MEAL, Disp: 90 tablet, Rfl: 3   Multiple Vitamin (MULTIVITAMIN) capsule, Take 1 capsule by mouth daily., Disp: , Rfl:    potassium chloride SA (KLOR-CON) 20 MEQ tablet, Take 1 tablet (20 mEq total) by mouth 2 (two) times daily., Disp: 180 tablet, Rfl: 3   sacubitril-valsartan (ENTRESTO) 24-26 MG, Take 1 tablet by mouth 2 (two) times daily., Disp: 180 tablet, Rfl: 3   Allergies: Allergies  Allergen Reactions   Azithromycin Itching    REVIEW OF SYSTEMS:   Review of Systems  Constitutional:  Negative for chills, fatigue and fever.  HENT:   Negative for lump/mass, mouth sores, nosebleeds, sore throat and trouble swallowing.   Eyes:  Negative for eye problems.  Respiratory:  Positive for shortness of breath. Negative for cough.   Cardiovascular:  Negative for chest pain, leg swelling and palpitations.  Gastrointestinal:  Negative for abdominal pain, constipation, diarrhea, nausea and vomiting.  Genitourinary:  Negative for bladder incontinence, difficulty urinating, dysuria, frequency, hematuria and nocturia.   Musculoskeletal:  Positive for back pain. Negative for arthralgias, flank pain, myalgias and neck pain.  Skin:  Negative for itching and rash.  Neurological:  Negative for dizziness, headaches and numbness.  Hematological:  Does not bruise/bleed  easily.  Psychiatric/Behavioral:  Negative for depression, sleep disturbance and suicidal ideas. The patient is not nervous/anxious.   All other systems reviewed and are negative.    VITALS:   Blood pressure 100/73, pulse 68, temperature 98.5 F (36.9 C), temperature source Oral,  resp. rate 17, height 5\' 11"  (1.803 m), weight 216 lb 3.2 oz (98.1 kg), SpO2 100 %.  Wt Readings from Last 3 Encounters:  07/31/22 216 lb 3.2 oz (98.1 kg)  07/25/22 215 lb 12.8 oz (97.9 kg)  07/05/22 215 lb 12.8 oz (97.9 kg)    Body mass index is 30.15 kg/m.  Performance status (ECOG): 1 - Symptomatic but completely ambulatory  PHYSICAL EXAM:   Physical Exam Vitals and nursing note reviewed. Exam conducted with a chaperone present.  Constitutional:      Appearance: Normal appearance.  Cardiovascular:     Rate and Rhythm: Normal rate and regular rhythm.     Pulses: Normal pulses.     Heart sounds: Normal heart sounds.  Pulmonary:     Effort: Pulmonary effort is normal.     Breath sounds: Normal breath sounds.  Abdominal:     Palpations: Abdomen is soft. There is no hepatomegaly, splenomegaly or mass.     Tenderness: There is no abdominal tenderness.  Musculoskeletal:     Right lower leg: No edema.     Left lower leg: No edema.  Lymphadenopathy:     Cervical: No cervical adenopathy.     Right cervical: No superficial, deep or posterior cervical adenopathy.    Left cervical: No superficial, deep or posterior cervical adenopathy.     Upper Body:     Right upper body: No supraclavicular or axillary adenopathy.     Left upper body: No supraclavicular or axillary adenopathy.  Neurological:     General: No focal deficit present.     Mental Status: He is alert and oriented to person, place, and time.  Psychiatric:        Mood and Affect: Mood normal.        Behavior: Behavior normal.     LABS:      Latest Ref Rng & Units 07/25/2022    8:06 AM 06/22/2022   10:23 AM 06/11/2022    9:27 AM  CBC   WBC 4.0 - 10.5 K/uL 6.1  6.5  7.5   Hemoglobin 13.0 - 17.0 g/dL 16.1  09.6  04.5   Hematocrit 39.0 - 52.0 % 40.6  38.7  40.3   Platelets 150 - 400 K/uL 336  348  341       Latest Ref Rng & Units 06/22/2022   10:23 AM 06/11/2022    9:27 AM 05/25/2020    1:50 PM  CMP  Glucose 70 - 99 mg/dL 93  409  93   BUN 6 - 20 mg/dL 24  25  14    Creatinine 0.61 - 1.24 mg/dL 8.11  9.14  7.82   Sodium 135 - 145 mmol/L 135  136  131   Potassium 3.5 - 5.1 mmol/L 4.2  4.1  3.8   Chloride 98 - 111 mmol/L 101  100  101   CO2 22 - 32 mmol/L 25  26  22    Calcium 8.9 - 10.3 mg/dL 8.9  9.0  9.1   Total Protein 6.5 - 8.1 g/dL 8.5     Total Bilirubin 0.3 - 1.2 mg/dL 0.7     Alkaline Phos 38 - 126 U/L 91     AST 15 - 41 U/L 16     ALT 0 - 44 U/L 11        No results found for: "CEA1", "CEA" / No results found for: "CEA1", "CEA" No results found for: "PSA1" No results found for: "NFA213" No results found  for: "ZOX096"  Lab Results  Component Value Date   TOTALPROTELP 7.7 06/22/2022   TOTALPROTELP 7.7 06/22/2022   ALBUMINELP 3.6 06/22/2022   A1GS 0.2 06/22/2022   A2GS 0.7 06/22/2022   BETS 2.2 (H) 06/22/2022   GAMS 0.9 06/22/2022   MSPIKE 1.4 (H) 06/22/2022   SPEI Comment 06/22/2022   Lab Results  Component Value Date   TIBC 352 06/22/2022   FERRITIN 75 06/22/2022   IRONPCTSAT 24 06/22/2022   Lab Results  Component Value Date   LDH 120 06/22/2022     STUDIES:   IR BONE MARROW BIOPSY & ASPIRATION  Addendum Date: 07/25/2022   ADDENDUM REPORT: 07/25/2022 15:42 ADDENDUM: Radiation Exposure Index (as provided by the fluoroscopic device): 83 mGy Kerma Electronically Signed   By: Richarda Overlie M.D.   On: 07/25/2022 15:42   Result Date: 07/25/2022 INDICATION: 62 year old with IgG lambda MGUS with likely progression to multiple myeloma. EXAM: FLUOROSCOPIC GUIDED BONE MARROW ASPIRATES AND BIOPSY Physician: Rachelle Hora. Lowella Dandy, MD MEDICATIONS: Benadryl 50 mg ANESTHESIA/SEDATION: Moderate (conscious) sedation  was employed during this procedure. A total of Versed 3mg  and fentanyl 100 mcg was administered intravenously at the order of the provider performing the procedure. Total intra-service moderate sedation time: 22 minutes. Patient's level of consciousness and vital signs were monitored continuously by radiology nurse throughout the procedure under the supervision of the provider performing the procedure. COMPLICATIONS: None immediate. PROCEDURE: The procedure was explained to the patient. The risks and benefits of the procedure were discussed and the patient's questions were addressed. Informed consent was obtained from the patient. The patient was placed prone on interventional table. The back was prepped and draped in sterile fashion. Maximal barrier sterile technique was utilized including caps, mask, sterile gowns, sterile gloves, sterile drape, hand hygiene and skin antiseptic. The skin and right posterior ilium were anesthetized with 1% lidocaine. 11 gauge bone needle was directed into the right ilium with CT guidance. Three aspirates and 1 core biopsy were obtained. Bandage placed over the puncture site. IMPRESSION: Fluoroscopic guided bone marrow aspiration and core biopsy. Electronically Signed: By: Richarda Overlie M.D. On: 07/25/2022 10:54   NM PET Image Initial (PI) Whole Body  Result Date: 07/13/2022 CLINICAL DATA:  Initial treatment strategy for multiple myeloma. EXAM: NUCLEAR MEDICINE PET WHOLE BODY TECHNIQUE: 11.6 mCi F-18 FDG was injected intravenously. Full-ring PET imaging was performed from the head to foot after the radiotracer. CT data was obtained and used for attenuation correction and anatomic localization. Fasting blood glucose: 6 110 mg/dl COMPARISON:  None Available. FINDINGS: Mediastinal blood pool activity: SUV max 2.9 HEAD/NECK: No hypermetabolic activity in the scalp. No hypermetabolic cervical lymph nodes. Incidental CT findings: none CHEST: No hypermetabolic mediastinal or hilar nodes.  No suspicious pulmonary nodules on the CT scan. Incidental CT findings: Cardiomegaly. Mild atherosclerotic calcifications of the aortic arch. ABDOMEN/PELVIS: No abnormal hypermetabolic activity within the liver, pancreas, adrenal glands, or spleen. No hypermetabolic lymph nodes in the abdomen or pelvis. Incidental CT findings: Mild atherosclerotic calcifications of the abdominal aorta. SKELETON: No focal hypermetabolic activity to suggest active myeloma. No suspicious lytic lesions on CT. Incidental CT findings: Mild degenerative changes of the lumbar spine. EXTREMITIES: No abnormal hypermetabolic activity in the lower extremities. Incidental CT findings: Bilateral knee arthroplasties. IMPRESSION: No findings suspicious for active myeloma. Electronically Signed   By: Charline Bills M.D.   On: 07/13/2022 04:03

## 2022-07-31 ENCOUNTER — Inpatient Hospital Stay: Payer: Medicaid Other | Attending: Hematology | Admitting: Hematology

## 2022-07-31 VITALS — BP 100/73 | HR 68 | Temp 98.5°F | Resp 17 | Ht 71.0 in | Wt 216.2 lb

## 2022-07-31 DIAGNOSIS — D649 Anemia, unspecified: Secondary | ICD-10-CM | POA: Insufficient documentation

## 2022-07-31 DIAGNOSIS — Z87891 Personal history of nicotine dependence: Secondary | ICD-10-CM | POA: Diagnosis not present

## 2022-07-31 DIAGNOSIS — D472 Monoclonal gammopathy: Secondary | ICD-10-CM | POA: Insufficient documentation

## 2022-07-31 NOTE — Patient Instructions (Signed)
[facility] at [facility] Discharge Instructions   You were seen and examined today by Dr. Ellin Saba.  He reviewed the results of your lab work which are normal/stable.   He reviewed the results of your PET scan which was normal.   We will see you back in 4 months. We will repeat lab work about a week prior to your next visit.   Thank you for choosing [facility] at [facility] to provide your oncology and hematology care.  To afford each patient quality time with our provider, please arrive at least 15 minutes before your scheduled appointment time.   If you have a lab appointment with the Cancer Center please come in thru the Main Entrance and check in at the main information desk.  You need to re-schedule your appointment should you arrive 10 or more minutes late.  We strive to give you quality time with our providers, and arriving late affects you and other patients whose appointments are after yours.  Also, if you no show three or more times for appointments you may be dismissed from the clinic at the providers discretion.     Again, thank you for choosing [facility].  Our hope is that these requests will decrease the amount of time that you wait before being seen by our physicians.       _____________________________________________________________  Should you have questions after your visit to [facility], please contact our office at [phone] and follow the prompts.  Our office hours are 8:00 a.m. and 4:30 p.m. Monday - Friday.  Please note that voicemails left after 4:00 p.m. may not be returned until the following business day.  We are closed weekends and major holidays.  You do have access to a nurse 24-7, just call the main number to the clinic [phone] and do not press any options, hold on the line and a nurse will answer the phone.    For prescription refill requests, have your pharmacy contact our  office and allow 72 hours.    Due to Covid, you will need to wear a mask upon entering the hospital. If you do not have a mask, a mask will be given to you at the Main Entrance upon arrival. For doctor visits, patients may have 1 support person age 62 or older with them. For treatment visits, patients can not have anyone with them due to social distancing guidelines and our immunocompromised population.

## 2022-08-01 ENCOUNTER — Encounter (HOSPITAL_COMMUNITY): Payer: Self-pay

## 2022-08-01 NOTE — Progress Notes (Signed)
[facility] papers completed for the patient and family member.  Message left on voicemail the papers are ready for pick up.  No fax number available.

## 2022-08-02 NOTE — Progress Notes (Signed)
Cardiology Office Note    Date:  08/03/2022  ID:  David [name], DOB 1960/07/11, MRN [id number] Cardiologist: Nona Dell, MD    History of Present Illness:    David [name] is a 62 y.o. male with past medical history of HFrEF/NICM (EF 15-20% by echo in 2018 with cath showing normal cors, EF 25-30% by echo in 11/2018 and 35% in 11/2019), moderate AI, LAA thrombus (history of this but resolved by repeat imaging), persistent atrial fibrillation, HTN, MGUS and Stage 3 CKD who presents to the office today for 82-month follow-up.  He was examined by myself in 01/2022 for cardiac clearance in regards to upcoming right total knee arthroplasty. He denied any recent anginal symptoms and was able to perform more than 4 METS of activity. It was recommended for him to have a follow-up echocardiogram. Repeat echocardiogram did show that his EF had improved slightly to 35 to 40%. He did have moderate aortic valve regurgitation and dilation of the aortic root at 48 mm. Given his cardiomyopathy, Hydralazine was stopped and switched to Entresto 24-26 mg twice daily with plans to add Spironolactone or an SGLT2 inhibitor in the future.  In talking with the patient today, he reports overall doing well since his last office visit. He previously underwent knee replacement and has been progressing well thus far. He does use a cane for ambulation as needed but goes without this as well. He denies any recent chest pain or dyspnea on exertion. No recent orthopnea, PND or pitting edema. He was undergoing workup for multiple myeloma and was informed he had MGUS and just needs to be followed at this time.  Studies Reviewed:   EKG: EKG is not ordered today.  Echocardiogram: 01/2022 IMPRESSIONS     1. Left ventricular ejection fraction, by estimation, is 35 to 40%. The  left ventricle has moderately decreased function. The left ventricle  demonstrates global hypokinesis. The left ventricular internal  cavity size  was mildly dilated. There is moderate   concentric left ventricular hypertrophy. Left ventricular diastolic  parameters are indeterminate.   2. Right ventricular systolic function is low normal. The right  ventricular size is normal. Tricuspid regurgitation signal is inadequate  for assessing PA pressure.   3. Left atrial size was moderately dilated.   4. Right atrial size was mildly dilated.   5. The mitral valve is abnormal. Mild mitral valve regurgitation.   6. The aortic valve is bicuspid. There is mild calcification of the  aortic valve. Aortic valve regurgitation is moderate. Aortic valve  sclerosis/calcification is present, without any evidence of aortic  stenosis. Aortic regurgitation PHT measures 705  msec.   7. Aortic dilatation noted. There is moderate dilatation of the aortic  root, measuring 48 mm.   8. The inferior vena cava is normal in size with greater than 50%  respiratory variability, suggesting right atrial pressure of 3 mmHg.   Comparison(s): Prior images reviewed side by side. LVEF 35-40% range and  RV contraction low normal. Bicuspid aortic valve without stenosis. There  is stable, moderate aortic regurgitation. Aortic root is moderately  dilated at 48 mm.    Physical Exam:   VS:  BP (!) 142/86   Pulse 98   Ht 5\' 11"  (1.803 m)   Wt 223 lb 3.2 oz (101.2 kg)   SpO2 98%   BMI 31.13 kg/m    Wt Readings from Last 3 Encounters:  08/03/22 223 lb 3.2 oz (101.2 kg)  07/31/22 216  lb 3.2 oz (98.1 kg)  07/25/22 215 lb 12.8 oz (97.9 kg)     GEN: Pleasant male appearing in no acute distress NECK: No JVD; No carotid bruits CARDIAC: Irregularly irregular, 2/6 diastolic murmur along RUSB.  RESPIRATORY:  Clear to auscultation without rales, wheezing or rhonchi  ABDOMEN: Appears non-distended. No obvious abdominal masses. EXTREMITIES: No clubbing or cyanosis. No pitting edema.  Distal pedal pulses are 2+ bilaterally.   Assessment and Plan:   1.  HFrEF/NICM - His ejection fraction was previously as low as 15 to 20% in 2018 and catheterization at that time showed normal coronary arteries. EF had improved to 35 to 40% by most recent imaging in 01/2022.  - He appears euvolemic on examination today and his weight has been stable. Will continue current medical therapy with Entresto 24-26 mg twice daily, Toprol-XL 50 mg daily and Lasix 40 mg daily. His BP is elevated today but is usually soft. He was provided with a BP log and encouraged him to return this in 3 to 4 weeks. If BP remains elevated, would further titrate Entresto to 49-51 mg twice daily. Given his cardiomyopathy and CKD, I will check with his Nephrologist to see if he has a preference on starting [place] or Jardiance.  2. Aortic Regurgitation/Dilation of Aortic Root - This was moderate by echocardiogram in 01/2022 and his aortic root was dilated at 48 mm. Will plan for repeat imaging later this year for reassessment.  3. History of LAA Thrombus - This had resolved by repeat imaging. He does remain on Eliquis for anticoagulation given his known atrial fibrillation.  4. Persistent Atrial Fibrillation - He is on Eliquis 5 mg twice daily for anticoagulation and recent labs in 06/2022 showed his hemoglobin was stable at 12.8 with platelets at 348 K. Continue with current regimen. - No recent palpitations and his heart rate is well-controlled in the 80's to 90's today. Continue Toprol-XL 50 mg daily.  5. Stage 3 CKD - Followed by Dr. Wolfgang Phoenix. Baseline creatinine 1.4 - 1.6. At 1.68 when checked in 06/2022.  Signed, Ellsworth Lennox, PA-C

## 2022-08-03 ENCOUNTER — Telehealth: Payer: Self-pay | Admitting: Student

## 2022-08-03 ENCOUNTER — Ambulatory Visit: Payer: Medicaid Other | Attending: Student | Admitting: Student

## 2022-08-03 ENCOUNTER — Encounter: Payer: Self-pay | Admitting: Student

## 2022-08-03 VITALS — BP 142/86 | HR 98 | Ht 71.0 in | Wt 223.2 lb

## 2022-08-03 DIAGNOSIS — I351 Nonrheumatic aortic (valve) insufficiency: Secondary | ICD-10-CM

## 2022-08-03 DIAGNOSIS — I513 Intracardiac thrombosis, not elsewhere classified: Secondary | ICD-10-CM | POA: Diagnosis not present

## 2022-08-03 DIAGNOSIS — I4819 Other persistent atrial fibrillation: Secondary | ICD-10-CM | POA: Diagnosis not present

## 2022-08-03 DIAGNOSIS — I5020 Unspecified systolic (congestive) heart failure: Secondary | ICD-10-CM

## 2022-08-03 DIAGNOSIS — N1832 Chronic kidney disease, stage 3b: Secondary | ICD-10-CM

## 2022-08-03 NOTE — Patient Instructions (Signed)
Medication Instructions:   We will check with Dr. Wolfgang Phoenix in regards to starting Jardiance or Farxiga for your heart muscle and kidney function.   Keep a BP log and return in 3-4 weeks.   *If you need a refill on your cardiac medications before your next appointment, please call your pharmacy*   Follow-Up: At [facility], you and your health needs are our priority.  As part of our continuing mission to provide you with exceptional heart care, we have created designated Provider Care Teams.  These Care Teams include your primary Cardiologist (physician) and Advanced Practice Providers (APPs -  Physician Assistants and Nurse Practitioners) who all work together to provide you with the care you need, when you need it.  We recommend signing up for the patient portal called "MyChart".  Sign up information is provided on this After Visit Summary.  MyChart is used to connect with patients for Virtual Visits (Telemedicine).  Patients are able to view lab/test results, encounter notes, upcoming appointments, etc.  Non-urgent messages can be sent to your provider as well.   To learn more about what you can do with MyChart, go to [email or web address].    Your next appointment:   6 month(s)  Provider:   You may see Nona Dell, MD or one of the following Advanced Practice Providers on your designated Care Team:   [place], PA-C  Jacolyn Reedy, [place]

## 2022-08-03 NOTE — Telephone Encounter (Signed)
   Please let the patient know that I reviewed with Dr. Wolfgang Phoenix and he was in agreement with starting an SGLT2 inhibitor to help with his cardiomyopathy and kidneys. Would start Jardiance 10mg  daily. If not covered by insurance, can use Marcelline Deist but it appears both are on the preferred list for his insurance. Repeat BMET in 1 month with [place] or Nephrology.   Signed, Ellsworth Lennox, PA-C 08/03/2022, 7:51 PM

## 2022-08-06 ENCOUNTER — Encounter (HOSPITAL_COMMUNITY): Payer: Self-pay

## 2022-08-06 MED ORDER — EMPAGLIFLOZIN 10 MG PO TABS: 10.0000 mg | ORAL_TABLET | Freq: Every day | ORAL | 11 refills | Status: DC

## 2022-08-06 NOTE — Progress Notes (Signed)
Patient called with fax number to [facility].  Papers faxed with confirmation received.    Fax no. [phone]

## 2022-08-06 NOTE — Addendum Note (Signed)
Addended by: Roseanne Reno on: 08/06/2022 08:35 AM   Modules accepted: Orders

## 2022-08-06 NOTE — Telephone Encounter (Signed)
Patient notified and verbalized understanding. Patient had no further questions or concerns at this time. Jardiance 10 mg tablet prescription sent to [facility] pharmacy per patients request.

## 2022-09-05 ENCOUNTER — Other Ambulatory Visit: Payer: Self-pay

## 2022-09-05 ENCOUNTER — Telehealth: Payer: Self-pay

## 2022-09-05 MED ORDER — EMPAGLIFLOZIN 10 MG PO TABS: 10.0000 mg | ORAL_TABLET | Freq: Every day | ORAL | 11 refills | Status: DC

## 2022-09-05 NOTE — Telephone Encounter (Signed)
Received fax from [place] saying "patient does not have authorization for community care. Please send prescription to an outside non-VA pharmacy"    I spoke with patient,[facility] and will speak with them as all his medications are covered thru them.   He directed me to use [facility]

## 2022-09-05 NOTE — Telephone Encounter (Signed)
Medication refill for Jardiance 10 mg tablets sent to the [place]

## 2022-09-05 NOTE — Telephone Encounter (Signed)
-----   Message from Ellsworth Lennox, [place] sent at 09/04/2022  7:24 PM EDT ----- Regarding: RE: Labs Thank you for the update and following up on this. Can we resend his Rx?  Thanks,  [place]   ----- Message ----- From: Roseanne Reno, CMA Sent: 09/04/2022  11:04 AM EDT To: Ellsworth Lennox, PA-C Subject: FW: Labs                                       Called pt to remind him to have lab work completed and pt stated that he never received his Jardiance from the [place].  ----- Message ----- From: Roseanne Reno, CMA Sent: 08/06/2022   8:35 AM EDT To: Roseanne Reno, CMA Subject: Labs                                           BMET- 1 month- BS- call patient and remind him.

## 2022-09-10 ENCOUNTER — Telehealth: Payer: Self-pay

## 2022-09-10 ENCOUNTER — Other Ambulatory Visit (HOSPITAL_COMMUNITY): Payer: Self-pay

## 2022-09-10 NOTE — Telephone Encounter (Signed)
*  [facility]  PA request received via CMM for Jardiance 10MG  tablets  PA submitted to [facility] [place] via [facility] and has been APPROVED from 09/10/2022-09/09/2023  Key: [id number]

## 2022-09-28 MED ORDER — EMPAGLIFLOZIN 10 MG PO TABS: 10.0000 mg | ORAL_TABLET | Freq: Every day | ORAL | 11 refills | Status: AC

## 2022-09-28 NOTE — Addendum Note (Signed)
Addended by: Roseanne Reno on: 09/28/2022 02:27 PM   Modules accepted: Orders

## 2022-09-28 NOTE — Telephone Encounter (Signed)
Medication sent to [facility]

## 2022-09-28 NOTE — Telephone Encounter (Signed)
Patient stated he received notice from his insurance company that his empagliflozin (JARDIANCE) 10 MG TABS tablet medication has been approved.  Patient stated he will need a prescription sent to [facility] - [place], [place] - [address].

## 2022-10-17 ENCOUNTER — Telehealth: Payer: Self-pay | Admitting: Cardiology

## 2022-10-17 MED ORDER — FUROSEMIDE 40 MG PO TABS: 40.0000 mg | ORAL_TABLET | Freq: Every day | ORAL | 1 refills | Status: DC

## 2022-10-17 MED ORDER — ENTRESTO 24-26 MG PO TABS: 1.0000 | ORAL_TABLET | Freq: Two times a day (BID) | ORAL | 1 refills | Status: AC

## 2022-10-17 NOTE — Telephone Encounter (Signed)
Pt c/o medication issue:  1. Name of Medication: Jardiance 25 MG  2. How are you currently taking this medication (dosage and times per day)?   3. Are you having a reaction (difficulty breathing--STAT)? No  4. What is your medication issue? Patient called stating that he received this medication on Monday and started to take this medication yesterday. Patient would like to know when he needs to get blood work for taking the medication.

## 2022-10-17 NOTE — Telephone Encounter (Signed)
*  STAT* If patient is at the pharmacy, call can be transferred to refill team.   1. Which medications need to be refilled? (please list name of each medication and dose if known)   sacubitril-valsartan (ENTRESTO) 24-26 MG  furosemide (LASIX) 40 MG tablet   2. Which pharmacy/location (including street and city if local pharmacy) is medication to be sent to?[facility] - [place], [place] - [address]   3. Do they need a 30 day or 90 day supply? 90 day supplu  Pt is currently out of medication

## 2022-10-17 NOTE — Telephone Encounter (Signed)
Spoke with pt who is taking 1/2 of a 25 mg tablet of Jardiance. Pt understands that he needs to have labwork done in 1 month at [facility] lab.

## 2022-10-17 NOTE — Telephone Encounter (Signed)
Refill complete to [place]

## 2022-10-18 NOTE — Telephone Encounter (Signed)
Spoke with [address] with the [facility] who states that the [place] provider prescribed Jardiance 25 mg take 1/2 tablet in the morning. Notified that our provider request pt take Jardiance 10 mg daily. Office note and telephone note faxed to Dr. Burton Apley at [phone].

## 2022-11-01 MED ORDER — FUROSEMIDE 40 MG PO TABS: 40.0000 mg | ORAL_TABLET | Freq: Every day | ORAL | 1 refills | Status: DC

## 2022-11-01 NOTE — Telephone Encounter (Signed)
Rx escribed to pharmacy at pt's request.

## 2022-11-01 NOTE — Addendum Note (Signed)
Addended by: Kerney Elbe on: 11/01/2022 12:18 PM   Modules accepted: Orders

## 2022-11-01 NOTE — Telephone Encounter (Signed)
*  STAT* If patient is at the pharmacy, call can be transferred to refill team.   1. Which medications need to be refilled? (please list name of each medication and dose if known)   furosemide (LASIX) 40 MG tablet   2. Would you like to learn more about the convenience, safety, & potential cost savings by using the [facility]?    3. Are you open to using the [facility] (Type [facility]. ).   4. Which pharmacy/location (including street and city if local pharmacy) is medication to be sent to?  [facility] - [place], [place] - [address]    5. Do they need a 30 day or 90 day supply?   90 day  Patient stated he has not received this medication as yet and wants the refill prescription re-sent to the [facility].

## 2022-11-05 ENCOUNTER — Other Ambulatory Visit: Payer: Self-pay

## 2022-11-05 MED ORDER — FUROSEMIDE 40 MG PO TABS: 40.0000 mg | ORAL_TABLET | Freq: Every day | ORAL | 1 refills | Status: AC

## 2022-11-05 NOTE — Telephone Encounter (Signed)
[facility] sent email requesting [facility]'s lasix refill be sent to "outside non-VA pharmacy" as pt does not have authorization for community care.    E-scribed to [facility]

## 2022-11-19 ENCOUNTER — Other Ambulatory Visit (HOSPITAL_COMMUNITY)
Admission: RE | Admit: 2022-11-19 | Discharge: 2022-11-19 | Disposition: A | Discharge status: Home / Self Care | Payer: Medicaid Other | Source: Ambulatory Visit | Attending: Student | Admitting: Student

## 2022-11-19 DIAGNOSIS — I5020 Unspecified systolic (congestive) heart failure: Secondary | ICD-10-CM | POA: Insufficient documentation

## 2022-11-19 LAB — BASIC METABOLIC PANEL
Anion gap: 10 (ref 5–15)
BUN: 21 mg/dL — ABNORMAL HIGH (ref 6–20)
CO2: 26 mmol/L (ref 22–32)
Calcium: 9 mg/dL (ref 8.9–10.3)
Chloride: 99 mmol/L (ref 98–111)
Creatinine, Ser: 1.29 mg/dL — ABNORMAL HIGH (ref 0.61–1.24)
GFR, Estimated: >60 mL/min (ref >60)
Glucose, Bld: 97 mg/dL (ref 70–99)
Potassium: 4.2 mmol/L (ref 3.5–5.1)
Sodium: 135 mmol/L (ref 135–145)

## 2022-11-30 ENCOUNTER — Inpatient Hospital Stay: Payer: Medicaid Other | Attending: Hematology

## 2022-11-30 DIAGNOSIS — D472 Monoclonal gammopathy: Secondary | ICD-10-CM | POA: Insufficient documentation

## 2022-11-30 LAB — COMPREHENSIVE METABOLIC PANEL
ALT: 17 U/L (ref 0–44)
AST: 16 U/L (ref 15–41)
Albumin: 3.6 g/dL (ref 3.5–5.0)
Alkaline Phosphatase: 99 U/L (ref 38–126)
Anion gap: 11 (ref 5–15)
BUN: 20 mg/dL (ref 6–20)
CO2: 26 mmol/L (ref 22–32)
Calcium: 8.8 mg/dL — ABNORMAL LOW (ref 8.9–10.3)
Chloride: 99 mmol/L (ref 98–111)
Creatinine, Ser: 1.86 mg/dL — ABNORMAL HIGH (ref 0.61–1.24)
GFR, Estimated: 41 mL/min — ABNORMAL LOW (ref >60)
Glucose, Bld: 126 mg/dL — ABNORMAL HIGH (ref 70–99)
Potassium: 3.7 mmol/L (ref 3.5–5.1)
Sodium: 136 mmol/L (ref 135–145)
Total Bilirubin: 0.8 mg/dL (ref 0.3–1.2)
Total Protein: 8.6 g/dL — ABNORMAL HIGH (ref 6.5–8.1)

## 2022-11-30 LAB — CBC WITH DIFFERENTIAL/PLATELET
Abs Immature Granulocytes: 0.01 10*3/uL (ref 0.00–0.07)
Basophils Absolute: 0 10*3/uL (ref 0.0–0.1)
Basophils Relative: 1 %
Eosinophils Absolute: 0.2 10*3/uL (ref 0.0–0.5)
Eosinophils Relative: 4 %
HCT: 45.4 % (ref 39.0–52.0)
Hemoglobin: 14.8 g/dL (ref 13.0–17.0)
Immature Granulocytes: 0 %
Lymphocytes Relative: 33 %
Lymphs Abs: 1.9 10*3/uL (ref 0.7–4.0)
MCH: 32 pg (ref 26.0–34.0)
MCHC: 32.6 g/dL (ref 30.0–36.0)
MCV: 98.3 fL (ref 80.0–100.0)
Monocytes Absolute: 0.7 10*3/uL (ref 0.1–1.0)
Monocytes Relative: 12 %
Neutro Abs: 2.9 10*3/uL (ref 1.7–7.7)
Neutrophils Relative %: 50 %
Platelets: 413 10*3/uL — ABNORMAL HIGH (ref 150–400)
RBC: 4.62 MIL/uL (ref 4.22–5.81)
RDW: 13 % (ref 11.5–15.5)
WBC: 5.7 10*3/uL (ref 4.0–10.5)
nRBC: 0 % (ref 0.0–0.2)

## 2022-11-30 LAB — FERRITIN: Ferritin: 27 ng/mL (ref 24–336)

## 2022-11-30 LAB — IRON AND TIBC
Iron: 103 ug/dL (ref 45–182)
Saturation Ratios: 27 % (ref 17.9–39.5)
TIBC: 384 ug/dL (ref 250–450)
UIBC: 281 ug/dL

## 2022-11-30 LAB — LACTATE DEHYDROGENASE: LDH: 105 U/L (ref 98–192)

## 2022-12-05 LAB — KAPPA/LAMBDA LIGHT CHAINS
Kappa free light chain: 17.5 mg/L (ref 3.3–19.4)
Kappa, lambda light chain ratio: 0.09 — ABNORMAL LOW (ref 0.26–1.65)
Lambda free light chains: 186.6 mg/L — ABNORMAL HIGH (ref 5.7–26.3)

## 2022-12-05 NOTE — Progress Notes (Incomplete)
[facility] [address], [place] [address]    Clinic Day:  12/05/2022  [facility]: [facility]  Patient Care Team: [facility] as PCP - General (General Practice) Jonelle Sidle, MD as PCP - Cardiology (Cardiology) Doreatha Massed, MD as Medical Oncologist (Hematology)   ASSESSMENT & PLAN:   Assessment: 1.  IgG lambda monoclonal gammopathy: - Patient seen at the request of Dr. Wolfgang Phoenix - 01/01/2022: SPEP-0.3 g M spike, kappa-19.2, lambda-163.4, ratio 0.12.  IFE-IgG lambda.  24-hour urine IFE-IgG lambda.  24-hour urine protein 84 mg.  Hb-12.7, creatinine-1.44, calcium-9.3, albumin-3.7 - History of right knee TKR on 02/26/2022. - Skeletal survey (06/22/2022): Concerning for multiple myeloma versus metastatic disease with lucencies to the skull, proximal right humerus, left humerus, left clavicle, lower thoracic vertebral body, subcapital region of the left femur, right tibial diaphysis. - Progressive back pain for the past several months and right-sided knee pain (s/p right TKR in November 2023). Mild fatigue.  No B symptoms or neuropathy.   2.  Social/family history: - Lives at home with his wife.  He is currently disabled.  Previously worked at a [employer].  Non-smoker. - Maternal uncle had lung cancer.  No family history of multiple myeloma.   Plan: 1. IgG lambda MGUS:  - Myeloma labs on 06/22/2022 showed M spike increased to 1.4 g.  Lambda light chains 167 and ratio of 0.12. - BMBx (hypercellular marrow with 9% plasma cells. - We will follow-up on cytogenetics and FISH panel. - Reviewed PET scan from 07/12/2022: No lytic lesions. - We discussed normal prognosis of MGUS with 1% of patients progressing to develop multiple myeloma per year. - Recommended close follow-up in 4 months with repeat SPEP, free light chains.  2.  Mild normocytic anemia: - Likely from combination of CKD and functional iron deficiency. - Will check  ferritin and iron panel at next visit.  No orders of the defined types were placed in this encounter.     Alben Deeds Teague,acting as a Neurosurgeon for Doreatha Massed, MD.,have documented all relevant documentation on the behalf of Doreatha Massed, MD,as directed by  Doreatha Massed, MD while in the presence of Doreatha Massed, MD.  ***   Boyd R Teague   8/28/20249:42 PM  CHIEF COMPLAINT:   Diagnosis: IgG lambda MGUS    Cancer Staging  No matching staging information was found for the patient.    Prior Therapy: none  Current Therapy: Observation   HISTORY OF PRESENT ILLNESS:   Oncology History   No history exists.     INTERVAL HISTORY:   David [name] is a 62 y.o. male presenting to clinic today for follow up of IgG lambda MGUS. He was last seen by me on 07/31/22.  Today, he states that he is doing well overall. His appetite level is at ***%. His energy level is at ***%.  PAST MEDICAL HISTORY:   Past Medical History: Past Medical History:  Diagnosis Date   Alcohol abuse    Arthritis    Bilateral knees    Atrial fibrillation (HCC)    Chronic kidney disease, stage 2, mildly decreased GFR    GERD (gastroesophageal reflux disease)    Gout    Nonischemic cardiomyopathy (HCC)    a. LVEF 20-25% in 2013, improved to 50% on medical therapy. b. 09/2016: echo showing reduced EF of 15-20% and cath showing normal cors.  c. EF 35-40% in 01/2022   TIA (transient ischemic attack)  Surgical History: Past Surgical History:  Procedure Laterality Date   Arthroscopic knee surgery Right 1987   CARDIOVERSION  10/08/2011   Procedure: CARDIOVERSION;  Surgeon: Jonelle Sidle, MD;  Location: [facility]S;  Service: Cardiovascular;  Laterality: N/A;  Done in PACU   CARDIOVERSION  01/10/2012   Procedure: CARDIOVERSION;  Surgeon: Marinus Maw, MD;  Location: [facility] ENDOSCOPY;  Service: Cardiovascular;  Laterality: N/A;   CARDIOVERSION N/A 11/17/2014   Procedure: CARDIOVERSION;   Surgeon: Laqueta Linden, MD;  Location: [facility] ORS;  Service: Cardiovascular;  Laterality: N/A;   IR BONE MARROW BIOPSY & ASPIRATION  07/25/2022   RIGHT/LEFT HEART CATH AND CORONARY ANGIOGRAPHY N/A 10/02/2016   Procedure: Right/Left Heart Cath and Coronary Angiography;  Surgeon: Lennette Bihari, MD;  Location: [facility] INVASIVE CV LAB;  Service: Cardiovascular;  Laterality: N/A;   TEE WITHOUT CARDIOVERSION N/A 11/17/2014   Procedure: TRANSESOPHAGEAL ECHOCARDIOGRAM (TEE);  Surgeon: Laqueta Linden, MD;  Location: [facility];  Service: Cardiovascular;  Laterality: N/A;   TEE WITHOUT CARDIOVERSION N/A 10/09/2016   Procedure: TRANSESOPHAGEAL ECHOCARDIOGRAM (TEE);  Surgeon: Elease Hashimoto Deloris Ping, MD;  Location: [facility]COPY;  Service: Cardiovascular;  Laterality: N/A;   TOTAL KNEE ARTHROPLASTY Left 01/16/2013   Procedure: TOTAL KNEE ARTHROPLASTY;  Surgeon: Vickki Hearing, MD;  Location: [facility];  Service: Orthopedics;  Laterality: Left;    Social History: Social History   Socioeconomic History   Marital status: Married    Spouse name: Not on file   Number of children: Not on file   Years of education: Not on file   Highest education level: Not on file  Occupational History   Not on file  Tobacco Use   Smoking status: Never   Smokeless tobacco: Former    Types: Snuff   Tobacco comments:    quit 1980's  Vaping Use   Vaping status: Never Used  Substance and Sexual Activity   Alcohol use: Yes    Alcohol/week: 14.0 standard drinks of alcohol    Types: 14 Cans of beer per week    Comment: 1-2 beers daily    Drug use: Not Currently    Types: Marijuana    Comment: last time 2 years ago   Sexual activity: Yes    Partners: Male    Birth control/protection: None  Other Topics Concern   Not on file  Social History Narrative   Lives in [place] with his sister   Laid off from [employer].   Social Determinants of Health   Financial Resource Strain: Not on file  Food Insecurity: Food  Insecurity Present (06/22/2022)   Hunger Vital Sign    Worried About Running Out of Food in the Last Year: Sometimes true    Ran Out of Food in the Last Year: Sometimes true  Transportation Needs: Unmet Transportation Needs (06/22/2022)   PRAPARE - Administrator, Civil Service (Medical): Yes    Lack of Transportation (Non-Medical): Yes  Physical Activity: Not on file  Stress: Not on file  Social Connections: Not on file  Intimate Partner Violence: Not At Risk (06/22/2022)   Humiliation, Afraid, Rape, and Kick questionnaire    Fear of Current or Ex-Partner: No    Emotionally Abused: No    Physically Abused: No    Sexually Abused: No    Family History: Family History  Problem Relation Age of Onset   Hypertension Mother    Hypertension Sister    Hypertension Brother     Current Medications:  Current Outpatient Medications:    allopurinol (ZYLOPRIM) 300 MG tablet, Take 1 tablet by mouth once daily, Disp: 90 tablet, Rfl: 5   apixaban (ELIQUIS) 5 MG TABS tablet, Take 1 tablet (5 mg total) by mouth 2 (two) times daily., Disp: 180 tablet, Rfl: 3   cholecalciferol (VITAMIN D3) 10 MCG (400 UNIT) TABS tablet, Take 800 Units by mouth daily., Disp: , Rfl:    empagliflozin (JARDIANCE) 10 MG TABS tablet, Take 1 tablet (10 mg total) by mouth daily before breakfast., Disp: 30 tablet, Rfl: 11   famotidine (PEPCID) 20 MG tablet, Take 1 tablet (20 mg total) by mouth 2 (two) times daily., Disp: 18 tablet, Rfl: 0   furosemide (LASIX) 40 MG tablet, Take 1 tablet (40 mg total) by mouth daily., Disp: 90 tablet, Rfl: 1   metoprolol succinate (TOPROL-XL) 50 MG 24 hr tablet, TAKE 1 TABLET BY MOUTH ONCE DAILY WITH  OR  IMMEDIATELY  FOLLOWING  A  MEAL, Disp: 90 tablet, Rfl: 3   Multiple Vitamin (MULTIVITAMIN) capsule, Take 1 capsule by mouth daily., Disp: , Rfl:    potassium chloride SA (KLOR-CON) 20 MEQ tablet, Take 1 tablet (20 mEq total) by mouth 2 (two) times daily., Disp: 180 tablet, Rfl: 3    sacubitril-valsartan (ENTRESTO) 24-26 MG, Take 1 tablet by mouth 2 (two) times daily., Disp: 180 tablet, Rfl: 1   Allergies: Allergies  Allergen Reactions   Azithromycin Itching    REVIEW OF SYSTEMS:   Review of Systems  Constitutional:  Negative for chills, fatigue and fever.  HENT:   Negative for lump/mass, mouth sores, nosebleeds, sore throat and trouble swallowing.   Eyes:  Negative for eye problems.  Respiratory:  Negative for cough and shortness of breath.   Cardiovascular:  Negative for chest pain, leg swelling and palpitations.  Gastrointestinal:  Negative for abdominal pain, constipation, diarrhea, nausea and vomiting.  Genitourinary:  Negative for bladder incontinence, difficulty urinating, dysuria, frequency, hematuria and nocturia.   Musculoskeletal:  Negative for arthralgias, back pain, flank pain, myalgias and neck pain.  Skin:  Negative for itching and rash.  Neurological:  Negative for dizziness, headaches and numbness.  Hematological:  Does not bruise/bleed easily.  Psychiatric/Behavioral:  Negative for depression, sleep disturbance and suicidal ideas. The patient is not nervous/anxious.   All other systems reviewed and are negative.    VITALS:   There were no vitals taken for this visit.  Wt Readings from Last 3 Encounters:  08/03/22 223 lb 3.2 oz (101.2 kg)  07/31/22 216 lb 3.2 oz (98.1 kg)  07/25/22 215 lb 12.8 oz (97.9 kg)    There is no height or weight on file to calculate BMI.  Performance status (ECOG): 1 - Symptomatic but completely ambulatory  PHYSICAL EXAM:   Physical Exam Vitals and nursing note reviewed. Exam conducted with a chaperone present.  Constitutional:      Appearance: Normal appearance.  Cardiovascular:     Rate and Rhythm: Normal rate and regular rhythm.     Pulses: Normal pulses.     Heart sounds: Normal heart sounds.  Pulmonary:     Effort: Pulmonary effort is normal.     Breath sounds: Normal breath sounds.  Abdominal:      Palpations: Abdomen is soft. There is no hepatomegaly, splenomegaly or mass.     Tenderness: There is no abdominal tenderness.  Musculoskeletal:     Right lower leg: No edema.     Left lower leg: No edema.  Lymphadenopathy:  Cervical: No cervical adenopathy.     Right cervical: No superficial, deep or posterior cervical adenopathy.    Left cervical: No superficial, deep or posterior cervical adenopathy.     Upper Body:     Right upper body: No supraclavicular or axillary adenopathy.     Left upper body: No supraclavicular or axillary adenopathy.  Neurological:     General: No focal deficit present.     Mental Status: He is alert and oriented to person, place, and time.  Psychiatric:        Mood and Affect: Mood normal.        Behavior: Behavior normal.     LABS:      Latest Ref Rng & Units 11/30/2022   10:57 AM 07/25/2022    8:06 AM 06/22/2022   10:23 AM  CBC  WBC 4.0 - 10.5 K/uL 5.7  6.1  6.5   Hemoglobin 13.0 - 17.0 g/dL 16.1  09.6  04.5   Hematocrit 39.0 - 52.0 % 45.4  40.6  38.7   Platelets 150 - 400 K/uL 413  336  348       Latest Ref Rng & Units 11/30/2022   10:57 AM 11/19/2022   12:19 PM 06/22/2022   10:23 AM  CMP  Glucose 70 - 99 mg/dL 409  97  93   BUN 6 - 20 mg/dL 20  21  24    Creatinine 0.61 - 1.24 mg/dL 8.11  9.14  7.82   Sodium 135 - 145 mmol/L 136  135  135   Potassium 3.5 - 5.1 mmol/L 3.7  4.2  4.2   Chloride 98 - 111 mmol/L 99  99  101   CO2 22 - 32 mmol/L 26  26  25    Calcium 8.9 - 10.3 mg/dL 8.8  9.0  8.9   Total Protein 6.5 - 8.1 g/dL 8.6   8.5   Total Bilirubin 0.3 - 1.2 mg/dL 0.8   0.7   Alkaline Phos 38 - 126 U/L 99   91   AST 15 - 41 U/L 16   16   ALT 0 - 44 U/L 17   11      No results found for: "CEA1", "CEA" / No results found for: "CEA1", "CEA" No results found for: "PSA1" No results found for: "CAN199" No results found for: "CAN125"  Lab Results  Component Value Date   TOTALPROTELP 8.1 11/30/2022   ALBUMINELP 3.5 11/30/2022    A1GS 0.3 11/30/2022   A2GS 0.9 11/30/2022   BETS 2.7 (H) 11/30/2022   GAMS 0.8 11/30/2022   MSPIKE 1.5 (H) 11/30/2022   SPEI Comment 11/30/2022   Lab Results  Component Value Date   TIBC 384 11/30/2022   TIBC 352 06/22/2022   FERRITIN 27 11/30/2022   FERRITIN 75 06/22/2022   IRONPCTSAT 27 11/30/2022   IRONPCTSAT 24 06/22/2022   Lab Results  Component Value Date   LDH 105 11/30/2022   LDH 120 06/22/2022     STUDIES:   No results found.

## 2022-12-06 ENCOUNTER — Inpatient Hospital Stay: Payer: Medicaid Other | Admitting: Hematology

## 2022-12-06 LAB — PROTEIN ELECTROPHORESIS, SERUM
A/G Ratio: 0.8 (ref 0.7–1.7)
Albumin ELP: 3.5 g/dL (ref 2.9–4.4)
Alpha-1-Globulin: 0.3 g/dL (ref 0.0–0.4)
Alpha-2-Globulin: 0.9 g/dL (ref 0.4–1.0)
Beta Globulin: 2.7 g/dL — ABNORMAL HIGH (ref 0.7–1.3)
Gamma Globulin: 0.8 g/dL (ref 0.4–1.8)
Globulin, Total: 4.6 g/dL — ABNORMAL HIGH (ref 2.2–3.9)
M-Spike, %: 1.5 g/dL — ABNORMAL HIGH
Total Protein ELP: 8.1 g/dL (ref 6.0–8.5)

## 2022-12-18 NOTE — Progress Notes (Signed)
[facility] [address], [place] [address]    Clinic Day:  12/19/2022  Referring physician: [facility]  Patient Care Team: [facility] as PCP - General (General Practice) Jonelle Sidle, MD as PCP - Cardiology (Cardiology) Doreatha Massed, MD as Medical Oncologist (Hematology)   ASSESSMENT & PLAN:   Assessment: 1.  IgG lambda monoclonal gammopathy: - Patient seen at the request of Dr. Wolfgang Phoenix - 01/01/2022: SPEP-0.3 g M spike, kappa-19.2, lambda-163.4, ratio 0.12.  IFE-IgG lambda.  24-hour urine IFE-IgG lambda.  24-hour urine protein 84 mg.  Hb-12.7, creatinine-1.44, calcium-9.3, albumin-3.7 - History of right knee TKR on 02/26/2022. - Skeletal survey (06/22/2022): Concerning for multiple myeloma versus metastatic disease with lucencies to the skull, proximal right humerus, left humerus, left clavicle, lower thoracic vertebral body, subcapital region of the left femur, right tibial diaphysis. - Progressive back pain for the past several months and right-sided knee pain (s/p right TKR in November 2023). Mild fatigue.  No B symptoms or neuropathy. - BMBX (07/25/2022): Hypercellular marrow with 9% plasma cells.  Cytogenetics and FISH panel were normal. - PET scan on 07/12/2022: No evidence of myeloma.   2.  Social/family history: - Lives at home with his wife.  He is currently disabled.  Previously worked at a [employer].  Non-smoker. - Maternal uncle had lung cancer.  No family history of multiple myeloma.    Plan: 1. IgG lambda MGUS:  - Denies any new onset pains. - Labs from 11/30/2022 reviewed.  M spike is stable at 1.5 g.  Lambda light chains are 186 and ratio is 0.09 more or less stable.  Hemoglobin is normal. - Calcium is 8.8.  However creatinine has increased to 1.86. - He reported that he started Jardiance 10 mg daily sometime in July.  This could be contributing to the elevated creatinine.  Recommend follow-up with Dr. Wolfgang Phoenix. -  No indication for treatment at this time. - Recommend RTC 6 months with repeat myeloma labs and skeletal survey.   2.  Mild normocytic anemia: - Likely from combination of CKD and functional iron deficiency. - Ferritin was low at 27 and percent saturation 27. - Recommend starting iron tablet on Monday Wednesday and Friday.    Orders Placed This Encounter  Procedures   DG Bone Survey Met    Standing Status:   Future    Standing Expiration Date:   12/19/2023    Order Specific Question:   Reason for Exam (SYMPTOM  OR DIAGNOSIS REQUIRED)    Answer:   MGUS    Order Specific Question:   Preferred imaging location?    Answer:   [facility]   CBC with Differential    Standing Status:   Future    Standing Expiration Date:   12/19/2023   Comprehensive metabolic panel    Standing Status:   Future    Standing Expiration Date:   12/19/2023   Kappa/lambda light chains    Standing Status:   Future    Standing Expiration Date:   12/19/2023   Protein electrophoresis, serum    Standing Status:   Future    Standing Expiration Date:   12/19/2023   Iron and TIBC ([facility] ONLY)    Standing Status:   Future    Standing Expiration Date:   12/19/2023   Ferritin    Standing Status:   Future    Standing Expiration Date:   12/19/2023      I,Katie Daubenspeck,acting  as a scribe for Doreatha Massed, MD.,have documented all relevant documentation on the behalf of Doreatha Massed, MD,as directed by  Doreatha Massed, MD while in the presence of Doreatha Massed, MD.   I, Doreatha Massed MD, have reviewed the above documentation for accuracy and completeness, and I agree with the above.   Doreatha Massed, MD   9/11/20246:09 PM  CHIEF COMPLAINT:   Diagnosis: IgG lambda MGUS    Cancer Staging  No matching staging information was found for the patient.    Prior Therapy: none  Current Therapy:  observation   HISTORY OF PRESENT ILLNESS:   Oncology  History   No history exists.     INTERVAL HISTORY:   Benedetto is a 62 y.o. male presenting to clinic today for follow up of IgG lambda MGUS. He was last seen by me on 07/31/22.  Today, he states that he is doing well overall. His appetite level is at 100%. His energy level is at 100%.  PAST MEDICAL HISTORY:   Past Medical History: Past Medical History:  Diagnosis Date   Alcohol abuse    Arthritis    Bilateral knees    Atrial fibrillation (HCC)    Chronic kidney disease, stage 2, mildly decreased GFR    GERD (gastroesophageal reflux disease)    Gout    Nonischemic cardiomyopathy (HCC)    a. LVEF 20-25% in 2013, improved to 50% on medical therapy. b. 09/2016: echo showing reduced EF of 15-20% and cath showing normal cors.  c. EF 35-40% in 01/2022   TIA (transient ischemic attack)     Surgical History: Past Surgical History:  Procedure Laterality Date   Arthroscopic knee surgery Right 1987   CARDIOVERSION  10/08/2011   Procedure: CARDIOVERSION;  Surgeon: Jonelle Sidle, MD;  Location: [facility]S;  Service: Cardiovascular;  Laterality: N/A;  Done in PACU   CARDIOVERSION  01/10/2012   Procedure: CARDIOVERSION;  Surgeon: Marinus Maw, MD;  Location: [facility] ENDOSCOPY;  Service: Cardiovascular;  Laterality: N/A;   CARDIOVERSION N/A 11/17/2014   Procedure: CARDIOVERSION;  Surgeon: Laqueta Linden, MD;  Location: [facility] ORS;  Service: Cardiovascular;  Laterality: N/A;   IR BONE MARROW BIOPSY & ASPIRATION  07/25/2022   RIGHT/LEFT HEART CATH AND CORONARY ANGIOGRAPHY N/A 10/02/2016   Procedure: Right/Left Heart Cath and Coronary Angiography;  Surgeon: Lennette Bihari, MD;  Location: [facility] INVASIVE CV LAB;  Service: Cardiovascular;  Laterality: N/A;   TEE WITHOUT CARDIOVERSION N/A 11/17/2014   Procedure: TRANSESOPHAGEAL ECHOCARDIOGRAM (TEE);  Surgeon: Laqueta Linden, MD;  Location: [facility];  Service: Cardiovascular;  Laterality: N/A;   TEE WITHOUT CARDIOVERSION N/A 10/09/2016   Procedure:  TRANSESOPHAGEAL ECHOCARDIOGRAM (TEE);  Surgeon: Elease Hashimoto Deloris Ping, MD;  Location: [facility] ENDOSCOPY;  Service: Cardiovascular;  Laterality: N/A;   TOTAL KNEE ARTHROPLASTY Left 01/16/2013   Procedure: TOTAL KNEE ARTHROPLASTY;  Surgeon: Vickki Hearing, MD;  Location: [facility] ORS;  Service: Orthopedics;  Laterality: Left;    Social History: Social History   Socioeconomic History   Marital status: Married    Spouse name: Not on file   Number of children: Not on file   Years of education: Not on file   Highest education level: Not on file  Occupational History   Not on file  Tobacco Use   Smoking status: Never   Smokeless tobacco: Former    Types: Snuff   Tobacco comments:    quit 1980's  Vaping Use   Vaping status: Never Used  Substance and Sexual Activity  Alcohol use: Yes    Alcohol/week: 14.0 standard drinks of alcohol    Types: 14 Cans of beer per week    Comment: 1-2 beers daily    Drug use: Not Currently    Types: Marijuana    Comment: last time 2 years ago   Sexual activity: Yes    Partners: Male    Birth control/protection: None  Other Topics Concern   Not on file  Social History Narrative   Lives in [place] with his sister   Laid off from [employer].   Social Determinants of Health   Financial Resource Strain: Not on file  Food Insecurity: Food Insecurity Present (06/22/2022)   Hunger Vital Sign    Worried About Running Out of Food in the Last Year: Sometimes true    Ran Out of Food in the Last Year: Sometimes true  Transportation Needs: Unmet Transportation Needs (06/22/2022)   PRAPARE - Administrator, Civil Service (Medical): Yes    Lack of Transportation (Non-Medical): Yes  Physical Activity: Not on file  Stress: Not on file  Social Connections: Not on file  Intimate Partner Violence: Not At Risk (06/22/2022)   Humiliation, Afraid, Rape, and Kick questionnaire    Fear of Current or Ex-Partner: No    Emotionally Abused: No    Physically  Abused: No    Sexually Abused: No    Family History: Family History  Problem Relation Age of Onset   Hypertension Mother    Hypertension Sister    Hypertension Brother     Current Medications:  Current Outpatient Medications:    allopurinol (ZYLOPRIM) 300 MG tablet, Take 1 tablet by mouth once daily, Disp: 90 tablet, Rfl: 5   apixaban (ELIQUIS) 5 MG TABS tablet, Take 1 tablet (5 mg total) by mouth 2 (two) times daily., Disp: 180 tablet, Rfl: 3   cholecalciferol (VITAMIN D3) 10 MCG (400 UNIT) TABS tablet, Take 800 Units by mouth daily., Disp: , Rfl:    empagliflozin (JARDIANCE) 10 MG TABS tablet, Take 1 tablet (10 mg total) by mouth daily before breakfast., Disp: 30 tablet, Rfl: 11   famotidine (PEPCID) 20 MG tablet, Take 1 tablet (20 mg total) by mouth 2 (two) times daily., Disp: 18 tablet, Rfl: 0   furosemide (LASIX) 40 MG tablet, Take 1 tablet (40 mg total) by mouth daily., Disp: 90 tablet, Rfl: 1   metoprolol succinate (TOPROL-XL) 50 MG 24 hr tablet, TAKE 1 TABLET BY MOUTH ONCE DAILY WITH  OR  IMMEDIATELY  FOLLOWING  A  MEAL, Disp: 90 tablet, Rfl: 3   Multiple Vitamin (MULTIVITAMIN) capsule, Take 1 capsule by mouth daily., Disp: , Rfl:    potassium chloride SA (KLOR-CON) 20 MEQ tablet, Take 1 tablet (20 mEq total) by mouth 2 (two) times daily., Disp: 180 tablet, Rfl: 3   sacubitril-valsartan (ENTRESTO) 24-26 MG, Take 1 tablet by mouth 2 (two) times daily., Disp: 180 tablet, Rfl: 1   Allergies: Allergies  Allergen Reactions   Azithromycin Itching    REVIEW OF SYSTEMS:   Review of Systems  Constitutional:  Negative for chills, fatigue and fever.  HENT:   Negative for lump/mass, mouth sores, nosebleeds, sore throat and trouble swallowing.   Eyes:  Negative for eye problems.  Respiratory:  Positive for cough. Negative for shortness of breath.   Cardiovascular:  Negative for chest pain, leg swelling and palpitations.  Gastrointestinal:  Negative for abdominal pain, constipation,  diarrhea, nausea and vomiting.  Genitourinary:  Negative for bladder  incontinence, difficulty urinating, dysuria, frequency, hematuria and nocturia.   Musculoskeletal:  Negative for arthralgias, back pain, flank pain, myalgias and neck pain.  Skin:  Negative for itching and rash.  Neurological:  Positive for headaches. Negative for dizziness and numbness.  Hematological:  Does not bruise/bleed easily.  Psychiatric/Behavioral:  Positive for sleep disturbance. Negative for depression and suicidal ideas. The patient is not nervous/anxious.   All other systems reviewed and are negative.    VITALS:   Blood pressure 135/76, pulse 76, temperature 98.1 F (36.7 C), temperature source Oral, resp. rate 16, weight 225 lb 11.2 oz (102.4 kg), SpO2 98%.  Wt Readings from Last 3 Encounters:  12/19/22 225 lb 11.2 oz (102.4 kg)  08/03/22 223 lb 3.2 oz (101.2 kg)  07/31/22 216 lb 3.2 oz (98.1 kg)    Body mass index is 31.48 kg/m.  Performance status (ECOG): 1 - Symptomatic but completely ambulatory  PHYSICAL EXAM:   Physical Exam Vitals and nursing note reviewed. Exam conducted with a chaperone present.  Constitutional:      Appearance: Normal appearance.  Cardiovascular:     Rate and Rhythm: Normal rate and regular rhythm.     Pulses: Normal pulses.     Heart sounds: Normal heart sounds.  Pulmonary:     Effort: Pulmonary effort is normal.     Breath sounds: Normal breath sounds.  Abdominal:     Palpations: Abdomen is soft. There is no hepatomegaly, splenomegaly or mass.     Tenderness: There is no abdominal tenderness.  Musculoskeletal:     Right lower leg: No edema.     Left lower leg: No edema.  Lymphadenopathy:     Cervical: No cervical adenopathy.     Right cervical: No superficial, deep or posterior cervical adenopathy.    Left cervical: No superficial, deep or posterior cervical adenopathy.     Upper Body:     Right upper body: No supraclavicular or axillary adenopathy.     Left  upper body: No supraclavicular or axillary adenopathy.  Neurological:     General: No focal deficit present.     Mental Status: He is alert and oriented to person, place, and time.  Psychiatric:        Mood and Affect: Mood normal.        Behavior: Behavior normal.     LABS:      Latest Ref Rng & Units 11/30/2022   10:57 AM 07/25/2022    8:06 AM 06/22/2022   10:23 AM  CBC  WBC 4.0 - 10.5 K/uL 5.7  6.1  6.5   Hemoglobin 13.0 - 17.0 g/dL 28.4  13.2  44.0   Hematocrit 39.0 - 52.0 % 45.4  40.6  38.7   Platelets 150 - 400 K/uL 413  336  348       Latest Ref Rng & Units 11/30/2022   10:57 AM 11/19/2022   12:19 PM 06/22/2022   10:23 AM  CMP  Glucose 70 - 99 mg/dL 102  97  93   BUN 6 - 20 mg/dL 20  21  24    Creatinine 0.61 - 1.24 mg/dL 7.25  3.66  4.40   Sodium 135 - 145 mmol/L 136  135  135   Potassium 3.5 - 5.1 mmol/L 3.7  4.2  4.2   Chloride 98 - 111 mmol/L 99  99  101   CO2 22 - 32 mmol/L 26  26  25    Calcium 8.9 - 10.3 mg/dL 8.8  9.0  8.9   Total Protein 6.5 - 8.1 g/dL 8.6   8.5   Total Bilirubin 0.3 - 1.2 mg/dL 0.8   0.7   Alkaline Phos 38 - 126 U/L 99   91   AST 15 - 41 U/L 16   16   ALT 0 - 44 U/L 17   11      No results found for: "CEA1", "CEA" / No results found for: "CEA1", "CEA" No results found for: "PSA1" No results found for: "ZOX096" No results found for: "CAN125"  Lab Results  Component Value Date   TOTALPROTELP 8.1 11/30/2022   ALBUMINELP 3.5 11/30/2022   A1GS 0.3 11/30/2022   A2GS 0.9 11/30/2022   BETS 2.7 (H) 11/30/2022   GAMS 0.8 11/30/2022   MSPIKE 1.5 (H) 11/30/2022   SPEI Comment 11/30/2022   Lab Results  Component Value Date   TIBC 384 11/30/2022   TIBC 352 06/22/2022   FERRITIN 27 11/30/2022   FERRITIN 75 06/22/2022   IRONPCTSAT 27 11/30/2022   IRONPCTSAT 24 06/22/2022   Lab Results  Component Value Date   LDH 105 11/30/2022   LDH 120 06/22/2022     STUDIES:   No results found.

## 2022-12-19 ENCOUNTER — Inpatient Hospital Stay: Payer: Medicaid Other | Attending: Hematology | Admitting: Hematology

## 2022-12-19 VITALS — BP 135/76 | HR 76 | Temp 98.1°F | Resp 16 | Wt 225.7 lb

## 2022-12-19 DIAGNOSIS — D649 Anemia, unspecified: Secondary | ICD-10-CM | POA: Diagnosis not present

## 2022-12-19 DIAGNOSIS — D472 Monoclonal gammopathy: Secondary | ICD-10-CM | POA: Insufficient documentation

## 2022-12-19 NOTE — Patient Instructions (Signed)
[facility] at [facility] Discharge Instructions   You were seen and examined today by Dr. Ellin Saba.  He reviewed the results of your lab work which are mostly normal/stable. Your iron is slightly low.   Dr Kirtland Bouchard recommends you take an over the counter iron tablet, 325 mg on Mondays, Wednesdays, and Fridays.   Return as scheduled.    Thank you for choosing [facility] at [facility] to provide your oncology and hematology care.  To afford each patient quality time with our provider, please arrive at least 15 minutes before your scheduled appointment time.   If you have a lab appointment with the Cancer Center please come in thru the Main Entrance and check in at the main information desk.  You need to re-schedule your appointment should you arrive 10 or more minutes late.  We strive to give you quality time with our providers, and arriving late affects you and other patients whose appointments are after yours.  Also, if you no show three or more times for appointments you may be dismissed from the clinic at the providers discretion.     Again, thank you for choosing [facility].  Our hope is that these requests will decrease the amount of time that you wait before being seen by our physicians.       _____________________________________________________________  Should you have questions after your visit to [facility], please contact our office at [phone] and follow the prompts.  Our office hours are 8:00 a.m. and 4:30 p.m. Monday - Friday.  Please note that voicemails left after 4:00 p.m. may not be returned until the following business day.  We are closed weekends and major holidays.  You do have access to a nurse 24-7, just call the main number to the clinic [phone] and do not press any options, hold on the line and a nurse will answer the phone.    For prescription refill requests, have your pharmacy contact  our office and allow 72 hours.    Due to Covid, you will need to wear a mask upon entering the hospital. If you do not have a mask, a mask will be given to you at the Main Entrance upon arrival. For doctor visits, patients may have 1 support person age 50 or older with them. For treatment visits, patients can not have anyone with them due to social distancing guidelines and our immunocompromised population.

## 2023-06-13 ENCOUNTER — Inpatient Hospital Stay: Payer: Medicaid Other | Attending: Hematology

## 2023-06-13 DIAGNOSIS — D472 Monoclonal gammopathy: Secondary | ICD-10-CM | POA: Insufficient documentation

## 2023-06-13 DIAGNOSIS — Z87891 Personal history of nicotine dependence: Secondary | ICD-10-CM | POA: Insufficient documentation

## 2023-06-13 DIAGNOSIS — Z801 Family history of malignant neoplasm of trachea, bronchus and lung: Secondary | ICD-10-CM | POA: Insufficient documentation

## 2023-06-13 DIAGNOSIS — D649 Anemia, unspecified: Secondary | ICD-10-CM | POA: Diagnosis not present

## 2023-06-13 LAB — COMPREHENSIVE METABOLIC PANEL
ALT: 14 U/L (ref 0–44)
AST: 15 U/L (ref 15–41)
Albumin: 3.4 g/dL — ABNORMAL LOW (ref 3.5–5.0)
Alkaline Phosphatase: 87 U/L (ref 38–126)
Anion gap: 11 (ref 5–15)
BUN: 19 mg/dL (ref 8–23)
CO2: 27 mmol/L (ref 22–32)
Calcium: 9.1 mg/dL (ref 8.9–10.3)
Chloride: 101 mmol/L (ref 98–111)
Creatinine, Ser: 1.44 mg/dL — ABNORMAL HIGH (ref 0.61–1.24)
GFR, Estimated: 55 mL/min — ABNORMAL LOW (ref >60)
Glucose, Bld: 134 mg/dL — ABNORMAL HIGH (ref 70–99)
Potassium: 4 mmol/L (ref 3.5–5.1)
Sodium: 139 mmol/L (ref 135–145)
Total Bilirubin: 0.4 mg/dL (ref 0.0–1.2)
Total Protein: 8.4 g/dL — ABNORMAL HIGH (ref 6.5–8.1)

## 2023-06-13 LAB — FERRITIN: Ferritin: 19 ng/mL — ABNORMAL LOW (ref 24–336)

## 2023-06-13 LAB — CBC WITH DIFFERENTIAL/PLATELET
Abs Immature Granulocytes: 0.02 10*3/uL (ref 0.00–0.07)
Basophils Absolute: 0 10*3/uL (ref 0.0–0.1)
Basophils Relative: 1 %
Eosinophils Absolute: 0.2 10*3/uL (ref 0.0–0.5)
Eosinophils Relative: 2 %
HCT: 46.1 % (ref 39.0–52.0)
Hemoglobin: 14.6 g/dL (ref 13.0–17.0)
Immature Granulocytes: 0 %
Lymphocytes Relative: 36 %
Lymphs Abs: 2.4 10*3/uL (ref 0.7–4.0)
MCH: 30.8 pg (ref 26.0–34.0)
MCHC: 31.7 g/dL (ref 30.0–36.0)
MCV: 97.3 fL (ref 80.0–100.0)
Monocytes Absolute: 0.9 10*3/uL (ref 0.1–1.0)
Monocytes Relative: 13 %
Neutro Abs: 3.2 10*3/uL (ref 1.7–7.7)
Neutrophils Relative %: 48 %
Platelets: 402 10*3/uL — ABNORMAL HIGH (ref 150–400)
RBC: 4.74 MIL/uL (ref 4.22–5.81)
RDW: 13.9 % (ref 11.5–15.5)
WBC: 6.6 10*3/uL (ref 4.0–10.5)
nRBC: 0 % (ref 0.0–0.2)

## 2023-06-13 LAB — IRON AND TIBC
Iron: 45 ug/dL (ref 45–182)
Saturation Ratios: 11 % — ABNORMAL LOW (ref 17.9–39.5)
TIBC: 393 ug/dL (ref 250–450)
UIBC: 348 ug/dL

## 2023-06-14 LAB — KAPPA/LAMBDA LIGHT CHAINS
Kappa free light chain: 20.7 mg/L — ABNORMAL HIGH (ref 3.3–19.4)
Kappa, lambda light chain ratio: 0.1 — ABNORMAL LOW (ref 0.26–1.65)
Lambda free light chains: 209.9 mg/L — ABNORMAL HIGH (ref 5.7–26.3)

## 2023-06-17 LAB — PROTEIN ELECTROPHORESIS, SERUM
A/G Ratio: 0.8 (ref 0.7–1.7)
Albumin ELP: 3.4 g/dL (ref 2.9–4.4)
Alpha-1-Globulin: 0.2 g/dL (ref 0.0–0.4)
Alpha-2-Globulin: 0.7 g/dL (ref 0.4–1.0)
Beta Globulin: 2.5 g/dL — ABNORMAL HIGH (ref 0.7–1.3)
Gamma Globulin: 0.8 g/dL (ref 0.4–1.8)
Globulin, Total: 4.3 g/dL — ABNORMAL HIGH (ref 2.2–3.9)
M-Spike, %: 1.6 g/dL — ABNORMAL HIGH
Total Protein ELP: 7.7 g/dL (ref 6.0–8.5)

## 2023-06-19 NOTE — Progress Notes (Unsigned)
 [facility] [address], [place] [address]    Clinic Day:  06/20/2023  Referring physician: [facility]  Patient Care Team: [facility] as PCP - General (General Practice) Jonelle Sidle, MD as PCP - Cardiology (Cardiology) Doreatha Massed, MD as Medical Oncologist (Hematology)   ASSESSMENT & PLAN:   Assessment: 1.  IgG lambda monoclonal gammopathy: - Patient seen at the request of Dr. Wolfgang Phoenix - 01/01/2022: SPEP-0.3 g M spike, kappa-19.2, lambda-163.4, ratio 0.12.  IFE-IgG lambda.  24-hour urine IFE-IgG lambda.  24-hour urine protein 84 mg.  Hb-12.7, creatinine-1.44, calcium-9.3, albumin-3.7 - History of right knee TKR on 02/26/2022. - Skeletal survey (06/22/2022): Concerning for multiple myeloma versus metastatic disease with lucencies to the skull, proximal right humerus, left humerus, left clavicle, lower thoracic vertebral body, subcapital region of the left femur, right tibial diaphysis. - Progressive back pain for the past several months and right-sided knee pain (s/p right TKR in November 2023). Mild fatigue.  No B symptoms or neuropathy. - BMBX (07/25/2022): Hypercellular marrow with 9% plasma cells.  Cytogenetics and FISH panel were normal. - PET scan on 07/12/2022: No evidence of myeloma.   2.  Social/family history: - Lives at home with his wife.  He is currently disabled.  Previously worked at a [employer].  Non-smoker. - Maternal uncle had lung cancer.  No family history of multiple myeloma.    Plan: 1. IgG lambda MGUS:  - Denies any new onset pains. - Labs from 06/13/2023 show stable M spike 1.6 (1.5).  Lambda light chains are two 9.9 (186.6) with a kappa lambda light chain ratio of 0.10 0.09).  Slight elevation in kappa free light chains at 20.7 (17.5).  Hemoglobin is stable at 14.6.  Calcium 9.1 (8.8).  Creatinine has improved from previous at 1.44 (1.86). -Most recent bone survey 06/22/2022 which showed several small  lucencies in proximal right and left humerus, spine, femur and tibia.  Prominent lucency in left clavicle.  Faint small lucency noted in the skull.  Findings suggest multiple myeloma and/or metastatic disease. -There is no indication for treatment at this time. -Recommend stat bone survey and return to clinic in 6 months with labs a few days before.    2.  Mild normocytic anemia: - Likely from combination of CKD and functional iron deficiency. - Creatinine has improved from previous visit.  He is followed by Dr. Wolfgang Phoenix. -Hemoglobin is stable.  Ferritin is slightly decreased from previous and is down to 19 with an iron saturation of 11%. -He has not been taking iron supplments.  -She denies any bleeding, melena or hematochezia.  No prior colonoscopy. -We discussed starting iron tabs everyother day with orange juice/vit C.  Patient does take a PPI for GERD.  Recommend taking at least 6 hours before or after.  PLAN SUMMARY: >> Start iron supplements. >> Bone survey ASAP. >> Return to clinic in 6 months with labs a few days before.     Orders Placed This Encounter  Procedures   CBC with Differential    Standing Status:   Future    Expected Date:   12/21/2023    Expiration Date:   06/19/2024   Comprehensive metabolic panel    Standing Status:   Future    Expected Date:   12/21/2023    Expiration Date:   06/19/2024   Kappa/lambda light chains    Standing Status:   Future    Expected Date:   12/21/2023  Expiration Date:   06/19/2024   Protein electrophoresis, serum    Standing Status:   Future    Expected Date:   12/21/2023    Expiration Date:   06/19/2024   Iron and TIBC ([facility] ONLY)    Standing Status:   Future    Expected Date:   12/21/2023    Expiration Date:   06/19/2024   Ferritin    Standing Status:   Future    Expected Date:   12/21/2023    Expiration Date:   06/19/2024    Mauro Kaufmann, NP   3/13/20259:36 AM  CHIEF COMPLAINT:   Diagnosis: IgG  lambda MGUS    Cancer Staging  No matching staging information was found for the patient.    Prior Therapy: none  Current Therapy:  observation   HISTORY OF PRESENT ILLNESS:   Oncology History   No history exists.     INTERVAL HISTORY:   David [name] is a 63 y.o. male presenting to clinic today for follow up of IgG lambda MGUS. He was last seen in clinic on 12/19/2022 by Dr. Ellin Saba.  He denies any surgeries, hospitalizations or changes to his baseline health.  Today, he states that he is doing well overall. His appetite level is at 100%. His energy level is at 75%.  He denies any new bone pains.   Reports he has not been taking iron supplements.  He denies any bleeding.  Reports chronic stable shortness of breath and cough.  Reports trouble sleeping at times.  Overall stable.  PAST MEDICAL HISTORY:   Past Medical History: Past Medical History:  Diagnosis Date   Alcohol abuse    Arthritis    Bilateral knees    Atrial fibrillation (HCC)    Chronic kidney disease, stage 2, mildly decreased GFR    GERD (gastroesophageal reflux disease)    Gout    Nonischemic cardiomyopathy (HCC)    a. LVEF 20-25% in 2013, improved to 50% on medical therapy. b. 09/2016: echo showing reduced EF of 15-20% and cath showing normal cors.  c. EF 35-40% in 01/2022   TIA (transient ischemic attack)     Surgical History: Past Surgical History:  Procedure Laterality Date   Arthroscopic knee surgery Right 1987   CARDIOVERSION  10/08/2011   Procedure: CARDIOVERSION;  Surgeon: Jonelle Sidle, MD;  Location: [facility]S;  Service: Cardiovascular;  Laterality: N/A;  Done in PACU   CARDIOVERSION  01/10/2012   Procedure: CARDIOVERSION;  Surgeon: Marinus Maw, MD;  Location: [facility] ENDOSCOPY;  Service: Cardiovascular;  Laterality: N/A;   CARDIOVERSION N/A 11/17/2014   Procedure: CARDIOVERSION;  Surgeon: Laqueta Linden, MD;  Location: [facility] ORS;  Service: Cardiovascular;  Laterality: N/A;   IR BONE MARROW  BIOPSY & ASPIRATION  07/25/2022   RIGHT/LEFT HEART CATH AND CORONARY ANGIOGRAPHY N/A 10/02/2016   Procedure: Right/Left Heart Cath and Coronary Angiography;  Surgeon: Lennette Bihari, MD;  Location: [facility] INVASIVE CV LAB;  Service: Cardiovascular;  Laterality: N/A;   TEE WITHOUT CARDIOVERSION N/A 11/17/2014   Procedure: TRANSESOPHAGEAL ECHOCARDIOGRAM (TEE);  Surgeon: Laqueta Linden, MD;  Location: [facility]S;  Service: Cardiovascular;  Laterality: N/A;   TEE WITHOUT CARDIOVERSION N/A 10/09/2016   Procedure: TRANSESOPHAGEAL ECHOCARDIOGRAM (TEE);  Surgeon: Elease Hashimoto Deloris Ping, MD;  Location: [facility] ENDOSCOPY;  Service: Cardiovascular;  Laterality: N/A;   TOTAL KNEE ARTHROPLASTY Left 01/16/2013   Procedure: TOTAL KNEE ARTHROPLASTY;  Surgeon: Vickki Hearing, MD;  Location: [facility] ORS;  Service: Orthopedics;  Laterality: Left;  Social History: Social History   Socioeconomic History   Marital status: Married    Spouse name: Not on file   Number of children: Not on file   Years of education: Not on file   Highest education level: Not on file  Occupational History   Not on file  Tobacco Use   Smoking status: Never   Smokeless tobacco: Former    Types: Snuff   Tobacco comments:    quit 1980's  Vaping Use   Vaping status: Never Used  Substance and Sexual Activity   Alcohol use: Yes    Alcohol/week: 14.0 standard drinks of alcohol    Types: 14 Cans of beer per week    Comment: 1-2 beers daily    Drug use: Not Currently    Types: Marijuana    Comment: last time 2 years ago   Sexual activity: Yes    Partners: Male    Birth control/protection: None  Other Topics Concern   Not on file  Social History Narrative   Lives in [place] with his sister   Laid off from [employer].   Social Drivers of Corporate investment banker Strain: Not on file  Food Insecurity: Food Insecurity Present (06/22/2022)   Hunger Vital Sign    Worried About Running Out of Food in the Last Year: Sometimes true     Ran Out of Food in the Last Year: Sometimes true  Transportation Needs: Unmet Transportation Needs (06/22/2022)   PRAPARE - Administrator, Civil Service (Medical): Yes    Lack of Transportation (Non-Medical): Yes  Physical Activity: Not on file  Stress: Not on file  Social Connections: Not on file  Intimate Partner Violence: Not At Risk (06/22/2022)   Humiliation, Afraid, Rape, and Kick questionnaire    Fear of Current or Ex-Partner: No    Emotionally Abused: No    Physically Abused: No    Sexually Abused: No    Family History: Family History  Problem Relation Age of Onset   Hypertension Mother    Hypertension Sister    Hypertension Brother     Current Medications:  Current Outpatient Medications:    allopurinol (ZYLOPRIM) 300 MG tablet, Take 1 tablet by mouth once daily, Disp: 90 tablet, Rfl: 5   apixaban (ELIQUIS) 5 MG TABS tablet, Take 1 tablet (5 mg total) by mouth 2 (two) times daily., Disp: 180 tablet, Rfl: 3   cholecalciferol (VITAMIN D3) 10 MCG (400 UNIT) TABS tablet, Take 800 Units by mouth daily., Disp: , Rfl:    empagliflozin (JARDIANCE) 10 MG TABS tablet, Take 1 tablet (10 mg total) by mouth daily before breakfast., Disp: 30 tablet, Rfl: 11   famotidine (PEPCID) 20 MG tablet, Take 1 tablet (20 mg total) by mouth 2 (two) times daily., Disp: 18 tablet, Rfl: 0   furosemide (LASIX) 40 MG tablet, Take 1 tablet (40 mg total) by mouth daily., Disp: 90 tablet, Rfl: 1   metoprolol succinate (TOPROL-XL) 50 MG 24 hr tablet, TAKE 1 TABLET BY MOUTH ONCE DAILY WITH  OR  IMMEDIATELY  FOLLOWING  A  MEAL, Disp: 90 tablet, Rfl: 3   Multiple Vitamin (MULTIVITAMIN) capsule, Take 1 capsule by mouth daily., Disp: , Rfl:    potassium chloride SA (KLOR-CON) 20 MEQ tablet, Take 1 tablet (20 mEq total) by mouth 2 (two) times daily., Disp: 180 tablet, Rfl: 3   sacubitril-valsartan (ENTRESTO) 24-26 MG, Take 1 tablet by mouth 2 (two) times daily., Disp: 180 tablet, Rfl: 1  Allergies: Allergies  Allergen Reactions   Azithromycin Itching    REVIEW OF SYSTEMS:   Review of Systems  Respiratory:  Positive for cough and shortness of breath.   Psychiatric/Behavioral:  Positive for sleep disturbance.      VITALS:   Blood pressure 121/78, pulse 68, temperature 97.9 F (36.6 C), temperature source Oral, resp. rate 18, height 5\' 11"  (1.803 m), weight 223 lb (101.2 kg), SpO2 98%.  Wt Readings from Last 3 Encounters:  06/20/23 223 lb (101.2 kg)  12/19/22 225 lb 11.2 oz (102.4 kg)  08/03/22 223 lb 3.2 oz (101.2 kg)    Body mass index is 31.1 kg/m.  Performance status (ECOG): 1 - Symptomatic but completely ambulatory  PHYSICAL EXAM:   Physical Exam Constitutional:      Appearance: Normal appearance.  Cardiovascular:     Rate and Rhythm: Normal rate and regular rhythm.  Pulmonary:     Effort: Pulmonary effort is normal.     Breath sounds: Normal breath sounds.  Abdominal:     General: Bowel sounds are normal.     Palpations: Abdomen is soft.  Musculoskeletal:        General: No swelling. Normal range of motion.  Neurological:     Mental Status: He is alert and oriented to person, place, and time. Mental status is at baseline.     LABS:      Latest Ref Rng & Units 06/13/2023    9:01 AM 11/30/2022   10:57 AM 07/25/2022    8:06 AM  CBC  WBC 4.0 - 10.5 K/uL 6.6  5.7  6.1   Hemoglobin 13.0 - 17.0 g/dL 65.7  84.6  96.2   Hematocrit 39.0 - 52.0 % 46.1  45.4  40.6   Platelets 150 - 400 K/uL 402  413  336       Latest Ref Rng & Units 06/13/2023    9:01 AM 11/30/2022   10:57 AM 11/19/2022   12:19 PM  CMP  Glucose 70 - 99 mg/dL 952  841  97   BUN 8 - 23 mg/dL 19  20  21    Creatinine 0.61 - 1.24 mg/dL 3.24  4.01  0.27   Sodium 135 - 145 mmol/L 139  136  135   Potassium 3.5 - 5.1 mmol/L 4.0  3.7  4.2   Chloride 98 - 111 mmol/L 101  99  99   CO2 22 - 32 mmol/L 27  26  26    Calcium 8.9 - 10.3 mg/dL 9.1  8.8  9.0   Total Protein 6.5 - 8.1 g/dL 8.4   8.6    Total Bilirubin 0.0 - 1.2 mg/dL 0.4  0.8    Alkaline Phos 38 - 126 U/L 87  99    AST 15 - 41 U/L 15  16    ALT 0 - 44 U/L 14  17       No results found for: "CEA1", "CEA" / No results found for: "CEA1", "CEA" No results found for: "PSA1" No results found for: "CAN199" No results found for: "CAN125"  Lab Results  Component Value Date   TOTALPROTELP 7.7 06/13/2023   ALBUMINELP 3.4 06/13/2023   A1GS 0.2 06/13/2023   A2GS 0.7 06/13/2023   BETS 2.5 (H) 06/13/2023   GAMS 0.8 06/13/2023   MSPIKE 1.6 (H) 06/13/2023   SPEI Comment 06/13/2023   Lab Results  Component Value Date   TIBC 393 06/13/2023   TIBC 384 11/30/2022   TIBC 352 06/22/2022  FERRITIN 19 (L) 06/13/2023   FERRITIN 27 11/30/2022   FERRITIN 75 06/22/2022   IRONPCTSAT 11 (L) 06/13/2023   IRONPCTSAT 27 11/30/2022   IRONPCTSAT 24 06/22/2022   Lab Results  Component Value Date   LDH 105 11/30/2022   LDH 120 06/22/2022     STUDIES:   No results found.

## 2023-06-20 ENCOUNTER — Inpatient Hospital Stay (HOSPITAL_BASED_OUTPATIENT_CLINIC_OR_DEPARTMENT_OTHER): Payer: Medicaid Other | Admitting: Oncology

## 2023-06-20 VITALS — BP 121/78 | HR 68 | Temp 97.9°F | Resp 18 | Ht 71.0 in | Wt 223.0 lb

## 2023-06-20 DIAGNOSIS — D649 Anemia, unspecified: Secondary | ICD-10-CM | POA: Diagnosis not present

## 2023-06-20 DIAGNOSIS — C9000 Multiple myeloma not having achieved remission: Secondary | ICD-10-CM

## 2023-06-20 DIAGNOSIS — D472 Monoclonal gammopathy: Secondary | ICD-10-CM

## 2023-06-20 NOTE — Patient Instructions (Signed)
 Start Iron tablet (ferrous Gluconate) 324 mg tabs daily or every other day. Take with Vit C or with Orange Juice. Take at bedtime.   Need Bone Survey in the next couple of weeks.   Will call with results from Bone survey.   Labs are stable.   RTC in 6 months with labs and see provider.   Durenda Hurt, NP 06/20/2023 9:26 AM

## 2023-06-25 ENCOUNTER — Ambulatory Visit (HOSPITAL_COMMUNITY)
Admission: RE | Admit: 2023-06-25 | Discharge: 2023-06-25 | Disposition: A | Discharge status: Home / Self Care | Source: Ambulatory Visit | Attending: Hematology | Admitting: Hematology

## 2023-06-25 DIAGNOSIS — D472 Monoclonal gammopathy: Secondary | ICD-10-CM | POA: Insufficient documentation

## 2023-07-11 ENCOUNTER — Ambulatory Visit: Attending: Student | Admitting: Student

## 2023-07-11 ENCOUNTER — Encounter: Payer: Self-pay | Admitting: Student

## 2023-07-11 VITALS — BP 124/64 | HR 72 | Ht 71.0 in | Wt 227.0 lb

## 2023-07-11 DIAGNOSIS — I4819 Other persistent atrial fibrillation: Secondary | ICD-10-CM

## 2023-07-11 DIAGNOSIS — I77810 Thoracic aortic ectasia: Secondary | ICD-10-CM | POA: Diagnosis not present

## 2023-07-11 DIAGNOSIS — I5022 Chronic systolic (congestive) heart failure: Secondary | ICD-10-CM

## 2023-07-11 DIAGNOSIS — I351 Nonrheumatic aortic (valve) insufficiency: Secondary | ICD-10-CM

## 2023-07-11 DIAGNOSIS — I513 Intracardiac thrombosis, not elsewhere classified: Secondary | ICD-10-CM

## 2023-07-11 DIAGNOSIS — N1832 Chronic kidney disease, stage 3b: Secondary | ICD-10-CM

## 2023-07-11 NOTE — Progress Notes (Addendum)
 Cardiology Office Note    Date:  07/11/2023  ID:  David [name], DOB 1961/02/22, MRN [id number] Cardiologist: Nona Dell, MD    History of Present Illness:    David [name] is a 63 y.o. male  with past medical history of HFrEF/NICM (EF 15-20% by echo in 2018 with cath showing normal cors, EF 25-30% by echo in 11/2018 and 35% in 11/2019, EF 35-40% by echo in 01/2022), moderate AI, LAA thrombus (history of this but resolved by repeat imaging), persistent atrial fibrillation, HTN, MGUS and Stage 3 CKD who presents to the office today for overdue follow-up.   He was last examined by myself in 07/2022 and was using a cane for ambulation given recent knee replacement but denied any specific anginal symptoms. Appeared euvolemic on examination and was continued on Entresto 24-26 mg twice daily, Toprol-XL 50 mg daily and Lasix 40 mg daily. Was recommend that based off his BP trend, could further titrate Entresto or add [name] SGLT2 inhibitor if Nephrology was in agreement. A message was sent to Dr. Wolfgang Phoenix and he was ultimately started on Jardiance 10 mg daily.  In talking with the patient today, he reports overall feeling well since his last office visit. He does not exercise routinely but reports being active with his grandchildren. Denies any specific dyspnea on exertion or chest pain. Does report occasional palpitations but typically these are most notable when bringing in groceries as he has to carry them up 2 flights of stairs to his apartment. He denies any recent orthopnea, PND or lower extremity edema. Was able to obtain Jardiance from the [place] last year and has overall been tolerating this well.  Studies Reviewed:   EKG: EKG is ordered today and demonstrates:   EKG Interpretation Date/Time:  Thursday July 11 2023 15:20:45 EDT Ventricular Rate:  101 PR Interval:    QRS Duration:  110 QT Interval:  350 QTC Calculation: 453 R Axis:   44  Text Interpretation: Atrial fibrillation  T wave abnormality, consider inferior ischemia Confirmed by David [name] ([id number]) on 07/11/2023 3:35:44 PM       Echocardiogram: 01/2022 IMPRESSIONS     1. Left ventricular ejection fraction, by estimation, is 35 to 40%. The  left ventricle has moderately decreased function. The left ventricle  demonstrates global hypokinesis. The left ventricular internal cavity size  was mildly dilated. There is moderate   concentric left ventricular hypertrophy. Left ventricular diastolic  parameters are indeterminate.   2. Right ventricular systolic function is low normal. The right  ventricular size is normal. Tricuspid regurgitation signal is inadequate  for assessing PA pressure.   3. Left atrial size was moderately dilated.   4. Right atrial size was mildly dilated.   5. The mitral valve is abnormal. Mild mitral valve regurgitation.   6. The aortic valve is bicuspid. There is mild calcification of the  aortic valve. Aortic valve regurgitation is moderate. Aortic valve  sclerosis/calcification is present, without any evidence of aortic  stenosis. Aortic regurgitation PHT measures 705  msec.   7. Aortic dilatation noted. There is moderate dilatation of the aortic  root, measuring 48 mm.   8. The inferior vena cava is normal in size with greater than 50%  respiratory variability, suggesting right atrial pressure of 3 mmHg.   Comparison(s): Prior images reviewed side by side. LVEF 35-40% range and  RV contraction low normal. Bicuspid aortic valve without stenosis. There  is stable, moderate aortic regurgitation. Aortic root is moderately  dilated at 48 mm.    Risk Assessment/Calculations:    CHA2DS2-VASc Score = 4   This indicates a 4.8% annual risk of stroke. The patient's score is based upon: CHF History: 1 HTN History: 1 Diabetes History: 0 Stroke History: 2 - TIA Vascular Disease History: 0 Age Score: 0 Gender Score: 0    Physical Exam:   VS:  BP 124/64   Pulse 72    Ht 5\' 11"  (1.803 m)   Wt 227 lb (103 kg)   SpO2 96%   BMI 31.66 kg/m    Wt Readings from Last 3 Encounters:  07/11/23 227 lb (103 kg)  06/20/23 223 lb (101.2 kg)  12/19/22 225 lb 11.2 oz (102.4 kg)     GEN: Well nourished, well developed male appearing in no acute distress NECK: No JVD; No carotid bruits CARDIAC: Irregularly irregular, 2/6 diastolic murmur along RUSB.  RESPIRATORY:  Clear to auscultation without rales, wheezing or rhonchi  ABDOMEN: Appears non-distended. No obvious abdominal masses. EXTREMITIES: No clubbing or cyanosis. No pitting edema.  Distal pedal pulses are 2+ bilaterally.   Assessment and Plan:   1. Chronic HFrEF/NICM - Most recent echocardiogram in 01/2022 showed his EF had improved to 35 to 40%. He denies any recent respiratory issues and appears euvolemic by examination today. Will plan for a follow-up echocardiogram for reassessment of his aortic regurgitation and aortic root but will also allow for reassessment of his EF. - Continue current GDMT with Jardiance 10 mg daily, Entresto 24-26 mg twice daily, Lasix 40 mg daily and Toprol-XL 50 mg daily. He has not been on Spironolactone due to his variable renal function (has actually improved over the past year) but could consider a trial of this in the future based off follow-up echocardiogram results.  2. Aortic Regurgitation/Dilatation of Aortic Root - Echo in 01/2022 showed moderate AI and he did have moderate dilatation aortic root at 48 mm. Will plan for a follow-up echocardiogram for reassessment.   3. Persistent Atrial Fibrillation - HR is well-controlled in the 70's on recheck and he reports this has been well-controlled when checked at home. Continue Toprol-XL 50mg  daily for rate-control.  - Remains on Eliquis 5 mg twice daily for anticoagulation which is the appropriate dose given his age, weight and renal function. CBC in 06/2023 showed his hemoglobin was stable at 14.6 with platelets at 402K.  4.  History of LAA Thrombus - This has resolved by repeat imaging. He is on Eliquis for atrial fibrillation as discussed above.  5. Stage 3 CKD - Followed by Dr. Wolfgang Phoenix. Creatinine was stable at 1.44 when checked in 06/2023 which is close to his known baseline.   Signed, Ellsworth Lennox, PA-C

## 2023-07-11 NOTE — Patient Instructions (Addendum)
 Medication Instructions:  Your physician recommends that you continue on your current medications as directed. Please refer to the Current Medication list given to you today.  *If you need a refill on your cardiac medications before your next appointment, please call your pharmacy*  Lab Work: None If you have labs (blood work) drawn today and your tests are completely normal, you will receive your results only by: MyChart Message (if you have MyChart) OR A paper copy in the mail If you have any lab test that is abnormal or we need to change your treatment, we will call you to review the results.  Testing/Procedures: Your physician has requested that you have an echocardiogram. Echocardiography is a painless test that uses sound waves to create images of your heart. It provides your doctor with information about the size and shape of your heart and how well your heart's chambers and valves are working. This procedure takes approximately one hour. There are no restrictions for this procedure. Please do NOT wear cologne, perfume, aftershave, or lotions (deodorant is allowed). Please arrive 15 minutes prior to your appointment time.  Please note: We ask at that you not bring children with you during ultrasound (echo/ vascular) testing. Due to room size and safety concerns, children are not allowed in the ultrasound rooms during exams. Our front office staff cannot provide observation of children in our lobby area while testing is being conducted. An adult accompanying a patient to their appointment will only be allowed in the ultrasound room at the discretion of the ultrasound technician under special circumstances. We apologize for any inconvenience.    Follow-Up: At [facility], you and your health needs are our priority.  As part of our continuing mission to provide you with exceptional heart care, our providers are all part of one team.  This team includes your primary Cardiologist  (physician) and Advanced Practice Providers or APPs (Physician Assistants and Nurse Practitioners) who all work together to provide you with the care you need, when you need it.  Your next appointment:   6 month(s)  Provider:   You may see Nona Dell, MD or one of the following Advanced Practice Providers on your designated Care Team:   Randall An, PA-C  [place], [place] Jacolyn Reedy, [place]     We recommend signing up for the patient portal called "MyChart".  Sign up information is provided on this After Visit Summary.  MyChart is used to connect with patients for Virtual Visits (Telemedicine).  Patients are able to view lab/test results, encounter notes, upcoming appointments, etc.  Non-urgent messages can be sent to your provider as well.   To learn more about what you can do with MyChart, go to [email or web address].   Other Instructions

## 2023-08-15 ENCOUNTER — Ambulatory Visit (HOSPITAL_COMMUNITY): Admission: RE | Admit: 2023-08-15 | Source: Ambulatory Visit

## 2023-09-24 ENCOUNTER — Ambulatory Visit: Payer: Self-pay | Admitting: Student

## 2023-09-24 ENCOUNTER — Ambulatory Visit (HOSPITAL_COMMUNITY)
Admission: RE | Admit: 2023-09-24 | Discharge: 2023-09-24 | Disposition: A | Discharge status: Home / Self Care | Source: Ambulatory Visit | Attending: Student | Admitting: Student

## 2023-09-24 DIAGNOSIS — I5022 Chronic systolic (congestive) heart failure: Secondary | ICD-10-CM

## 2023-09-24 DIAGNOSIS — I77810 Thoracic aortic ectasia: Secondary | ICD-10-CM | POA: Diagnosis not present

## 2023-09-24 LAB — ECHOCARDIOGRAM COMPLETE
AR max vel: 3.09 cm2
AV Area VTI: 3.69 cm2
AV Area mean vel: 3.25 cm2
AV Mean grad: 6 mmHg
AV Peak grad: 10.7 mmHg
Ao pk vel: 1.64 m/s
Area-P 1/2: 3.63 cm2
Calc EF: 40.1 %
MV M vel: 4.2 m/s
MV Peak grad: 70.6 mmHg
P 1/2 time: 858 ms
S' Lateral: 4.6 cm
Single Plane A2C EF: 34.3 %
Single Plane A4C EF: 48.4 %

## 2023-09-24 NOTE — Progress Notes (Signed)
*  PRELIMINARY RESULTS* Echocardiogram 2D Echocardiogram has been performed.  Glenna Lango 09/24/2023, 10:07 AM

## 2023-12-20 ENCOUNTER — Inpatient Hospital Stay: Attending: General Practice

## 2023-12-27 ENCOUNTER — Inpatient Hospital Stay: Admitting: Oncology

## 2023-12-27 DIAGNOSIS — D649 Anemia, unspecified: Secondary | ICD-10-CM | POA: Insufficient documentation

## 2023-12-27 NOTE — Assessment & Plan Note (Deleted)
 Denies any new onset pains. - Labs from 06/13/2023 show stable M spike 1.6 (1.5).  Lambda light chains are two 9.9 (186.6) with a kappa lambda light chain ratio of 0.10 0.09).  Slight elevation in kappa free light chains at 20.7 (17.5).  Hemoglobin is stable at 14.6.  Calcium  9.1 (8.8).  Creatinine has improved from previous at 1.44 (1.86). -Most recent bone survey 06/22/2022 which showed several small lucencies in proximal right and left humerus, spine, femur and tibia.  Prominent lucency in left clavicle.  Faint small lucency noted in the skull.  Findings suggest multiple myeloma and/or metastatic disease. -There is no indication for treatment at this time. -Recommend stat bone survey and return to clinic in 6 months with labs a few days before.

## 2023-12-27 NOTE — Assessment & Plan Note (Deleted)
-   Likely from combination of CKD and functional iron deficiency. - Creatinine has improved from previous visit.  He is followed by Dr. Rachele. -Hemoglobin is stable.  Ferritin is slightly decreased from previous and is down to 19 with an iron saturation of 11%. -She was recently started on iron. -She denies any bleeding, melena or hematochezia.  No prior colonoscopy.

## 2023-12-27 NOTE — Progress Notes (Deleted)
 [facility] OFFICE PROGRESS NOTE  [facility]  ASSESSMENT & PLAN:    Assessment & Plan IgG lambda MGUS  Denies any new onset pains. - Labs from 06/13/2023 show stable M spike 1.6 (1.5).  Lambda light chains are two 9.9 (186.6) with a kappa lambda light chain ratio of 0.10 0.09).  Slight elevation in kappa free light chains at 20.7 (17.5).  Hemoglobin is stable at 14.6.  Calcium  9.1 (8.8).  Creatinine has improved from previous at 1.44 (1.86). -Most recent bone survey 06/22/2022 which showed several small lucencies in proximal right and left humerus, spine, femur and tibia.  Prominent lucency in left clavicle.  Faint small lucency noted in the skull.  Findings suggest multiple myeloma and/or metastatic disease. -There is no indication for treatment at this time. -Recommend stat bone survey and return to clinic in 6 months with labs a few days before. 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 - Creatinine has improved from previous visit.  He is followed by Dr. Rachele. -Hemoglobin is stable.  Ferritin is slightly decreased from previous and is down to 19 with an iron saturation of 11%. -She was recently started on iron. -She denies any bleeding, melena or hematochezia.  No prior colonoscopy.   No orders of the defined types were placed in this encounter.   INTERVAL HISTORY: Patient returns for follow-up.  We reviewed ***  SUMMARY OF HEMATOLOGIC HISTORY: Oncology History   No history exists.   1.  IgG lambda monoclonal gammopathy: - Patient seen at the request of Dr. Rachele - 01/01/2022: SPEP-0.3 g M spike, kappa-19.2, lambda-163.4, ratio 0.12.  IFE-IgG lambda.  24-hour urine IFE-IgG lambda.  24-hour urine protein 84 mg.  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 - Skeletal survey (06/22/2022): Concerning for multiple myeloma versus metastatic disease with lucencies to  the skull, proximal right humerus, left humerus, left clavicle, lower thoracic vertebral body, subcapital region of the left femur, right tibial diaphysis. - Progressive back pain for the past several months and right-sided knee pain (s/p right TKR in November 2023). Mild fatigue.  No B symptoms or neuropathy. - BMBX (07/25/2022): Hypercellular marrow with 9% plasma cells.  Cytogenetics and FISH panel were normal. - PET scan on 07/12/2022: No evidence of myeloma.   2.  Social/family history: - Lives at home with his wife.  He is currently disabled.  Previously worked at a [employer].  Non-smoker. - Maternal uncle had lung cancer.  No family history of multiple myeloma.  CBC    Component Value Date/Time   WBC 6.6 06/13/2023 0901   RBC 4.74 06/13/2023 0901   HGB 14.6 06/13/2023 0901   HCT 46.1 06/13/2023 0901   PLT 402 (H) 06/13/2023 0901   MCV 97.3 06/13/2023 0901   MCH 30.8 06/13/2023 0901   MCHC 31.7 06/13/2023 0901   RDW 13.9 06/13/2023 0901   LYMPHSABS 2.4 06/13/2023 0901   MONOABS 0.9 06/13/2023 0901   EOSABS 0.2 06/13/2023 0901   BASOSABS 0.0 06/13/2023 0901       Latest Ref Rng & Units 06/13/2023    9:01 AM 11/30/2022   10:57 AM 11/19/2022   12:19 PM  CMP  Glucose 70 - 99 mg/dL 865  873  97   BUN 8 - 23 mg/dL 19  20  21    Creatinine 0.61 - 1.24 mg/dL 8.55  8.13  8.70   Sodium 135 - 145 mmol/L 139  136  135   Potassium 3.5 - 5.1 mmol/L 4.0  3.7  4.2   Chloride 98 - 111 mmol/L 101  99  99   CO2 22 - 32 mmol/L 27  26  26    Calcium  8.9 - 10.3 mg/dL 9.1  8.8  9.0   Total Protein 6.5 - 8.1 g/dL 8.4  8.6    Total Bilirubin 0.0 - 1.2 mg/dL 0.4  0.8    Alkaline Phos 38 - 126 U/L 87  99    AST 15 - 41 U/L 15  16    ALT 0 - 44 U/L 14  17       Lab Results  Component Value Date   FERRITIN 19 (L) 06/13/2023    There were no vitals filed for this visit.  Review of System:  ROS  Physical Exam: Physical Exam   I spent *** minutes dedicated to the care of this patient  (face-to-face and non-face-to-face) on the date of the encounter to include what is described in the assessment and plan.,  Delon Hope, NP 12/27/2023 8:33 AM

## 2024-01-07 ENCOUNTER — Ambulatory Visit: Admitting: Cardiology
